# Patient Record
Sex: Female | Born: 1980 | Race: Black or African American | Hispanic: No | Marital: Married | Smoking: Never smoker
Health system: Southern US, Community
[De-identification: ages and names within clinical notes are randomized; demographics above are authoritative.]

## PROBLEM LIST (undated history)

## (undated) DIAGNOSIS — I639 Cerebral infarction, unspecified: Secondary | ICD-10-CM

## (undated) DIAGNOSIS — G43019 Migraine without aura, intractable, without status migrainosus: Secondary | ICD-10-CM

## (undated) DIAGNOSIS — E785 Hyperlipidemia, unspecified: Secondary | ICD-10-CM

## (undated) DIAGNOSIS — T7840XA Allergy, unspecified, initial encounter: Secondary | ICD-10-CM

## (undated) DIAGNOSIS — F419 Anxiety disorder, unspecified: Secondary | ICD-10-CM

## (undated) DIAGNOSIS — E559 Vitamin D deficiency, unspecified: Secondary | ICD-10-CM

## (undated) HISTORY — DX: Vitamin D deficiency, unspecified: E55.9

## (undated) HISTORY — DX: Anxiety disorder, unspecified: F41.9

## (undated) HISTORY — DX: Hyperlipidemia, unspecified: E78.5

## (undated) HISTORY — PX: TUBAL LIGATION: SHX77

## (undated) HISTORY — DX: Allergy, unspecified, initial encounter: T78.40XA

## (undated) HISTORY — DX: Migraine without aura, intractable, without status migrainosus: G43.019

## (undated) HISTORY — DX: Cerebral infarction, unspecified: I63.9

---

## 2004-03-08 ENCOUNTER — Emergency Department (HOSPITAL_COMMUNITY): Admission: EM | Admit: 2004-03-08 | Discharge: 2004-03-09 | Payer: Self-pay | Admitting: *Deleted

## 2010-04-17 ENCOUNTER — Emergency Department (HOSPITAL_COMMUNITY)
Admission: EM | Admit: 2010-04-17 | Discharge: 2010-04-17 | Disposition: A | Discharge status: Home / Self Care | Payer: Medicaid Other | Attending: Emergency Medicine | Admitting: Emergency Medicine

## 2010-04-17 DIAGNOSIS — K5289 Other specified noninfective gastroenteritis and colitis: Secondary | ICD-10-CM | POA: Insufficient documentation

## 2010-04-17 DIAGNOSIS — E860 Dehydration: Secondary | ICD-10-CM | POA: Insufficient documentation

## 2010-04-17 DIAGNOSIS — R197 Diarrhea, unspecified: Secondary | ICD-10-CM | POA: Insufficient documentation

## 2010-04-17 DIAGNOSIS — R112 Nausea with vomiting, unspecified: Secondary | ICD-10-CM | POA: Insufficient documentation

## 2010-04-17 DIAGNOSIS — B9789 Other viral agents as the cause of diseases classified elsewhere: Secondary | ICD-10-CM | POA: Insufficient documentation

## 2010-04-17 LAB — DIFFERENTIAL
Basophils Relative: 0 % (ref 0–1)
Eosinophils Absolute: 0 10*3/uL (ref 0.0–0.7)
Eosinophils Relative: 0 % (ref 0–5)
Monocytes Absolute: 0.4 10*3/uL (ref 0.1–1.0)
Monocytes Relative: 3 % (ref 3–12)
Neutrophils Relative %: 93 % — ABNORMAL HIGH (ref 43–77)

## 2010-04-17 LAB — COMPREHENSIVE METABOLIC PANEL
Albumin: 4 g/dL (ref 3.5–5.2)
BUN: 13 mg/dL (ref 6–23)
Creatinine, Ser: 0.79 mg/dL (ref 0.4–1.2)
Total Protein: 7.1 g/dL (ref 6.0–8.3)

## 2010-04-17 LAB — URINALYSIS, ROUTINE W REFLEX MICROSCOPIC
Glucose, UA: NEGATIVE mg/dL
Hgb urine dipstick: NEGATIVE
Specific Gravity, Urine: 1.02 (ref 1.005–1.030)
Urobilinogen, UA: 0.2 mg/dL (ref 0.0–1.0)

## 2010-04-17 LAB — CBC
MCH: 31.2 pg (ref 26.0–34.0)
MCHC: 34.2 g/dL (ref 30.0–36.0)
Platelets: 197 10*3/uL (ref 150–400)
RDW: 14.4 % (ref 11.5–15.5)

## 2010-04-17 LAB — POCT PREGNANCY, URINE: Preg Test, Ur: NEGATIVE

## 2010-06-23 ENCOUNTER — Emergency Department (HOSPITAL_COMMUNITY)
Admission: EM | Admit: 2010-06-23 | Discharge: 2010-06-23 | Disposition: A | Discharge status: Home / Self Care | Payer: Medicaid Other | Attending: Emergency Medicine | Admitting: Emergency Medicine

## 2010-06-23 ENCOUNTER — Emergency Department (HOSPITAL_COMMUNITY): Payer: Medicaid Other

## 2010-06-23 DIAGNOSIS — M549 Dorsalgia, unspecified: Secondary | ICD-10-CM | POA: Insufficient documentation

## 2010-06-23 DIAGNOSIS — O99891 Other specified diseases and conditions complicating pregnancy: Secondary | ICD-10-CM | POA: Insufficient documentation

## 2010-06-23 LAB — HCG, SERUM, QUALITATIVE: Preg, Serum: POSITIVE — AB

## 2010-06-23 LAB — HCG, QUANTITATIVE, PREGNANCY: hCG, Beta Chain, Quant, S: 12355 m[IU]/mL — ABNORMAL HIGH (ref <5)

## 2010-06-23 LAB — CBC
MCV: 91 fL (ref 78.0–100.0)
Platelets: 189 10*3/uL (ref 150–400)
RBC: 3.78 MIL/uL — ABNORMAL LOW (ref 3.87–5.11)
RDW: 13 % (ref 11.5–15.5)
WBC: 5.2 10*3/uL (ref 4.0–10.5)

## 2010-06-23 LAB — URINALYSIS, ROUTINE W REFLEX MICROSCOPIC
Glucose, UA: NEGATIVE mg/dL
Nitrite: NEGATIVE
Protein, ur: NEGATIVE mg/dL
pH: 8 (ref 5.0–8.0)

## 2010-06-23 LAB — WET PREP, GENITAL
Trich, Wet Prep: NONE SEEN
Yeast Wet Prep HPF POC: NONE SEEN

## 2010-06-23 LAB — DIFFERENTIAL
Basophils Absolute: 0 10*3/uL (ref 0.0–0.1)
Eosinophils Absolute: 0.1 10*3/uL (ref 0.0–0.7)
Lymphs Abs: 2 10*3/uL (ref 0.7–4.0)
Neutrophils Relative %: 53 % (ref 43–77)

## 2010-06-23 IMAGING — US US OB TRANSVAGINAL
1 series · 14 of 28 positions shown · non-contrast
Comparison: None.

CLINICAL DATA: Back pain.  IUP. Intrauterine pregnancy.

OBSTETRIC <14 WK US AND TRANSVAGINAL OB US
TECHNIQUE: Both transabdominal and transvaginal ultrasound
examinations were performed for complete evaluation of the
gestation as well as the maternal uterus, adnexal regions, and
pelvic cul-de-sac.  Transvaginal technique was performed to assess
early pregnancy.

[Series 1: us ob transvaginal · 0.21mm/px · 14 of 87 slices shown]
[im 4/87]
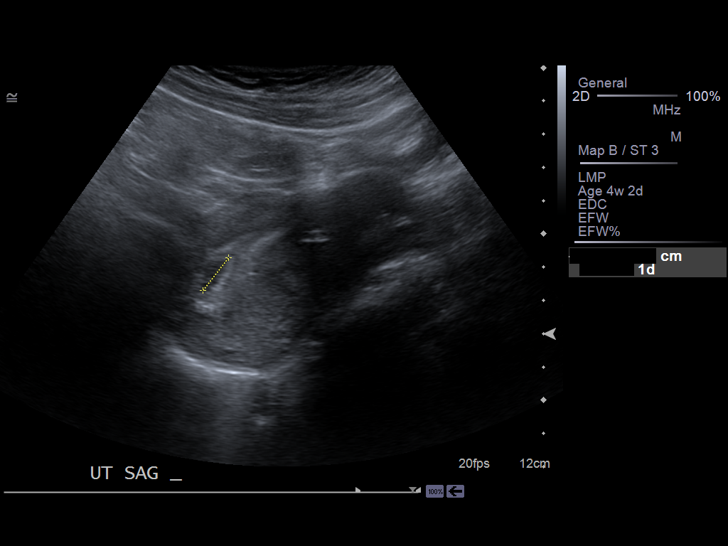
[im 10/87]
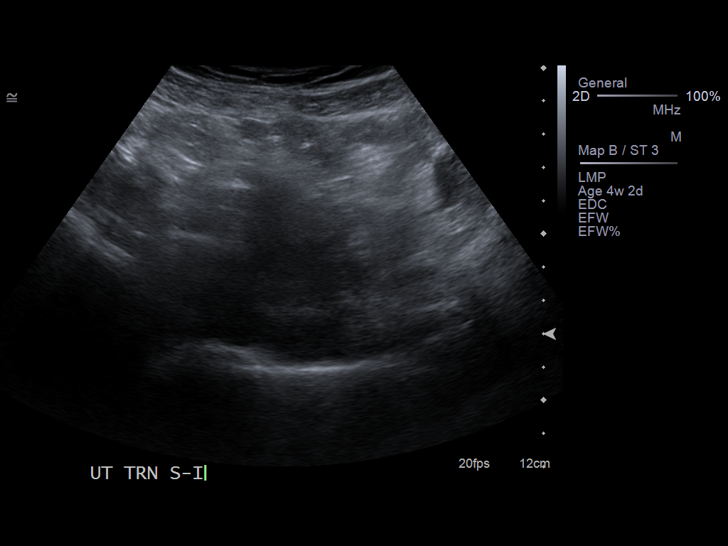
[im 16/87]
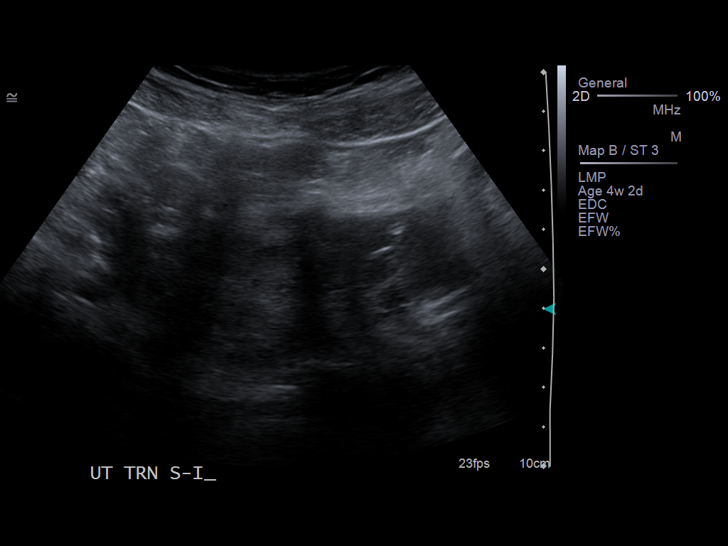
[im 23/87]
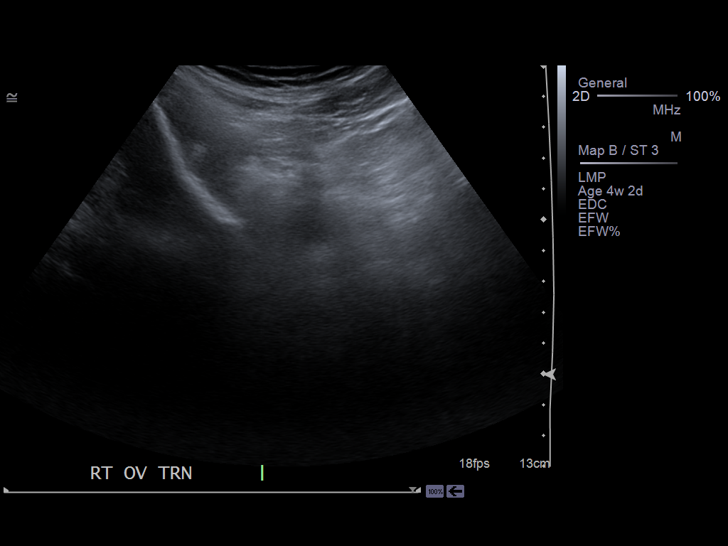
[im 29/87]
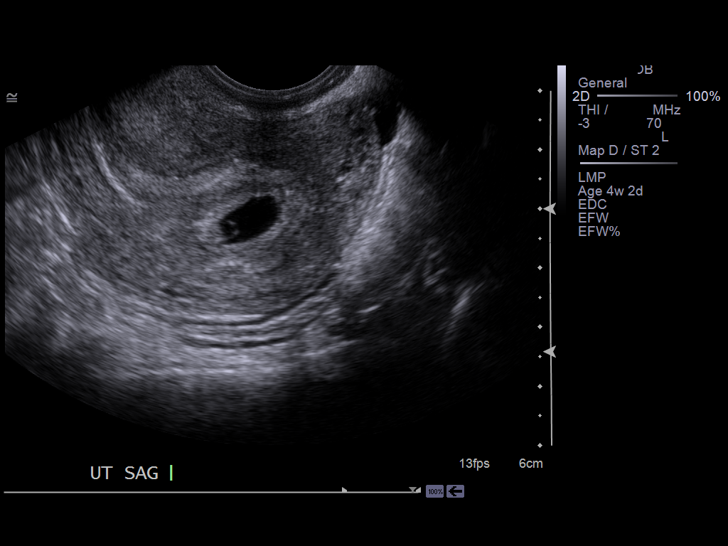
[im 36/87]
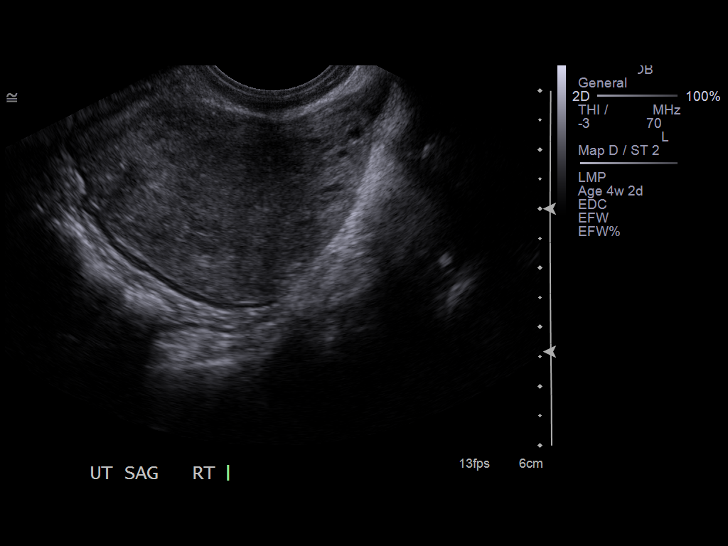
[im 42/87]
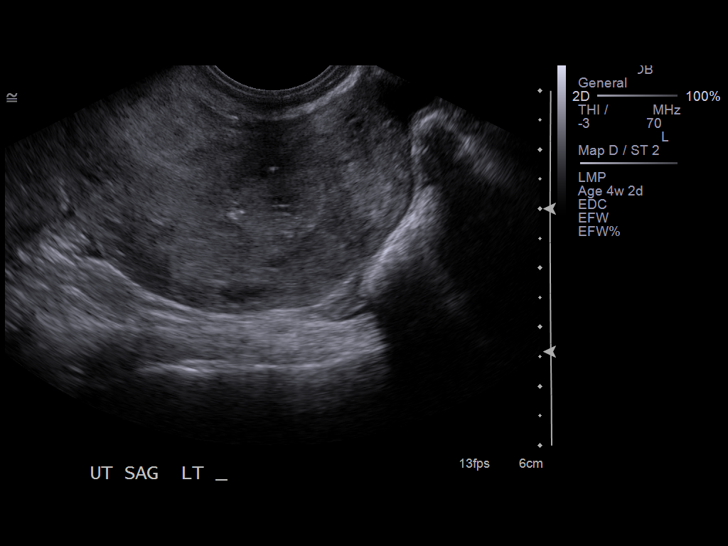
[im 48/87]
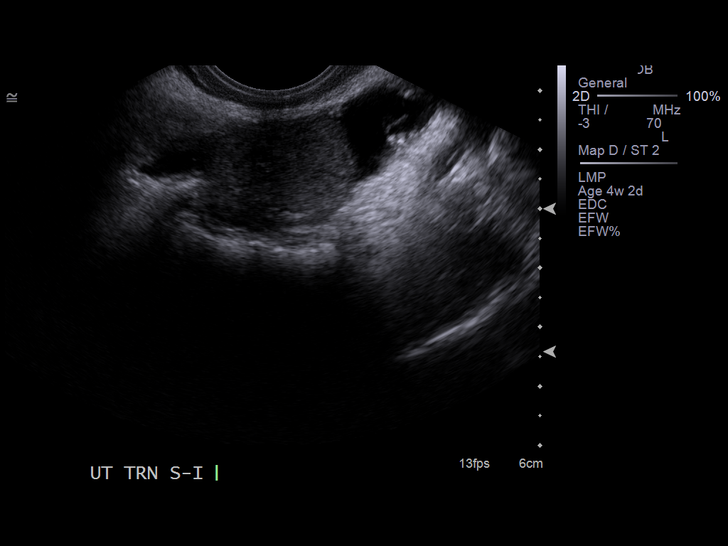
[im 55/87]
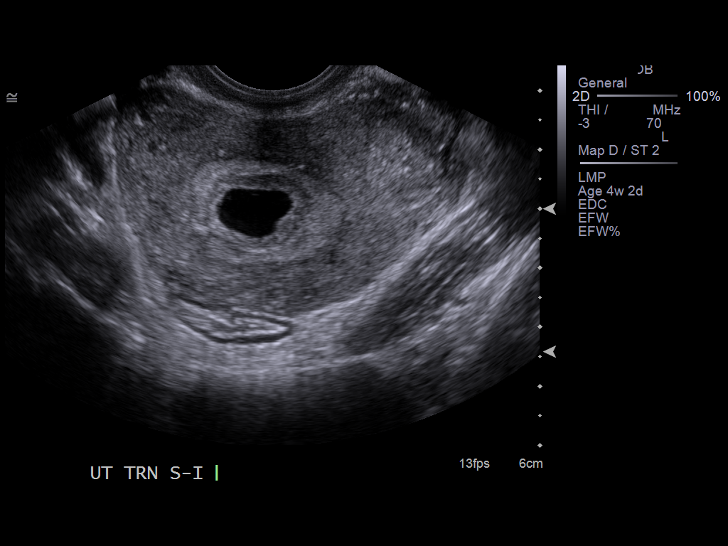
[im 61/87]
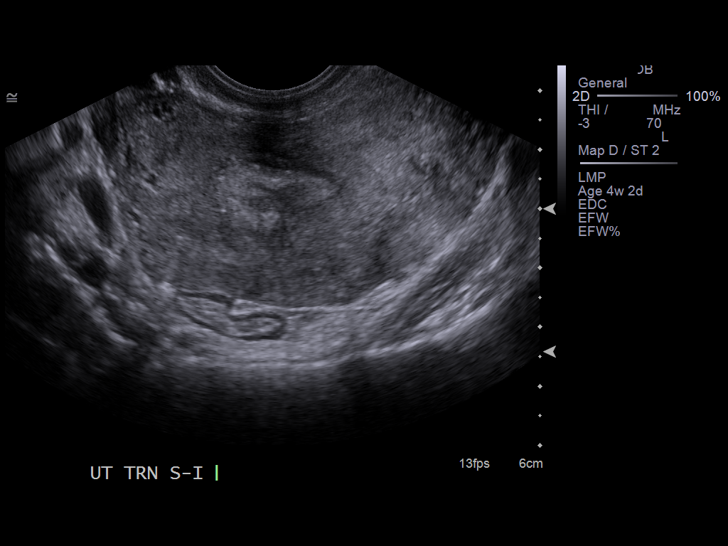
[im 67/87]
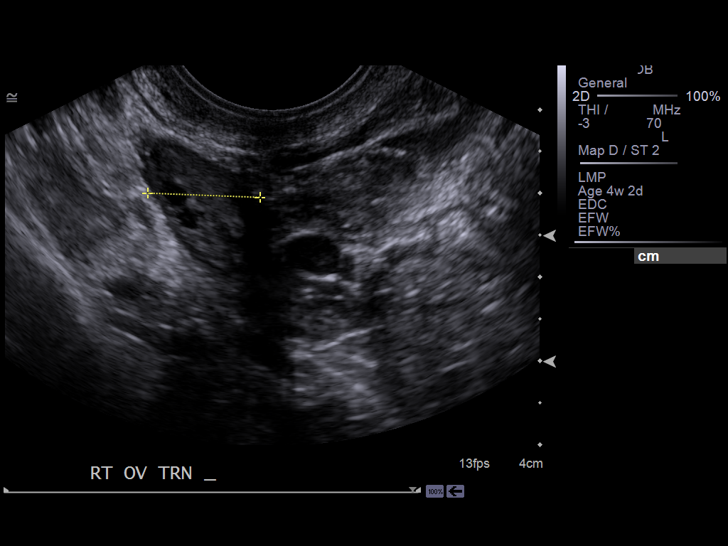
[im 74/87]
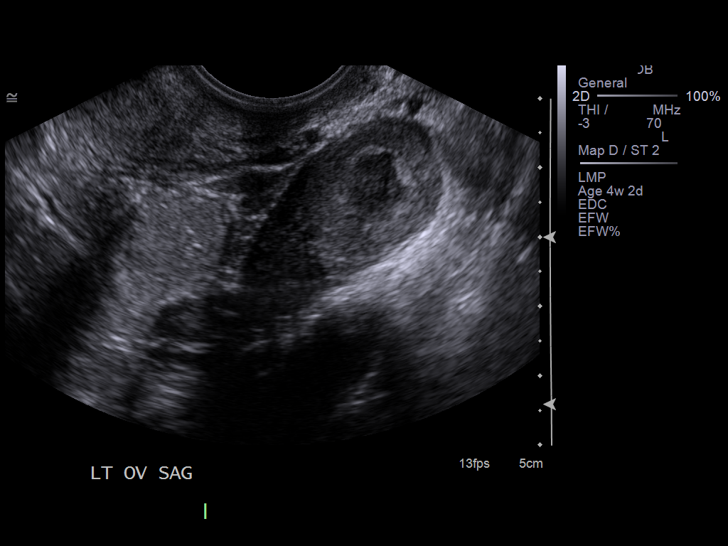
[im 80/87]
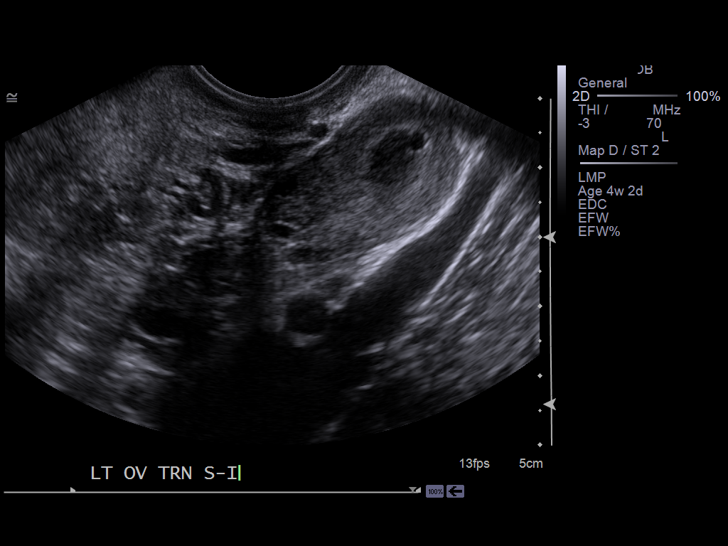
[im 87/87]
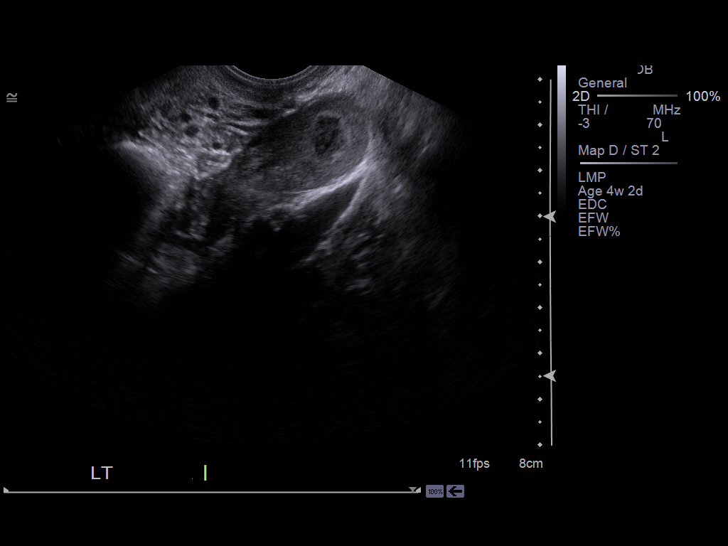

[14 of 28 positions shown; findings below may reference images not displayed]

Intrauterine gestational sac:  Visualized/normal in shape.
Yolk sac: Present
Embryo: Not visualized
Cardiac Activity: Not visualized
Heart Rate: Not applicable bpm

MSD: 1.2 cm.  mm  6    w zero    d
CRL:    mm     w     d        US EDC: [DATE]

Maternal uterus/adnexae:
Right ovary measures 26 mm x 10 mm x 13 mm with normal color flow.
Left ovary measures 32 mm x 19 mm x 27 mm.  Corpus luteum cyst
noted measuring 12 mm.

Small amount of free fluid present in the anatomic pelvis.
IMPRESSION: Intrauterine gestational sac with yolk sac present compatible with
early intrauterine pregnancy.  Differential considerations include
early normal or abnormal IUP.

## 2010-06-24 LAB — GC/CHLAMYDIA PROBE AMP, GENITAL
Chlamydia, DNA Probe: NEGATIVE
GC Probe Amp, Genital: NEGATIVE

## 2011-01-07 ENCOUNTER — Emergency Department (HOSPITAL_COMMUNITY): Payer: Medicaid Other

## 2011-01-07 ENCOUNTER — Encounter: Payer: Self-pay | Admitting: *Deleted

## 2011-01-07 ENCOUNTER — Emergency Department (HOSPITAL_COMMUNITY)
Admission: EM | Admit: 2011-01-07 | Discharge: 2011-01-07 | Disposition: A | Discharge status: Home / Self Care | Payer: Medicaid Other | Attending: Emergency Medicine | Admitting: Emergency Medicine

## 2011-01-07 DIAGNOSIS — O99891 Other specified diseases and conditions complicating pregnancy: Secondary | ICD-10-CM | POA: Insufficient documentation

## 2011-01-07 DIAGNOSIS — R509 Fever, unspecified: Secondary | ICD-10-CM | POA: Insufficient documentation

## 2011-01-07 DIAGNOSIS — R05 Cough: Secondary | ICD-10-CM | POA: Insufficient documentation

## 2011-01-07 DIAGNOSIS — J111 Influenza due to unidentified influenza virus with other respiratory manifestations: Secondary | ICD-10-CM | POA: Insufficient documentation

## 2011-01-07 DIAGNOSIS — IMO0001 Reserved for inherently not codable concepts without codable children: Secondary | ICD-10-CM | POA: Insufficient documentation

## 2011-01-07 DIAGNOSIS — R110 Nausea: Secondary | ICD-10-CM | POA: Insufficient documentation

## 2011-01-07 DIAGNOSIS — R059 Cough, unspecified: Secondary | ICD-10-CM | POA: Insufficient documentation

## 2011-01-07 DIAGNOSIS — R6889 Other general symptoms and signs: Secondary | ICD-10-CM | POA: Insufficient documentation

## 2011-01-07 IMAGING — CR DG CHEST 2V
2 series · 2 of 2 positions shown · non-contrast
Comparison: None.

CLINICAL DATA: Fever, cough.

CHEST - 2 VIEW

[view not recorded (1 of 2)]
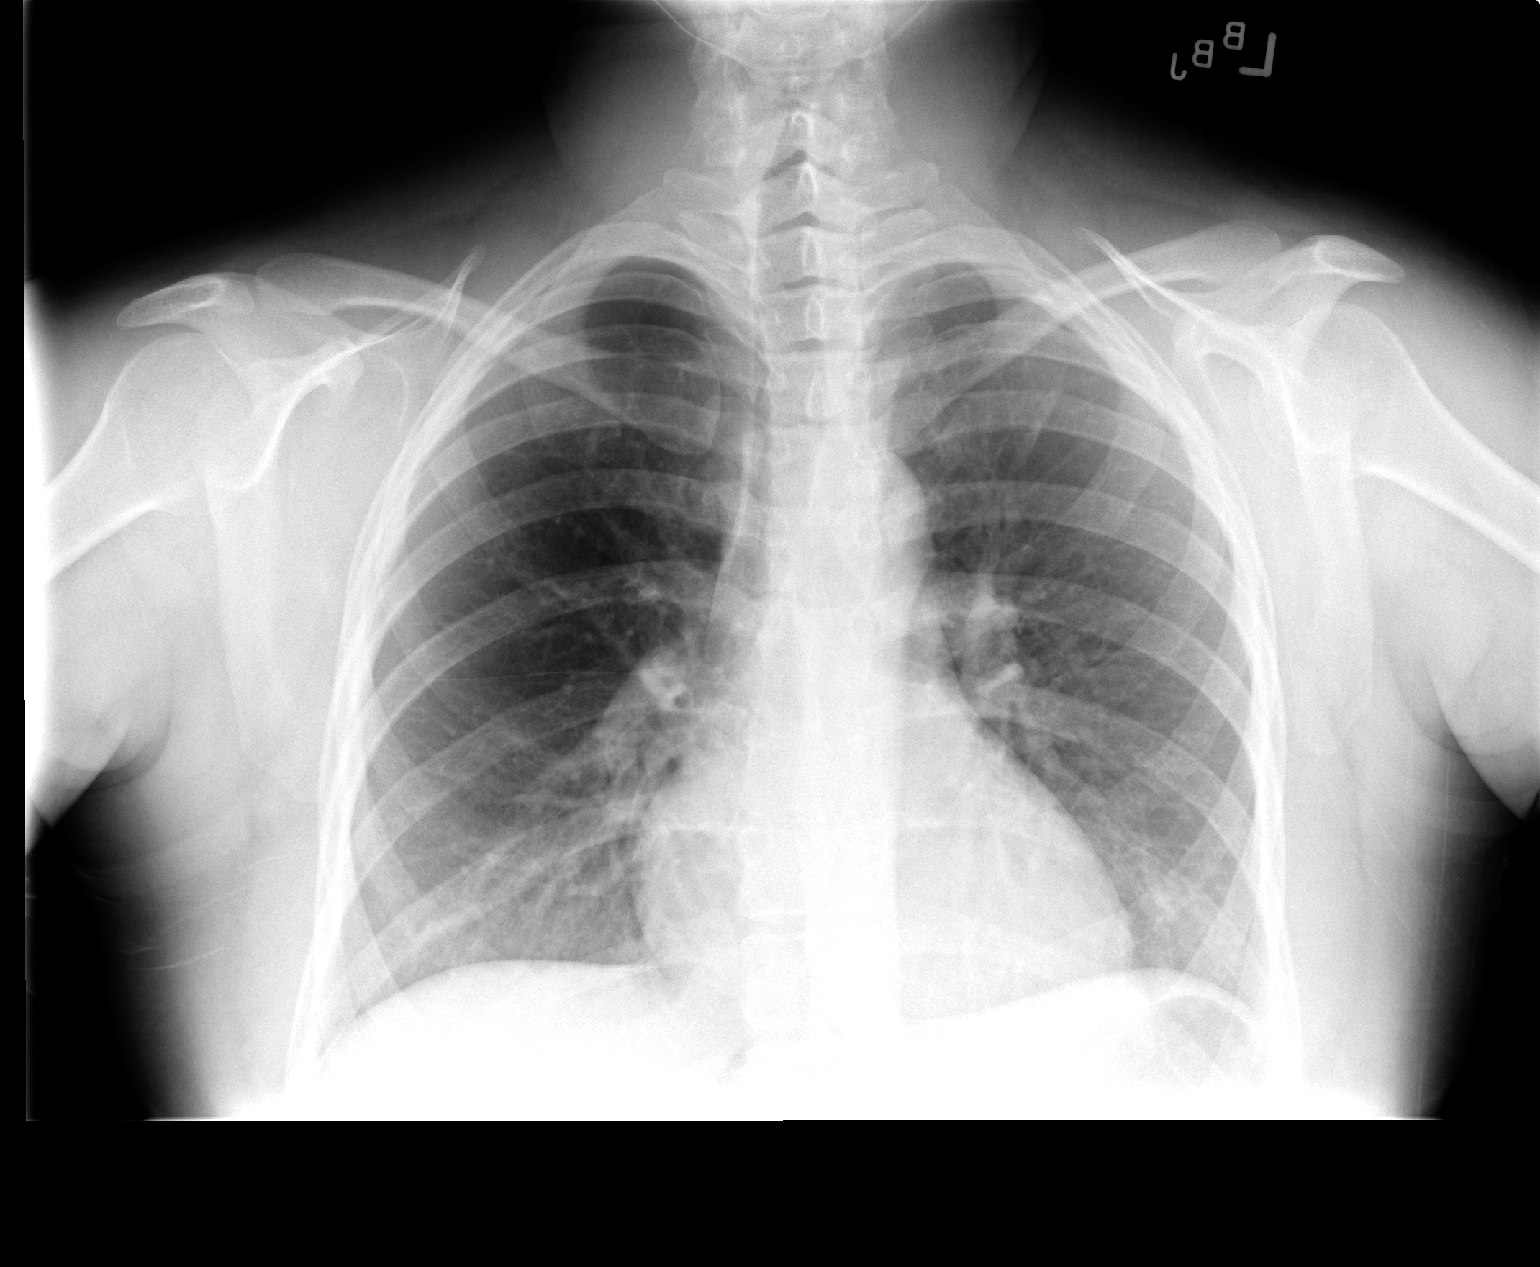

[view not recorded (2 of 2)]
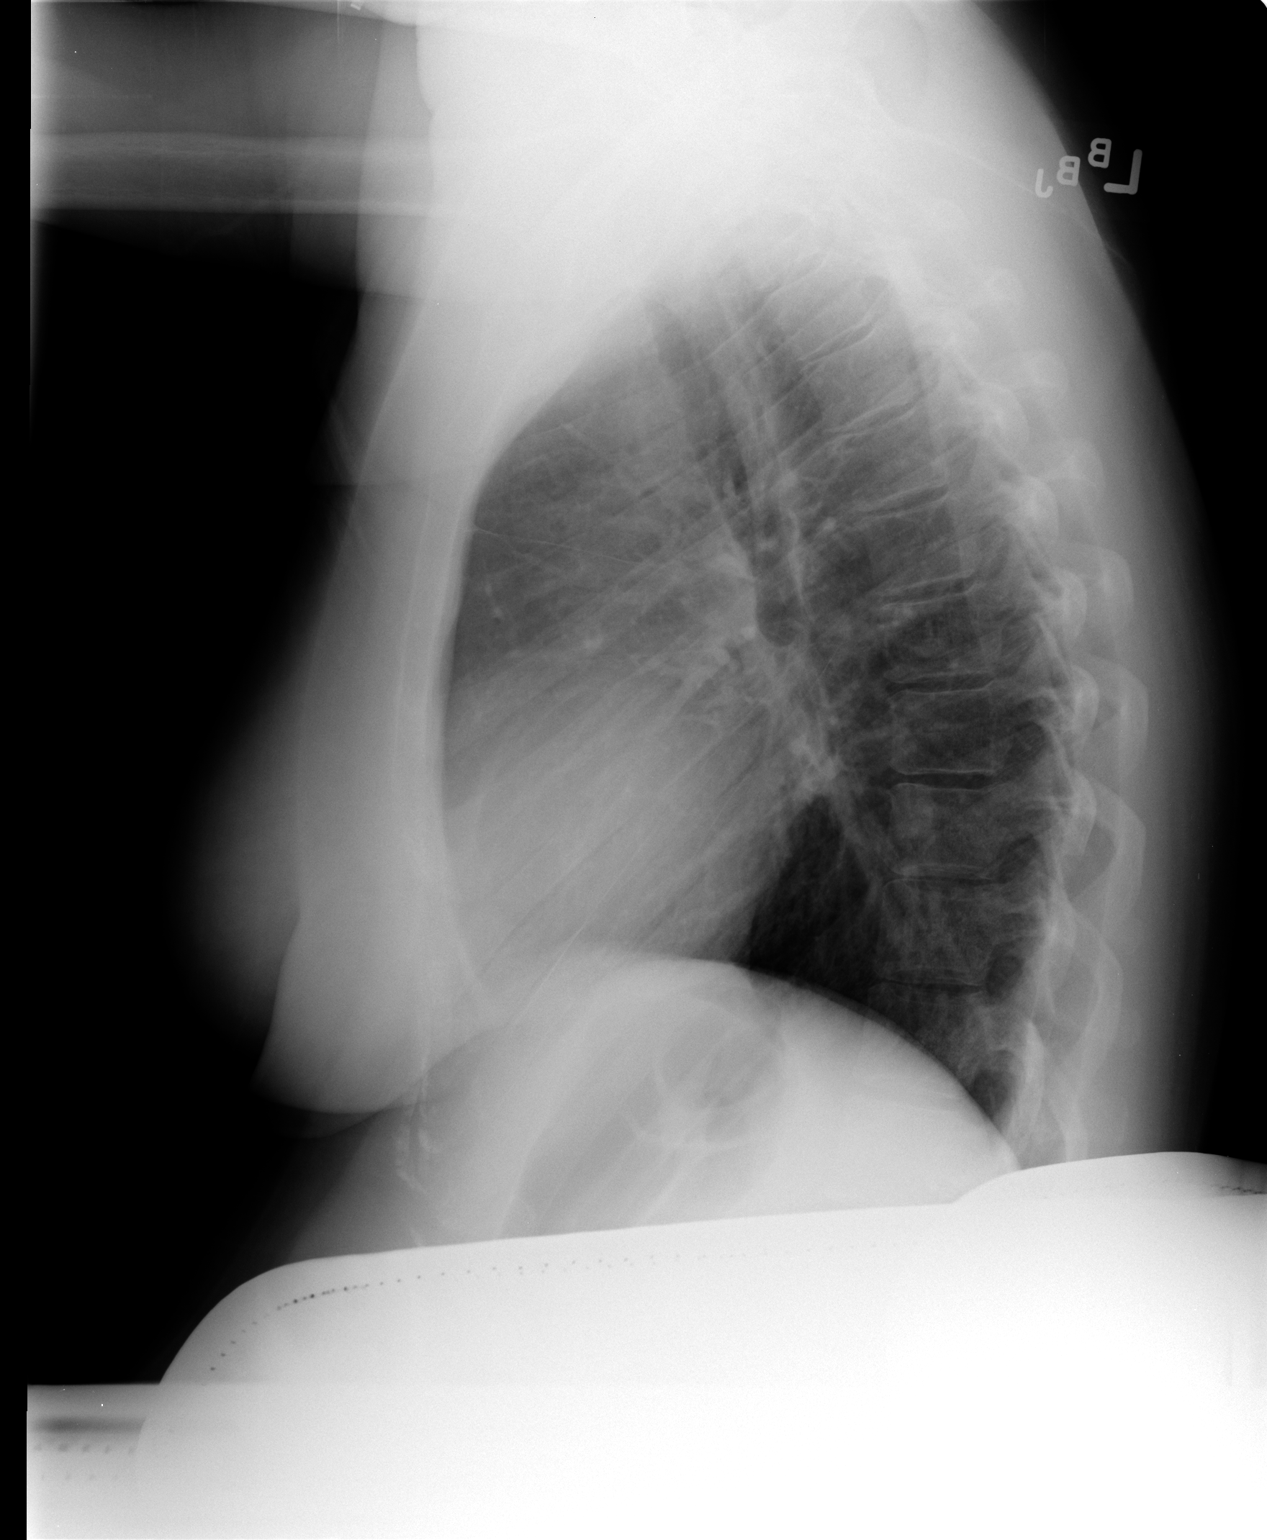

[2 of 2 positions shown; findings below may reference images not displayed]

FINDINGS: Lungs are clear. No pleural effusion or pneumothorax. The
cardiomediastinal contours are within normal limits. The visualized
bones and soft tissues are without significant appreciable
abnormality.
IMPRESSION: No acute cardiopulmonary process.

## 2011-01-07 MED ORDER — OSELTAMIVIR PHOSPHATE 75 MG PO CAPS
75.0000 mg | ORAL_CAPSULE | Freq: Once | ORAL | Status: AC
  Administered 2011-01-07: 75 mg via ORAL
  Filled 2011-01-07: qty 1

## 2011-01-07 MED ORDER — OSELTAMIVIR PHOSPHATE 75 MG PO CAPS: 75.0000 mg | ORAL_CAPSULE | Freq: Two times a day (BID) | ORAL | Status: AC

## 2011-01-07 NOTE — ED Provider Notes (Signed)
History     CSN: [id number] Arrival date & time: 01/07/2011  2:47 AM   First MD Initiated Contact with Patient 01/07/11 0303      Chief Complaint  Patient presents with  . Generalized Body Aches  . Fever  . Cough    (Consider location/radiation/quality/duration/timing/severity/associated sxs/prior treatment) HPI  Patient relates she's G3 P2 EDC January 21. She relates she's had a normal pregnancy so far. [place]N [place] in [place]. She relates 2 weeks ago she had a sinus infection and was treated with amoxicillin got better yesterday however she started having diffuse body aches with coughing, sneezing, fever to 101. She denies sore throat vomiting diarrhea abdominal pain. She has nausea and chills. She relates no one else is sick. She denies vaginal spotting or bleeding.  Gynecologist Dr. Donzetta Matters in [place]  History reviewed. No pertinent past medical history.  History reviewed. No pertinent past surgical history.  History reviewed. No pertinent family history.  History  Substance Use Topics  . Smoking status: Never Smoker   . Smokeless tobacco: Not on file  . Alcohol Use: No   unemployed  OB History    Grav Para Term Preterm Abortions TAB SAB Ect Mult Living   1               Review of Systems  All other systems reviewed and are negative.    Allergies  Review of patient's allergies indicates no known allergies.  Home Medications   Current Outpatient Rx  Name Route Sig Dispense Refill  . AMOXICILLIN 500 MG PO CAPS Oral Take 500 mg by mouth 3 (three) times daily.      Marland Kitchen PRENATAL MULTIVITAMIN CH Oral Take 1 tablet by mouth daily.      . OSELTAMIVIR PHOSPHATE 75 MG PO CAPS Oral Take 1 capsule (75 mg total) by mouth every 12 (twelve) hours. 10 capsule 0    BP 93/58  Pulse 98  Temp(Src) 98.9 F (37.2 C) (Oral)  Resp 22  Ht 5\' 2"  (1.575 m)  Wt 190 lb (86.183 kg)  BMI 34.75 kg/m2  SpO2 97% Vital signs hypotension otherwise normal  Physical Exam    Constitutional: She is oriented to person, place, and time. She appears well-developed and well-nourished.  Non-toxic appearance. She does not appear ill. No distress.       Patient looks like she feels bad  HENT:  Head: Normocephalic and atraumatic.  Right Ear: External ear normal.  Left Ear: External ear normal.  Nose: Nose normal. No mucosal edema or rhinorrhea.  Mouth/Throat: Oropharynx is clear and moist and mucous membranes are normal. No dental abscesses or uvula swelling.  Eyes: Conjunctivae and EOM are normal. Pupils are equal, round, and reactive to light.  Neck: Normal range of motion and full passive range of motion without pain. Neck supple.  Cardiovascular: Normal rate, regular rhythm and normal heart sounds.  Exam reveals no gallop and no friction rub.   No murmur heard. Pulmonary/Chest: Effort normal and breath sounds normal. No respiratory distress. She has no wheezes. She has no rhonchi. She has no rales. She exhibits no tenderness and no crepitus.  Abdominal: Soft. Normal appearance and bowel sounds are normal. She exhibits no distension. There is no tenderness. There is no rebound and no guarding.       Abdomen gravid consistent with dates. Fetal heart tones 138  Musculoskeletal: Normal range of motion. She exhibits no edema and no tenderness.       Moves all extremities  well.   Neurological: She is alert and oriented to person, place, and time. She has normal strength. No cranial nerve deficit.  Skin: Skin is warm, dry and intact. No rash noted. No erythema. No pallor.       Skin is hot to touch  Psychiatric: She has a normal mood and affect. Her speech is normal and behavior is normal. Her mood appears not anxious.    ED Course  Procedures (including critical care time)  Patient started on Tamiflu for her influenza-like symptoms  Labs Reviewed - No data to display Dg Chest 2 View  01/07/2011  *RADIOLOGY REPORT*  Clinical Data: Fever, cough.  CHEST - 2 VIEW   Comparison: None.  Findings: Lungs are clear. No pleural effusion or pneumothorax. The cardiomediastinal contours are within normal limits. The visualized bones and soft tissues are without significant appreciable abnormality.  IMPRESSION: No acute cardiopulmonary process.  Original Report Authenticated By: Waneta Martins, M.D.     1. Influenza   Pregnancy  New Prescriptions   OSELTAMIVIR (TAMIFLU) 75 MG CAPSULE    Take 1 capsule (75 mg total) by mouth every 12 (twelve) hours.   Plan discharge Devoria Albe, MD, FACEP     MDM          Ward Givens, MD 01/07/11 [phone]

## 2011-01-07 NOTE — ED Notes (Addendum)
Reports cough, sneezing, body aches, chills, fever.  States symptoms started yesterday.  Pt reports being 8 months pregnant.  Denies problems with pregnancy at this time.  Reports being seen by [facility] in [place] last week and diagnosed with sinus infection.  States she has been taking amoxicillin to treat.

## 2011-01-07 NOTE — ED Notes (Signed)
Fetal heart tones, strong and regular. Rate is 138.

## 2012-07-31 ENCOUNTER — Emergency Department (HOSPITAL_COMMUNITY): Payer: Self-pay

## 2012-07-31 ENCOUNTER — Emergency Department (HOSPITAL_COMMUNITY)
Admission: EM | Admit: 2012-07-31 | Discharge: 2012-07-31 | Disposition: A | Discharge status: Home / Self Care | Payer: Self-pay | Attending: Emergency Medicine | Admitting: Emergency Medicine

## 2012-07-31 ENCOUNTER — Encounter (HOSPITAL_COMMUNITY): Payer: Self-pay

## 2012-07-31 DIAGNOSIS — R209 Unspecified disturbances of skin sensation: Secondary | ICD-10-CM | POA: Insufficient documentation

## 2012-07-31 DIAGNOSIS — R200 Anesthesia of skin: Secondary | ICD-10-CM

## 2012-07-31 DIAGNOSIS — Z79899 Other long term (current) drug therapy: Secondary | ICD-10-CM | POA: Insufficient documentation

## 2012-07-31 DIAGNOSIS — R51 Headache: Secondary | ICD-10-CM | POA: Insufficient documentation

## 2012-07-31 DIAGNOSIS — Z3202 Encounter for pregnancy test, result negative: Secondary | ICD-10-CM | POA: Insufficient documentation

## 2012-07-31 DIAGNOSIS — R519 Headache, unspecified: Secondary | ICD-10-CM

## 2012-07-31 LAB — PREGNANCY, URINE: Preg Test, Ur: NEGATIVE

## 2012-07-31 IMAGING — CT CT HEAD W/O CM
1 of 2 series · 16 of 30 positions shown, 20 images · non-contrast
Comparison: None.

CLINICAL DATA: Left facial numbness.  Headache

CT HEAD WITHOUT CONTRAST
TECHNIQUE: Contiguous axial images were obtained from the base of
the skull through the vertex without contrast

[Series 3: headtrauma 2.4 h60s · axial · 0.43mm/px · z∈[+101,+256]mm · 16 of 72 slices shown, 20 images]
[im 4/72  brain]
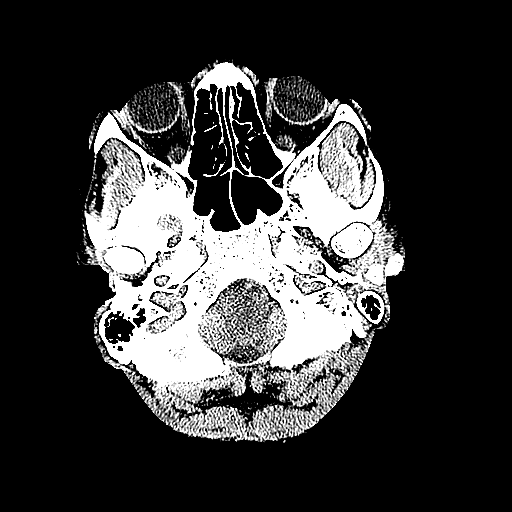
[im 4/72  bone]
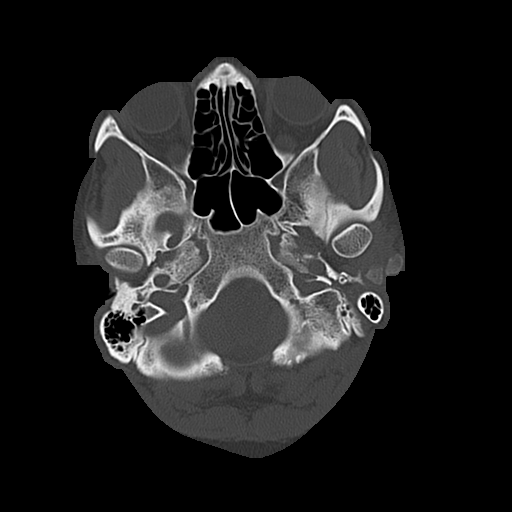
[im 8/72  brain]
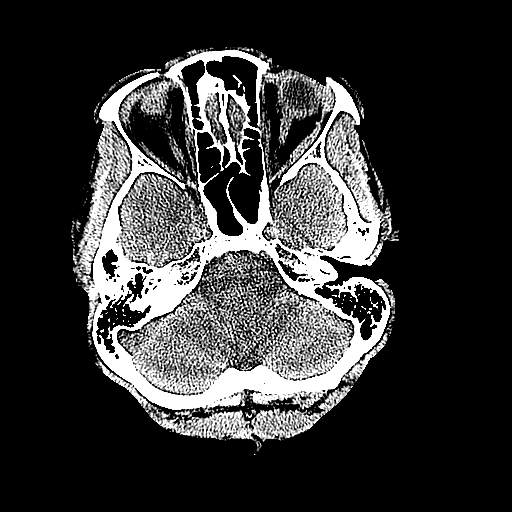
[im 12/72  brain]
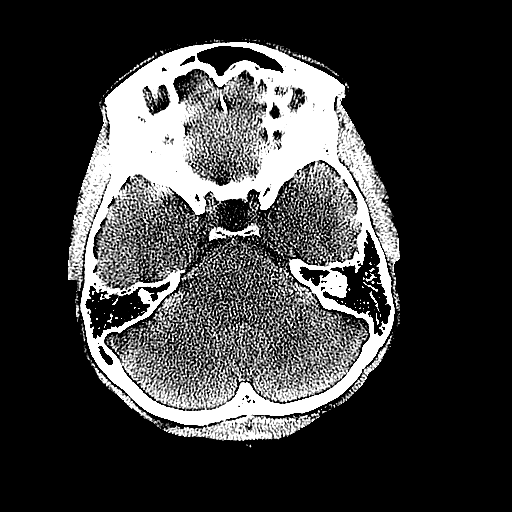
[im 15/72  brain]
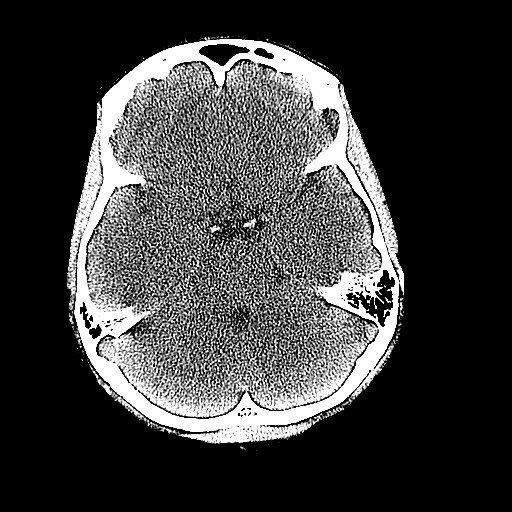
[im 23/72  brain]
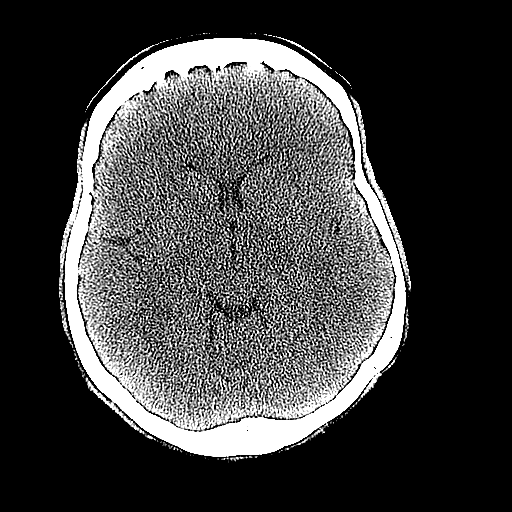
[im 23/72  bone]
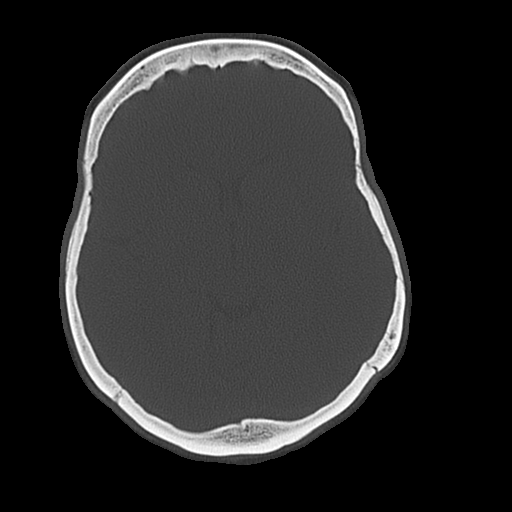
[im 27/72  brain]
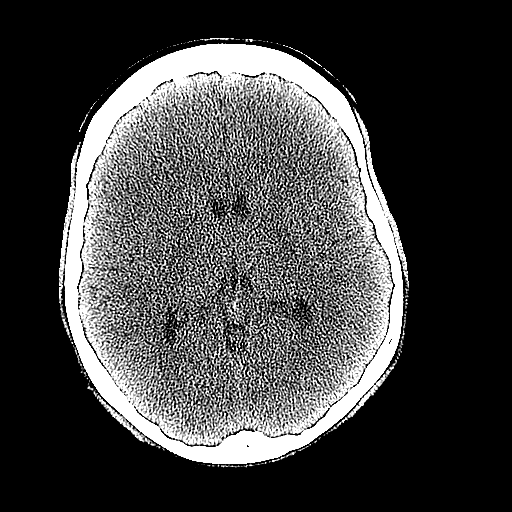
[im 30/72  brain]
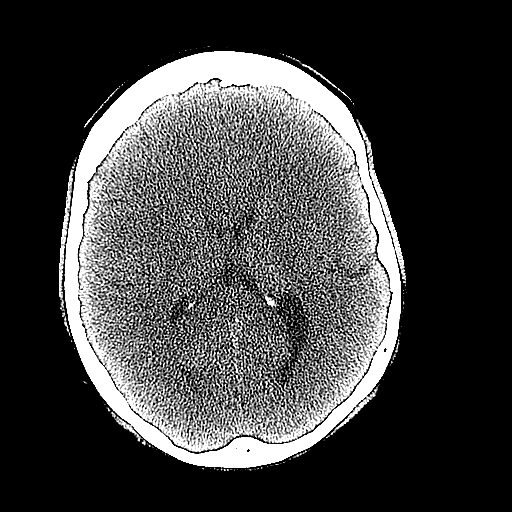
[im 34/72  brain]
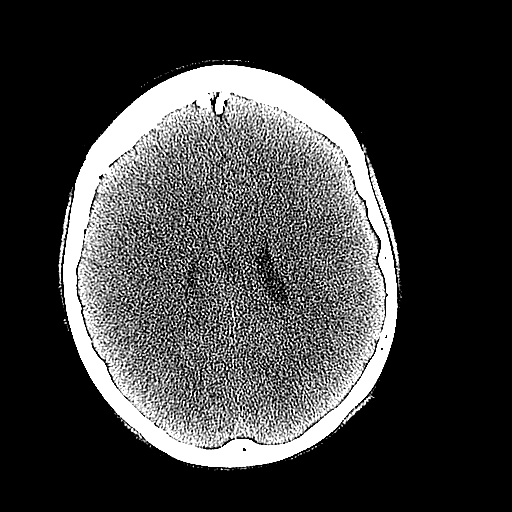
[im 38/72  brain]
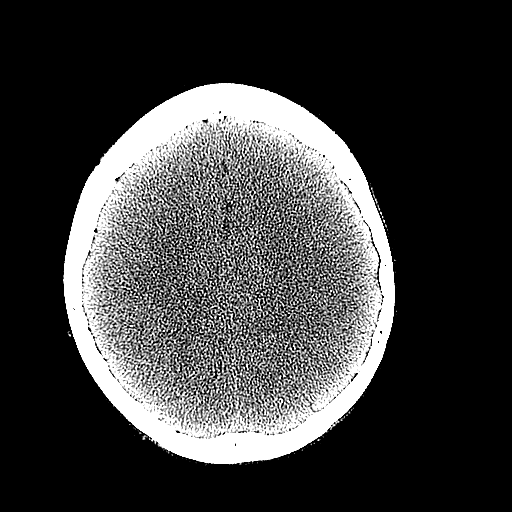
[im 38/72  bone]
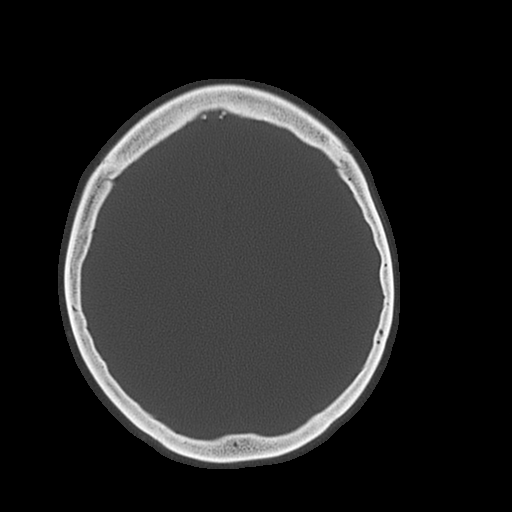
[im 42/72  brain]
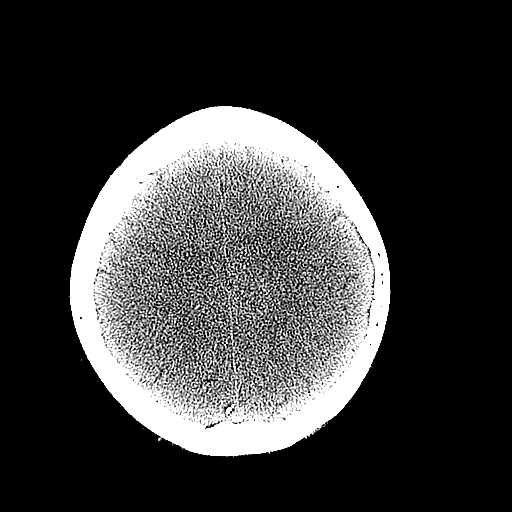
[im 45/72  brain]
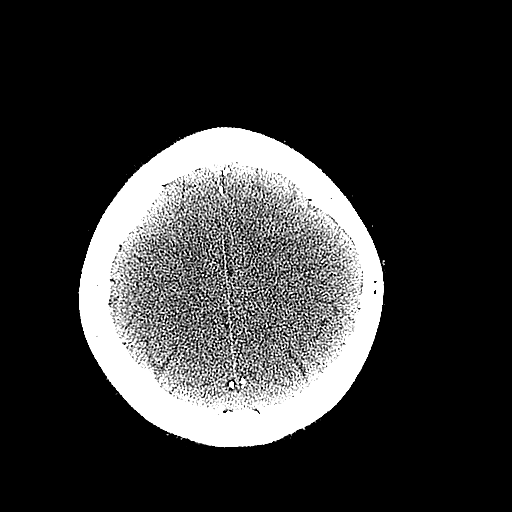
[im 49/72  brain]
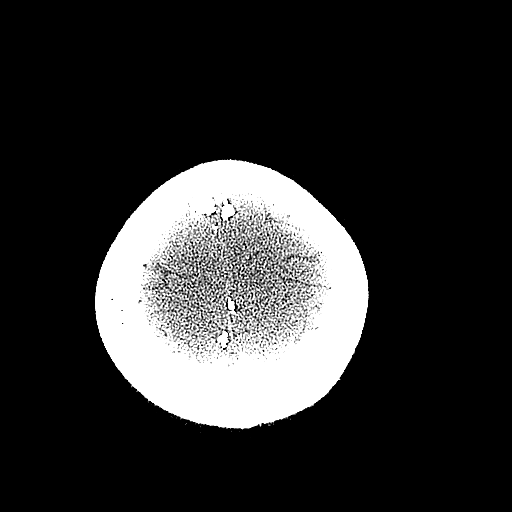
[im 57/72  brain]
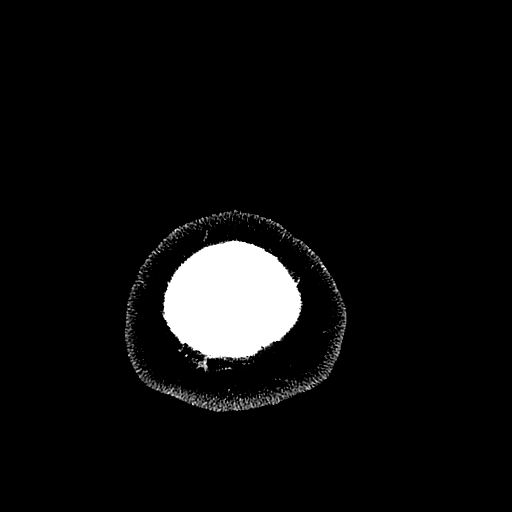
[im 57/72  bone]
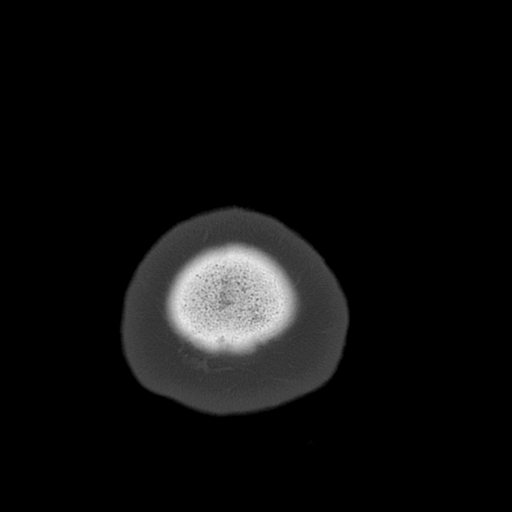
[im 60/72  brain]
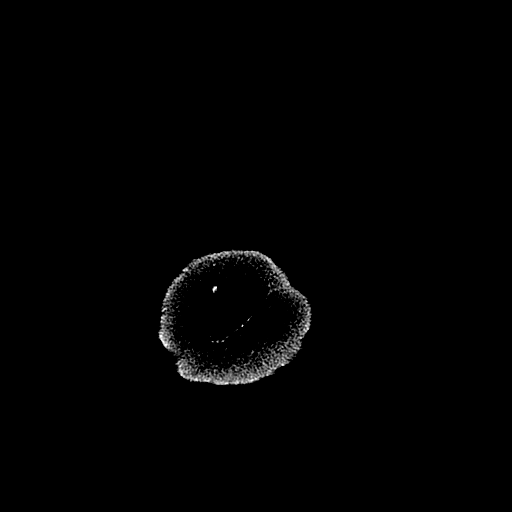
[im 64/72  brain]
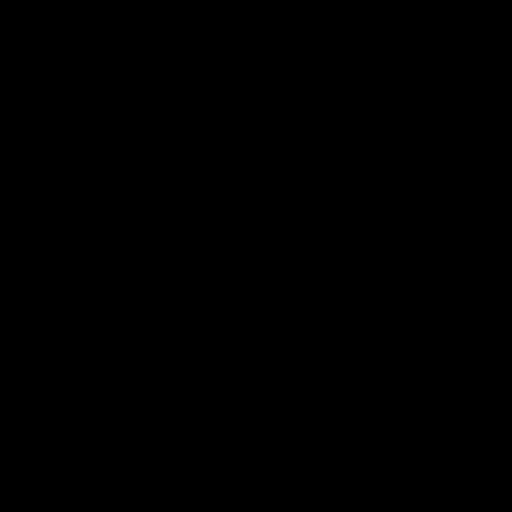
[im 68/72  brain]
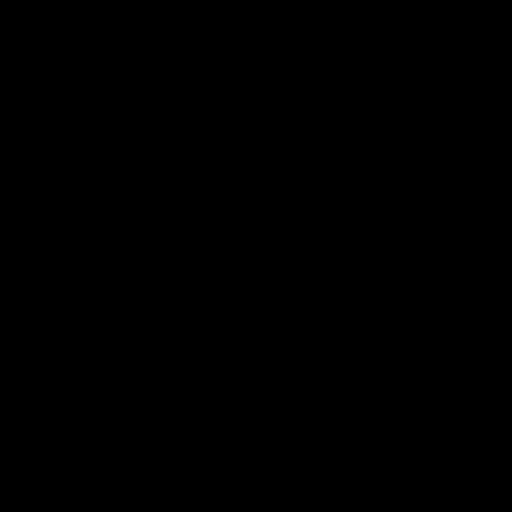

[16 of 30 positions shown; findings below may reference images not displayed]

FINDINGS: The brain has a normal appearance without evidence for
hemorrhage, acute infarction, hydrocephalus, or mass lesion.  There
is no extra axial fluid collection.  The skull and paranasal
sinuses are normal.
IMPRESSION: Normal CT of the head without contrast.

## 2012-07-31 NOTE — ED Notes (Signed)
States that she started having left sided facial "odd sensations" and left arm numbness at midnight last night.  States that she dropped her cell phone from her left hand today.  States that the numbness continues today.  States that she has had a headache for the last 2 weeks.  States that her headache is absent today.  States that she feels week in her left leg as well.

## 2012-07-31 NOTE — ED Notes (Signed)
Pt reports that she got up to go the bathroom last night at 12am, and left left side went numb, side cont. To feel different from the right and weak.   Has been having headaches for weeks.

## 2012-07-31 NOTE — ED Provider Notes (Addendum)
History    CSN: [id number] Arrival date & time 07/31/12  1104  First MD Initiated Contact with Patient 07/31/12 1123     Chief Complaint  Patient presents with  . Numbness  . Headache   (Consider location/radiation/quality/duration/timing/severity/associated sxs/prior Treatment) HPI Comments: 32 yo female with no medical hx, no smoking, no birth control presents with recurrent HA and left sided numbness.  Pt has had dull ache HA, intermittent, gradual onset during the day for a few weeks, no known migraine hx.  Not worse in am.  Last night she developed left facial numbness and left arm numbness around midnight, has been intermittent since.  Possible left arm weakness.  No leg involvement.  No known neuro hx.  Parents did not have stroke at young age but extended family multiple medical issues.  No ha currently.    Patient is a 32 y.o. female presenting with headaches. The history is provided by the patient.  Headache Associated symptoms: numbness   Associated symptoms: no abdominal pain, no back pain, no fever, no neck pain, no neck stiffness and no vomiting    History reviewed. No pertinent past medical history. History reviewed. No pertinent past surgical history. No family history on file. History  Substance Use Topics  . Smoking status: Never Smoker   . Smokeless tobacco: Not on file  . Alcohol Use: No   OB History   Grav Para Term Preterm Abortions TAB SAB Ect Mult Living   1              Review of Systems  Constitutional: Negative for fever and chills.  HENT: Negative for neck pain and neck stiffness.   Eyes: Negative for visual disturbance.  Respiratory: Negative for shortness of breath.   Cardiovascular: Negative for chest pain.  Gastrointestinal: Negative for vomiting and abdominal pain.  Genitourinary: Negative for dysuria.  Musculoskeletal: Negative for back pain.  Skin: Negative for rash.  Neurological: Positive for numbness and headaches. Negative for  syncope and light-headedness.    Allergies  Review of patient's allergies indicates no known allergies.  Home Medications   Current Outpatient Rx  Name  Route  Sig  Dispense  Refill  . amoxicillin (AMOXIL) 500 MG capsule   Oral   Take 500 mg by mouth 3 (three) times daily.           . Prenatal Vit-Fe Fumarate-FA (PRENATAL MULTIVITAMIN) TABS   Oral   Take 1 tablet by mouth daily.            BP 128/84  Pulse 85  Temp(Src) 98.3 F (36.8 C) (Oral)  Resp 20  Ht 5\' 2"  (1.575 m)  Wt 167 lb 4 oz (75.864 kg)  BMI 30.58 kg/m2  SpO2 100%  LMP 07/20/2012  Breastfeeding? Unknown Physical Exam  Nursing note and vitals reviewed. Constitutional: She is oriented to person, place, and time. She appears well-developed and well-nourished.  HENT:  Head: Normocephalic and atraumatic.  Eyes: Conjunctivae are normal. Right eye exhibits no discharge. Left eye exhibits no discharge.  Neck: Normal range of motion. Neck supple. No tracheal deviation present.  Cardiovascular: Normal rate and regular rhythm.   Pulmonary/Chest: Effort normal and breath sounds normal.  Abdominal: Soft. She exhibits no distension.  Musculoskeletal: She exhibits no edema and no tenderness.  Neurological: She is alert and oriented to person, place, and time. No cranial nerve deficit. GCS eye subscore is 4. GCS verbal subscore is 5. GCS motor subscore is 6.  No papilledema right  eye 5+ strength in UE and LE with f/e at major joints. Sensation to palpation intact in UE and LE. CNs 2-12 grossly intact.  EOMFI.  PERRL.   Finger nose and coordination intact bilateral.   Visual fields intact to finger testing.   Skin: Skin is warm. No rash noted.  Psychiatric: She has a normal mood and affect.    ED Course  Procedures (including critical care time) Labs Reviewed  PREGNANCY, URINE   No results found. No diagnosis found.  MDM  Pt very low risk for stroke/ other intracranial dz.  No concern for SAH due to  hpi. Pt has no risk factors for blood clot/ venous thrombosis.  No ha currently.  However with left Arm numbness with intermittent ha, screening CT and re-evalu. CT no acute findings, reviewed.    Date: 07/31/2012  Rate: 70   Rhythm: normal sinus rhythm  QRS Axis: normal  Intervals: normal  ST/T Wave abnormalities: normal  Conduction Disutrbances:none  Narrative Interpretation:   Old EKG Reviewed: none available  Normal neuro exam, very low risk patient, close fup outpt discussed.  Paged neuro for fup early this week.  Return reasons discussed.  EKG personally reviewed. Enid Skeens, MD 07/31/12 1247  Enid Skeens, MD 09/23/12 [phone]

## 2013-03-19 ENCOUNTER — Encounter (HOSPITAL_COMMUNITY): Payer: Self-pay | Admitting: Emergency Medicine

## 2013-03-19 ENCOUNTER — Emergency Department (HOSPITAL_COMMUNITY)
Admission: EM | Admit: 2013-03-19 | Discharge: 2013-03-19 | Disposition: A | Discharge status: Home / Self Care | Payer: Self-pay | Attending: Emergency Medicine | Admitting: Emergency Medicine

## 2013-03-19 DIAGNOSIS — Z792 Long term (current) use of antibiotics: Secondary | ICD-10-CM | POA: Insufficient documentation

## 2013-03-19 DIAGNOSIS — R51 Headache: Secondary | ICD-10-CM | POA: Insufficient documentation

## 2013-03-19 DIAGNOSIS — H109 Unspecified conjunctivitis: Secondary | ICD-10-CM | POA: Insufficient documentation

## 2013-03-19 MED ORDER — TOBRAMYCIN 0.3 % OP SOLN
2.0000 [drp] | Freq: Once | OPHTHALMIC | Status: AC
  Administered 2013-03-19: 2 [drp] via OPHTHALMIC
  Filled 2013-03-19: qty 5

## 2013-03-19 NOTE — ED Provider Notes (Signed)
  Medical screening examination/treatment/procedure(s) were performed by non-physician practitioner and as supervising physician I was immediately available for consultation/collaboration.     Gerhard Munchobert Narelle Schoening, MD 03/19/13 1011

## 2013-03-19 NOTE — ED Provider Notes (Signed)
CSN: [id number]     Arrival date & time 03/19/13  7829 History   First MD Initiated Contact with Patient 03/19/13 [phone]     Chief Complaint  Patient presents with  . Eye Problem     (Consider location/radiation/quality/duration/timing/severity/associated sxs/prior Treatment) Patient is a 33 y.o. female presenting with eye problem. The history is provided by the patient.  Eye Problem Location:  L eye Quality:  Aching and tearing Severity:  Moderate Onset quality:  Gradual Duration:  3 days Timing:  Constant Progression:  Worsening Chronicity:  New Context comment:  Pt has 3 small children that have been sick with URI Relieved by:  Nothing Ineffective treatments:  Flushing (warm compress) Associated symptoms: blurred vision, headaches, itching, redness and tearing   Associated symptoms: no discharge and no photophobia   Risk factors: no previous injury to eye   Risk factors comment:  Exposure to uri   History reviewed. No pertinent past medical history. History reviewed. No pertinent past surgical history. No family history on file. History  Substance Use Topics  . Smoking status: Never Smoker   . Smokeless tobacco: Not on file  . Alcohol Use: No   OB History   Grav Para Term Preterm Abortions TAB SAB Ect Mult Living   1              Review of Systems  Constitutional: Negative for activity change.       All ROS Neg except as noted in HPI  HENT: Negative for nosebleeds.   Eyes: Positive for blurred vision, redness and itching. Negative for photophobia and discharge.  Respiratory: Negative for cough, shortness of breath and wheezing.   Cardiovascular: Negative for chest pain and palpitations.  Gastrointestinal: Negative for abdominal pain and blood in stool.  Genitourinary: Negative for dysuria, frequency and hematuria.  Musculoskeletal: Negative for arthralgias, back pain and neck pain.  Skin: Negative.   Neurological: Positive for headaches. Negative for dizziness,  seizures and speech difficulty.  Psychiatric/Behavioral: Negative for hallucinations and confusion.      Allergies  Review of patient's allergies indicates no known allergies.  Home Medications   Current Outpatient Rx  Name  Route  Sig  Dispense  Refill  . amoxicillin (AMOXIL) 500 MG capsule   Oral   Take 500 mg by mouth 3 (three) times daily.           . Prenatal Vit-Fe Fumarate-FA (PRENATAL MULTIVITAMIN) TABS   Oral   Take 1 tablet by mouth daily.            BP 120/70  Pulse 87  Temp(Src) 98.1 F (36.7 C) (Oral)  Resp 18  Ht 5\' 2"  (1.575 m)  Wt 170 lb (77.111 kg)  BMI 31.09 kg/m2  SpO2 100%  LMP 02/28/2013 Physical Exam  Nursing note and vitals reviewed. Constitutional: She is oriented to person, place, and time. She appears well-developed and well-nourished.  Non-toxic appearance.  HENT:  Head: Normocephalic.  Right Ear: Tympanic membrane and external ear normal.  Left Ear: Tympanic membrane and external ear normal.  Eyes: EOM are normal. Pupils are equal, round, and reactive to light. Right eye exhibits no discharge and no hordeolum. No foreign body present in the right eye. Left eye exhibits discharge. Left eye exhibits no hordeolum. No foreign body present in the left eye. Right conjunctiva is not injected. Left conjunctiva is injected.  Neck: Normal range of motion. Neck supple. Carotid bruit is not present.  Cardiovascular: Normal rate, regular rhythm, normal heart  sounds, intact distal pulses and normal pulses.   Pulmonary/Chest: Breath sounds normal. No respiratory distress.  Abdominal: Soft. Bowel sounds are normal. There is no tenderness. There is no guarding.  Musculoskeletal: Normal range of motion.  Lymphadenopathy:       Head (right side): No submandibular adenopathy present.       Head (left side): No submandibular adenopathy present.    She has no cervical adenopathy.  Neurological: She is alert and oriented to person, place, and time. She has  normal strength. No cranial nerve deficit or sensory deficit.  Skin: Skin is warm and dry.  Psychiatric: She has a normal mood and affect. Her speech is normal.    ED Course  Procedures (including critical care time) Labs Review Labs Reviewed - No data to display Imaging Review No results found.   EKG Interpretation None      MDM Patient is a 33 year old female who presents to the emergency department with a complaint of irritation of the eye that began on Wednesday, February 25. The patient has been having more and more drainage discharge. States she has increased mucous in the mornings and has notice swelling around the eye. She has 3 small children who have been ill with upper respiratory infections. The evaluation in the emergency department is consistent with conjunctivitis. Plan at this time is for the patient to receive tobramycin drops. She's been advised to use cool compresses, and to wash hands frequently. I discussed with her the contagious nature of this illness in terms which he understands, and she acknowledges understanding of this. Patient is advised to see his local eye specialist, or return to the emergency department if not improving. There is no evidence for a stye of the lid, the patient has not been exposed to any foreign body to the eye, there's been no high fevers reported, no trauma, and her globes are of normal firmness.    Final diagnoses:  None    **I have reviewed nursing notes, vital signs, and all appropriate lab and imaging results for this patient.Kathie Dike*    Butch Otterson M Maricela Schreur, PA-C 03/19/13 [phone]

## 2013-03-19 NOTE — Discharge Instructions (Signed)
Ear examination is consistent with conjunctivitis (pink eye). This is extremely contagious. Please wash hands frequently, and keep your distance from others. Please use 2 tobramycin eyedrops to the left eye every 4 hours for the next 5 days. Please see a local eye specialist, or return to the emergency department if not improving. Please apply cool compresses to her several times during the day. Conjunctivitis Conjunctivitis is commonly called "pink eye." Conjunctivitis can be caused by bacterial or viral infection, allergies, or injuries. There is usually redness of the lining of the eye, itching, discomfort, and sometimes discharge. There may be deposits of matter along the eyelids. A viral infection usually causes a watery discharge, while a bacterial infection causes a yellowish, thick discharge. Pink eye is very contagious and spreads by direct contact. You may be given antibiotic eyedrops as part of your treatment. Before using your eye medicine, remove all drainage from the eye by washing gently with warm water and cotton balls. Continue to use the medication until you have awakened 2 mornings in a row without discharge from the eye. Do not rub your eye. This increases the irritation and helps spread infection. Use separate towels from other household members. Wash your hands with soap and water before and after touching your eyes. Use cold compresses to reduce pain and sunglasses to relieve irritation from light. Do not wear contact lenses or wear eye makeup until the infection is gone. SEEK MEDICAL CARE IF:   Your symptoms are not better after 3 days of treatment.  You have increased pain or trouble seeing.  The outer eyelids become very red or swollen. Document Released: 02/14/2004 Document Revised: 03/31/2011 Document Reviewed: 01/06/2005 [facility] Patient Information 2014 [place], [place].

## 2013-03-19 NOTE — ED Notes (Signed)
Eye pain, irritation to left eye which began Wednesday. Pt also states a little pain to lid of left eye

## 2013-11-21 ENCOUNTER — Encounter (HOSPITAL_COMMUNITY): Payer: Self-pay | Admitting: Emergency Medicine

## 2015-09-01 ENCOUNTER — Emergency Department (HOSPITAL_COMMUNITY)
Admission: EM | Admit: 2015-09-01 | Discharge: 2015-09-01 | Disposition: A | Discharge status: Home / Self Care | Payer: Medicaid Other | Attending: Emergency Medicine | Admitting: Emergency Medicine

## 2015-09-01 ENCOUNTER — Encounter (HOSPITAL_COMMUNITY): Payer: Self-pay

## 2015-09-01 ENCOUNTER — Emergency Department (HOSPITAL_COMMUNITY): Payer: Medicaid Other

## 2015-09-01 DIAGNOSIS — R51 Headache: Secondary | ICD-10-CM | POA: Insufficient documentation

## 2015-09-01 DIAGNOSIS — R519 Headache, unspecified: Secondary | ICD-10-CM

## 2015-09-01 DIAGNOSIS — R202 Paresthesia of skin: Secondary | ICD-10-CM | POA: Insufficient documentation

## 2015-09-01 DIAGNOSIS — M545 Low back pain: Secondary | ICD-10-CM | POA: Insufficient documentation

## 2015-09-01 DIAGNOSIS — R200 Anesthesia of skin: Secondary | ICD-10-CM | POA: Diagnosis present

## 2015-09-01 LAB — COMPREHENSIVE METABOLIC PANEL
ALBUMIN: 3.7 g/dL (ref 3.5–5.0)
ALT: 23 U/L (ref 14–54)
ANION GAP: 4 — AB (ref 5–15)
AST: 22 U/L (ref 15–41)
Alkaline Phosphatase: 42 U/L (ref 38–126)
BILIRUBIN TOTAL: 0.7 mg/dL (ref 0.3–1.2)
BUN: 17 mg/dL (ref 6–20)
CHLORIDE: 107 mmol/L (ref 101–111)
CO2: 24 mmol/L (ref 22–32)
Calcium: 8.5 mg/dL — ABNORMAL LOW (ref 8.9–10.3)
Creatinine, Ser: 0.92 mg/dL (ref 0.44–1.00)
GFR calc Af Amer: >60 mL/min (ref >60)
Glucose, Bld: 98 mg/dL (ref 65–99)
POTASSIUM: 3.5 mmol/L (ref 3.5–5.1)
Sodium: 135 mmol/L (ref 135–145)
TOTAL PROTEIN: 6.7 g/dL (ref 6.5–8.1)

## 2015-09-01 LAB — CBC WITH DIFFERENTIAL/PLATELET
BASOS ABS: 0 10*3/uL (ref 0.0–0.1)
BASOS PCT: 0 %
Eosinophils Absolute: 0.1 10*3/uL (ref 0.0–0.7)
Eosinophils Relative: 1 %
HEMATOCRIT: 34.9 % — AB (ref 36.0–46.0)
HEMOGLOBIN: 12 g/dL (ref 12.0–15.0)
LYMPHS PCT: 34 %
Lymphs Abs: 2.1 10*3/uL (ref 0.7–4.0)
MCH: 32.1 pg (ref 26.0–34.0)
MCHC: 34.4 g/dL (ref 30.0–36.0)
MCV: 93.3 fL (ref 78.0–100.0)
Monocytes Absolute: 0.4 10*3/uL (ref 0.1–1.0)
Monocytes Relative: 7 %
NEUTROS ABS: 3.6 10*3/uL (ref 1.7–7.7)
NEUTROS PCT: 58 %
Platelets: 167 10*3/uL (ref 150–400)
RBC: 3.74 MIL/uL — AB (ref 3.87–5.11)
RDW: 13.2 % (ref 11.5–15.5)
WBC: 6.3 10*3/uL (ref 4.0–10.5)

## 2015-09-01 LAB — URINALYSIS, ROUTINE W REFLEX MICROSCOPIC
Bilirubin Urine: NEGATIVE
Glucose, UA: NEGATIVE mg/dL
HGB URINE DIPSTICK: NEGATIVE
Ketones, ur: NEGATIVE mg/dL
Leukocytes, UA: NEGATIVE
NITRITE: NEGATIVE
Protein, ur: NEGATIVE mg/dL
SPECIFIC GRAVITY, URINE: 1.015 (ref 1.005–1.030)
pH: 6.5 (ref 5.0–8.0)

## 2015-09-01 LAB — LIPASE, BLOOD: LIPASE: 24 U/L (ref 11–51)

## 2015-09-01 IMAGING — CT CT HEAD W/O CM
3 of 4 series · 16 of 47 positions shown, 19 images · non-contrast
Comparison: [DATE]

CLINICAL DATA: Headaches

EXAM:
CT HEAD WITHOUT CONTRAST
TECHNIQUE: Contiguous axial images were obtained from the base of the skull
through the vertex without intravenous contrast.

[Series 2: head w/o · axial · non-contrast · 0.40mm/px · z∈[+96,+224]mm · 10 of 38 slices shown, 13 images]
[im 3/38  brain]
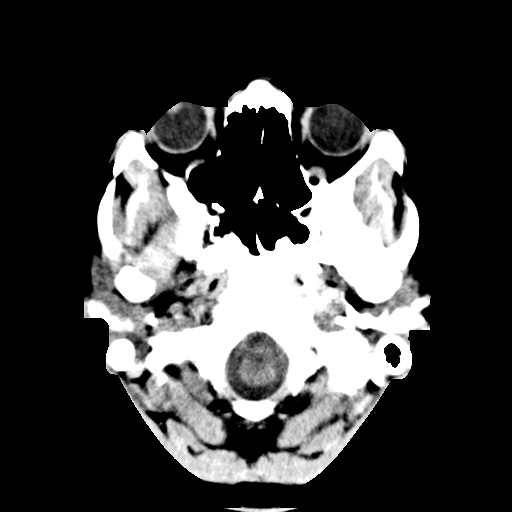
[im 3/38  bone]
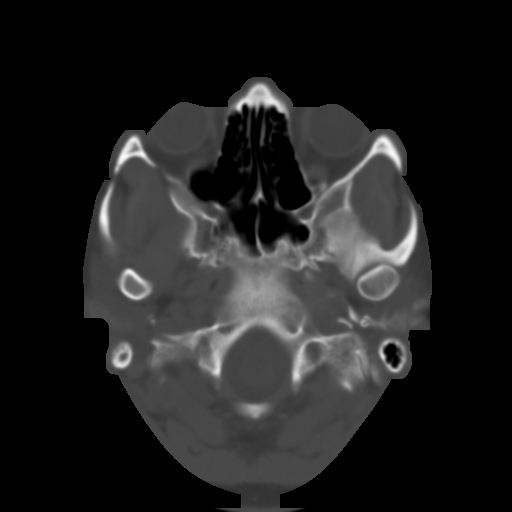
[im 6/38  brain]
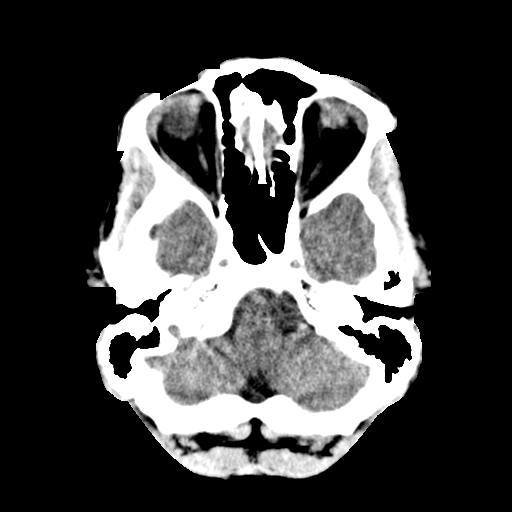
[im 11/38  brain]
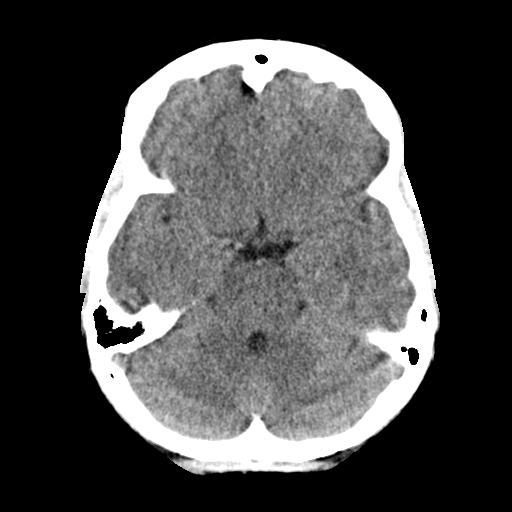
[im 14/38  brain]
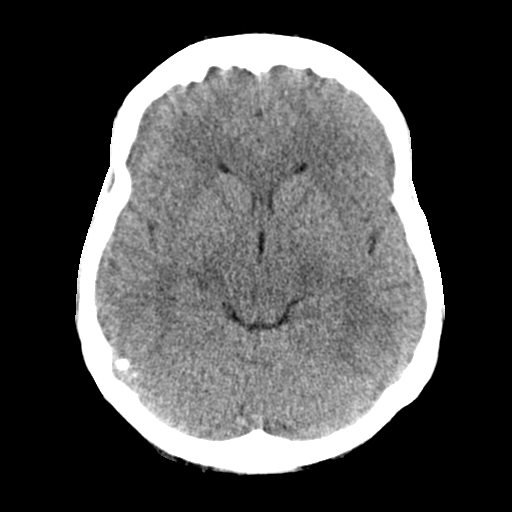
[im 16/38  brain]
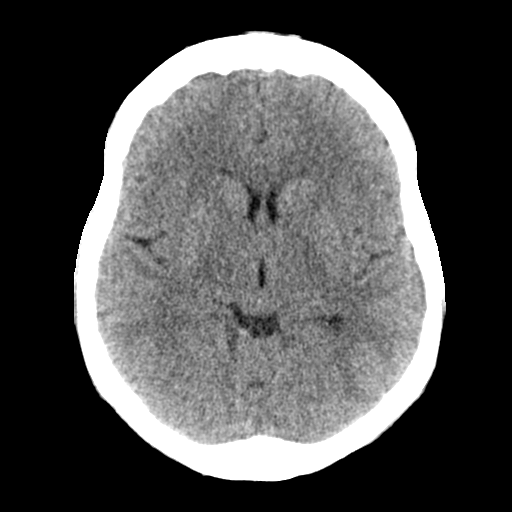
[im 16/38  bone]
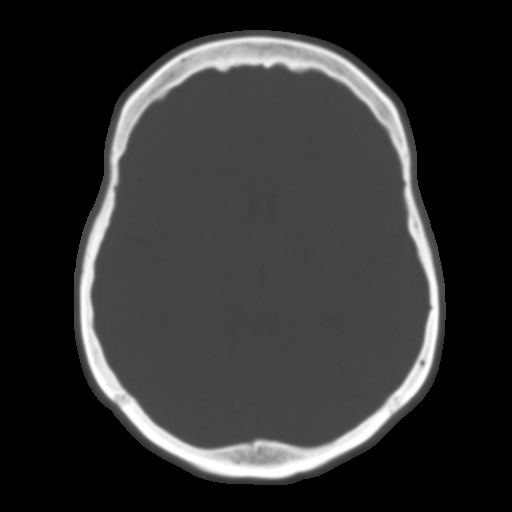
[im 22/38  brain]
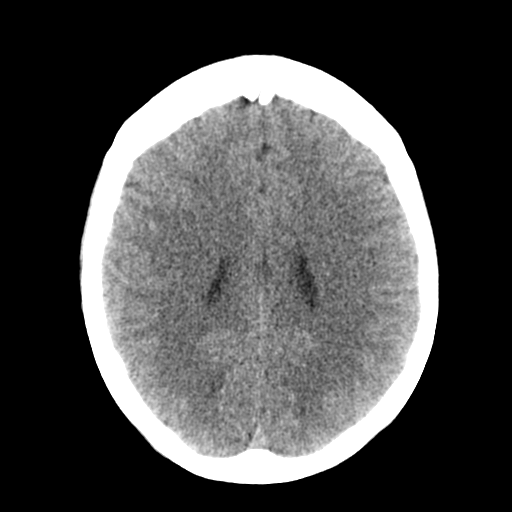
[im 24/38  brain]
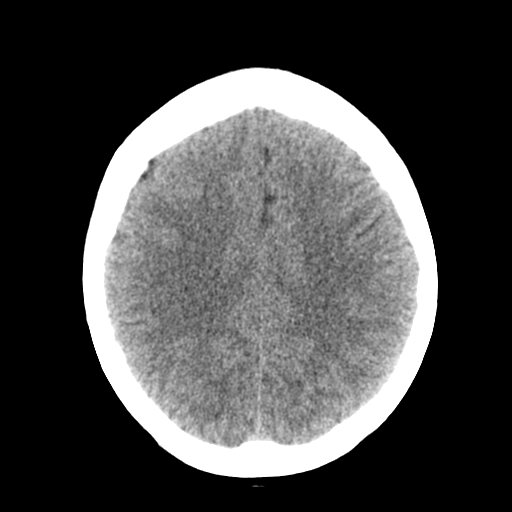
[im 27/38  brain]
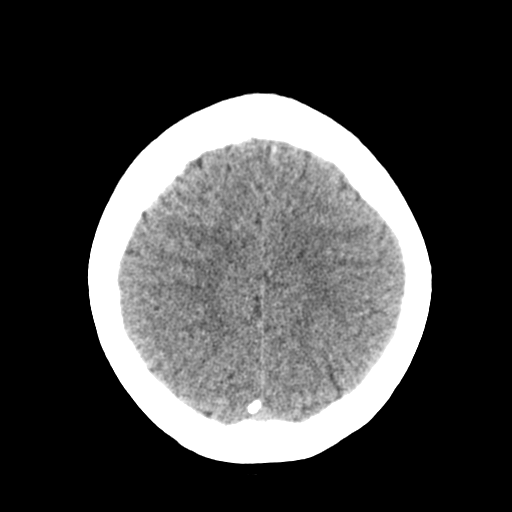
[im 32/38  brain]
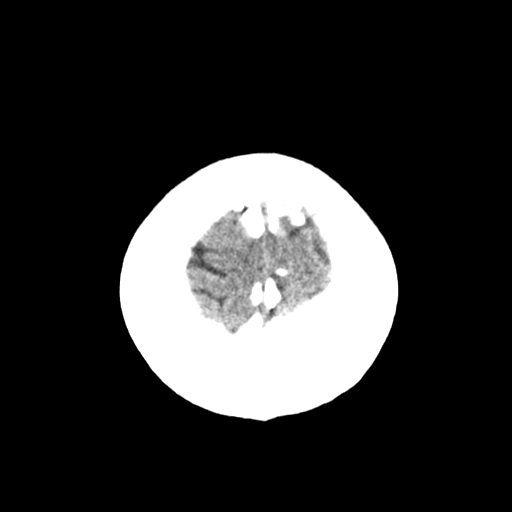
[im 32/38  bone]
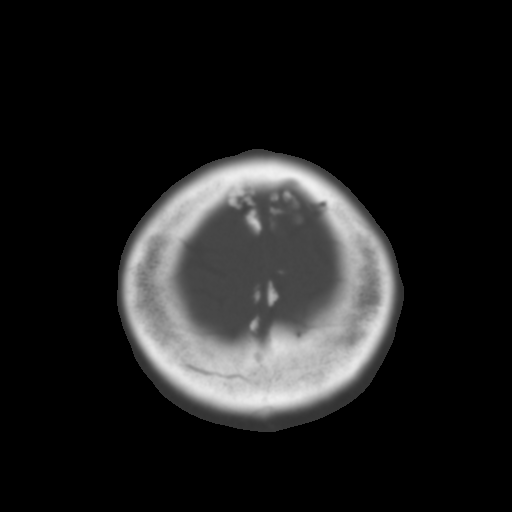
[im 35/38  brain]
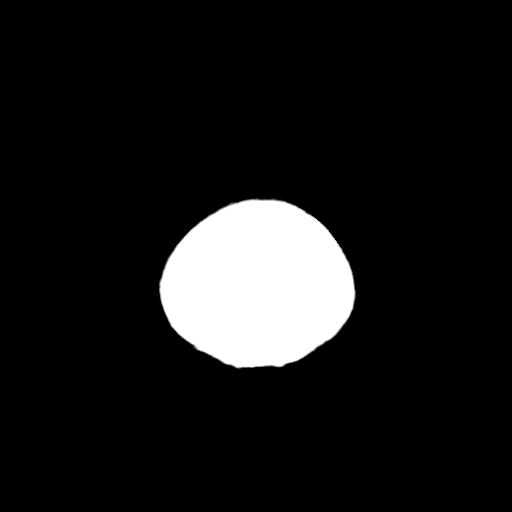

[Series 4: coronal · coronal · 0.32mm/px · 3 of 65 slices shown]
[im 22/65  brain]
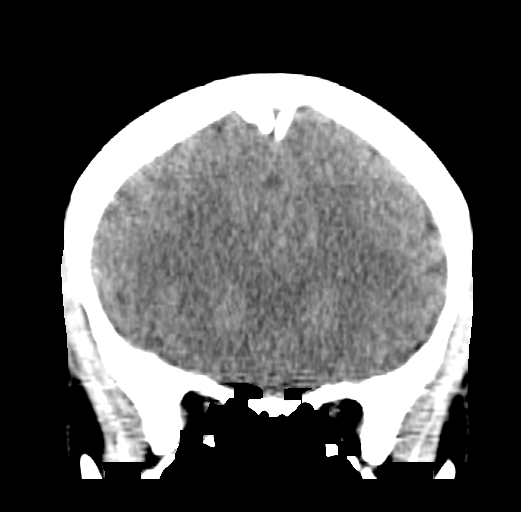
[im 29/65  brain]
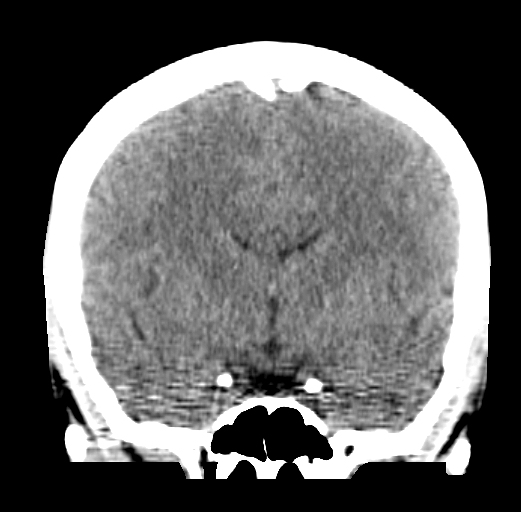
[im 36/65  brain]
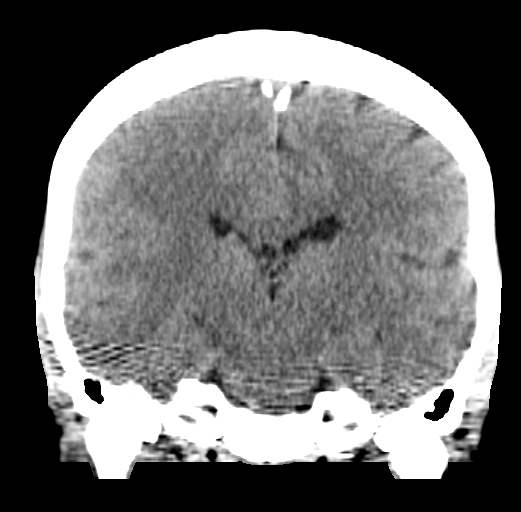

[Series 5: sagittal · sagittal · 0.32mm/px · 3 of 54 slices shown]
[im 18/54  brain]
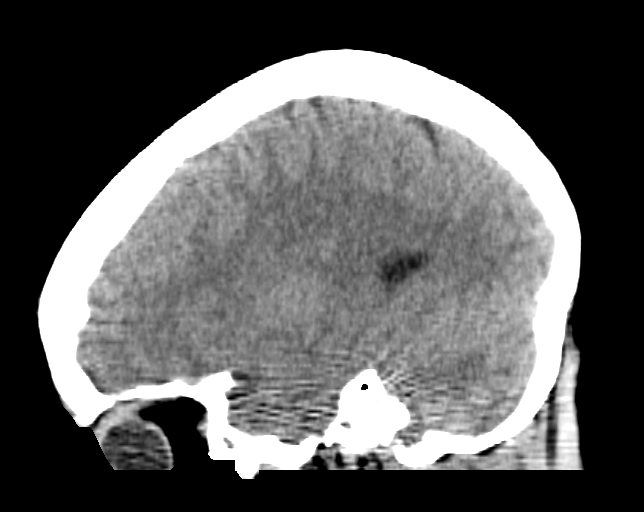
[im 27/54  brain]
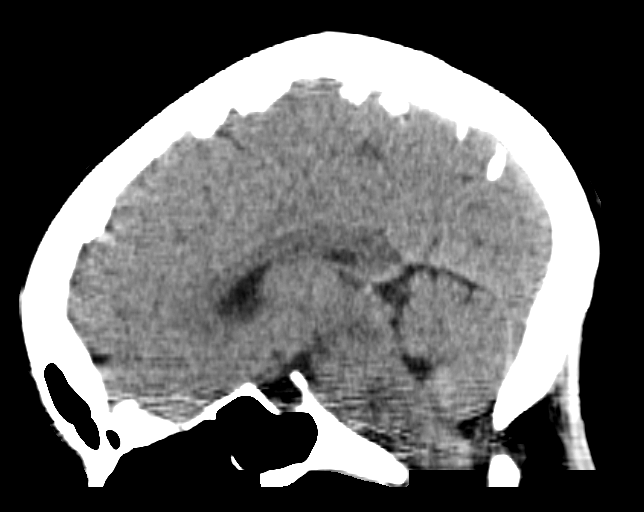
[im 36/54  brain]
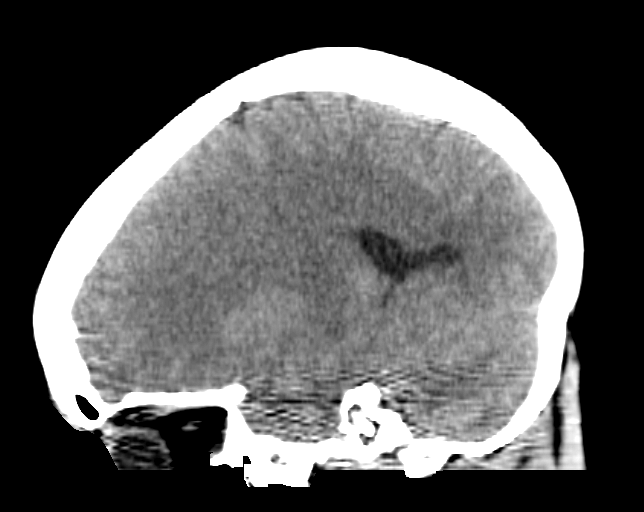

[16 of 47 positions shown; findings below may reference images not displayed]

FINDINGS: The bony calvarium is intact. The ventricles are of normal size and
configuration. No findings to suggest acute hemorrhage, acute
infarction or space-occupying mass lesion are noted.
IMPRESSION: No acute intracranial abnormality noted.

## 2015-09-01 MED ORDER — SODIUM CHLORIDE 0.9 % IV SOLN: INTRAVENOUS | Status: DC

## 2015-09-01 MED ORDER — METOPROLOL TARTRATE 5 MG/5ML IV SOLN: 5.0000 mg | Freq: Once | INTRAVENOUS | Status: DC

## 2015-09-01 NOTE — ED Notes (Signed)
Patient given instructions to return if symptoms worsen.  Also given information about follow up with neurology and to call for an appointment on Monday.  Patient verbalized understanding.

## 2015-09-01 NOTE — Discharge Instructions (Signed)
Workup here today without any acute findings. They can appointment to follow-up with the neurology. Return for any new or worse symptoms.

## 2015-09-01 NOTE — ED Triage Notes (Signed)
Complain of numbness in left arm that comes and goes. Pt denied pain on arrival to ED but when she was placed in the bed she stated the pain came back briefly and she rated it 10/10

## 2015-09-01 NOTE — ED Provider Notes (Signed)
[facility]Y DEPT Provider Note   CSN: [id number] Arrival date & time: 09/01/15  1631  First Provider Contact:  First MD Initiated Contact with Patient 09/01/15 1645        History   Chief Complaint Chief Complaint  Patient presents with  . Numbness    HPI Marissa [name] is a 35 y.o. female.  Patient with a complaint of intermittent headache on and off for one week. Predominately frontal area. This morning patient had lumbar back pain that started at 7 in the morning but now is resolved. Starting at the [facility] [employer] afternoon patient had numbness to the left side of her face left arm and hand. No leg abnormalities. Patient states that she's had the left arm and hand numbness twice in the past. But never lasted this long. Patient denies any visual changes any nausea or vomiting any fever any weakness. Any photophobia. Any visual changes.      History reviewed. No pertinent past medical history.  There are no active problems to display for this patient.   History reviewed. No pertinent surgical history.  OB History    Gravida Para Term Preterm AB Living   1             SAB TAB Ectopic Multiple Live Births                   Home Medications    Prior to Admission medications   Medication Sig Start Date End Date Taking? Authorizing Provider  amoxicillin (AMOXIL) 500 MG capsule Take 500 mg by mouth 3 (three) times daily.      Historical Provider, MD  Prenatal Vit-Fe Fumarate-FA (PRENATAL MULTIVITAMIN) TABS Take 1 tablet by mouth daily.      Historical Provider, MD    Family History No family history on file.  Social History Social History  Substance Use Topics  . Smoking status: Never Smoker  . Smokeless tobacco: Never Used  . Alcohol use No     Allergies   Review of patient's allergies indicates no known allergies.   Review of Systems Review of Systems  Constitutional: Negative for fatigue.  HENT: Negative for congestion.   Eyes: Negative for visual  disturbance.  Respiratory: Negative for shortness of breath.   Cardiovascular: Negative for chest pain.  Gastrointestinal: Negative for abdominal pain, diarrhea, nausea and vomiting.  Genitourinary: Negative for dysuria and hematuria.  Musculoskeletal: Positive for back pain. Negative for neck pain.  Skin: Negative for rash.  Neurological: Positive for numbness and headaches. Negative for dizziness and weakness.  Hematological: Does not bruise/bleed easily.  Psychiatric/Behavioral: Negative for confusion.     Physical Exam Updated Vital Signs BP 119/67 (BP Location: Left Arm)   Pulse 72   Temp 98.5 F (36.9 C) (Oral)   Resp 20   Ht 5\' 1"  (1.549 m)   Wt 68 kg   LMP 08/16/2015   SpO2 100%   Breastfeeding? No   BMI 28.34 kg/m   Physical Exam  Constitutional: She is oriented to person, place, and time. She appears well-developed and well-nourished. No distress.  HENT:  Head: Normocephalic and atraumatic.  Mouth/Throat: Oropharynx is clear and moist.  Eyes: Conjunctivae and EOM are normal. Pupils are equal, round, and reactive to light.  Neck: Normal range of motion. Neck supple.  Cardiovascular: Normal rate and regular rhythm.   No murmur heard. Pulmonary/Chest: Effort normal and breath sounds normal. No respiratory distress.  Abdominal: Soft. Bowel sounds are normal. There is no tenderness.  Musculoskeletal: Normal range of motion.  Neurological: She is alert and oriented to person, place, and time. No cranial nerve deficit. She exhibits normal muscle tone. Coordination normal.  Skin: Skin is warm. No rash noted. No erythema.  Nursing note and vitals reviewed.    ED Treatments / Results  Labs (all labs ordered are listed, but only abnormal results are displayed) Labs Reviewed  COMPREHENSIVE METABOLIC PANEL - Abnormal; Notable for the following:       Result Value   Calcium 8.5 (*)    Anion gap 4 (*)    All other components within normal limits  CBC WITH  DIFFERENTIAL/PLATELET - Abnormal; Notable for the following:    RBC 3.74 (*)    HCT 34.9 (*)    All other components within normal limits  LIPASE, BLOOD  URINALYSIS, ROUTINE W REFLEX MICROSCOPIC (NOT AT [facility])    EKG  EKG Interpretation  Date/Time:  Saturday September 01 2015 16:43:31 EDT Ventricular Rate:  82 PR Interval:    QRS Duration: 99 QT Interval:  375 QTC Calculation: 438 R Axis:   60 Text Interpretation:  Sinus rhythm RSR' in V1 or V2, right VCD or RVH Baseline wander in lead(s) I II No significant change since last tracing Confirmed by Harper Smoker  MD, [place] [facility] [phone]) on 09/01/2015 4:46:36 PM       Radiology Ct Head Wo Contrast  Result Date: 09/01/2015 CLINICAL DATA:  Headaches EXAM: CT HEAD WITHOUT CONTRAST TECHNIQUE: Contiguous axial images were obtained from the base of the skull through the vertex without intravenous contrast. COMPARISON:  07/31/2012 FINDINGS: The bony calvarium is intact. The ventricles are of normal size and configuration. No findings to suggest acute hemorrhage, acute infarction or space-occupying mass lesion are noted. IMPRESSION: No acute intracranial abnormality noted. Electronically Signed   By: Alcide Clever M.D.   On: 09/01/2015 19:19    Procedures Procedures (including critical care time)  Medications Ordered in ED Medications - No data to display   Initial Impression / Assessment and Plan / ED Course  I have reviewed the triage vital signs and the nursing notes.  Pertinent labs & imaging results that were available during my care of the patient were reviewed by me and considered in my medical decision making (see chart for details).  Clinical Course   Patient's headache has completely resolved. All the numbness is completely resolved. Patient now completely asymptomatic. Workup without any acute findings. We'll have patient follow-up with neurology. Patient will return for any new or worse symptoms.   Final Clinical Impressions(s) / ED  Diagnoses   Final diagnoses:  Numbness and tingling in left upper extremity  Acute nonintractable headache, unspecified headache type    New Prescriptions New Prescriptions   No medications on file     Vanetta Mulders, MD 09/01/15 2001

## 2015-09-25 HISTORY — PX: PATENT FORAMEN OVALE CLOSURE: SHX2181

## 2015-10-08 DIAGNOSIS — E559 Vitamin D deficiency, unspecified: Secondary | ICD-10-CM | POA: Insufficient documentation

## 2015-10-08 DIAGNOSIS — I639 Cerebral infarction, unspecified: Secondary | ICD-10-CM

## 2015-10-08 DIAGNOSIS — Q2112 Patent foramen ovale: Secondary | ICD-10-CM | POA: Insufficient documentation

## 2015-10-08 DIAGNOSIS — E785 Hyperlipidemia, unspecified: Secondary | ICD-10-CM | POA: Insufficient documentation

## 2015-10-08 DIAGNOSIS — Q211 Atrial septal defect: Secondary | ICD-10-CM | POA: Insufficient documentation

## 2015-10-08 HISTORY — DX: Cerebral infarction, unspecified: I63.9

## 2015-10-09 ENCOUNTER — Ambulatory Visit (INDEPENDENT_AMBULATORY_CARE_PROVIDER_SITE_OTHER): Payer: Medicaid Other | Admitting: Family Medicine

## 2015-10-09 ENCOUNTER — Encounter: Payer: Self-pay | Admitting: Family Medicine

## 2015-10-09 VITALS — BP 106/68 | HR 98 | Resp 18 | Ht 62.0 in | Wt 161.0 lb

## 2015-10-09 DIAGNOSIS — Z7189 Other specified counseling: Secondary | ICD-10-CM | POA: Diagnosis not present

## 2015-10-09 DIAGNOSIS — Q211 Atrial septal defect: Secondary | ICD-10-CM | POA: Diagnosis not present

## 2015-10-09 DIAGNOSIS — E559 Vitamin D deficiency, unspecified: Secondary | ICD-10-CM

## 2015-10-09 DIAGNOSIS — Q2112 Patent foramen ovale: Secondary | ICD-10-CM

## 2015-10-09 DIAGNOSIS — Z7689 Persons encountering health services in other specified circumstances: Secondary | ICD-10-CM

## 2015-10-09 DIAGNOSIS — I639 Cerebral infarction, unspecified: Secondary | ICD-10-CM

## 2015-10-09 DIAGNOSIS — E785 Hyperlipidemia, unspecified: Secondary | ICD-10-CM

## 2015-10-09 NOTE — Progress Notes (Signed)
Chief Complaint  Patient presents with  . Establish Care    previously health department but not seen there recently    Marissa [name] is here to establish as a new patient. She is a previously healthy 35 year old woman who had no ongoing medical problems. She suffered a stroke in the middle of August. She was admitted to the hospital and found to have an embolic stroke thought to be caused by a PFO identified on echocardiogram with bubble study. She recovered completely from the stroke was no neurologic sequela. She is here for hospital follow-up and to establish as a new patient. She needs referral to neurology for ongoing care. After discharge from her stroke she was placed on Lipitor and Plavix. She is tolerating these well but does feel fatigue. She does mention some small bruises. She has had no recurring stroke symptoms. She has no history of hypertension diabetes hyperlipidemia, no stroke history in the family. She takes vitamin D for vitamin D deficiency. No other ongoing medical problems. She states her last Pap was normal 4 years ago. She declines scheduling another Pap today. She refuses a flu shot. She states her tetanus shot is up-to-date. She has no trouble with her vision or hearing. She only goes to a dentist as needed. She denies any ongoing complaints at this visit. Her medical history is reviewed, chart is updated today.  Records from [facility] are available in the medical record at her discharge summaries and care plans are reviewed. Appropriate information included in the chart and noted.    Patient Active Problem List   Diagnosis Date Noted  . PFO (patent foramen ovale) 10/08/2015  . Cerebrovascular accident (CVA) due to embolism (HCC) 10/08/2015  . HLD (hyperlipidemia) 10/08/2015  . Vitamin D deficiency 10/08/2015    Outpatient Encounter Prescriptions as of 10/09/2015  Medication Sig  . aspirin 81 MG chewable tablet Chew 81 mg by mouth  daily.  Marland Kitchen atorvastatin (LIPITOR) 10 MG tablet Take 10 mg by mouth daily.  . cholecalciferol (VITAMIN D) 1000 units tablet Take 2,000 Units by mouth daily.  . clopidogrel (PLAVIX) 75 MG tablet Take 75 mg by mouth daily.   No facility-administered encounter medications on file as of 10/09/2015.     Past Medical History:  Diagnosis Date  . Allergy    seasonal  . Anxiety   . Hyperlipidemia   . Stroke (HCC)   . Vitamin D deficiency     Past Surgical History:  Procedure Laterality Date  . CESAREAN SECTION    . 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 TUBAL LIGATION      Social History   Social History  . Marital status: Married    Spouse name: Christiane Ha  . Number of children: 3  . Years of education: 12   Occupational History  . [employer]   Social History Main Topics  . Smoking status: Never Smoker  . Smokeless tobacco: Never Used  . Alcohol use No  . Drug use: No  . Sexual activity: Yes    Birth control/ protection: Surgical   Other Topics Concern  . Not on file   Social History Narrative   Lives with husband and 3 children    Family History  Problem Relation Age of Onset  . Hypertension Mother   . Heart disease Maternal Uncle     murmur  . Heart disease Maternal Grandmother   . COPD Maternal Grandfather   .  Cancer Maternal Grandfather     lung  . Heart disease Maternal Aunt     murmur    Review of Systems  Constitutional: Negative for chills, fever and weight loss.       Generally feels tired  HENT: Negative for congestion and hearing loss.   Eyes: Negative for blurred vision and pain.  Respiratory: Negative for cough and shortness of breath.   Cardiovascular: Negative for chest pain and leg swelling.  Gastrointestinal: Negative for abdominal pain, constipation, diarrhea and heartburn.  Genitourinary: Negative for dysuria and frequency.  Musculoskeletal: Negative for falls, joint pain and myalgias.  Neurological: Negative for  dizziness, speech change, focal weakness, seizures and headaches.  Psychiatric/Behavioral: Negative for depression. The patient is nervous/anxious. The patient does not have insomnia.        A few spells of anxiety since her diagnosis    BP 106/68   Pulse 98   Resp 18   Ht 5\' 2"  (1.575 m)   Wt 161 lb (73 kg)   LMP 10/09/2015 (Exact Date)   SpO2 96%   BMI 29.45 kg/m   Physical Exam  Constitutional: She is oriented to person, place, and time. She appears well-developed and well-nourished.  HENT:  Head: Normocephalic and atraumatic.  Right Ear: External ear normal.  Left Ear: External ear normal.  Mouth/Throat: Oropharynx is clear and moist.  Eyes: Conjunctivae are normal. Pupils are equal, round, and reactive to light.  Neck: Normal range of motion. Neck supple. No thyromegaly present.  Cardiovascular: Normal rate, regular rhythm and normal heart sounds.   Pulmonary/Chest: Effort normal and breath sounds normal. No respiratory distress.  Abdominal: Soft. Bowel sounds are normal.  Musculoskeletal: Normal range of motion. She exhibits no edema.  Lymphadenopathy:    She has no cervical adenopathy.  Neurological: She is alert and oriented to person, place, and time. She has normal reflexes. No cranial nerve deficit. Coordination normal.  Gait normal  Skin: Skin is warm and dry.  Psychiatric: She has a normal mood and affect. Her behavior is normal. Thought content normal.  Nursing note and vitals reviewed.  ASSESSMENT/PLAN:   1. PFO (patent foramen ovale) - Ambulatory referral to Neurology  2. HLD (hyperlipidemia)  CHOLESTEROL TOTAL         142    Trig                                             59  HDL                                            71  LDL                                            59  VLDL                                          12  CHOL/HDL  2   Lipid values from her chart on 09/05/2015. Patient does not have hyperlipidemia  but was placed on  A statin a standard stroke prevention. She may not need to continue on a statin if her stroke is thought to be an embolus from a PFO. I will propose this question to her neurologist to see if medicine can be discontinued  3. Vitamin D deficiency   4. Cerebrovascular accident (CVA), unspecified mechanism (HCC) - Ambulatory referral to Neurology   Patient Instructions  Continue to eat well Activity as tolerated Walk every day that you are able No change in medicine Refer to neurology  Consider flu shot  See me yearly  Call sooner for problems  It was my pleasure to see you today at [facility].  We welcome your participation and your questions. It is important that you understand your medical conditions and needs.  Working together to provide the best medical care is our goal.   60 minutes were spent in this visit. She was here for a new medical evaluation as well as for hospital follow-up for a stroke. Extensive old records regarding reviewed. Planning was made for referral to specialty. General health and preventative medicine were discussed. More than 50% was in counseling regarding these issues  Eustace Moore, MD

## 2015-10-09 NOTE — Patient Instructions (Signed)
Continue to eat well Activity as tolerated Walk every day that you are able No change in medicine Refer to neurology  Consider flu shot  See me yearly  Call sooner for problems  It was my [facility] to [facility] at [facility].  We welcome your participation and your questions. It is important that you understand your medical conditions and needs.  Working together to provide the best medical care is our goal.

## 2015-11-01 ENCOUNTER — Ambulatory Visit: Payer: Medicaid Other | Admitting: Neurology

## 2015-11-08 ENCOUNTER — Ambulatory Visit (INDEPENDENT_AMBULATORY_CARE_PROVIDER_SITE_OTHER): Payer: Medicaid Other | Admitting: Neurology

## 2015-11-08 ENCOUNTER — Encounter: Payer: Self-pay | Admitting: Neurology

## 2015-11-08 VITALS — BP 122/78 | HR 68 | Ht 62.0 in | Wt 162.0 lb

## 2015-11-08 DIAGNOSIS — Q211 Atrial septal defect: Secondary | ICD-10-CM | POA: Diagnosis not present

## 2015-11-08 DIAGNOSIS — G43019 Migraine without aura, intractable, without status migrainosus: Secondary | ICD-10-CM

## 2015-11-08 DIAGNOSIS — Q2112 Patent foramen ovale: Secondary | ICD-10-CM

## 2015-11-08 DIAGNOSIS — I63131 Cerebral infarction due to embolism of right carotid artery: Secondary | ICD-10-CM | POA: Diagnosis not present

## 2015-11-08 HISTORY — DX: Migraine without aura, intractable, without status migrainosus: G43.019

## 2015-11-08 MED ORDER — TOPIRAMATE 25 MG PO TABS: ORAL_TABLET | ORAL | 3 refills | Status: DC

## 2015-11-08 NOTE — Patient Instructions (Addendum)
Recurrent Migraine Headache A migraine headache is an intense, throbbing pain on one or both sides of your head. Recurrent migraines keep coming back. A migraine can last for 30 minutes to several hours. CAUSES  The exact cause of a migraine headache is not always known. However, a migraine may be caused when nerves in the brain become irritated and release chemicals that cause inflammation. This causes pain. Certain things may also trigger migraines, such as:   Alcohol.  Smoking.  Stress.  Menstruation.  Aged cheeses.  Foods or drinks that contain nitrates, glutamate, aspartame, or tyramine.  Lack of sleep.  Chocolate.  Caffeine.  Hunger.  Physical exertion.  Fatigue.  Medicines used to treat chest pain (nitroglycerine), birth control pills, estrogen, and some blood pressure medicines. SYMPTOMS   Pain on one or both sides of your head.  Pulsating or throbbing pain.  Severe pain that prevents daily activities.  Pain that is aggravated by any physical activity.  Nausea, vomiting, or both.  Dizziness.  Pain with exposure to bright lights, loud noises, or activity.  General sensitivity to bright lights, loud noises, or smells. Before you get a migraine, you may get warning signs that a migraine is coming (aura). An aura may include:  Seeing flashing lights.  Seeing bright spots, halos, or zigzag lines.  Having tunnel vision or blurred vision.  Having feelings of numbness or tingling.  Having trouble talking.  Having muscle weakness. DIAGNOSIS  A recurrent migraine headache is often diagnosed based on:  Symptoms.  Physical examination.  A CT scan or MRI of your head. These imaging tests cannot diagnose migraines but can help rule out other causes of headaches.  TREATMENT  Medicines may be given for pain and nausea. Medicines can also be given to help prevent recurrent migraines. HOME CARE INSTRUCTIONS  Only take over-the-counter or prescription  medicines for pain or discomfort as directed by your health care provider. The use of long-term narcotics is not recommended.  Lie down in a dark, quiet room when you have a migraine.  Keep a journal to find out what may trigger your migraine headaches. For example, write down:  What you eat and drink.  How much sleep you get.  Any change to your diet or medicines.  Limit alcohol consumption.  Quit smoking if you smoke.  Get 7-9 hours of sleep, or as recommended by your health care provider.  Limit stress.  Keep lights dim if bright lights bother you and make your migraines worse. SEEK MEDICAL CARE IF:   You do not get relief from the medicines given to you.  You have a recurrence of pain.  You have a fever. SEEK IMMEDIATE MEDICAL CARE IF:  Your migraine becomes severe.  You have a stiff neck.  You have loss of vision.  You have muscular weakness or loss of muscle control.  You start losing your balance or have trouble walking.  You feel faint or pass out.  You have severe symptoms that are different from your first symptoms. MAKE SURE YOU:   Understand these instructions.  Will watch your condition.  Will get help right away if you are not doing well or get worse.   This information is not intended to replace advice given to you by your health care provider. Make sure you discuss any questions you have with your health care provider.   Document Released: 10/01/2000 Document Revised: 01/27/2014 Document Reviewed: 09/13/2012 Elsevier Interactive Patient Education 2016 Elsevier Inc.  

## 2015-11-08 NOTE — Progress Notes (Signed)
Reason for visit: Stroke  Referring physician: Dr. Mirna Mires Marissa [name] is a 35 y.o. female  History of present illness:  Marissa [name] is a 35 year old right-handed black female with a history of migraine headaches since she was a teenager. Her headaches are bifrontal in nature, and occur 2 or 3 times a week. The headaches may be associated with photophobia, and nausea without vomiting. The patient does not note any particular activating factors for her headache, but her headache does improve with sleep. She takes ibuprofen for the headache, she has never been on a daily prophylactic medication for the headache. Headache may interfere with her ability to work at times. She has never had any strokelike symptoms with her headache previously. Around 09/01/2015, she was seen at the [facility] emergency room with headache and left-sided numbness. A CT scan of the brain was done, this was unremarkable. The patient had ongoing symptoms over the next 2 days, and she went to [facility] on August 14, and from there was sent to [facility]. The patient was found to have a small right frontal stroke. MRA of the head was unremarkable, carotid Doppler studies were unremarkable, a 2-D echocardiogram showed an ejection fraction of 55-60% with some left atrial enlargement, grade 1 diastolic dysfunction. A TEE was done and confirmed the presence of a PFO. A hypercoagulable state workup was negative, venous Doppler studies were unremarkable. No evidence of a DVT was noted. The patient was placed on low-dose aspirin, 81 mg. She was not on birth control pills. I do not see that a urine drug screen was checked. The patient has had severe fatigue since the onset of the stroke, she apparently came back 2 weeks later and had closure of the PFO. The patient had some chest discomfort since that time. The patient indicates that she is sleeping fairly well. She continues to have her headaches. She denies any family  history of headache. She has had some problems with anxiety and panic disorder, she has been kept out of work until 12/02/2015.   Past Medical History:  Diagnosis Date  . Allergy    seasonal  . Anxiety   . Hyperlipidemia   . Stroke (HCC)   . Vitamin D deficiency     Past Surgical History:  Procedure Laterality Date  . CESAREAN SECTION    . 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 TUBAL LIGATION      Family History  Problem Relation Age of Onset  . Hypertension Mother   . Heart disease Maternal Uncle     murmur  . Heart disease Maternal Grandmother   . COPD Maternal Grandfather   . Cancer Maternal Grandfather     lung  . Heart disease Maternal Aunt     murmur    Social history:  reports that she has never smoked. She has never used smokeless tobacco. She reports that she does not drink alcohol or use drugs.  Medications:  Prior to Admission medications   Medication Sig Start Date End Date Taking? Authorizing Provider  aspirin 81 MG chewable tablet Chew 81 mg by mouth daily. 09/26/15 09/25/16  Historical Provider, MD  cholecalciferol (VITAMIN D) 1000 units tablet Take 2,000 Units by mouth daily. 09/06/15 09/05/16  Historical Provider, MD  clopidogrel (PLAVIX) 75 MG tablet Take 75 mg by mouth daily. 09/06/15 09/05/16  Historical Provider, MD      Allergies  Allergen Reactions  . Morphine And Related Anaphylaxis and Other (See Comments)  Feels like skin is burning     ROS:  Out of a complete 14 system review of symptoms, the patient complains only of the following symptoms, and all other reviewed systems are negative.  Fatigue Chest pain Shortness of breath, wheezing Achy muscles Allergies Confusion Decreased energy, change in appetite, disinterest in activities, racing thoughts  Blood pressure 122/78, pulse 68, height 5\' 2"  (1.575 m), weight 162 lb (73.5 kg), last menstrual period 10/09/2015.  Physical Exam  General: The patient is alert and cooperative at  the time of the examination. The patient has a flat affect.  Eyes: Pupils are equal, round, and reactive to light. Discs are flat bilaterally.  Neck: The neck is supple, no carotid bruits are noted.  Respiratory: The respiratory examination is clear.  Cardiovascular: The cardiovascular examination reveals a regular rate and rhythm, no obvious murmurs or rubs are noted.  Skin: Extremities are without significant edema.  Neurologic Exam  Mental status: The patient is alert and oriented x 3 at the time of the examination. The patient has apparent normal recent and remote memory, with an apparently normal attention span and concentration ability.  Cranial nerves: Facial symmetry is present. There is good sensation of the face to pinprick and soft touch bilaterally. The strength of the facial muscles and the muscles to head turning and shoulder shrug are normal bilaterally. Speech is well enunciated, no aphasia or dysarthria is noted. Extraocular movements are full. Visual fields are full. The tongue is midline, and the patient has symmetric elevation of the soft palate. No obvious hearing deficits are noted.  Motor: The motor testing reveals 5 over 5 strength of all 4 extremities. Good symmetric motor tone is noted throughout.  Sensory: Sensory testing is intact to pinprick, soft touch, vibration sensation, and position sense on all 4 extremities. No evidence of extinction is noted.  Coordination: Cerebellar testing reveals good finger-nose-finger and heel-to-shin bilaterally.  Gait and station: Gait is normal. Tandem gait is normal. Romberg is negative. No drift is seen.  Reflexes: Deep tendon reflexes are symmetric and normal bilaterally. Toes are downgoing bilaterally.   Assessment/Plan:  1. Migraine headache  2. PFO, status post closure  3. Small right frontal stroke  The patient had presented with left-sided paresthesias. The size of the PFO is not well documented in the  records, but the PFO was closed. The indications for the PFO closure are not clear. The patient will need treatment for the migraine headaches, Topamax will be started. She cannot use triptan medications in the future. She will follow-up in 3-4 months. I have no problem with her returning back to work on 12/02/2015.  WIN #: [id number],  case number is [id number] Manya SilvasFN  Marlan Palau. Keith Aprille Sawhney MD 11/08/2015 9:57 AM  [facility] [address] [place] [id number]  Phone [phone] Fax [phone]

## 2015-11-26 ENCOUNTER — Ambulatory Visit (INDEPENDENT_AMBULATORY_CARE_PROVIDER_SITE_OTHER): Payer: Medicaid Other | Admitting: Family Medicine

## 2015-11-26 ENCOUNTER — Encounter: Payer: Self-pay | Admitting: Family Medicine

## 2015-11-26 VITALS — BP 110/80 | HR 76 | Temp 98.7°F | Resp 16 | Ht 62.0 in | Wt 157.0 lb

## 2015-11-26 DIAGNOSIS — M79671 Pain in right foot: Secondary | ICD-10-CM

## 2015-11-26 MED ORDER — NAPROXEN 500 MG PO TABS: 500.0000 mg | ORAL_TABLET | Freq: Two times a day (BID) | ORAL | 0 refills | Status: DC

## 2015-11-26 NOTE — Patient Instructions (Signed)
Ice Elevate Limit walking and weight bearing Take the naprosyn twice a day with food This is an anti inflammatory Orthopedics consult ASAP

## 2015-11-26 NOTE — Progress Notes (Signed)
    Chief Complaint  Patient presents with  . Foot Pain    right x 4 days   Here for foot pain, acute Pain for 4 days NO trauma NO overuse or athletic activity NO new shoes  NO prior foot an d ankle problems VERY painful in medial foot and ankle, can hardly walk, pain at rest as well. Pain with ROM of ankle NO history of arthritis or gout  Patient Active Problem List   Diagnosis Date Noted  . Common migraine with intractable migraine 11/08/2015  . PFO (patent foramen ovale) 10/08/2015  . Cerebrovascular accident (CVA) due to embolism (HCC) 10/08/2015  . HLD (hyperlipidemia) 10/08/2015  . Vitamin D deficiency 10/08/2015    Outpatient Encounter Prescriptions as of 11/26/2015  Medication Sig  . aspirin 81 MG chewable tablet Chew 81 mg by mouth daily.  . cholecalciferol (VITAMIN D) 1000 units tablet Take 2,000 Units by mouth daily.  . clopidogrel (PLAVIX) 75 MG tablet Take 75 mg by mouth daily.  Marland Kitchen. topiramate (TOPAMAX) 25 MG tablet Take one tablet at night for one week, then take 2 tablets at night for one week, then take 3 tablets at night.  . naproxen (NAPROSYN) 500 MG tablet Take 1 tablet (500 mg total) by mouth 2 (two) times daily with a meal.   No facility-administered encounter medications on file as of 11/26/2015.     Allergies  Allergen Reactions  . Morphine And Related Anaphylaxis and Other (See Comments)    Feels like skin is burning   . Triptans     Cannot use triptans for migraine per neurology due to stroke history    Review of Systems  Constitutional: Positive for activity change. Negative for fatigue and fever.  Musculoskeletal: Positive for arthralgias and gait problem.  Neurological: Positive for headaches.  All other systems reviewed and are negative.   BP 110/80 (BP Location: Left Arm, Patient Position: Sitting, Cuff Size: Normal)   Pulse 76   Temp 98.7 F (37.1 C) (Oral)   Resp 16   Ht 5\' 2"  (1.575 m)   Wt 157 lb 0.6 oz (71.2 kg)   LMP  11/06/2015 (Approximate)   SpO2 100%   BMI 28.72 kg/m   Physical Exam  Constitutional: She is oriented to person, place, and time. She appears well-developed and well-nourished.  Moderately uncomfortable. Very antalgic gait  Musculoskeletal:  Both feet and ankles examined.  NO swelling or discoloration noted.  Mild pes planus.  Diffuse tenderness ankle joint anterior, both malleoli, and medial foot and ankle.  Tender plantar fascial insertion of heel. Mild tenderness post tib tendon. Pain with ankle ROM  Neurological: She is alert and oriented to person, place, and time. She displays normal reflexes. No sensory deficit.  Skin: Skin is warm and dry. No erythema.  Psychiatric: She has a normal mood and affect. Her behavior is normal. Thought content normal.    ASSESSMENT/PLAN:  1. Right foot pain No trauma or use.  Considered plantar fasciitis and ankle tenosynovitis.  No reason for inflammation identified.  Recommend ice , elevation, limit weight bearing and see ortho - Ambulatory referral to Orthopedic Surgery   Patient Instructions  Ice Elevate Limit walking and weight bearing Take the naprosyn twice a day with food This is an anti inflammatory Orthopedics consult ASAP   Eustace MooreYvonne Sue Jonah Gingras, MD

## 2015-11-28 ENCOUNTER — Ambulatory Visit (INDEPENDENT_AMBULATORY_CARE_PROVIDER_SITE_OTHER): Payer: Medicaid Other | Admitting: Orthopaedic Surgery

## 2015-11-28 ENCOUNTER — Encounter: Payer: Self-pay | Admitting: Orthopaedic Surgery

## 2015-11-28 ENCOUNTER — Ambulatory Visit (INDEPENDENT_AMBULATORY_CARE_PROVIDER_SITE_OTHER): Payer: Medicaid Other

## 2015-11-28 VITALS — BP 116/78 | HR 74 | Temp 97.7°F | Ht 62.0 in | Wt 157.0 lb

## 2015-11-28 DIAGNOSIS — M25571 Pain in right ankle and joints of right foot: Secondary | ICD-10-CM | POA: Diagnosis not present

## 2015-11-28 MED ORDER — PREDNISONE 10 MG (21) PO TBPK: ORAL_TABLET | ORAL | 1 refills | Status: DC

## 2015-11-28 NOTE — Progress Notes (Signed)
Subjective: my right heel and ankle hurt    Patient ID: Marissa [name], female    DOB: 05/11/1980, 35 y.o.   MRN: [id number]  HPI She has had pain of the right heel and posterior ankle for about three to four weeks gradually getting worse. She has no trauma, no redness, no swelling.  She has not changed shoe wear.  She has tried Advil, Tylenol, heat and ice with minimal results.  She works at [employer] and is having problems at work because of the pain.  She has no other joint pains.   Review of Systems  HENT: Negative for congestion.   Respiratory: Negative for cough and shortness of breath.   Cardiovascular: Negative for chest pain and leg swelling.  Endocrine: Negative for cold intolerance.  Musculoskeletal: Positive for arthralgias.  Allergic/Immunologic: Positive for environmental allergies.  Neurological: Positive for headaches.  Psychiatric/Behavioral: The patient is nervous/anxious.    Past Medical History:  Diagnosis Date  . Allergy    seasonal  . Anxiety   . Common migraine with intractable migraine 11/08/2015  . Hyperlipidemia   . Stroke (HCC)   . Stroke (HCC)   . Vitamin D deficiency     Past Surgical History:  Procedure Laterality Date  . CESAREAN SECTION    . 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 TUBAL LIGATION      Current Outpatient Prescriptions on File Prior to Visit  Medication Sig Dispense Refill  . aspirin 81 MG chewable tablet Chew 81 mg by mouth daily.    . cholecalciferol (VITAMIN D) 1000 units tablet Take 2,000 Units by mouth daily.    . clopidogrel (PLAVIX) 75 MG tablet Take 75 mg by mouth daily.    . naproxen (NAPROSYN) 500 MG tablet Take 1 tablet (500 mg total) by mouth 2 (two) times daily with a meal. 30 tablet 0  . topiramate (TOPAMAX) 25 MG tablet Take one tablet at night for one week, then take 2 tablets at night for one week, then take 3 tablets at night. 90 tablet 3   No current facility-administered medications on file prior to  visit.     Social History   Social History  . Marital status: Married    Spouse name: Christiane HaJonathan  . Number of children: 3  . Years of education: 12   Occupational History  . [employer]   Social History Main Topics  . Smoking status: Never Smoker  . Smokeless tobacco: Never Used  . Alcohol use No  . Drug use: No  . Sexual activity: Yes    Partners: Male    Birth control/ protection: Surgical     Comment: Married   Other Topics Concern  . Not on file   Social History Narrative   Lives with husband and 3 children   Right-handed   Caffeine: rare    Family History  Problem Relation Age of Onset  . Hypertension Mother   . Heart disease Maternal Uncle     murmur  . Heart disease Maternal Grandmother   . COPD Maternal Grandfather   . Cancer Maternal Grandfather     lung  . Heart disease Maternal Aunt     murmur  . Migraines Neg Hx     BP 116/78   Pulse 74   Temp 97.7 F (36.5 C)   Ht 5\' 2"  (1.575 m)   Wt 157 lb (71.2 kg)   LMP 11/06/2015 (Exact Date)  BMI 28.72 kg/m      Objective:   Physical Exam  Constitutional: She is oriented to person, place, and time. She appears well-developed and well-nourished.  HENT:  Head: Normocephalic and atraumatic.  Eyes: Conjunctivae and EOM are normal. Pupils are equal, round, and reactive to light.  Neck: Normal range of motion. Neck supple.  Cardiovascular: Normal rate, regular rhythm and intact distal pulses.   Pulmonary/Chest: Effort normal.  Abdominal: Soft.  Musculoskeletal: She exhibits tenderness (the right heel is tender posteriorly and at Achilles insertion.  there is no swelling or redness.  Achilles is intact.  Limp to the right.).  Neurological: She is alert and oriented to person, place, and time. She displays normal reflexes. No cranial nerve deficit. She exhibits normal muscle tone. Coordination normal.  Skin: Skin is warm and dry.  Psychiatric: She has a normal mood and affect. Her  behavior is normal. Judgment and thought content normal.     X-rays of the right ankle were done and reported separately.     Assessment & Plan:   Encounter Diagnosis  Name Primary?  . Pain in joint involving right ankle and foot Yes   I will give CAM walker.  I will give Rx for prednisone dose pack.  Rx for crutches given.  Stay out of work  Return in two weeks.  Electronically Signed Darreld McleanWayne Dandre Sisler, MD 11/8/20174:04 PM

## 2015-11-28 NOTE — Patient Instructions (Signed)
Out of work.  Cam walker applied.

## 2015-12-12 ENCOUNTER — Encounter: Payer: Self-pay | Admitting: Orthopaedic Surgery

## 2015-12-12 ENCOUNTER — Ambulatory Visit (INDEPENDENT_AMBULATORY_CARE_PROVIDER_SITE_OTHER): Payer: Medicaid Other | Admitting: Orthopaedic Surgery

## 2015-12-12 VITALS — BP 126/81 | HR 79 | Temp 97.7°F | Ht 62.0 in | Wt 157.0 lb

## 2015-12-12 DIAGNOSIS — M25571 Pain in right ankle and joints of right foot: Secondary | ICD-10-CM | POA: Diagnosis not present

## 2015-12-12 NOTE — Progress Notes (Signed)
Patient EA:VWUJWJXB:Marissa [name], female DOB:1980-12-02, 35 y.o. JYN:[id number]  Chief Complaint  Patient presents with  . Follow-up    Right ankle and heel pain    HPI  Marissa [name] is a 35 y.o. female who has right heel pain and she is better.  She is using a CAM walker.  She has less pain. She is doing stretching.  She is taking her medicine.   HPI  Body mass index is 28.72 kg/m.  ROS  Review of Systems  HENT: Negative for congestion.   Respiratory: Negative for cough and shortness of breath.   Cardiovascular: Negative for chest pain and leg swelling.  Endocrine: Negative for cold intolerance.  Musculoskeletal: Positive for arthralgias.  Allergic/Immunologic: Positive for environmental allergies.  Neurological: Positive for headaches.  Psychiatric/Behavioral: The patient is nervous/anxious.     Past Medical History:  Diagnosis Date  . Allergy    seasonal  . Anxiety   . Common migraine with intractable migraine 11/08/2015  . Hyperlipidemia   . Stroke (HCC)   . Stroke (HCC)   . Vitamin D deficiency     Past Surgical History:  Procedure Laterality Date  . CESAREAN SECTION    . 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 TUBAL LIGATION      Family History  Problem Relation Age of Onset  . Hypertension Mother   . Heart disease Maternal Uncle     murmur  . Heart disease Maternal Grandmother   . COPD Maternal Grandfather   . Cancer Maternal Grandfather     lung  . Heart disease Maternal Aunt     murmur  . Migraines Neg Hx     Social History Social History  Substance Use Topics  . Smoking status: Never Smoker  . Smokeless tobacco: Never Used  . Alcohol use No    Allergies  Allergen Reactions  . Morphine And Related Anaphylaxis and Other (See Comments)    Feels like skin is burning   . Triptans     Cannot use triptans for migraine per neurology due to stroke history    Current Outpatient Prescriptions  Medication Sig Dispense Refill  . aspirin  81 MG chewable tablet Chew 81 mg by mouth daily.    . cholecalciferol (VITAMIN D) 1000 units tablet Take 2,000 Units by mouth daily.    . clopidogrel (PLAVIX) 75 MG tablet Take 75 mg by mouth daily.    . naproxen (NAPROSYN) 500 MG tablet Take 1 tablet (500 mg total) by mouth 2 (two) times daily with a meal. 30 tablet 0  . predniSONE (STERAPRED UNI-PAK 21 TAB) 10 MG (21) TBPK tablet Take six pills the first day;5 pills the next day;4 pills the next day;3 pills the next day; 2 pills the next day,one the final day. 21 tablet 1  . topiramate (TOPAMAX) 25 MG tablet Take one tablet at night for one week, then take 2 tablets at night for one week, then take 3 tablets at night. 90 tablet 3   No current facility-administered medications for this visit.      Physical Exam  Blood pressure 126/81, pulse 79, temperature 97.7 F (36.5 C), height 5\' 2"  (1.575 m), weight 157 lb (71.2 kg), last menstrual period 11/06/2015.  Constitutional: overall normal hygiene, normal nutrition, well developed, normal grooming, normal body habitus. Assistive device:CAM walker  Musculoskeletal: gait and station Limp right, muscle tone and strength are normal, no tremors or atrophy is present.  .  Neurological: coordination overall normal.  Deep tendon reflex/nerve stretch intact.  Sensation normal.  Cranial nerves II-XII intact.   Skin:   Normal overall no scars, lesions, ulcers or rashes. No psoriasis.  Psychiatric: Alert and oriented x 3.  Recent memory intact, remote memory unclear.  Normal mood and affect. Well groomed.  Good eye contact.  Cardiovascular: overall no swelling, no varicosities, no edema bilaterally, normal temperatures of the legs and arms, no clubbing, cyanosis and good capillary refill.  Lymphatic: palpation is normal.  Her right heel is a little tender on the plantar surface. There is no redness.  There is no swelling.  NV intact. ROM is full.  The patient has been educated about the nature of  the problem(s) and counseled on treatment options.  The patient appeared to understand what I have discussed and is in agreement with it.  Encounter Diagnosis  Name Primary?  . Pain in joint involving right ankle and foot Yes    PLAN Call if any problems.  Precautions discussed.  Continue current medications.   Return to clinic 2 weeks   Electronically Signed Darreld McleanWayne Islam Villescas, MD 11/22/201711:30 AM

## 2015-12-12 NOTE — Patient Instructions (Signed)
Out of work 

## 2015-12-20 ENCOUNTER — Telehealth: Payer: Self-pay | Admitting: Orthopaedic Surgery

## 2015-12-20 NOTE — Telephone Encounter (Signed)
Faxed updated [facility] to [facility] for date of service 12/12/15; authorization on file.  Patient aware.

## 2015-12-26 ENCOUNTER — Ambulatory Visit (INDEPENDENT_AMBULATORY_CARE_PROVIDER_SITE_OTHER): Payer: Medicaid Other | Admitting: Orthopaedic Surgery

## 2015-12-26 ENCOUNTER — Encounter: Payer: Self-pay | Admitting: Orthopaedic Surgery

## 2015-12-26 VITALS — BP 112/76 | HR 68 | Temp 97.5°F | Ht 62.0 in | Wt 157.0 lb

## 2015-12-26 DIAGNOSIS — M25571 Pain in right ankle and joints of right foot: Secondary | ICD-10-CM | POA: Diagnosis not present

## 2015-12-26 NOTE — Patient Instructions (Signed)
Return to work 12-31-15 full duty.

## 2015-12-26 NOTE — Progress Notes (Signed)
Patient ZO:XWRUEAVW:Marissa [name], female DOB:1980/03/31, 35 y.o. UJW:[id number]  Chief Complaint  Patient presents with  . Follow-up    right ankle and heel    HPI  Marissa [name] is a 35 y.o. female who has had pain in the right foot and ankle.  She is much improved now. She has no pain, no limp. She wants to go back to work. HPI  Body mass index is 28.72 kg/m.  ROS  Review of Systems  HENT: Negative for congestion.   Respiratory: Negative for cough and shortness of breath.   Cardiovascular: Negative for chest pain and leg swelling.  Endocrine: Negative for cold intolerance.  Musculoskeletal: Positive for arthralgias.  Allergic/Immunologic: Positive for environmental allergies.  Neurological: Positive for headaches.  Psychiatric/Behavioral: The patient is nervous/anxious.     Past Medical History:  Diagnosis Date  . Allergy    seasonal  . Anxiety   . Common migraine with intractable migraine 11/08/2015  . Hyperlipidemia   . Stroke (HCC)   . Stroke (HCC)   . Vitamin D deficiency     Past Surgical History:  Procedure Laterality Date  . CESAREAN SECTION    . 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 TUBAL LIGATION      Family History  Problem Relation Age of Onset  . Hypertension Mother   . Heart disease Maternal Uncle     murmur  . Heart disease Maternal Grandmother   . COPD Maternal Grandfather   . Cancer Maternal Grandfather     lung  . Heart disease Maternal Aunt     murmur  . Migraines Neg Hx     Social History Social History  Substance Use Topics  . Smoking status: Never Smoker  . Smokeless tobacco: Never Used  . Alcohol use No    Allergies  Allergen Reactions  . Morphine And Related Anaphylaxis and Other (See Comments)    Feels like skin is burning   . Triptans     Cannot use triptans for migraine per neurology due to stroke history    Current Outpatient Prescriptions  Medication Sig Dispense Refill  . aspirin 81 MG chewable tablet  Chew 81 mg by mouth daily.    . cholecalciferol (VITAMIN D) 1000 units tablet Take 2,000 Units by mouth daily.    . clopidogrel (PLAVIX) 75 MG tablet Take 75 mg by mouth daily.    . naproxen (NAPROSYN) 500 MG tablet Take 1 tablet (500 mg total) by mouth 2 (two) times daily with a meal. 30 tablet 0  . predniSONE (STERAPRED UNI-PAK 21 TAB) 10 MG (21) TBPK tablet Take six pills the first day;5 pills the next day;4 pills the next day;3 pills the next day; 2 pills the next day,one the final day. 21 tablet 1  . topiramate (TOPAMAX) 25 MG tablet Take one tablet at night for one week, then take 2 tablets at night for one week, then take 3 tablets at night. 90 tablet 3   No current facility-administered medications for this visit.      Physical Exam  Blood pressure 112/76, pulse 68, temperature 97.5 F (36.4 C), height 5\' 2"  (1.575 m), weight 157 lb (71.2 kg), last menstrual period 11/06/2015.  Constitutional: overall normal hygiene, normal nutrition, well developed, normal grooming, normal body habitus. Assistive device:none  Musculoskeletal: gait and station Limp none, muscle tone and strength are normal, no tremors or atrophy is present.  .  Neurological: coordination overall normal.  Deep tendon reflex/nerve stretch intact.  Sensation normal.  Cranial nerves II-XII intact.   Skin:   Normal overall no scars, lesions, ulcers or rashes. No psoriasis.  Psychiatric: Alert and oriented x 3.  Recent memory intact, remote memory unclear.  Normal mood and affect. Well groomed.  Good eye contact.  Cardiovascular: overall no swelling, no varicosities, no edema bilaterally, normal temperatures of the legs and arms, no clubbing, cyanosis and good capillary refill.  Lymphatic: palpation is normal.  She has full ROM of both ankles and no pain.  Gait is normal.  The patient has been educated about the nature of the problem(s) and counseled on treatment options.  The patient appeared to understand what I  have discussed and is in agreement with it.  Encounter Diagnosis  Name Primary?  . Pain in joint involving right ankle and foot Yes    PLAN Call if any problems.  Precautions discussed.  Continue current medications.   Return to clinic PRN   Electronically Signed Darreld McleanWayne Alejos Reinhardt, MD 12/6/201711:27 AM

## 2016-01-01 ENCOUNTER — Encounter: Payer: Self-pay | Admitting: Family Medicine

## 2016-01-01 ENCOUNTER — Ambulatory Visit (INDEPENDENT_AMBULATORY_CARE_PROVIDER_SITE_OTHER): Payer: Medicaid Other | Admitting: Family Medicine

## 2016-01-01 VITALS — BP 118/70 | HR 76 | Temp 98.2°F | Resp 18 | Ht 62.0 in | Wt 162.0 lb

## 2016-01-01 DIAGNOSIS — J208 Acute bronchitis due to other specified organisms: Secondary | ICD-10-CM | POA: Diagnosis not present

## 2016-01-01 MED ORDER — BENZONATATE 200 MG PO CAPS: 200.0000 mg | ORAL_CAPSULE | Freq: Three times a day (TID) | ORAL | 0 refills | Status: DC | PRN

## 2016-01-01 NOTE — Patient Instructions (Addendum)
Finish the penicillin Push fluids Use humidifier Tessalon for cough Call if not better in a week   Viral Respiratory Infection Introduction A viral respiratory infection is an illness that affects parts of the body used for breathing, like the lungs, nose, and throat. It is caused by a germ called a virus. Some examples of this kind of infection are:  A cold.  The flu (influenza).  A respiratory syncytial virus (RSV) infection. How do I know if I have this infection? Most of the time this infection causes:  A stuffy or runny nose.  Yellow or green fluid in the nose.  A cough.  Sneezing.  Tiredness (fatigue).  Achy muscles.  A sore throat.  Sweating or chills.  A fever.  A headache. How is this infection treated? If the flu is diagnosed early, it may be treated with an antiviral medicine. This medicine shortens the length of time a person has symptoms. Symptoms may be treated with over-the-counter and prescription medicines, such as:  Expectorants. These make it easier to cough up mucus.  Decongestant nasal sprays. Doctors do not prescribe antibiotic medicines for viral infections. They do not work with this kind of infection. How do I know if I should stay home? To keep others from getting sick, stay home if you have:  A fever.  A lasting cough.  A sore throat.  A runny nose.  Sneezing.  Muscles aches.  Headaches.  Tiredness.  Weakness.  Chills.  Sweating.  An upset stomach (nausea). Follow these instructions at home:  Rest as much as possible.  Take over-the-counter and prescription medicines only as told by your doctor.  Drink enough fluid to keep your pee (urine) clear or pale yellow.  Gargle with salt water. Do this 3-4 times per day or as needed. To make a salt-water mixture, dissolve -1 tsp of salt in 1 cup of warm water. Make sure the salt dissolves all the way.  Use nose drops made from salt water. This helps with stuffiness  (congestion). It also helps soften the skin around your nose.  Do not drink alcohol.  Do not use tobacco products, including cigarettes, chewing tobacco, and e-cigarettes. If you need help quitting, ask your doctor. Get help if:  Your symptoms last for 10 days or longer.  Your symptoms get worse over time.  You have a fever.  You have very bad pain in your face or forehead.  Parts of your jaw or neck become very swollen. Get help right away if:  You feel pain or pressure in your chest.  You have shortness of breath.  You faint or feel like you will faint.  You keep throwing up (vomiting).  You feel confused. This information is not intended to replace advice given to you by your health care provider. Make sure you discuss any questions you have with your health care provider. Document Released: 12/20/2007 Document Revised: 06/14/2015 Document Reviewed: 06/14/2014  2017 [employer]vier

## 2016-01-01 NOTE — Progress Notes (Signed)
Chief Complaint  Patient presents with  . Cough    x 3 days   Cough, sore throat, headache for 5 days Is on PCN for dental infection since last week No strep exposure Cough is harsh and non productive No residual from stroke and general feels well  Patient Active Problem List   Diagnosis Date Noted  . Common migraine with intractable migraine 11/08/2015  . PFO (patent foramen ovale) 10/08/2015  . Cerebrovascular accident (CVA) due to embolism (HCC) 10/08/2015  . HLD (hyperlipidemia) 10/08/2015  . Vitamin D deficiency 10/08/2015    Outpatient Encounter Prescriptions as of 01/01/2016  Medication Sig  . aspirin 81 MG chewable tablet Chew 81 mg by mouth daily.  Marland Kitchen ibuprofen (ADVIL,MOTRIN) 800 MG tablet Take 800 mg by mouth every 8 (eight) hours as needed.  . penicillin v potassium (VEETID) 500 MG tablet Take 500 mg by mouth 4 (four) times daily.  . benzonatate (TESSALON) 200 MG capsule Take 1 capsule (200 mg total) by mouth 3 (three) times daily as needed for cough.  . cholecalciferol (VITAMIN D) 1000 units tablet Take 2,000 Units by mouth daily.  . clopidogrel (PLAVIX) 75 MG tablet Take 75 mg by mouth daily.  . [DISCONTINUED] naproxen (NAPROSYN) 500 MG tablet Take 1 tablet (500 mg total) by mouth 2 (two) times daily with a meal. (Patient not taking: Reported on 01/01/2016)  . [DISCONTINUED] predniSONE (STERAPRED UNI-PAK 21 TAB) 10 MG (21) TBPK tablet Take six pills the first day;5 pills the next day;4 pills the next day;3 pills the next day; 2 pills the next day,one the final day. (Patient not taking: Reported on 01/01/2016)  . [DISCONTINUED] topiramate (TOPAMAX) 25 MG tablet Take one tablet at night for one week, then take 2 tablets at night for one week, then take 3 tablets at night. (Patient not taking: Reported on 01/01/2016)   No facility-administered encounter medications on file as of 01/01/2016.     Allergies  Allergen Reactions  . Triptans     Cannot use triptans for  migraine per neurology due to stroke history  . Morphine And Related Hives    Feels like skin is burning     Review of Systems  Constitutional: Negative for chills, fatigue and fever.  HENT: Positive for congestion and sore throat. Negative for ear pain, postnasal drip, rhinorrhea, sinus pain and sinus pressure.   Eyes: Negative for redness and visual disturbance.  Respiratory: Positive for cough. Negative for shortness of breath and wheezing.   Cardiovascular: Negative for chest pain, palpitations and leg swelling.       Sternal pain with coughing  Gastrointestinal: Negative for abdominal pain and nausea.  Genitourinary: Negative for difficulty urinating.  Musculoskeletal: Negative for arthralgias and back pain.  Neurological: Positive for headaches. Negative for dizziness.  Psychiatric/Behavioral: Positive for sleep disturbance.       Cough    BP 118/70 (BP Location: Left Arm, Patient Position: Sitting, Cuff Size: Normal)   Pulse 76   Temp 98.2 F (36.8 C) (Oral)   Resp 18   Ht 5\' 2"  (1.575 m)   Wt 162 lb (73.5 kg)   LMP 12/27/2015 (Exact Date)   SpO2 100%   BMI 29.63 kg/m   Physical Exam  Constitutional: She is oriented to person, place, and time. She appears well-developed and well-nourished.  HENT:  Head: Normocephalic and atraumatic.  Right Ear: External ear normal.  Left Ear: External ear normal.  Mouth/Throat: Oropharynx is clear and moist.  Mild redness  tonsils  Eyes: Conjunctivae are normal. Pupils are equal, round, and reactive to light.  Neck: Normal range of motion. Neck supple. No thyromegaly present.  Cardiovascular: Normal rate, regular rhythm and normal heart sounds.   Pulmonary/Chest: Effort normal and breath sounds normal. No respiratory distress.  clear  Abdominal: Soft. Bowel sounds are normal.  Musculoskeletal: Normal range of motion. She exhibits no edema.  Lymphadenopathy:    She has no cervical adenopathy.  Neurological: She is alert and  oriented to person, place, and time.  Gait normal  Skin: Skin is warm and dry.  Psychiatric: She has a normal mood and affect. Her behavior is normal. Thought content normal.  Nursing note and vitals reviewed.   ASSESSMENT/PLAN:  1. Acute viral bronchitis  discussed   Patient Instructions  Finish the penicillin Push fluids Use humidifier Tessalon for cough Call if not better in a week   Viral Respiratory Infection Introduction A viral respiratory infection is an illness that affects parts of the body used for breathing, like the lungs, nose, and throat. It is caused by a germ called a virus. Some examples of this kind of infection are:  A cold.  The flu (influenza).  A respiratory syncytial virus (RSV) infection. How do I know if I have this infection? Most of the time this infection causes:  A stuffy or runny nose.  Yellow or green fluid in the nose.  A cough.  Sneezing.  Tiredness (fatigue).  Achy muscles.  A sore throat.  Sweating or chills.  A fever.  A headache. How is this infection treated? If the flu is diagnosed early, it may be treated with an antiviral medicine. This medicine shortens the length of time a person has symptoms. Symptoms may be treated with over-the-counter and prescription medicines, such as:  Expectorants. These make it easier to cough up mucus.  Decongestant nasal sprays. Doctors do not prescribe antibiotic medicines for viral infections. They do not work with this kind of infection. How do I know if I should stay home? To keep others from getting sick, stay home if you have:  A fever.  A lasting cough.  A sore throat.  A runny nose.  Sneezing.  Muscles aches.  Headaches.  Tiredness.  Weakness.  Chills.  Sweating.  An upset stomach (nausea). Follow these instructions at home:  Rest as much as possible.  Take over-the-counter and prescription medicines only as told by your doctor.  Drink enough  fluid to keep your pee (urine) clear or pale yellow.  Gargle with salt water. Do this 3-4 times per day or as needed. To make a salt-water mixture, dissolve -1 tsp of salt in 1 cup of warm water. Make sure the salt dissolves all the way.  Use nose drops made from salt water. This helps with stuffiness (congestion). It also helps soften the skin around your nose.  Do not drink alcohol.  Do not use tobacco products, including cigarettes, chewing tobacco, and e-cigarettes. If you need help quitting, ask your doctor. Get help if:  Your symptoms last for 10 days or longer.  Your symptoms get worse over time.  You have a fever.  You have very bad pain in your face or forehead.  Parts of your jaw or neck become very swollen. Get help right away if:  You feel pain or pressure in your chest.  You have shortness of breath.  You faint or feel like you will faint.  You keep throwing up (vomiting).  You feel  confused. This information is not intended to replace advice given to you by your health care provider. Make sure you discuss any questions you have with your health care provider. Document Released: 12/20/2007 Document Revised: 06/14/2015 Document Reviewed: 06/14/2014  2017 Elsevier    Eustace MooreYvonne Sue Nelson, MD

## 2016-03-06 ENCOUNTER — Telehealth: Payer: Self-pay | Admitting: Family Medicine

## 2016-03-06 NOTE — Telephone Encounter (Signed)
OPENED IN ERROR

## 2016-03-07 ENCOUNTER — Ambulatory Visit (INDEPENDENT_AMBULATORY_CARE_PROVIDER_SITE_OTHER): Payer: Medicaid Other | Admitting: Family Medicine

## 2016-03-07 ENCOUNTER — Encounter: Payer: Self-pay | Admitting: Family Medicine

## 2016-03-07 VITALS — BP 120/82 | HR 68 | Temp 98.6°F | Resp 16 | Ht 62.0 in | Wt 161.0 lb

## 2016-03-07 DIAGNOSIS — R6889 Other general symptoms and signs: Secondary | ICD-10-CM

## 2016-03-07 LAB — POCT RAPID STREP A (OFFICE): RAPID STREP A SCREEN: NEGATIVE

## 2016-03-07 NOTE — Progress Notes (Signed)
    Chief Complaint  Patient presents with  . Fatigue  . Headache    x 1 week   Headache and fatigue Mild sore throat and cough Her son just got over strep in end of Jan Her younger son had influenza early Feb She cared for both of them and did not get sick - until a few days ago.  Very exhausted and has body aches.  No sweats or chills or fever. Did NOT get a flu shot  Patient Active Problem List   Diagnosis Date Noted  . Common migraine with intractable migraine 11/08/2015  . PFO (patent foramen ovale) 10/08/2015  . Cerebrovascular accident (CVA) due to embolism (HCC) 10/08/2015  . HLD (hyperlipidemia) 10/08/2015  . Vitamin D deficiency 10/08/2015    Outpatient Encounter Prescriptions as of 03/07/2016  Medication Sig  . aspirin 81 MG chewable tablet Chew 81 mg by mouth daily.  . [DISCONTINUED] clopidogrel (PLAVIX) 75 MG tablet Take 75 mg by mouth daily. Patient stopped own medication   No facility-administered encounter medications on file as of 03/07/2016.     Allergies  Allergen Reactions  . Triptans     Cannot use triptans for migraine per neurology due to stroke history  . Morphine And Related Hives    Feels like skin is burning     Review of Systems  Constitutional: Positive for activity change and fatigue. Negative for chills, diaphoresis and fever.  HENT: Positive for sore throat. Negative for postnasal drip, rhinorrhea, sinus pain and sinus pressure.   Eyes: Negative for photophobia, redness and visual disturbance.  Respiratory: Positive for cough. Negative for shortness of breath and wheezing.   Cardiovascular: Negative for chest pain and palpitations.  Gastrointestinal: Negative for nausea and vomiting.  Genitourinary: Negative for difficulty urinating and flank pain.  Musculoskeletal: Negative for arthralgias and myalgias.  Neurological: Positive for headaches. Negative for dizziness.    BP 120/82 (BP Location: Right Arm, Patient Position: Sitting, Cuff  Size: Normal)   Pulse 68   Temp 98.6 F (37 C) (Temporal)   Resp 16   Ht 5\' 2"  (1.575 m)   Wt 161 lb 0.6 oz (73 kg)   LMP 02/22/2016 (Exact Date)   SpO2 100%   BMI 29.45 kg/m   Physical Exam  Constitutional: She is oriented to person, place, and time. She appears well-developed and well-nourished. She appears distressed.  Mildly ill.  Very tired.  HENT:  Head: Normocephalic and atraumatic.  Right Ear: External ear normal.  Left Ear: External ear normal.  Nose: Nose normal.  Mouth/Throat: Oropharynx is clear and moist.  Tonsils mildly red , Strep -  Eyes: Pupils are equal, round, and reactive to light.  Neck: Normal range of motion. Neck supple.  Cardiovascular: Normal rate, regular rhythm and normal heart sounds.   Pulmonary/Chest: Effort normal and breath sounds normal. She has no wheezes.  Abdominal: Soft. Bowel sounds are normal. There is no tenderness.  No HSM  Lymphadenopathy:    She has no cervical adenopathy.  Neurological: She is alert and oriented to person, place, and time.  Psychiatric: She has a normal mood and affect. Her behavior is normal.    ASSESSMENT/PLAN:  1. Flu-like symptoms  - POCT rapid strep A   Patient Instructions  Rest Push fluids Take over the counter cough and cold medicine if needed  Call if not improving by Monday   Eustace MooreYvonne Sue Jakeira Seeman, MD

## 2016-03-07 NOTE — Patient Instructions (Signed)
Rest Push fluids Take over the counter cough and cold medicine if needed  Call if not improving by Monday

## 2016-03-11 ENCOUNTER — Encounter: Payer: Self-pay | Admitting: Adult Health

## 2016-03-11 ENCOUNTER — Ambulatory Visit (INDEPENDENT_AMBULATORY_CARE_PROVIDER_SITE_OTHER): Payer: Medicaid Other | Admitting: Adult Health

## 2016-03-11 VITALS — BP 118/81 | HR 81 | Ht 62.0 in | Wt 160.6 lb

## 2016-03-11 DIAGNOSIS — R519 Headache, unspecified: Secondary | ICD-10-CM

## 2016-03-11 DIAGNOSIS — Z8673 Personal history of transient ischemic attack (TIA), and cerebral infarction without residual deficits: Secondary | ICD-10-CM

## 2016-03-11 DIAGNOSIS — R51 Headache: Secondary | ICD-10-CM

## 2016-03-11 NOTE — Progress Notes (Signed)
PATIENT: Marissa [name] DOB: 1980-06-14  REASON FOR VISIT: follow up- stroke, migraine headaches HISTORY FROM: patient  HISTORY OF PRESENT ILLNESS: Marissa [name] is a 36 year old female with a history of migraine headaches and small right frontal stroke. She returns today for follow-up. She reports that her headache frequency has decreased. She states that she has approximately one headache every 2 weeks. Her headaches typically occur in the right temporal region. She denies photophobia, phonophobia, nausea and vomiting. She reports that her headaches are usually a 5 on the pain scale. She typically can take ibuprofen and her headache resolves within one hour. She did try taking Topamax however he caused her to have hives. She hasn't had any additional strokelike symptoms. She did have a PFO closure. She states that on Sunday she developed some chest pain specifically with breathing. She reports that it also hurt to touch her chest. She reports that she did take aspirin and that was beneficial. She states that over the next day or so her chest pain has improved. Denies any pain in the jaw or down the arm. She did not go to the hospital or advised her PCP. She reports that she has an appointment March 9 with her cardiologist Dr. Mayford Knife. She returns today for an evaluation.  HISTORY (WILLIS): Marissa [name] is a 36 year old right-handed black female with a history of migraine headaches since she was a teenager. Her headaches are bifrontal in nature, and occur 2 or 3 times a week. The headaches may be associated with photophobia, and nausea without vomiting. The patient does not note any particular activating factors for her headache, but her headache does improve with sleep. She takes ibuprofen for the headache, she has never been on a daily prophylactic medication for the headache. Headache may interfere with her ability to work at times. She has never had any strokelike symptoms with her headache  previously. Around 09/01/2015, she was seen at the [facility] emergency room with headache and left-sided numbness. A CT scan of the brain was done, this was unremarkable. The patient had ongoing symptoms over the next 2 days, and she went to [facility] on August 14, and from there was sent to [facility]. The patient was found to have a small right frontal stroke. MRA of the head was unremarkable, carotid Doppler studies were unremarkable, a 2-D echocardiogram showed an ejection fraction of 55-60% with some left atrial enlargement, grade 1 diastolic dysfunction. A TEE was done and confirmed the presence of a PFO. A hypercoagulable state workup was negative, venous Doppler studies were unremarkable. No evidence of a DVT was noted. The patient was placed on low-dose aspirin, 81 mg. She was not on birth control pills. I do not see that a urine drug screen was checked. The patient has had severe fatigue since the onset of the stroke, she apparently came back 2 weeks later and had closure of the PFO. The patient had some chest discomfort since that time. The patient indicates that she is sleeping fairly well. She continues to have her headaches. She denies any family history of headache. She has had some problems with anxiety and panic disorder, she has been kept out of work until 12/02/2015.   REVIEW OF SYSTEMS: Out of a complete 14 system review of symptoms, the patient complains only of the following symptoms, and all other reviewed systems are negative.  Cough, shortness of breath, chest pain, chills, fatigue  ALLERGIES: Allergies  Allergen Reactions  . Triptans  Cannot use triptans for migraine per neurology due to stroke history  . Morphine And Related Hives    Feels like skin is burning     HOME MEDICATIONS: Outpatient Medications Prior to Visit  Medication Sig Dispense Refill  . aspirin 81 MG chewable tablet Chew 81 mg by mouth daily.     No facility-administered medications  prior to visit.     PAST MEDICAL HISTORY: Past Medical History:  Diagnosis Date  . Allergy    seasonal  . Anxiety   . Common migraine with intractable migraine 11/08/2015  . Hyperlipidemia   . Stroke (HCC)   . Stroke (HCC)   . Vitamin D deficiency     PAST SURGICAL HISTORY: Past Surgical History:  Procedure Laterality Date  . CESAREAN SECTION    . 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 TUBAL LIGATION      FAMILY HISTORY: Family History  Problem Relation Age of Onset  . Hypertension Mother   . Heart disease Maternal Uncle     murmur  . Heart disease Maternal Grandmother   . COPD Maternal Grandfather   . Cancer Maternal Grandfather     lung  . Heart disease Maternal Aunt     murmur  . Migraines Neg Hx     SOCIAL HISTORY: Social History   Social History  . Marital status: Married    Spouse name: Christiane HaJonathan  . Number of children: 3  . Years of education: 12   Occupational History  . [employer]   Social History Main Topics  . Smoking status: Never Smoker  . Smokeless tobacco: Never Used  . Alcohol use No  . Drug use: No  . Sexual activity: Yes    Partners: Male    Birth control/ protection: Surgical     Comment: Married   Other Topics Concern  . Not on file   Social History Narrative   Lives with husband and 3 children   Right-handed   Caffeine: rare      PHYSICAL EXAM  Vitals:   03/11/16 0954  BP: 118/81  Pulse: 81  Weight: 160 lb 9.6 oz (72.8 kg)  Height: 5\' 2"  (1.575 m)   Body mass index is 29.37 kg/m.  Generalized: Well developed, in no acute distress   Neurological examination  Mentation: Alert oriented to time, place, history taking. Follows all commands speech and language fluent Cranial nerve II-XII: Pupils were equal round reactive to light. Extraocular movements were full, visual field were full on confrontational test. Facial sensation and strength were normal. Uvula tongue midline. Head turning and  shoulder shrug  were normal and symmetric. Motor: The motor testing reveals 5 over 5 strength of all 4 extremities. Good symmetric motor tone is noted throughout.  Sensory: Sensory testing is intact to soft touch on all 4 extremities. No evidence of extinction is noted.  Coordination: Cerebellar testing reveals good finger-nose-finger and heel-to-shin bilaterally.  Gait and station: Gait is normal. Tandem gait is normal. Romberg is negative. No drift is seen.  Reflexes: Deep tendon reflexes are symmetric and normal bilaterally.   DIAGNOSTIC DATA (LABS, IMAGING, TESTING) - I reviewed patient records, labs, notes, testing and imaging myself where available.  Lab Results  Component Value Date   WBC 6.3 09/01/2015   HGB 12.0 09/01/2015   HCT 34.9 (L) 09/01/2015   MCV 93.3 09/01/2015   PLT 167 09/01/2015      Component Value Date/Time   NA 135  09/01/2015 1829   K 3.5 09/01/2015 1829   CL 107 09/01/2015 1829   CO2 24 09/01/2015 1829   GLUCOSE 98 09/01/2015 1829   BUN 17 09/01/2015 1829   CREATININE 0.92 09/01/2015 1829   CALCIUM 8.5 (L) 09/01/2015 1829   PROT 6.7 09/01/2015 1829   ALBUMIN 3.7 09/01/2015 1829   AST 22 09/01/2015 1829   ALT 23 09/01/2015 1829   ALKPHOS 42 09/01/2015 1829   BILITOT 0.7 09/01/2015 1829   GFRNONAA >60 09/01/2015 1829   GFRAA >60 09/01/2015 1829      ASSESSMENT AND PLAN 36 y.o. year old female  has a past medical history of Allergy; Anxiety; Common migraine with intractable migraine (11/08/2015); Hyperlipidemia; Stroke [facility]); Stroke [facility]); and Vitamin D deficiency. here with:  1. Headaches 2. History of stroke  Patient's headache frequency has decreased. For now we will  continue to monitor. The patient will continue using ibuprofen for her headaches. It is thought that the patient's stroke was caused by PFO. This has since been closed. I did review other risk factors for stroke with the patient. Advised that she should maintain strict control of  her blood pressure with goal less than 130/90. Cholesterol LDL less than 70 and hemoglobin A1c less than 6.5%. The patient voiced understanding. Also advised that if her chest pain worsens aware or go to the emergency room. Patient voiced understanding. She will follow-up in one year or sooner if needed.    Butch Penny, MSN, NP-C 03/11/2016, 10:00 AM [facility] [address], [address] [place], [place] [address] [phone]

## 2016-03-11 NOTE — Progress Notes (Signed)
I have read the note, and I agree with the clinical assessment and plan.  Mischa Pollard KEITH   

## 2016-03-11 NOTE — Patient Instructions (Signed)
If headaches increase in frequency please let us know Stroke prevention: Keep BP <13090 Cholesterol LDL <70 HgbA1c  <6.5 % If your symptoms worsen or you develop new symptoms please let us know.

## 2016-04-10 ENCOUNTER — Telehealth: Payer: Self-pay | Admitting: Adult Health

## 2016-04-10 NOTE — Telephone Encounter (Signed)
I spoke topt.  She experienced when clocking out of work, an rush in her head, lightheaded, like she was going to pass out.  She could comprehend what she was doing, but just starring.  This went away after 2 minutes.  She just feels like she is walking on water.  No headache.  No unilateral weakness, vision, slurred speech.  She saw us back in 03/11/16 and was not consistent in taking aspirin 81mg  daily.  But since then she has been taking daily.  I told her that hard to say what it was could have been, ? TIA.  If has another episode to seek ER care.  She may want to call cardiology since she had pfo repair.

## 2016-04-10 NOTE — Telephone Encounter (Signed)
Pt called stating all of a sudden she is having symptoms that she had right before last stroke.  She is in a very bad state of dizziness.

## 2016-04-11 NOTE — Telephone Encounter (Signed)
Ok with plan. I had a verbal conversation with Andrey CampanileSandy RN on 3/22. Advised that the patient could come in sooner to see Dr. Pearlean BrownieSethi if needed.

## 2016-04-11 NOTE — Telephone Encounter (Signed)
Called pt and she had not had any other issues other then slight fatigue and headache.  I told her that will touch base with Dr. Marlis EdelsonSethi's nurse and see about coming in sooner.  If has another episode to seek care at urgent care.  She verbalized understanding.

## 2016-04-14 NOTE — Telephone Encounter (Signed)
Called and LVM for pt to call. Advised there are some appt tomorrow.  You can offer 10am or 2pm. Those are open as of right now.  Gave [facility] phone number

## 2016-04-14 NOTE — Telephone Encounter (Signed)
Per.Dr.Sethi pt was never seen by him or Dr. Roda ShuttersXu in the hospital. There are not notes from them per epic. Pt needs to follow up with DR.Willis who saw her last in 10/2015.Thanks

## 2016-04-14 NOTE — Telephone Encounter (Signed)
I called the patient. The patient is having episodes over the last sural days of feeling lightheaded for about 2 minutes, then the event will pass. The patient has no headache, no vision changes, no focal numbness or weakness of the face, arms, or legs. She may feel somewhat fatigued and wiped out afterwards.  The events only occur with standing, not with sitting or lying down.  I have asked her to come in tomorrow at 10:00 in the morning, she indicates that she has no insurance, she cannot afford to come to our office.  I have indicated that if the event persists or your focal signs, she is to go to the emergency room. Otherwise, she is to remain on her aspirin, she is to drink plenty of fluids, and if she feels dizzy, sit down.

## 2016-04-14 NOTE — Telephone Encounter (Signed)
This is pt of Dr. Anne HahnWillis.

## 2017-01-30 ENCOUNTER — Emergency Department (HOSPITAL_COMMUNITY)
Admission: EM | Admit: 2017-01-30 | Discharge: 2017-01-30 | Disposition: A | Discharge status: Home / Self Care | Payer: Medicaid Other | Attending: Emergency Medicine | Admitting: Emergency Medicine

## 2017-01-30 ENCOUNTER — Encounter (HOSPITAL_COMMUNITY): Payer: Self-pay

## 2017-01-30 ENCOUNTER — Emergency Department (HOSPITAL_COMMUNITY): Payer: Medicaid Other

## 2017-01-30 DIAGNOSIS — Y658 Other specified misadventures during surgical and medical care: Secondary | ICD-10-CM | POA: Insufficient documentation

## 2017-01-30 DIAGNOSIS — R479 Unspecified speech disturbances: Secondary | ICD-10-CM | POA: Diagnosis not present

## 2017-01-30 DIAGNOSIS — T887XXA Unspecified adverse effect of drug or medicament, initial encounter: Secondary | ICD-10-CM | POA: Diagnosis not present

## 2017-01-30 DIAGNOSIS — Z8673 Personal history of transient ischemic attack (TIA), and cerebral infarction without residual deficits: Secondary | ICD-10-CM | POA: Insufficient documentation

## 2017-01-30 DIAGNOSIS — R5383 Other fatigue: Secondary | ICD-10-CM | POA: Diagnosis not present

## 2017-01-30 DIAGNOSIS — R531 Weakness: Secondary | ICD-10-CM | POA: Diagnosis not present

## 2017-01-30 DIAGNOSIS — M549 Dorsalgia, unspecified: Secondary | ICD-10-CM | POA: Diagnosis not present

## 2017-01-30 DIAGNOSIS — Z885 Allergy status to narcotic agent status: Secondary | ICD-10-CM | POA: Insufficient documentation

## 2017-01-30 DIAGNOSIS — R42 Dizziness and giddiness: Secondary | ICD-10-CM | POA: Insufficient documentation

## 2017-01-30 LAB — CBC
HCT: 35.7 % — ABNORMAL LOW (ref 36.0–46.0)
HEMOGLOBIN: 11.8 g/dL — AB (ref 12.0–15.0)
MCH: 31.3 pg (ref 26.0–34.0)
MCHC: 33.1 g/dL (ref 30.0–36.0)
MCV: 94.7 fL (ref 78.0–100.0)
Platelets: 163 10*3/uL (ref 150–400)
RBC: 3.77 MIL/uL — AB (ref 3.87–5.11)
RDW: 13.3 % (ref 11.5–15.5)
WBC: 4.5 10*3/uL (ref 4.0–10.5)

## 2017-01-30 LAB — BASIC METABOLIC PANEL
ANION GAP: 11 (ref 5–15)
BUN: 16 mg/dL (ref 6–20)
CHLORIDE: 105 mmol/L (ref 101–111)
CO2: 20 mmol/L — ABNORMAL LOW (ref 22–32)
Calcium: 8.9 mg/dL (ref 8.9–10.3)
Creatinine, Ser: 0.75 mg/dL (ref 0.44–1.00)
GFR calc Af Amer: >60 mL/min (ref >60)
GFR calc non Af Amer: >60 mL/min (ref >60)
Glucose, Bld: 91 mg/dL (ref 65–99)
POTASSIUM: 3.5 mmol/L (ref 3.5–5.1)
Sodium: 136 mmol/L (ref 135–145)

## 2017-01-30 LAB — URINALYSIS, ROUTINE W REFLEX MICROSCOPIC
Bilirubin Urine: NEGATIVE
Glucose, UA: NEGATIVE mg/dL
HGB URINE DIPSTICK: NEGATIVE
Ketones, ur: NEGATIVE mg/dL
LEUKOCYTES UA: NEGATIVE
Nitrite: NEGATIVE
PROTEIN: NEGATIVE mg/dL
SPECIFIC GRAVITY, URINE: 1.012 (ref 1.005–1.030)
pH: 5 (ref 5.0–8.0)

## 2017-01-30 LAB — PREGNANCY, URINE: Preg Test, Ur: NEGATIVE

## 2017-01-30 IMAGING — CT CT HEAD W/O CM
3 series · 15 of 47 positions shown, 18 images · non-contrast
Comparison: [DATE]

CLINICAL DATA: Weakness and speech disturbance at work.

EXAM:
CT HEAD WITHOUT CONTRAST
TECHNIQUE: Contiguous axial images were obtained from the base of the skull
through the vertex without intravenous contrast.

[Series 2: head wo · axial · 0.47mm/px · z∈[+26,+156]mm · 9 of 32 slices shown, 12 images]
[im 3/32  brain]
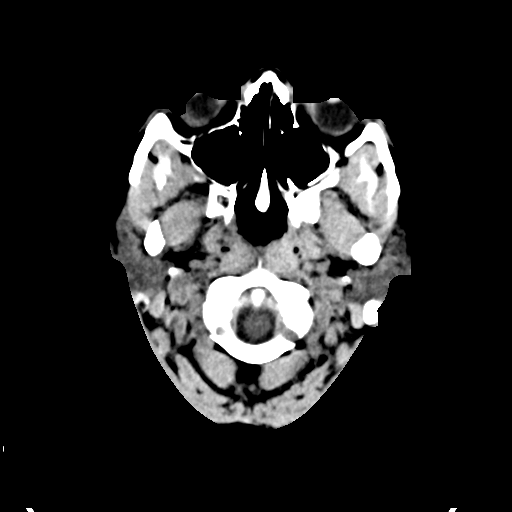
[im 3/32  bone]
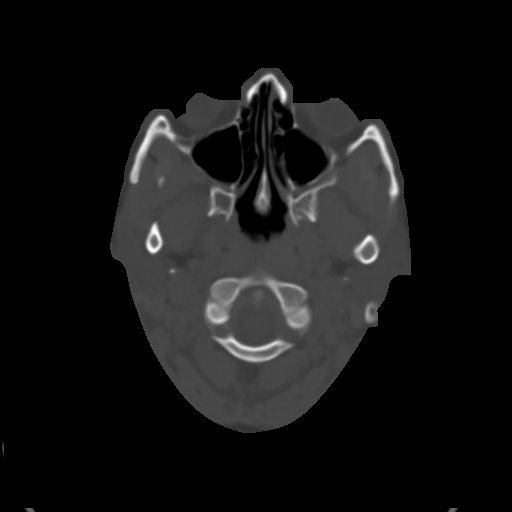
[im 6/32  brain]
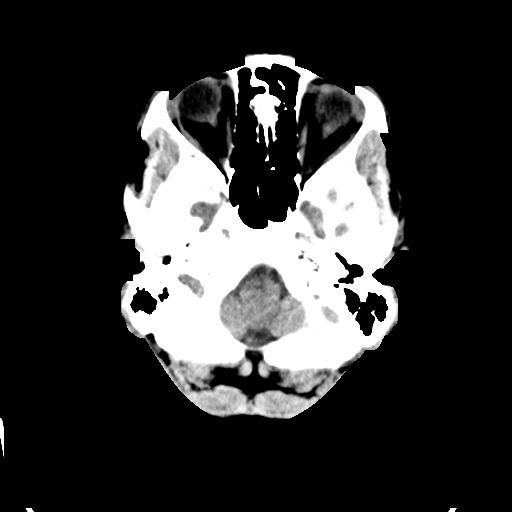
[im 9/32  brain]
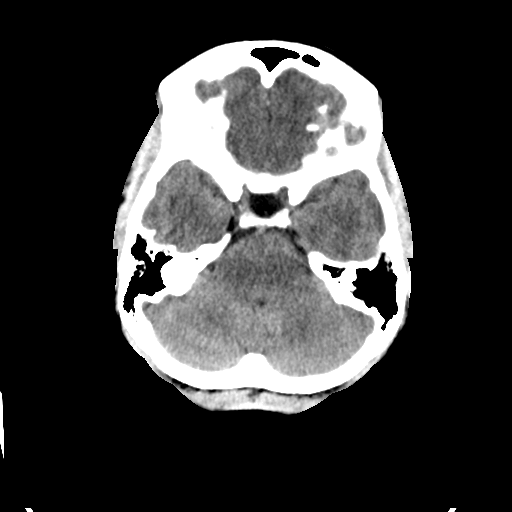
[im 12/32  brain]
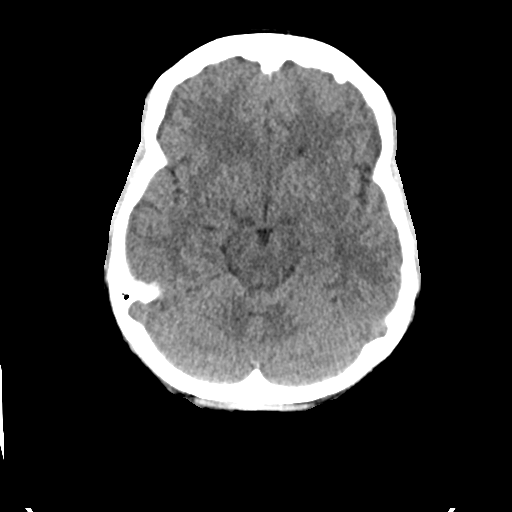
[im 17/32  brain]
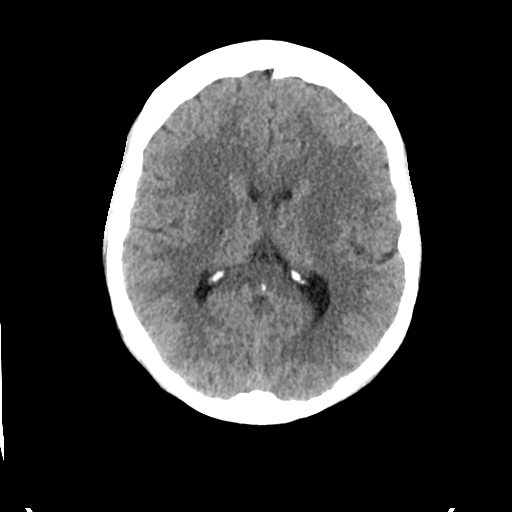
[im 17/32  bone]
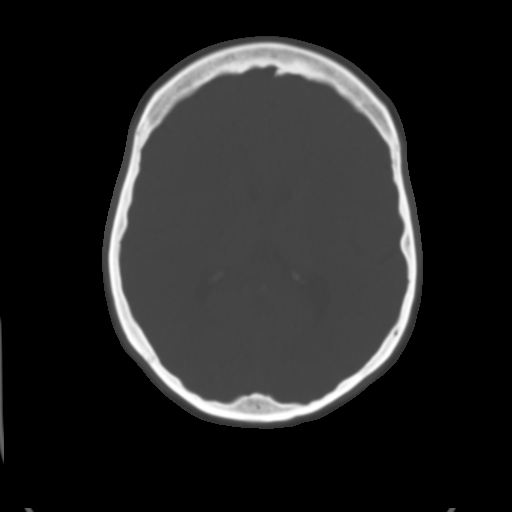
[im 20/32  brain]
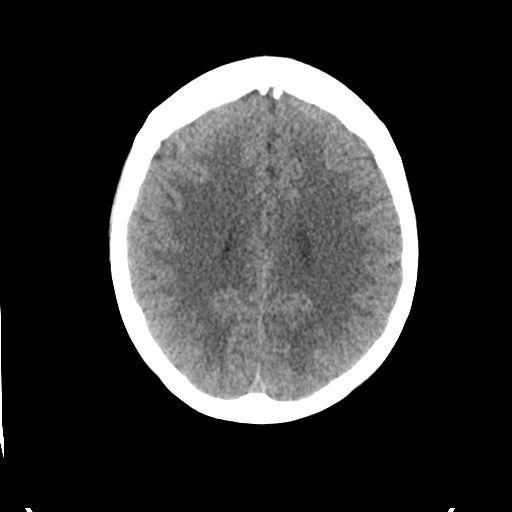
[im 23/32  brain]
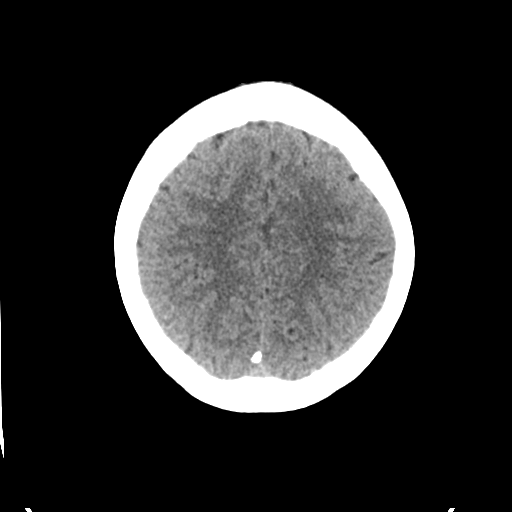
[im 26/32  brain]
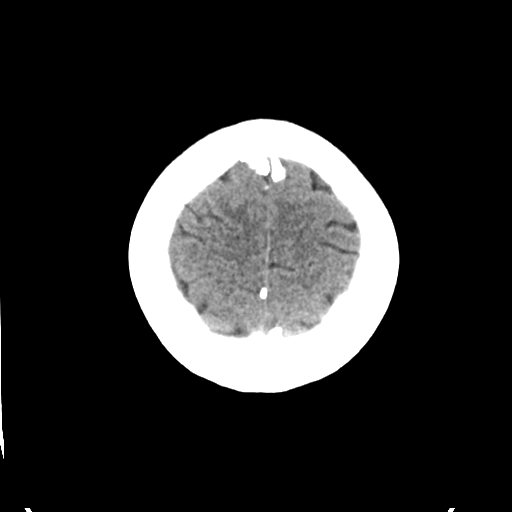
[im 29/32  brain]
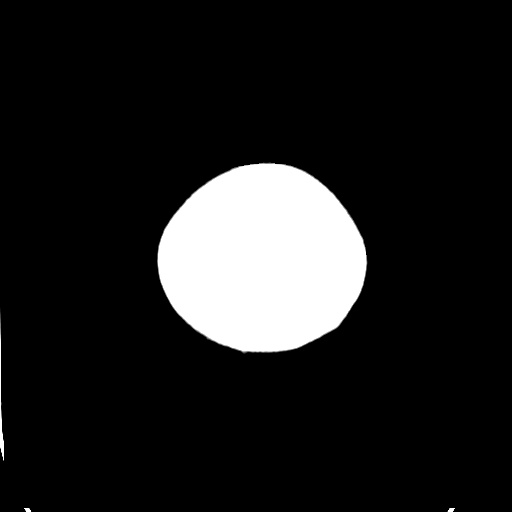
[im 29/32  bone]
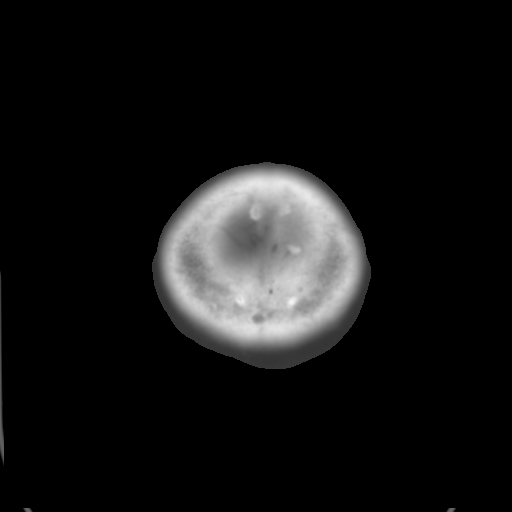

[Series 4: coronal soft tissue · coronal · 0.34mm/px · 3 of 68 slices shown]
[im 23/68  brain]
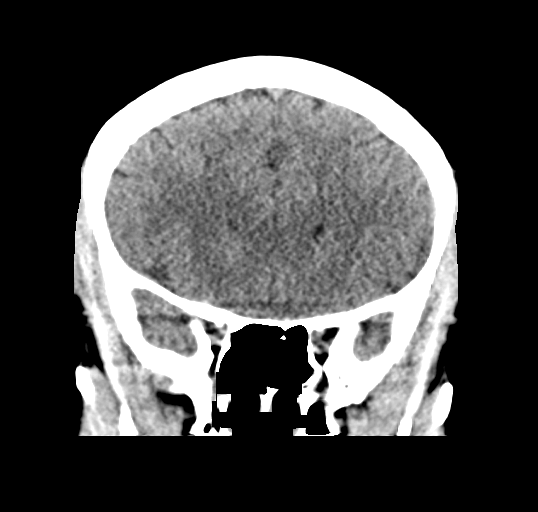
[im 30/68  brain]
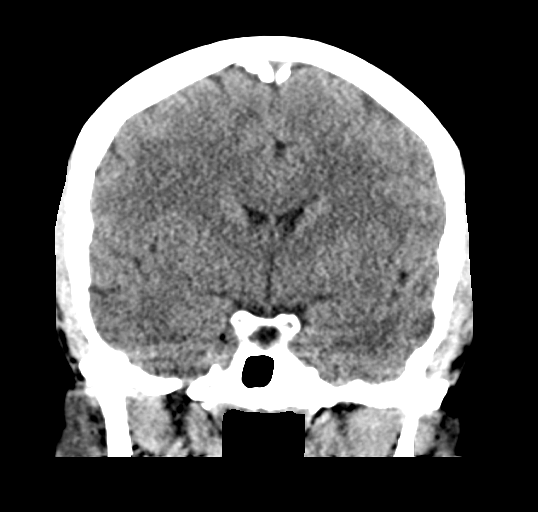
[im 38/68  brain]
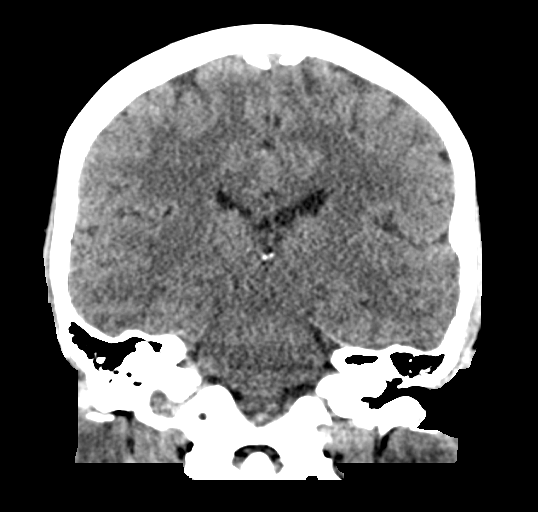

[Series 5: sagittal soft tissue · sagittal · 0.32mm/px · 3 of 57 slices shown]
[im 19/57  brain]
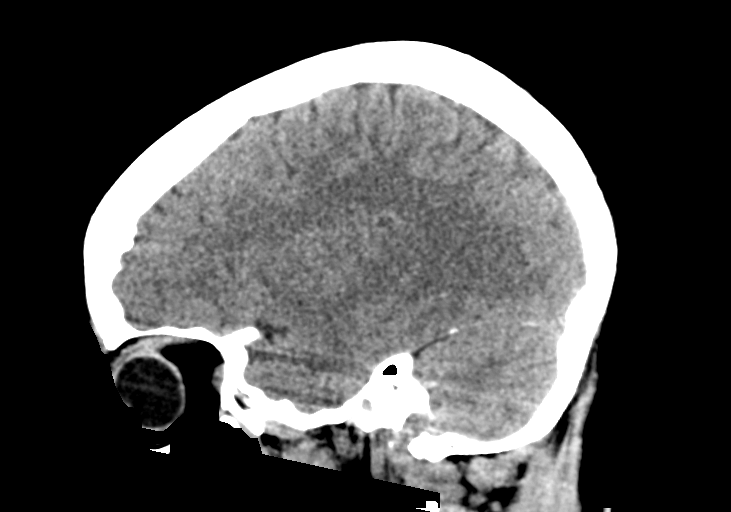
[im 29/57  brain]
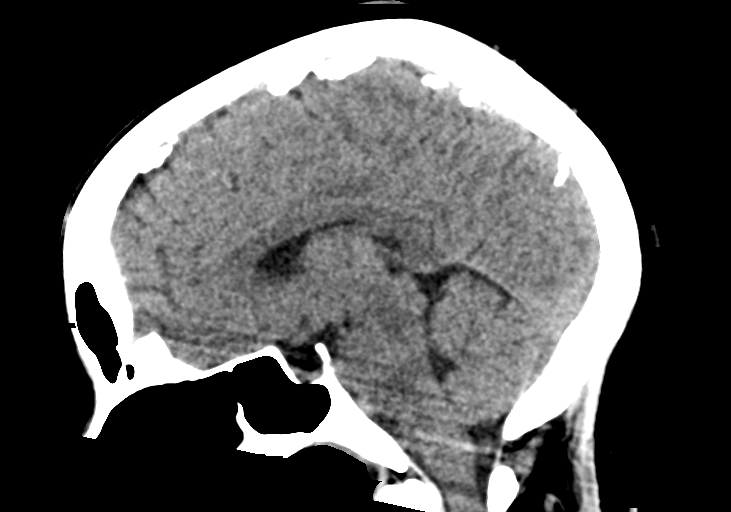
[im 38/57  brain]
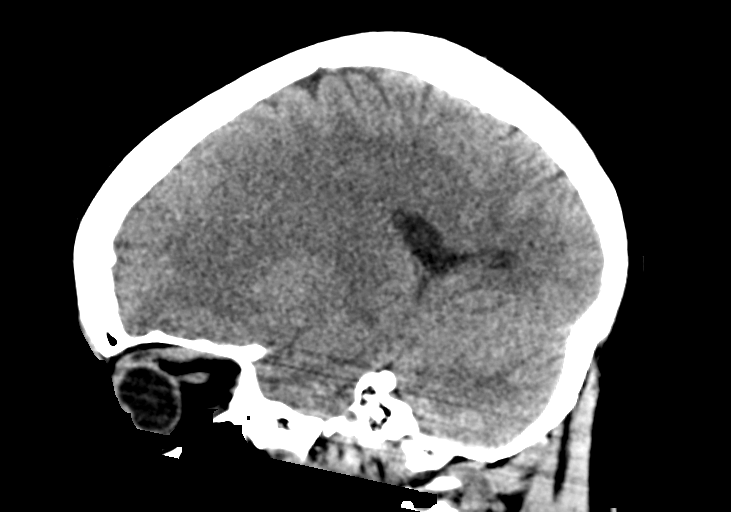

[15 of 47 positions shown; findings below may reference images not displayed]

FINDINGS: Brain: The brain shows a normal appearance without evidence of
malformation, atrophy, old or acute small or large vessel
infarction, mass lesion, hemorrhage, hydrocephalus or extra-axial
collection.

Vascular: No hyperdense vessel. No evidence of atherosclerotic
calcification.

Skull: Normal.  No traumatic finding.  No focal bone lesion.

Sinuses/Orbits: Sinuses are clear. Orbits appear normal. Mastoids
are clear.

Other: None significant
IMPRESSION: Normal head CT

## 2017-01-30 MED ORDER — SODIUM CHLORIDE 0.9 % IV SOLN: Freq: Once | INTRAVENOUS | Status: DC

## 2017-01-30 MED ORDER — SODIUM CHLORIDE 0.9 % IV BOLUS (SEPSIS)
1000.0000 mL | Freq: Once | INTRAVENOUS | Status: DC
Start: 2017-01-30 — End: 2017-01-30

## 2017-01-30 MED ORDER — ACETAMINOPHEN 500 MG PO TABS
1000.0000 mg | ORAL_TABLET | Freq: Once | ORAL | Status: AC
  Administered 2017-01-30: 1000 mg via ORAL
  Filled 2017-01-30: qty 2

## 2017-01-30 NOTE — ED Notes (Signed)
Pt transported to MRI 

## 2017-01-30 NOTE — ED Triage Notes (Addendum)
Pt brought in by EMS from her job at [employer] . Pt reports that she became weak, very tired and had difficulty speaking. Ems called out at 1052. Speech was very faint when EMS arrived. No neuro deficits at the time. CBG 102 BP 142/100. Pt reports taking a flexeril at approx 0630 for back pain Pt had never had a flexeril in the past and reports medication made her sleepy at work. Pt reports a burning feeling in throat and has a hx of stroke a year ago. Pt states approx 1030 felt a thumb on right side of head and then felt dizzy and warm

## 2017-01-30 NOTE — ED Provider Notes (Addendum)
[facility] Provider Note   CSN: [id number] Arrival date & time: 01/30/17  1129     History   Chief Complaint Chief Complaint  Patient presents with  . Weakness    HPI Marissa [name] is a 37 y.o. female.  Patient presents with multiple different symptoms. At around 10:30 this morning patient felt generally weak and felt like she was given a pass out. Patient felt like she couldn't get her words out at the same time. Patient feels her symptoms have improved since then. Her symptoms did gradually worse since taking Flexeril for the first time at approximately 6:30 for back pain that she's had. Back pain is worse with movement and palpation. Patient denies any neurologic deficits with the pain. No urinary symptoms. Patient had a stroke from PFO that was repaired. Patient had no headache today.      Past Medical History:  Diagnosis Date  . Allergy    seasonal  . Anxiety   . Common migraine with intractable migraine 11/08/2015  . Hyperlipidemia   . Stroke (HCC)   . Stroke (HCC)   . Vitamin D deficiency     Patient Active Problem List   Diagnosis Date Noted  . Common migraine with intractable migraine 11/08/2015  . PFO (patent foramen ovale) 10/08/2015  . Cerebrovascular accident (CVA) due to embolism (HCC) 10/08/2015  . HLD (hyperlipidemia) 10/08/2015  . Vitamin D deficiency 10/08/2015    Past Surgical History:  Procedure Laterality Date  . CESAREAN SECTION    . 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 TUBAL LIGATION      OB History    Gravida Para Term Preterm AB Living   1             SAB TAB Ectopic Multiple Live Births                   Home Medications    Prior to Admission medications   Not on File    Family History Family History  Problem Relation Age of Onset  . Hypertension Mother   . Heart disease Maternal Uncle        murmur  . Heart disease Maternal Grandmother   . COPD Maternal Grandfather   . Cancer  Maternal Grandfather        lung  . Heart disease Maternal Aunt        murmur  . Migraines Neg Hx     Social History Social History   Tobacco Use  . Smoking status: Never Smoker  . Smokeless tobacco: Never Used  Substance Use Topics  . Alcohol use: No  . Drug use: No     Allergies   Triptans and Morphine and related   Review of Systems Review of Systems  Constitutional: Positive for fatigue. Negative for chills and fever.  HENT: Negative for congestion.   Eyes: Negative for visual disturbance.  Respiratory: Negative for shortness of breath.   Cardiovascular: Negative for chest pain.  Gastrointestinal: Negative for abdominal pain and vomiting.  Genitourinary: Negative for dysuria and flank pain.  Musculoskeletal: Positive for back pain. Negative for neck pain and neck stiffness.  Skin: Negative for rash.  Neurological: Positive for light-headedness. Negative for syncope, weakness, numbness and headaches.     Physical Exam Updated Vital Signs BP 120/69 (BP Location: Right Arm)   Pulse 75   Temp 98.4 F (36.9 C) (Oral)   Resp 13   Ht 5\' 2"  (1.575 m)  Wt 72.6 kg (160 lb)   LMP 01/09/2017   SpO2 100%   BMI 29.26 kg/m   Physical Exam  Constitutional: She is oriented to person, place, and time. She appears well-developed and well-nourished.  HENT:  Head: Normocephalic and atraumatic.  Dry mucous membranes  Eyes: Conjunctivae are normal. Right eye exhibits no discharge. Left eye exhibits no discharge.  Neck: Normal range of motion. Neck supple. No tracheal deviation present.  Cardiovascular: Normal rate and regular rhythm.  Pulmonary/Chest: Effort normal and breath sounds normal.  Abdominal: Soft. She exhibits no distension. There is no tenderness. There is no guarding.  Musculoskeletal: She exhibits no edema.  Neurological: She is alert and oriented to person, place, and time. GCS eye subscore is 4. GCS verbal subscore is 5. GCS motor subscore is 6.  5+  strength in UE and LE with f/e at major joints. Sensation to palpation intact in UE and LE. CNs 2-12 grossly intact.  EOMFI.  PERRL.   Finger nose and coordination intact bilateral.   Visual fields intact to finger testing. No nystagmus   Skin: Skin is warm. No rash noted.  Psychiatric: She has a normal mood and affect.  Nursing note and vitals reviewed.    ED Treatments / Results  Labs (all labs ordered are listed, but only abnormal results are displayed) Labs Reviewed  CBC - Abnormal; Notable for the following components:      Result Value   RBC 3.77 (*)    Hemoglobin 11.8 (*)    HCT 35.7 (*)    All other components within normal limits  BASIC METABOLIC PANEL - Abnormal; Notable for the following components:   CO2 20 (*)    All other components within normal limits  URINALYSIS, ROUTINE W REFLEX MICROSCOPIC - Abnormal; Notable for the following components:   APPearance HAZY (*)    All other components within normal limits  PREGNANCY, URINE    EKG  EKG Interpretation  Date/Time:  Friday January 30 2017 11:41:36 EST Ventricular Rate:  76 PR Interval:    QRS Duration: 104 QT Interval:  371 QTC Calculation: 418 R Axis:   50 Text Interpretation:  Sinus rhythm RSR' in V1 or V2, probably normal variant Confirmed by Blane Ohara [phone]) on 01/30/2017 1:33:26 PM       Radiology No results found.  Procedures Procedures (including critical care time)  Medications Ordered in ED Medications  sodium chloride 0.9 % bolus 1,000 mL (1,000 mLs Intravenous Given by EMS 01/30/17 1208)  acetaminophen (TYLENOL) tablet 1,000 mg (1,000 mg Oral Given 01/30/17 1207)     Initial Impression / Assessment and Plan / ED Course  I have reviewed the triage vital signs and the nursing notes.  Pertinent labs & imaging results that were available during my care of the patient were reviewed by me and considered in my medical decision making (see chart for details).    Patient presents  with general lightheadedness and weakness. Normal neurologic exam on arrival. Although patient did have a stroke she had the PFO repaired and is low risk for stroke.Likely related to flexeril taken.  Patient does not present as an acute stroke. Patient's symptoms have resolved and has done well the ER with IV fluids and monitoring. Blood work and urinalysis unremarkable.   Prior to discharge patient still fatigued, discussed her transient loss of speech. Plan for MRI and further obs.   Patient unable to tolerate MRI. Patient has normal neurologic exam on recheck. Screening head CT unremarkable.  Patient understands strict reasons to return outpatient follow-up Results and differential diagnosis were discussed with the patient/parent/guardian. Xrays were independently reviewed by myself.  Close follow up outpatient was discussed, comfortable with the plan.   Medications  sodium chloride 0.9 % bolus 1,000 mL (1,000 mLs Intravenous Given by EMS 01/30/17 1208)  acetaminophen (TYLENOL) tablet 1,000 mg (1,000 mg Oral Given 01/30/17 1207)    Vitals:   01/30/17 1139 01/30/17 1141 01/30/17 1143  BP:   120/69  Pulse:   75  Resp:  20 13  Temp:  98.4 F (36.9 C) 98.4 F (36.9 C)  TempSrc:  Oral Oral  SpO2:  99% 100%  Weight: 72.6 kg (160 lb)    Height: 5\' 2"  (1.575 m)      Final diagnoses:  General weakness  Medication side effect     Final Clinical Impressions(s) / ED Diagnoses   Final diagnoses:  General weakness  Medication side effect    ED Discharge Orders    None       Blane Ohara, MD 01/30/17 1337    Blane Ohara, MD 01/30/17 [phone]

## 2017-01-30 NOTE — Discharge Instructions (Signed)
Return to the emergency department if you develop speech changes, loss of vision, unilateral weakness or numbness or new concerns. Stay well hydrated.  If you were given medicines take as directed.  If you are on coumadin or contraceptives realize their levels and effectiveness is altered by many different medicines.  If you have any reaction (rash, tongues swelling, other) to the medicines stop taking and see a physician.    If your blood pressure was elevated in the ER make sure you follow up for management with a primary doctor or return for chest pain, shortness of breath or stroke symptoms.  Please follow up as directed and return to the ER or see a physician for new or worsening symptoms.  Thank you. Vitals:   01/30/17 1139 01/30/17 1141 01/30/17 1143  BP:   120/69  Pulse:   75  Resp:  20 13  Temp:  98.4 F (36.9 C) 98.4 F (36.9 C)  TempSrc:  Oral Oral  SpO2:  99% 100%  Weight: 72.6 kg (160 lb)    Height: 5\' 2"  (1.575 m)

## 2017-01-30 NOTE — ED Notes (Signed)
MRI tech reports that due to pts claustrophobia . Dr Jodi MourningZavitz notified and will order CT

## 2017-01-30 NOTE — ED Notes (Signed)
Pt transported to CT ?

## 2017-01-30 NOTE — ED Notes (Signed)
1000 mlBolus of NS completed that was started by EMS

## 2017-03-12 ENCOUNTER — Telehealth: Payer: Self-pay | Admitting: *Deleted

## 2017-03-12 ENCOUNTER — Encounter: Payer: Self-pay | Admitting: Adult Health

## 2017-03-12 ENCOUNTER — Ambulatory Visit: Payer: Medicaid Other | Admitting: Adult Health

## 2017-03-12 NOTE — Telephone Encounter (Signed)
Patient was no show for follow up with NP today.  

## 2017-03-30 ENCOUNTER — Encounter: Payer: Self-pay | Admitting: Family Medicine

## 2017-06-01 DIAGNOSIS — J309 Allergic rhinitis, unspecified: Secondary | ICD-10-CM | POA: Diagnosis not present

## 2017-06-01 DIAGNOSIS — J069 Acute upper respiratory infection, unspecified: Secondary | ICD-10-CM | POA: Diagnosis not present

## 2017-06-01 DIAGNOSIS — J209 Acute bronchitis, unspecified: Secondary | ICD-10-CM | POA: Diagnosis not present

## 2017-06-01 DIAGNOSIS — J029 Acute pharyngitis, unspecified: Secondary | ICD-10-CM | POA: Diagnosis not present

## 2017-09-11 ENCOUNTER — Encounter (HOSPITAL_COMMUNITY): Payer: Self-pay | Admitting: Emergency Medicine

## 2017-09-11 ENCOUNTER — Emergency Department (HOSPITAL_COMMUNITY)
Admission: EM | Admit: 2017-09-11 | Discharge: 2017-09-11 | Disposition: A | Discharge status: Home / Self Care | Payer: Medicaid Other | Attending: Emergency Medicine | Admitting: Emergency Medicine

## 2017-09-11 ENCOUNTER — Other Ambulatory Visit: Payer: Self-pay

## 2017-09-11 ENCOUNTER — Emergency Department (HOSPITAL_COMMUNITY): Payer: Medicaid Other

## 2017-09-11 DIAGNOSIS — R0789 Other chest pain: Secondary | ICD-10-CM | POA: Diagnosis not present

## 2017-09-11 DIAGNOSIS — R6884 Jaw pain: Secondary | ICD-10-CM | POA: Diagnosis not present

## 2017-09-11 DIAGNOSIS — Z7982 Long term (current) use of aspirin: Secondary | ICD-10-CM | POA: Insufficient documentation

## 2017-09-11 DIAGNOSIS — R079 Chest pain, unspecified: Secondary | ICD-10-CM | POA: Diagnosis not present

## 2017-09-11 LAB — CBC
HCT: 40 % (ref 36.0–46.0)
HEMOGLOBIN: 13.5 g/dL (ref 12.0–15.0)
MCH: 31.6 pg (ref 26.0–34.0)
MCHC: 33.8 g/dL (ref 30.0–36.0)
MCV: 93.7 fL (ref 78.0–100.0)
Platelets: 200 10*3/uL (ref 150–400)
RBC: 4.27 MIL/uL (ref 3.87–5.11)
RDW: 12.7 % (ref 11.5–15.5)
WBC: 5.1 10*3/uL (ref 4.0–10.5)

## 2017-09-11 LAB — BASIC METABOLIC PANEL
ANION GAP: 9 (ref 5–15)
BUN: 12 mg/dL (ref 6–20)
CO2: 24 mmol/L (ref 22–32)
Calcium: 9.3 mg/dL (ref 8.9–10.3)
Chloride: 103 mmol/L (ref 98–111)
Creatinine, Ser: 0.81 mg/dL (ref 0.44–1.00)
Glucose, Bld: 102 mg/dL — ABNORMAL HIGH (ref 70–99)
POTASSIUM: 3.2 mmol/L — AB (ref 3.5–5.1)
SODIUM: 136 mmol/L (ref 135–145)

## 2017-09-11 LAB — TROPONIN I

## 2017-09-11 LAB — HCG, QUANTITATIVE, PREGNANCY

## 2017-09-11 IMAGING — DX DG CHEST 2V
2 series · 2 of 2 positions shown · non-contrast
Comparison: None.

CLINICAL DATA: Chest pain extending into the left upper extremity.

EXAM:
CHEST - 2 VIEW

[chest pa]
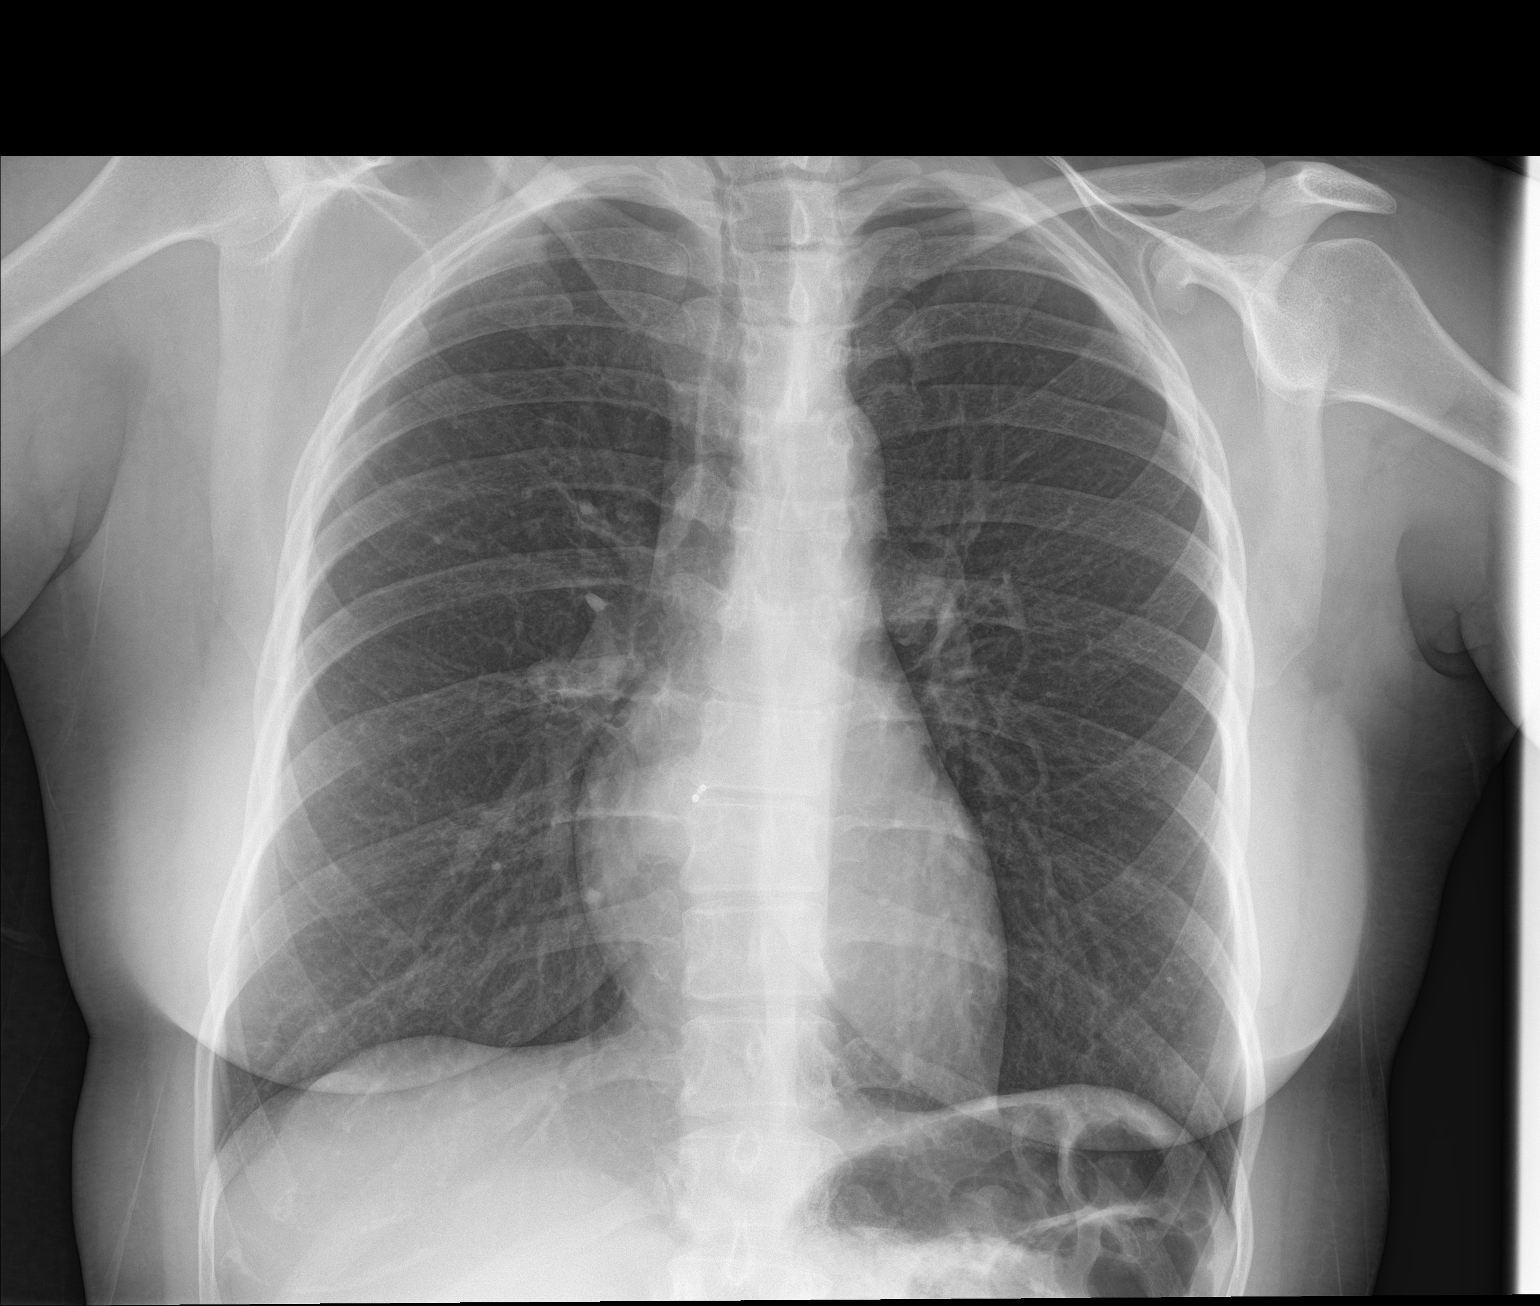

[chest lat]
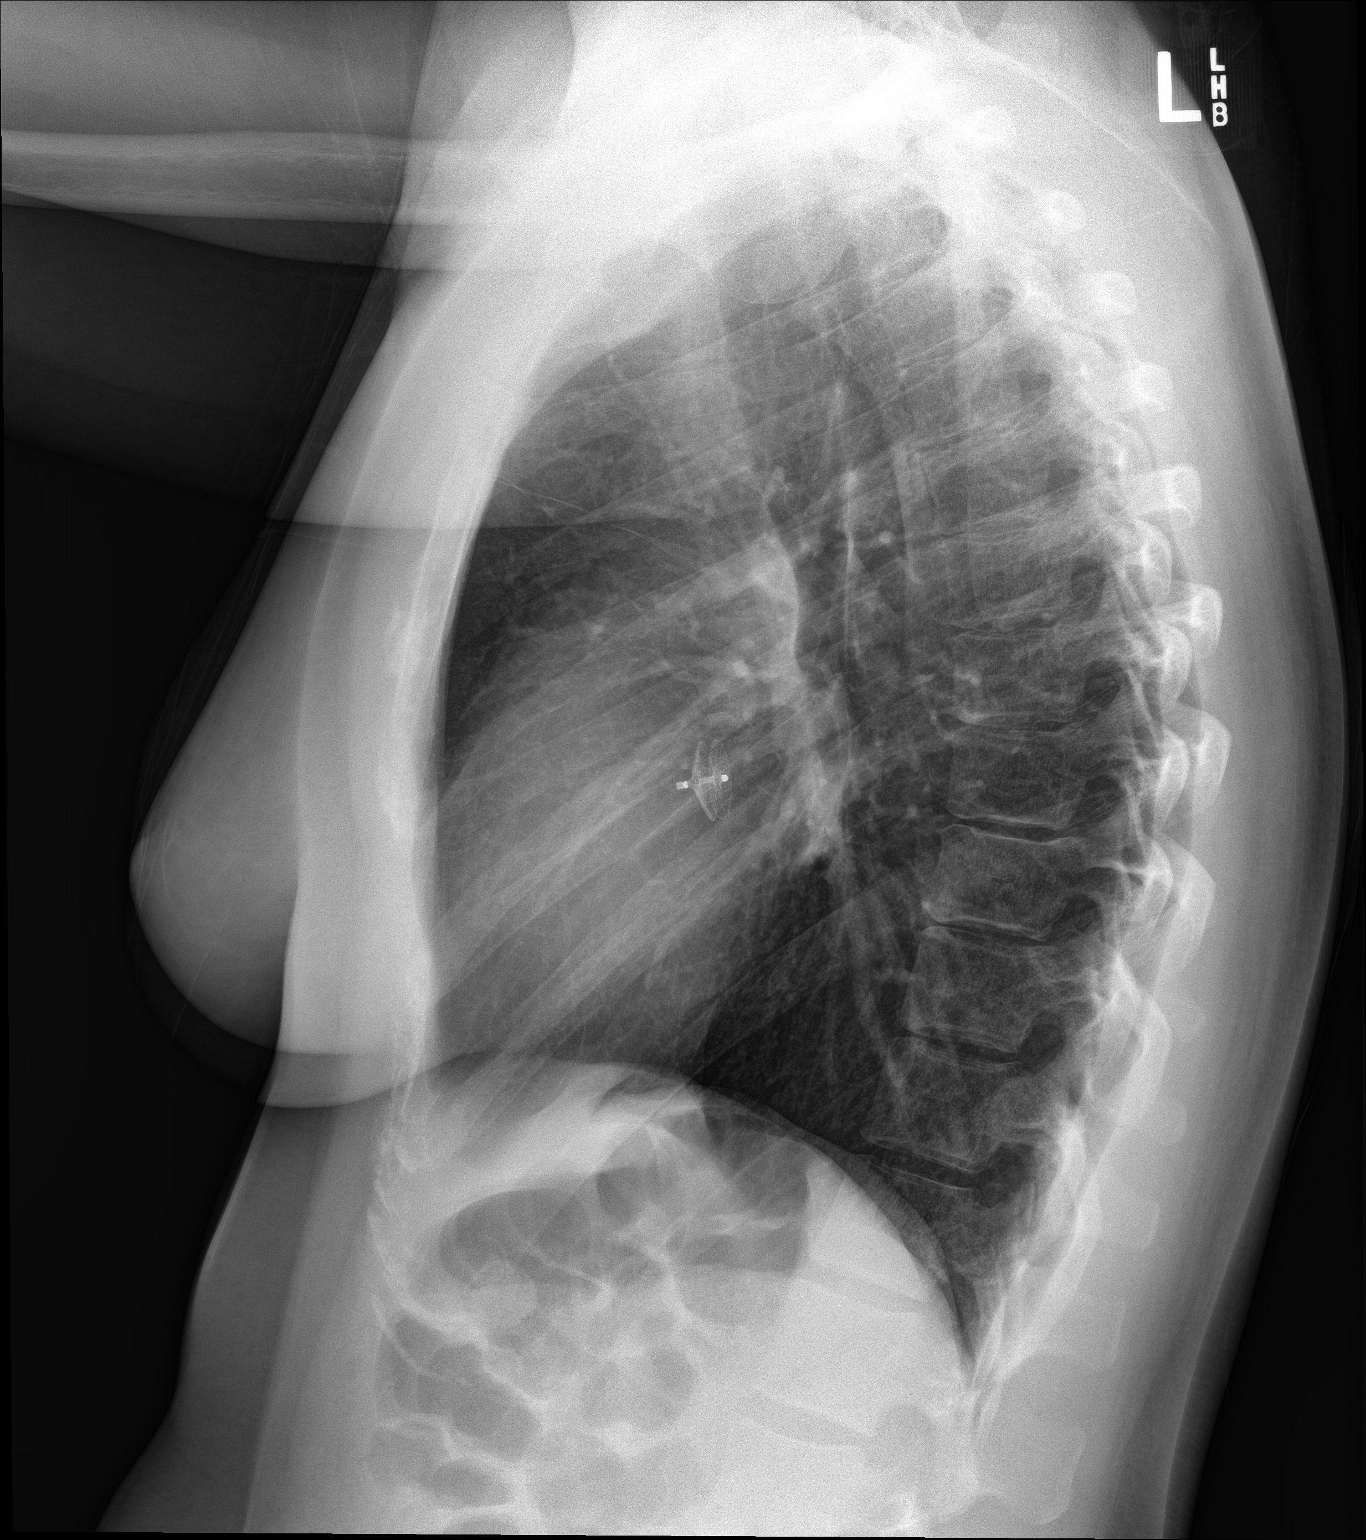

[2 of 2 positions shown; findings below may reference images not displayed]

FINDINGS: The heart size is normal. PFO closure device is stable in position.
There is no edema or effusion. No focal airspace disease present.
The visualized soft tissues and bony thorax are unremarkable.
IMPRESSION: Negative two view chest x-ray.

## 2017-09-11 MED ORDER — IBUPROFEN 600 MG PO TABS: 600.0000 mg | ORAL_TABLET | Freq: Four times a day (QID) | ORAL | 0 refills | Status: DC | PRN

## 2017-09-11 NOTE — ED Provider Notes (Signed)
[facility] EMERGENCY DEPARTMENT Provider Note   CSN: [id number] Arrival date & time: 09/11/17  1348     History   Chief Complaint Chief Complaint  Patient presents with  . Chest Pain    HPI Marissa [name] is a 37 y.o. female.  HPI  The patient is a 37 year old female, she has a history of anxiety, she also has a history of a prior stroke secondary to a patent foramen ovale and has had a repair procedure of that defect.  She presents today with a complaint of jaw pain and chest discomfort.  She reports that she was in her usual state of health last night when she started to have some right-sided jaw pain which seem to be in her cheek and her lower jaw.  She did not have pain with chewing and when she opened up her mouth and tried to squeeze her cheek and her jaw that made it feel better.  She was able to sleep and when she woke up this morning she noticed that she was having some throbbing sensations in her chest, she was feeling a little bit short of breath and having ongoing jaw discomfort.  She comes to the hospital with these complaints.  She denies fevers, coughing or swelling of the legs and states that she is very active, she does not exercise regularly but leads a very active lifestyle and never has any chest pain or shortness of breath with those activities.  She does not have the diagnosis of high blood pressure or diabetes or high cholesterol and does not smoke cigarettes or drink any alcohol.  She does not take any medications, she is supposed to take aspirin but does not take it unless she needs it based on how she is feeling.  She did take a baby aspirin prior to arrival because of this discomfort.  The family member that comes with the patient corroborates the patient's story and reports that she does look like she is anxious and feeling a little bit uncomfortable today but otherwise is her usual self.  Past Medical History:  Diagnosis Date  . Allergy    seasonal  .  Anxiety   . Common migraine with intractable migraine 11/08/2015  . Hyperlipidemia   . Stroke (HCC)   . Stroke (HCC)   . Vitamin D deficiency     Patient Active Problem List   Diagnosis Date Noted  . Common migraine with intractable migraine 11/08/2015  . PFO (patent foramen ovale) 10/08/2015  . Cerebrovascular accident (CVA) due to embolism (HCC) 10/08/2015  . HLD (hyperlipidemia) 10/08/2015  . Vitamin D deficiency 10/08/2015    Past Surgical History:  Procedure Laterality Date  . CESAREAN SECTION    . 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 TUBAL LIGATION       OB History    Gravida  1   Para      Term      Preterm      AB      Living        SAB      TAB      Ectopic      Multiple      Live Births               Home Medications    Prior to Admission medications   Medication Sig Start Date End Date Taking? Authorizing Provider  aspirin 81 MG chewable tablet Chew 81 mg by mouth once as needed.  Yes [provider]  ibuprofen (ADVIL,MOTRIN) 600 MG tablet Take 1 tablet (600 mg total) by mouth every 6 (six) hours as needed. 09/11/17   Eber HongMiller, Aleana Fifita, MD    Family History Family History  Problem Relation Age of Onset  . Hypertension Mother   . Heart disease Maternal Uncle        murmur  . Heart disease Maternal Grandmother   . COPD Maternal Grandfather   . Cancer Maternal Grandfather        lung  . Heart disease Maternal Aunt        murmur  . Migraines Neg Hx     Social History Social History   Tobacco Use  . Smoking status: Never Smoker  . Smokeless tobacco: Never Used  Substance Use Topics  . Alcohol use: No  . Drug use: No     Allergies   Triptans and Morphine and related   Review of Systems Review of Systems  All other systems reviewed and are negative.    Physical Exam Updated Vital Signs BP 105/78   Pulse 92   Temp 98.1 F (36.7 C) (Oral)   Resp 15   Ht 5\' 2"  (1.575 m)   Wt 77.1 kg   LMP  08/21/2017   SpO2 100%   BMI 31.09 kg/m   Physical Exam  Constitutional: She appears well-developed and well-nourished. No distress.  HENT:  Head: Normocephalic and atraumatic.  Mouth/Throat: Oropharynx is clear and moist. No oropharyngeal exudate.  Wisdom teeth present x4, there is erosion of these teeth but no periodontal abscesses, no trismus or torticollis, no swelling of the jaw, no tenderness over the cheek, no redness, no purulence, posterior pharynx is patent and phonation is normal  Eyes: Pupils are equal, round, and reactive to light. Conjunctivae and EOM are normal. Right eye exhibits no discharge. Left eye exhibits no discharge. No scleral icterus.  Neck: Normal range of motion. Neck supple. No JVD present. No thyromegaly present.  No JVD, very supple neck, no lymphadenopathy  Cardiovascular: Normal rate, regular rhythm, normal heart sounds and intact distal pulses. Exam reveals no gallop and no friction rub.  No murmur heard. No JVD, pulses are equal at the radial arteries bilaterally, there is no peripheral edema.  Pulmonary/Chest: Breath sounds normal. No respiratory distress. She has no wheezes. She has no rales.  Mild tachypnea  Abdominal: Soft. Bowel sounds are normal. She exhibits no distension and no mass. There is no tenderness.  Musculoskeletal: Normal range of motion. She exhibits no edema or tenderness.  No edema of the lower extremities, no asymmetry  Lymphadenopathy:    She has no cervical adenopathy.  Neurological: She is alert. Coordination normal.  The patient has 5 out of 5 strength in all 4 extremities, cranial nerves III through XII are normal, there is no facial droop.  Skin: Skin is warm and dry. No rash noted. No erythema.  Psychiatric: She has a normal mood and affect. Her behavior is normal.  Nursing note and vitals reviewed.    ED Treatments / Results  Labs (all labs ordered are listed, but only abnormal results are displayed) Labs Reviewed    BASIC METABOLIC PANEL - Abnormal; Notable for the following components:      Result Value   Potassium 3.2 (*)    Glucose, Bld 102 (*)    All other components within normal limits  CBC  TROPONIN I  HCG, QUANTITATIVE, PREGNANCY    EKG  ED ECG REPORT  I personally  interpreted this EKG   Date: 09/11/2017   Rate: 92  Rhythm: normal sinus rhythm  QRS Axis: normal  Intervals: normal  ST/T Wave abnormalities: normal  Conduction Disutrbances:none  Narrative Interpretation:   Old EKG Reviewed: none available   Radiology Dg Chest 2 View  Result Date: 09/11/2017 CLINICAL DATA:  Chest pain extending into the left upper extremity. EXAM: CHEST - 2 VIEW COMPARISON:  None. FINDINGS: The heart size is normal. PFO closure device is stable in position. There is no edema or effusion. No focal airspace disease present. The visualized soft tissues and bony thorax are unremarkable. IMPRESSION: Negative two view chest x-ray. Electronically Signed   By: Marin Roberts M.D.   On: 09/11/2017 14:25    Procedures Procedures (including critical care time)  Medications Ordered in ED Medications - No data to display   Initial Impression / Assessment and Plan / ED Course  I have reviewed the triage vital signs and the nursing notes.  Pertinent labs & imaging results that were available during my care of the patient were reviewed by me and considered in my medical decision making (see chart for details).     The patient's exam is unremarkable other than looking anxious and mildly tachypneic.  She has dentition which needs repair and I have referred her to a dentist, she states that she has one.  She has chest discomfort and states that she does have some reproducible tenderness when I push on her upper chest, she has no tenderness over her jaw, she initially had a pulse of 90, when I came in the room she jumped up to 130 and over 5 or 10 minutes it resolved and went back down to 85.  She appears  like she does have some underlying anxiety, she does not have any risk factors for pulmonary embolism at all and has no risk factors for cardiac disease at all.  Her EKG is totally normal, I have reviewed her chest x-ray with her showed her the images and discussed the findings and there does not appear to be any pneumothorax, pneumomediastinum, abnormally shaped heart, free air under the diaphragms or infiltrates.  At this time the nurses have Artie ordered labs including a troponin, I anticipate that the patient will be discharged home safely if that test is normal to follow-up in the outpatient setting.  The patient is agreeable to this plan.  Labs are normal except for the potassium of 3.2, the patient was informed and asked to take some extra potassium.  CBC normal, troponin normal, the patient at this time is stable for discharge as it is extremely unlikely that her symptoms are from a pathologic source.  She is symptom-free with a heart rate of 77 at this time.  She expressed her understanding to the indications for return.  Final Clinical Impressions(s) / ED Diagnoses   Final diagnoses:  Nonexertional chest pain  Jaw pain    ED Discharge Orders         Ordered    ibuprofen (ADVIL,MOTRIN) 600 MG tablet  Every 6 hours PRN     09/11/17 1548           Eber Hong, MD 09/11/17 1550

## 2017-09-11 NOTE — Discharge Instructions (Signed)
Your testing today did not show any specific findings to explain your symptoms We tested your heart, we tested your blood, we tested your lungs on an x-ray and they all came back reassuring Your potassium level was just slightly low, you would benefit from taking extra potassium in the form of bananas or potatoes or cooked black beans which will raise your potassium level You should follow-up with your family doctor within the next 48 hours for recheck if you are still having symptoms Return to the emergency department for increasing pain, difficulty breathing or any severe or worsening symptoms.

## 2017-09-11 NOTE — ED Triage Notes (Signed)
Patient complaining of right sided jaw pain last night with chest pain starting this morning.

## 2018-03-05 ENCOUNTER — Other Ambulatory Visit: Payer: Self-pay

## 2018-03-05 ENCOUNTER — Encounter (HOSPITAL_COMMUNITY): Payer: Self-pay | Admitting: Emergency Medicine

## 2018-03-05 ENCOUNTER — Emergency Department (HOSPITAL_COMMUNITY): Payer: Self-pay

## 2018-03-05 ENCOUNTER — Emergency Department (HOSPITAL_COMMUNITY)
Admission: EM | Admit: 2018-03-05 | Discharge: 2018-03-05 | Disposition: A | Discharge status: Home / Self Care | Payer: Self-pay | Attending: Emergency Medicine | Admitting: Emergency Medicine

## 2018-03-05 DIAGNOSIS — Z79899 Other long term (current) drug therapy: Secondary | ICD-10-CM | POA: Insufficient documentation

## 2018-03-05 DIAGNOSIS — Z7982 Long term (current) use of aspirin: Secondary | ICD-10-CM | POA: Insufficient documentation

## 2018-03-05 DIAGNOSIS — B9789 Other viral agents as the cause of diseases classified elsewhere: Secondary | ICD-10-CM | POA: Insufficient documentation

## 2018-03-05 DIAGNOSIS — J040 Acute laryngitis: Secondary | ICD-10-CM | POA: Insufficient documentation

## 2018-03-05 DIAGNOSIS — J069 Acute upper respiratory infection, unspecified: Secondary | ICD-10-CM | POA: Insufficient documentation

## 2018-03-05 IMAGING — DX DG CHEST 2V
2 series · 2 of 2 positions shown · non-contrast
Comparison: None.

CLINICAL DATA: Cough and fever beginning yesterday. Prior patent
foramen ovale.

EXAM:
CHEST - 2 VIEW

[chest pa]
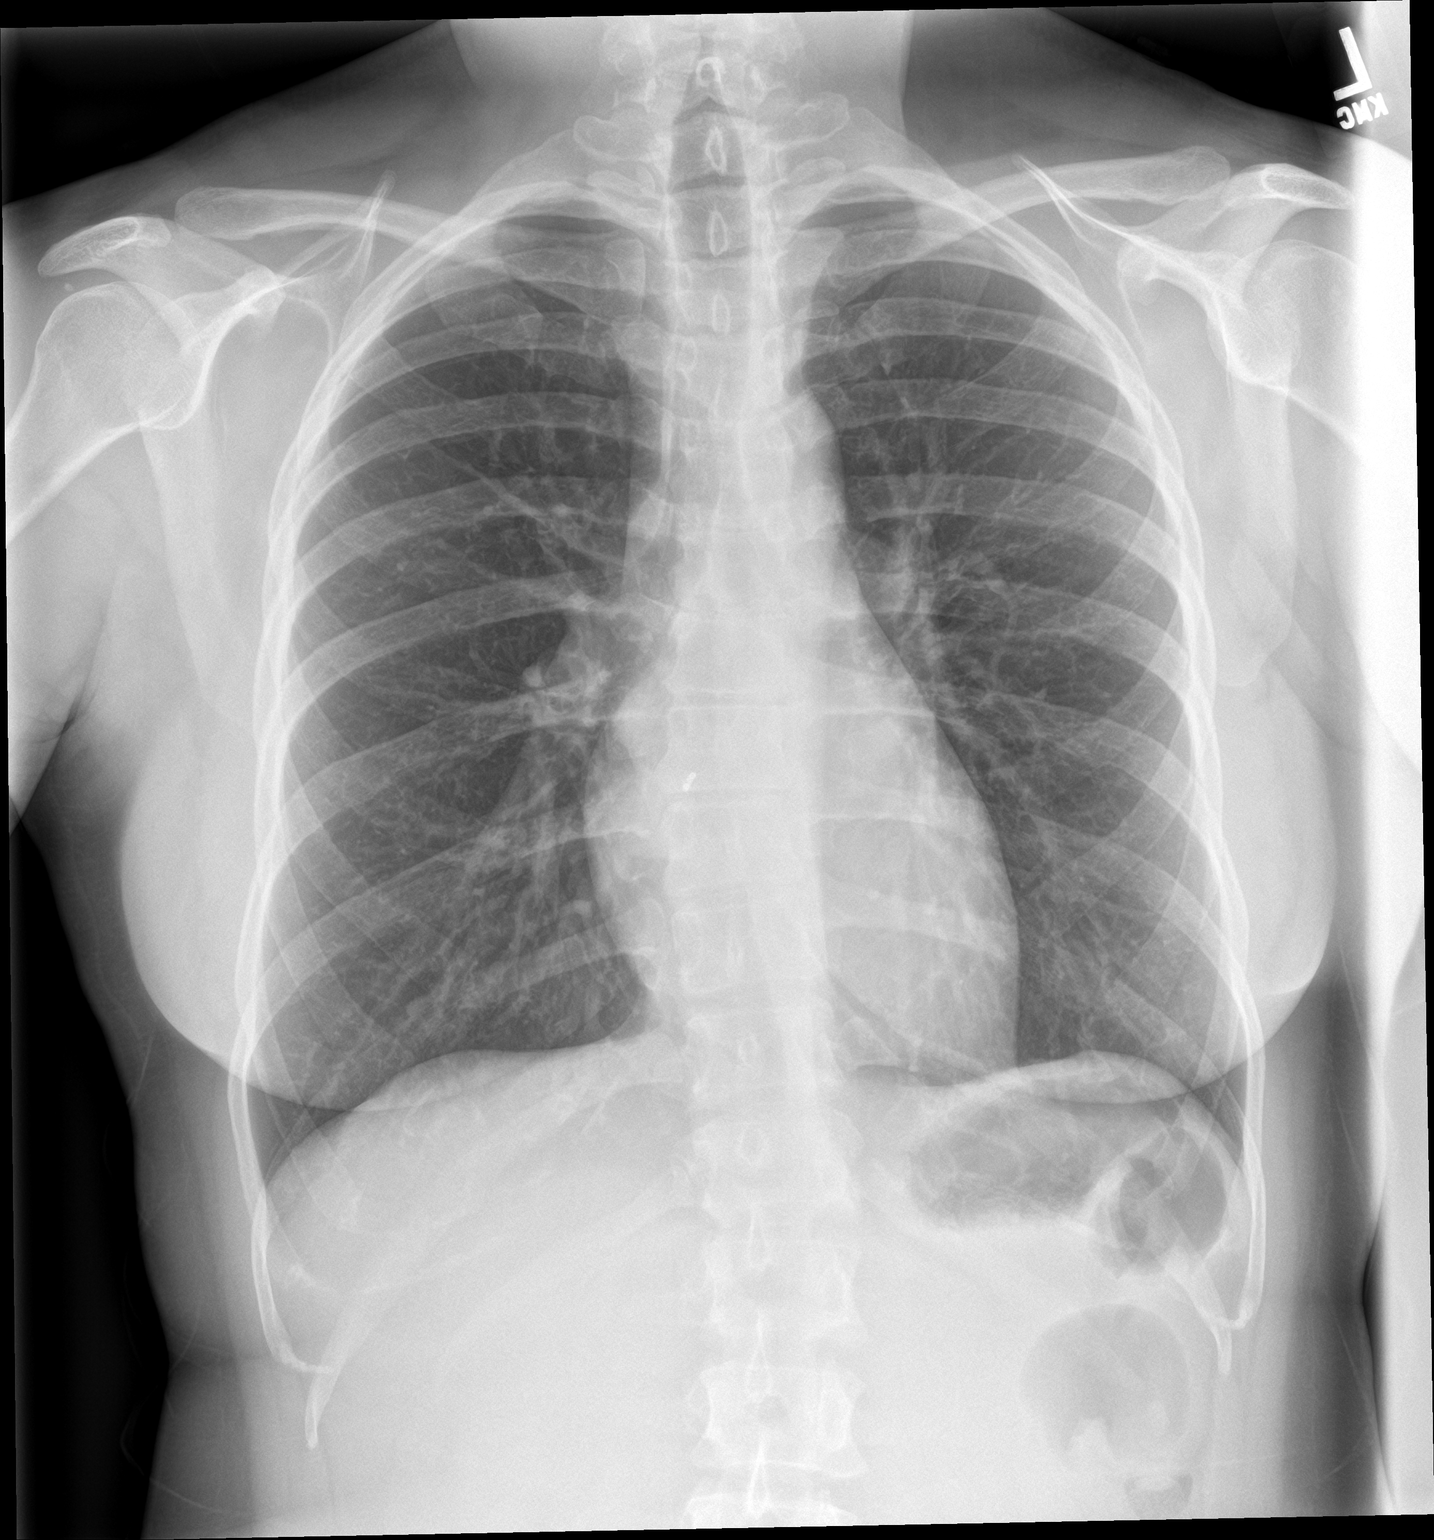

[chest lat]
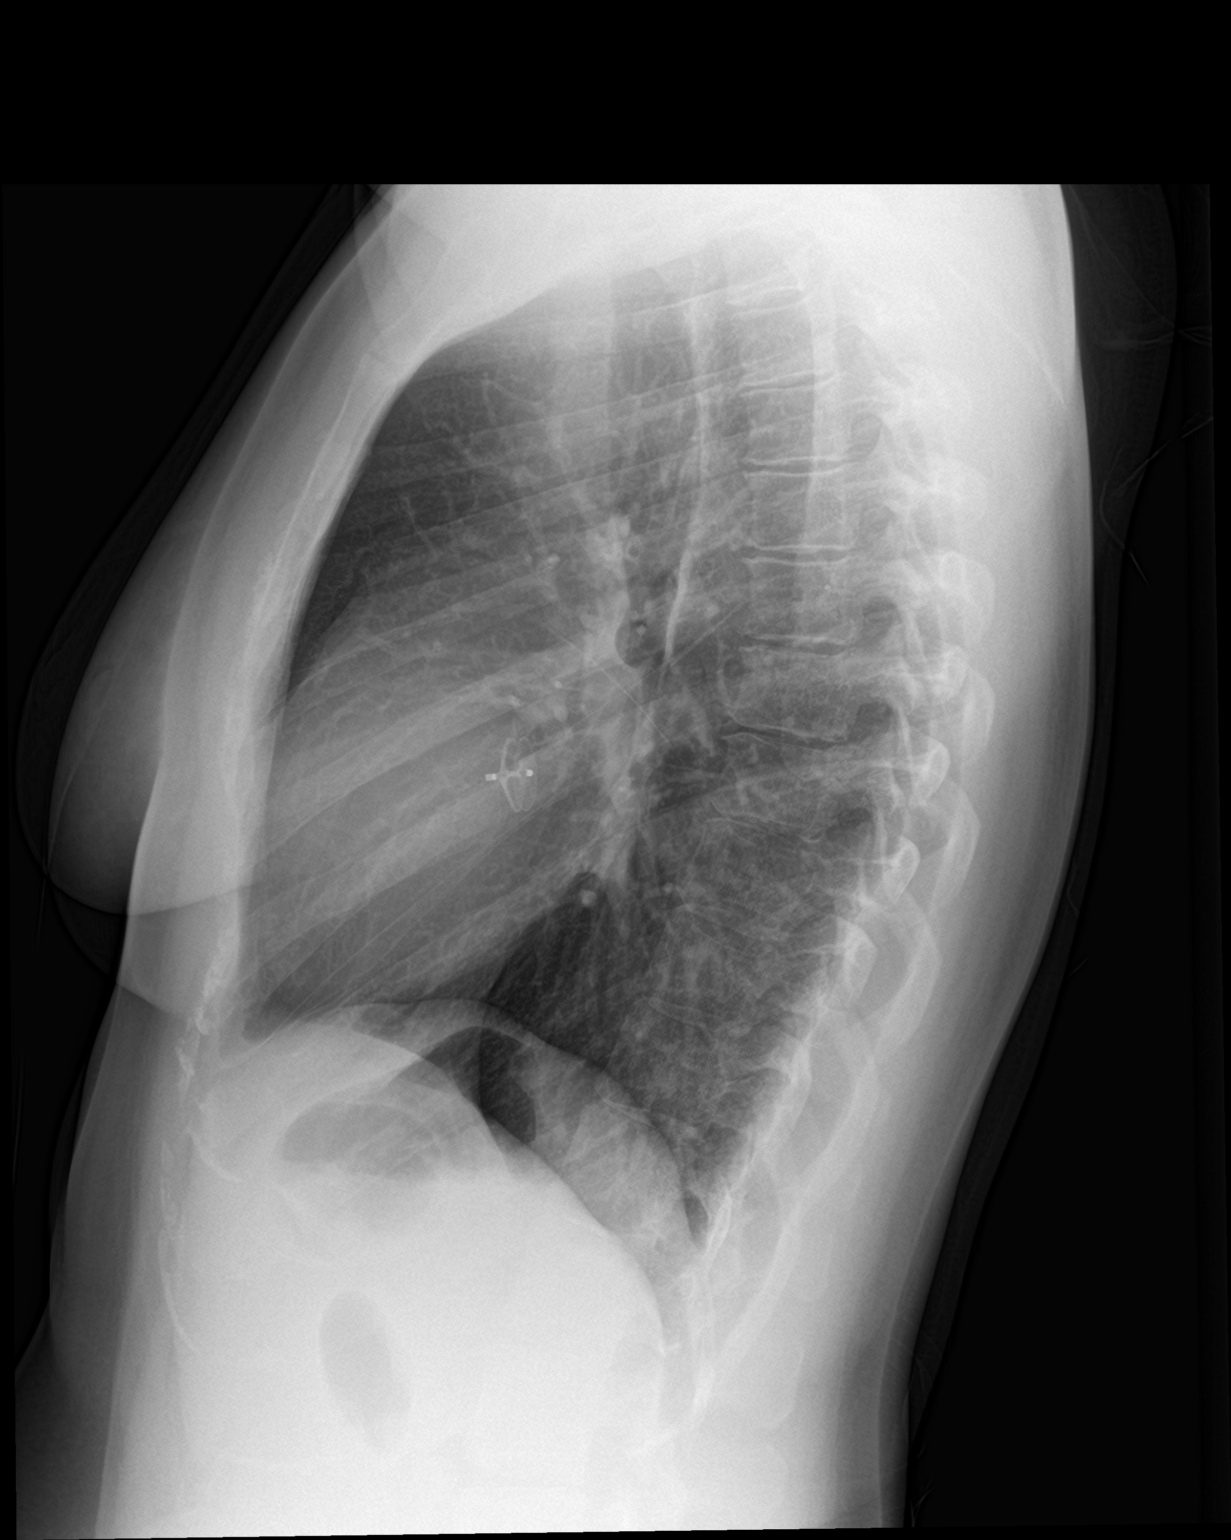

[2 of 2 positions shown; findings below may reference images not displayed]

FINDINGS: The heart size and mediastinal contours are within normal limits.
Umbrella device is again seen in the region of the atrial septum.
Both lungs are clear. The visualized skeletal structures are
unremarkable.
IMPRESSION: Stable exam.  No active cardiopulmonary disease.

## 2018-03-05 MED ORDER — PREDNISONE 20 MG PO TABS
40.0000 mg | ORAL_TABLET | Freq: Every day | ORAL | 0 refills | Status: DC
Start: 2018-03-05 — End: 2018-12-08

## 2018-03-05 MED ORDER — PROMETHAZINE-DM 6.25-15 MG/5ML PO SYRP: 5.0000 mL | ORAL_SOLUTION | Freq: Four times a day (QID) | ORAL | 0 refills | Status: DC | PRN

## 2018-03-05 NOTE — ED Notes (Signed)
To rad 

## 2018-03-05 NOTE — ED Notes (Signed)
From rad 

## 2018-03-05 NOTE — Discharge Instructions (Addendum)
Drink plenty of fluids.  Alternate Tylenol and ibuprofen every 4 and 6 hours if needed for body aches and/or fever.  Follow-up with your primary doctor for recheck or return to the ER for any worsening symptoms.

## 2018-03-05 NOTE — ED Triage Notes (Signed)
Coughing and congestion since last night, states her temp was 100 last night

## 2018-03-05 NOTE — ED Notes (Signed)
Here  For evaluation of a cough and fever since last night

## 2018-03-06 NOTE — ED Provider Notes (Signed)
[facility] EMERGENCY DEPARTMENT Provider Note   CSN: [id number] Arrival date & time: 03/05/18  4037     History   Chief Complaint Chief Complaint  Patient presents with  . Cough    HPI Marissa [name] is a 38 y.o. female.  HPI   Marissa [name] is a 38 y.o. female who presents to the Emergency Department complaining of cough and nasal congestion.  Symptoms began on the evening prior to arrival.  She had a low grade fever at onset, but has since resolved.  She describes the cough as non-productive and forceful at times. she also reports a loss of her voice since having excessive coughing.  No sore throat.   Nothing makes symptoms better or worse.  She denies chest pain, shortness of breath, body aches, and sore throat.   Past Medical History:  Diagnosis Date  . Allergy    seasonal  . Anxiety   . Common migraine with intractable migraine 11/08/2015  . Hyperlipidemia   . Stroke (HCC)   . Stroke (HCC)   . Vitamin D deficiency     Patient Active Problem List   Diagnosis Date Noted  . Common migraine with intractable migraine 11/08/2015  . PFO (patent foramen ovale) 10/08/2015  . Cerebrovascular accident (CVA) due to embolism (HCC) 10/08/2015  . HLD (hyperlipidemia) 10/08/2015  . Vitamin D deficiency 10/08/2015    Past Surgical History:  Procedure Laterality Date  . CESAREAN SECTION    . 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 TUBAL LIGATION       OB History    Gravida  1   Para      Term      Preterm      AB      Living        SAB      TAB      Ectopic      Multiple      Live Births               Home Medications    Prior to Admission medications   Medication Sig Start Date End Date Taking? Authorizing Provider  aspirin 81 MG chewable tablet Chew 81 mg by mouth once as needed.    [provider]  ibuprofen (ADVIL,MOTRIN) 600 MG tablet Take 1 tablet (600 mg total) by mouth every 6 (six) hours as needed. 09/11/17    Eber Hong, MD  predniSONE (DELTASONE) 20 MG tablet Take 2 tablets (40 mg total) by mouth daily. 03/05/18   Maryna Yeagle, PA-C  promethazine-dextromethorphan (PROMETHAZINE-DM) 6.25-15 MG/5ML syrup Take 5 mLs by mouth 4 (four) times daily as needed. 03/05/18   Pauline Aus, PA-C    Family History Family History  Problem Relation Age of Onset  . Hypertension Mother   . Heart disease Maternal Uncle        murmur  . Heart disease Maternal Grandmother   . COPD Maternal Grandfather   . Cancer Maternal Grandfather        lung  . Heart disease Maternal Aunt        murmur  . Migraines Neg Hx     Social History Social History   Tobacco Use  . Smoking status: Never Smoker  . Smokeless tobacco: Never Used  Substance Use Topics  . Alcohol use: No  . Drug use: No     Allergies   Triptans and Morphine and related   Review of Systems Review of Systems  Constitutional:  Positive for appetite change, chills and fever. Negative for activity change.  HENT: Positive for congestion and voice change. Negative for facial swelling, rhinorrhea, sore throat and trouble swallowing.   Eyes: Negative for visual disturbance.  Respiratory: Positive for cough. Negative for shortness of breath, wheezing and stridor.   Cardiovascular: Negative for chest pain and leg swelling.  Gastrointestinal: Negative for abdominal pain, diarrhea, nausea and vomiting.  Genitourinary: Negative for decreased urine volume and dysuria.  Musculoskeletal: Positive for myalgias. Negative for neck pain and neck stiffness.  Skin: Negative for rash.  Neurological: Negative for dizziness, weakness, numbness and headaches.  Hematological: Negative for adenopathy.  Psychiatric/Behavioral: Negative for confusion.     Physical Exam Updated Vital Signs BP 106/76 (BP Location: Right Arm)   Pulse 66   Temp 98.7 F (37.1 C) (Oral)   Resp 16   Ht 5\' 2"  (1.575 m)   Wt 72.6 kg   LMP 02/26/2018   SpO2 100%   BMI 29.26  kg/m   Physical Exam Vitals signs and nursing note reviewed.  Constitutional:      General: She is not in acute distress.    Appearance: She is well-developed. She is not ill-appearing.  HENT:     Head: Normocephalic and atraumatic.     Jaw: No trismus.     Right Ear: Tympanic membrane and ear canal normal.     Left Ear: Tympanic membrane and ear canal normal.     Nose: Mucosal edema and rhinorrhea present.     Mouth/Throat:     Mouth: Mucous membranes are moist.     Pharynx: Oropharynx is clear. Uvula midline. No oropharyngeal exudate, posterior oropharyngeal erythema or uvula swelling.     Tonsils: No tonsillar abscesses.     Comments: Uvula is midline and non-edematous, no exudates Eyes:     Conjunctiva/sclera: Conjunctivae normal.  Neck:     Musculoskeletal: Normal range of motion. No neck rigidity.     Trachea: Phonation normal.     Meningeal: Kernig's sign absent.  Cardiovascular:     Rate and Rhythm: Normal rate and regular rhythm.  Pulmonary:     Effort: Pulmonary effort is normal. No respiratory distress.     Breath sounds: Normal breath sounds. No wheezing or rales.     Comments: Pt actively coughing during exam, but lung sounds are clear to ascultation bilaterally Abdominal:     General: There is no distension.     Palpations: Abdomen is soft.     Tenderness: There is no abdominal tenderness. There is no guarding or rebound.  Musculoskeletal: Normal range of motion.  Lymphadenopathy:     Cervical: No cervical adenopathy.  Skin:    General: Skin is warm.     Findings: No rash.  Neurological:     General: No focal deficit present.     Mental Status: She is alert.     Sensory: No sensory deficit.     Motor: No weakness or abnormal muscle tone.      ED Treatments / Results  Labs (all labs ordered are listed, but only abnormal results are displayed) Labs Reviewed - No data to display  EKG None  Radiology Dg Chest 2 View  Result Date:  03/05/2018 CLINICAL DATA:  Cough and fever beginning yesterday. Prior patent foramen ovale. EXAM: CHEST - 2 VIEW COMPARISON:  None. FINDINGS: The heart size and mediastinal contours are within normal limits. Umbrella device is again seen in the region of the atrial septum. Both lungs are  clear. The visualized skeletal structures are unremarkable. IMPRESSION: Stable exam.  No active cardiopulmonary disease. Electronically Signed   By: Myles Rosenthal M.D.   On: 03/05/2018 11:30    Procedures Procedures (including critical care time)  Medications Ordered in ED Medications - No data to display   Initial Impression / Assessment and Plan / ED Course  I have reviewed the triage vital signs and the nursing notes.  Pertinent labs & imaging results that were available during my care of the patient were reviewed by me and considered in my medical decision making (see chart for details).     Pt is well appearing, non-toxic.  Vitals are reassuring.  Sx's are likely viral, but less likely to be flu.   She agrees to tx plan with steroid burst and anti-tussive.   Return precautions given.   Final Clinical Impressions(s) / ED Diagnoses   Final diagnoses:  Viral URI with cough  Laryngitis    ED Discharge Orders         Ordered    predniSONE (DELTASONE) 20 MG tablet  Daily     03/05/18 1140    promethazine-dextromethorphan (PROMETHAZINE-DM) 6.25-15 MG/5ML syrup  4 times daily PRN     03/05/18 1140           Audray Rumore, [place], PA-C 03/06/18 2050    Blane Ohara, MD 03/08/18 2330

## 2018-10-29 ENCOUNTER — Other Ambulatory Visit: Payer: Self-pay

## 2018-10-29 ENCOUNTER — Emergency Department (HOSPITAL_COMMUNITY)
Admission: EM | Admit: 2018-10-29 | Discharge: 2018-10-29 | Disposition: A | Discharge status: Home / Self Care | Payer: Medicaid Other | Attending: Emergency Medicine | Admitting: Emergency Medicine

## 2018-10-29 ENCOUNTER — Encounter (HOSPITAL_COMMUNITY): Payer: Self-pay

## 2018-10-29 DIAGNOSIS — Z7982 Long term (current) use of aspirin: Secondary | ICD-10-CM | POA: Insufficient documentation

## 2018-10-29 DIAGNOSIS — K0889 Other specified disorders of teeth and supporting structures: Secondary | ICD-10-CM | POA: Insufficient documentation

## 2018-10-29 MED ORDER — HYDROCODONE-ACETAMINOPHEN 5-325 MG PO TABS: ORAL_TABLET | ORAL | 0 refills | Status: DC

## 2018-10-29 MED ORDER — PENICILLIN V POTASSIUM 500 MG PO TABS: 500.0000 mg | ORAL_TABLET | Freq: Three times a day (TID) | ORAL | 0 refills | Status: DC

## 2018-10-29 NOTE — ED Triage Notes (Signed)
Pt presents to ED with complaints of left lower dental pain x 3 days. Pt denies fever. Has appointment with dentist next week.

## 2018-10-29 NOTE — Discharge Instructions (Addendum)
Continue taking ibuprofen 600 mg every 8 hours with food.  Take the antibiotic as directed until its finished.  Be sure to keep your appointment with your dentist for next week.

## 2018-10-29 NOTE — ED Provider Notes (Signed)
[facility] EMERGENCY DEPARTMENT Provider Note   CSN: [id number] Arrival date & time: 10/29/18  1449     History   Chief Complaint Chief Complaint  Patient presents with  . Dental Pain    HPI Marissa [name] is a 38 y.o. female.     HPI   Marissa [name] is a 38 y.o. female who presents to the Emergency Department complaining of left lower dental pain.  She describes sharp, stabbing pain to her left tooth for 3 days.  Pain is worse with chewing and exposure to hot or cold foods or liquids.  She denies facial swelling, fever, chills, neck pain, difficulty swallowing or breathing.     Past Medical History:  Diagnosis Date  . Allergy    seasonal  . Anxiety   . Common migraine with intractable migraine 11/08/2015  . Hyperlipidemia   . Stroke (HCC)   . Stroke (HCC)   . Vitamin D deficiency     Patient Active Problem List   Diagnosis Date Noted  . Common migraine with intractable migraine 11/08/2015  . PFO (patent foramen ovale) 10/08/2015  . Cerebrovascular accident (CVA) due to embolism (HCC) 10/08/2015  . HLD (hyperlipidemia) 10/08/2015  . Vitamin D deficiency 10/08/2015    Past Surgical History:  Procedure Laterality Date  . CESAREAN SECTION    . 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 TUBAL LIGATION       OB History    Gravida  1   Para      Term      Preterm      AB      Living        SAB      TAB      Ectopic      Multiple      Live Births               Home Medications    Prior to Admission medications   Medication Sig Start Date End Date Taking? Authorizing Provider  aspirin 81 MG chewable tablet Chew 81 mg by mouth once as needed.    [provider]  HYDROcodone-acetaminophen (NORCO/VICODIN) 5-325 MG tablet Take one tab po q 4 hrs prn pain 10/29/18   Shaune Malacara, PA-C  ibuprofen (ADVIL,MOTRIN) 600 MG tablet Take 1 tablet (600 mg total) by mouth every 6 (six) hours as needed. 09/11/17   Eber Hong, MD   penicillin v potassium (VEETID) 500 MG tablet Take 1 tablet (500 mg total) by mouth 3 (three) times daily. 10/29/18   Mellissa Conley, PA-C  predniSONE (DELTASONE) 20 MG tablet Take 2 tablets (40 mg total) by mouth daily. 03/05/18   Bastien Strawser, PA-C  promethazine-dextromethorphan (PROMETHAZINE-DM) 6.25-15 MG/5ML syrup Take 5 mLs by mouth 4 (four) times daily as needed. 03/05/18   Pauline Aus, PA-C    Family History Family History  Problem Relation Age of Onset  . Hypertension Mother   . Heart disease Maternal Uncle        murmur  . Heart disease Maternal Grandmother   . COPD Maternal Grandfather   . Cancer Maternal Grandfather        lung  . Heart disease Maternal Aunt        murmur  . Migraines Neg Hx     Social History Social History   Tobacco Use  . Smoking status: Never Smoker  . Smokeless tobacco: Never Used  Substance Use Topics  . Alcohol use: No  . Drug  use: No     Allergies   Triptans and Morphine and related   Review of Systems Review of Systems  Constitutional: Negative for appetite change and fever.  HENT: Positive for dental problem. Negative for congestion, ear pain, facial swelling, sore throat and trouble swallowing.   Eyes: Negative for pain and visual disturbance.  Respiratory: Negative for cough.   Gastrointestinal: Negative for nausea and vomiting.  Musculoskeletal: Negative for neck pain and neck stiffness.  Neurological: Negative for dizziness, facial asymmetry and headaches.  Hematological: Negative for adenopathy.     Physical Exam Updated Vital Signs BP (!) 143/77 (BP Location: Right Arm)   Pulse 77   Temp 98.6 F (37 C) (Oral)   Resp 18   Ht 5\' 2"  (1.575 m)   Wt 72.6 kg   LMP 10/22/2018   SpO2 100%   BMI 29.26 kg/m   Physical Exam Vitals signs and nursing note reviewed.  Constitutional:      General: She is not in acute distress.    Appearance: She is well-developed.  HENT:     Head: Normocephalic and atraumatic.      Jaw: No trismus.     Right Ear: Tympanic membrane and ear canal normal.     Left Ear: Tympanic membrane and ear canal normal.     Mouth/Throat:     Dentition: Dental caries present. No dental abscesses.     Pharynx: Uvula midline. No uvula swelling.     Comments: Tenderness to palpation of the left lower premolar and first molar.  Dental caries is present.  Mild erythema and edema of the surrounding gingiva without fluctuance.  No facial edema.  No trismus. Neck:     Musculoskeletal: Normal range of motion and neck supple.  Cardiovascular:     Rate and Rhythm: Normal rate and regular rhythm.     Heart sounds: Normal heart sounds. No murmur.  Pulmonary:     Effort: Pulmonary effort is normal.     Breath sounds: Normal breath sounds.  Musculoskeletal: Normal range of motion.  Lymphadenopathy:     Cervical: No cervical adenopathy.  Skin:    General: Skin is warm and dry.  Neurological:     Mental Status: She is alert and oriented to person, place, and time.     Motor: No abnormal muscle tone.     Coordination: Coordination normal.      ED Treatments / Results  Labs (all labs ordered are listed, but only abnormal results are displayed) Labs Reviewed - No data to display  EKG None  Radiology No results found.  Procedures Procedures (including critical care time)  Medications Ordered in ED Medications - No data to display   Initial Impression / Assessment and Plan / ED Course  I have reviewed the triage vital signs and the nursing notes.  Pertinent labs & imaging results that were available during my care of the patient were reviewed by me and considered in my medical decision making (see chart for details).        Patient with dental pain.  No concerning symptoms for Ludewig's angina.  Airway is patent.  He has an appointment with her dentist for next week.  Final Clinical Impressions(s) / ED Diagnoses   Final diagnoses:  Pain, dental    ED Discharge Orders          Ordered    penicillin v potassium (VEETID) 500 MG tablet  3 times daily     10/29/18 1631  HYDROcodone-acetaminophen (NORCO/VICODIN) 5-325 MG tablet     10/29/18 1631           Kem Parkinson, PA-C 10/29/18 2110    Dorie Rank, MD 10/31/18 1001

## 2018-12-08 ENCOUNTER — Encounter: Payer: Self-pay | Admitting: Family Medicine

## 2018-12-08 ENCOUNTER — Ambulatory Visit (INDEPENDENT_AMBULATORY_CARE_PROVIDER_SITE_OTHER): Payer: Medicaid Other | Admitting: Family Medicine

## 2018-12-08 ENCOUNTER — Other Ambulatory Visit: Payer: Self-pay

## 2018-12-08 ENCOUNTER — Encounter (INDEPENDENT_AMBULATORY_CARE_PROVIDER_SITE_OTHER): Payer: Self-pay

## 2018-12-08 VITALS — BP 112/70 | HR 86 | Temp 99.0°F | Resp 12 | Ht 62.0 in | Wt 179.0 lb

## 2018-12-08 DIAGNOSIS — E785 Hyperlipidemia, unspecified: Secondary | ICD-10-CM | POA: Diagnosis not present

## 2018-12-08 DIAGNOSIS — G43019 Migraine without aura, intractable, without status migrainosus: Secondary | ICD-10-CM | POA: Diagnosis not present

## 2018-12-08 DIAGNOSIS — Q211 Atrial septal defect: Secondary | ICD-10-CM

## 2018-12-08 DIAGNOSIS — F419 Anxiety disorder, unspecified: Secondary | ICD-10-CM | POA: Diagnosis not present

## 2018-12-08 DIAGNOSIS — E669 Obesity, unspecified: Secondary | ICD-10-CM | POA: Diagnosis not present

## 2018-12-08 DIAGNOSIS — Z8673 Personal history of transient ischemic attack (TIA), and cerebral infarction without residual deficits: Secondary | ICD-10-CM

## 2018-12-08 DIAGNOSIS — Q2112 Patent foramen ovale: Secondary | ICD-10-CM

## 2018-12-08 MED ORDER — BUSPIRONE HCL 5 MG PO TABS: 5.0000 mg | ORAL_TABLET | Freq: Three times a day (TID) | ORAL | 0 refills | Status: DC

## 2018-12-08 MED ORDER — ESCITALOPRAM OXALATE 10 MG PO TABS: 10.0000 mg | ORAL_TABLET | Freq: Every day | ORAL | 1 refills | Status: DC

## 2018-12-08 NOTE — Progress Notes (Signed)
Subjective:     Patient ID: Marissa [name], female   DOB: 02/08/1980, 38 y.o.   MRN: [id number]  [facility] presents for New Patient (Initial Visit) (establish care) Marissa [name] is a 38 year old patient who used to be seen by Dr. Meda Coffee.  Has not been seen by her since 2018. Presents today to get reestablished with care.  Secondary to several concerns that she has. She has a significant history for allergies-seasonal, anxiety, migraine, stroke history, patent foramen ovale repair, vitamin D deficiency.  She has a broken tooth that she has to have dental surgery for on Friday.  She reports that the antibiotic is making her stomach hurt.  She is also very nervous about being put to sleep.  She is nervous about being put to sleep secondary to having a stroke in the past.  And the condition with her heart where she had a patent foramen ovale (has been repaired 2017).  Has not followed up with cardiology since 2018.  Has not been seen by neurology since 2018.  She reports that she has some anxiety she does not sleep very well.  She has trouble falling asleep when she does fall asleep she wakes around 3-4 AM she is very anxious but she does not know if she is anxious about. She reports that she feels like everything in her life is good right now she does not know why she is having the issues that she is having.  Her most concern is that she started having these one-sided "shaking/tremoring" where she cannot communicate or not able to communicate during the event.  She reports it started right after her stroke 3 years ago.  She had a small frontal stroke contributing to the patent foramen ovale.  She reports that these episodes of shaking would come on and off without warning or cause and last about 15 to 20 minutes.  More recently she is started to have them for about an hour she reports and then she is very tired and needs to lay down.  She denies having any seizure activity.  She denies going  to the emergency room during these events.  She reports that until this last one is Sunday, November 15 she was not really as concerned about them thought that they were left over side effect of the stroke. Her family was very concerned about the situation and so she agreed to come into get established with care.  She has a significant history of headaches as well.  Has tried Topamax in the past.  And a few other medications but she does not recall the names.  She reports that sometimes she will have a headache not long after having one of the events as described above.  Unsure if this is an aura or not.   Per note review her last follow-up with cardiology was in March 2018 with [facility]. At that time she was in no distress.  She was instructed to continue the 81 mg of aspirin daily.  Had self discontinued her Plavix.  Her preliminary echo report showed no intra-atrial shunting on the bubble study as read by Dr. Radford Pax.  The final report was still pending at that time.  She was to have a 43-month repeat do not see this in the chart.  She was counseled on consuming heart healthy diet and limiting salt making sure that she got exercise.  Per note review her last follow-up with neurology was February 2 of 2018 with  [facility].  Dr. Anne Hahn and Aundra Millet NP. She reported for follow-up at that time and noted that her headaches had decreased in frequency.  She was having headaches about every 2 weeks.  They are typically occurring in the right temporal region.  She said the ibuprofen was usually very beneficial to helping him.  The Topamax gave her hives so she no longer continue that.  Had not had any strokelike symptoms at that appointment.  She did note that she had a little bit of chest pain the Sunday prior to her appointment it hurt to touch her chest.  She reported that she took aspirin for that and it was beneficial.  She had stated that over the next day or 2 her chest pain had resolved.  She did not go  to the hospital or advised her PCP of the chest discomfort.  Neuro examination was intact.  She was counseled on maintaining a cholesterol less than 70, A1c less than 6.5%, and blood pressure 130/90 or lower.  She was encouraged to make sure she eats a well-rounded diet maintain heart health.  And reduce risk of future stroke.  Below is copy of history from that note based off of the events surrounding her stroke.  HISTORY (WILLIS): Marissa [name] is a 38 year old right-handed black female with a history of migraine headaches since she was a teenager. Her headaches are bifrontal in nature, and occur 2 or 3 times a week. The headaches may be associated with photophobia, and nausea without vomiting. The patient does not note any particular activating factors for her headache, but her headache does improve with sleep. She takes ibuprofen for the headache, she has never been on a daily prophylactic medication for the headache. Headache may interfere with her ability to work at times. She has never had any strokelike symptoms with her headache previously. Around 09/01/2015, she was seen at the [facility] emergency room with headache and left-sided numbness. A CT scan of the brain was done, this was unremarkable. The patient had ongoing symptoms over the next 2 days, and she went to [facility] on August 14, and from there was sent to [facility]. The patient was found to have a small right frontal stroke. MRA of the head was unremarkable, carotid Doppler studies were unremarkable, a 2-D echocardiogram showed an ejection fraction of 55-60% with some left atrial enlargement, grade 1 diastolic dysfunction. A TEEwas done and confirmed the presence of a PFO. A hypercoagulable state workup was negative, venous Doppler studies were unremarkable. No evidence of a DVT was noted. The patient was placed on low-dose aspirin, 81 mg. She was not on birth control pills. I do not see that a urine drug screen was checked. The  patient has had severe fatigue since the onset of the stroke, she apparently came back 2 weeks later and had closure of the PFO. The patient had some chest discomfort since that time. The patient indicates that she is sleeping fairly well. She continues to have her headaches. She denies any family history of headache. She has had some problemswith anxiety and panic disorder, she has been kept out of work until 12/02/2015.   Today patient denies signs and symptoms of COVID 19 infection including fever, chills, cough, shortness of breath, and headache.  Past Medical, Surgical, Social History, Allergies, and Medications have been Reviewed. Past Medical History:  Diagnosis Date   Allergy    seasonal   Anxiety    Common migraine with intractable migraine 11/08/2015  Hyperlipidemia    Stroke (HCC)    Stroke (HCC)    Vitamin D deficiency    Past Surgical History:  Procedure Laterality Date   CESAREAN SECTION     PATENT FORAMEN OVALE CLOSURE  09/25/2015   TUBAL LIGATION     Social History   Socioeconomic History   Marital status: Married    Spouse name: Christiane HaJonathan   Number of children: 3   Years of education: 12   Highest education level: Not on file  Occupational History   Occupation: [employer]    Comment: [employer]  Social Needs   Financial resource strain: Not on file   Food insecurity    Worry: Not on file    Inability: Not on file   Transportation needs    Medical: Not on file    Non-medical: Not on file  Tobacco Use   Smoking status: Never Smoker   Smokeless tobacco: Never Used  Substance and Sexual Activity   Alcohol use: No   Drug use: No   Sexual activity: Yes    Partners: Male    Birth control/protection: Surgical    Comment: Married  Lifestyle   Physical activity    Days per week: Not on file    Minutes per session: Not on file   Stress: Not on file  Relationships   Social connections    Talks on phone: Not on file    Gets  together: Not on file    Attends religious service: Not on file    Active member of club or organization: Not on file    Attends meetings of clubs or organizations: Not on file    Relationship status: Not on file   Intimate partner violence    Fear of current or ex partner: Not on file    Emotionally abused: Not on file    Physically abused: Not on file    Forced sexual activity: Not on file  Other Topics Concern   Not on file  Social History Narrative   Lives with husband and 3 children   Right-handed   Caffeine: rare    Outpatient Encounter Medications as of 12/08/2018  Medication Sig   aspirin 81 MG chewable tablet Chew 81 mg by mouth once as needed.   ibuprofen (ADVIL,MOTRIN) 600 MG tablet Take 1 tablet (600 mg total) by mouth every 6 (six) hours as needed.   [DISCONTINUED] HYDROcodone-acetaminophen (NORCO/VICODIN) 5-325 MG tablet Take one tab po q 4 hrs prn pain (Patient not taking: Reported on 12/08/2018)   [DISCONTINUED] penicillin v potassium (VEETID) 500 MG tablet Take 1 tablet (500 mg total) by mouth 3 (three) times daily. (Patient not taking: Reported on 12/08/2018)   [DISCONTINUED] predniSONE (DELTASONE) 20 MG tablet Take 2 tablets (40 mg total) by mouth daily. (Patient not taking: Reported on 12/08/2018)   [DISCONTINUED] promethazine-dextromethorphan (PROMETHAZINE-DM) 6.25-15 MG/5ML syrup Take 5 mLs by mouth 4 (four) times daily as needed. (Patient not taking: Reported on 12/08/2018)   No facility-administered encounter medications on file as of 12/08/2018.    Allergies  Allergen Reactions   Triptans     Cannot use triptans for migraine per neurology due to stroke history   Morphine And Related Hives    Feels like skin is burning     Review of Systems  Constitutional: Negative for chills and fever.  HENT: Positive for dental problem.   Eyes: Negative.   Respiratory: Negative.  Negative for cough and shortness of breath.   Cardiovascular: Negative.  Gastrointestinal: Negative.   Endocrine: Negative.   Genitourinary: Negative.   Musculoskeletal: Negative.   Skin: Negative.   Allergic/Immunologic: Negative.   Neurological: Positive for tremors and headaches.  Hematological: Negative.   Psychiatric/Behavioral: Positive for sleep disturbance. The patient is nervous/anxious.   All other systems reviewed and are negative.      Objective:     BP 112/70    Pulse 86    Temp 99 F (37.2 C) (Oral)    Resp 12    Ht  (1.575 m)    Wt 179 lb 0.6 oz (81.2 kg)    SpO2 96%    BMI 32.75 kg/m   Physical Exam Vitals signs and nursing note reviewed.  Constitutional:      Appearance: Normal appearance. She is well-developed, well-groomed and overweight.  HENT:     Head: Normocephalic and atraumatic.     Right Ear: External ear normal.     Left Ear: External ear normal.     Nose: Nose normal.     Mouth/Throat:     Mouth: Mucous membranes are moist.     Pharynx: Oropharynx is clear.  Eyes:     General:        Right eye: No discharge.        Left eye: No discharge.     Conjunctiva/sclera: Conjunctivae normal.  Neck:     Musculoskeletal: Normal range of motion and neck supple.  Cardiovascular:     Rate and Rhythm: Normal rate and regular rhythm.     Pulses: Normal pulses.     Heart sounds: Normal heart sounds.  Pulmonary:     Effort: Pulmonary effort is normal.     Breath sounds: Normal breath sounds.  Musculoskeletal: Normal range of motion.  Skin:    General: Skin is warm.  Neurological:     General: No focal deficit present.     Mental Status: She is alert and oriented to person, place, and time.  Psychiatric:        Attention and Perception: Attention and perception normal.        Mood and Affect: Mood is anxious. Affect is tearful.        Speech: Speech normal.        Behavior: Behavior normal. Behavior is cooperative.        Thought Content: Thought content normal.        Cognition and Memory: Cognition and memory  normal.        Judgment: Judgment normal.        Assessment and Plan       1. Anxiety Currently uncontrolled anxiety at this time.  Possibly from history of stroke concerned about what is going on headache or seizures or another stroke or something else. We will start Lexapro BuSpar to see if we can start to flush out what might be happening in helping her deal with some of the emotions.  As well as if possible cause of her questionable shaking/tremors is related to anxiety.  Checking thyroid level as well.  - escitalopram (LEXAPRO) 10 MG tablet; Take 1 tablet (10 mg total) by mouth daily.  Dispense: 30 tablet; Refill: 1 - busPIRone (BUSPAR) 5 MG tablet; Take 1 tablet (5 mg total) by mouth 3 (three) times daily.  Dispense: 90 tablet; Refill: 0 - TSH  2. PFO (patent foramen ovale) Surgery to repair 2017.  occluder device implanted by Dr. Mayford Knife.   3. History of CVA (cerebrovascular accident) Has not followed up  with neurology in about a year.  Last note in chart is from February 2018. Was seen at [facility] neurology would like to be referred to neurologist here in town for easier access.   - Ambulatory referral to Neurology  4. Common migraine with intractable migraine Does have headache still ongoing.  Not currently taking anything on a daily basis for them.  Already is causing the tremor-like symptoms that she is experiencing unsure.  Could be atypical type migraines.  Additionally could be seizure-like activity.  Collaboration is greatly appreciated.  - Ambulatory referral to Neurology  5. Hyperlipidemia, unspecified hyperlipidemia type Checking labs strongly encouraged to make sure she keeps her A1c and cholesterol in control secondary to previous stroke and ongoing concerns.  Encouraged to keep a well-balanced diet.  Weight loss is recommended.  Along with exercise.  - Hemoglobin A1c - Lipid panel  6. Obesity (BMI 30.0-34.9) Deteriorated  Donnamarie YER CASTELLO is educated  about the importance of exercise daily to help with weight management. A minumum of 30 minutes daily is recommended. Additionally, importance of healthy food choices  with portion control discussed.   Wt Readings from Last 3 Encounters:  12/08/18 179 lb 0.6 oz (81.2 kg)  10/29/18 160 lb (72.6 kg)  03/05/18 160 lb (72.6 kg)   - CBC - COMPLETE METABOLIC PANEL WITH GFR        Follow-up: 2 weeks by phone  Freddy Finner, DNP, AGNP-BC [facility] [address], [address] [place], [place] [address] Office Hours: Mon-Thurs 8 am-5 pm; Fri 8 am-12 pm Office Phone: [phone]  Office Fax: [phone]

## 2018-12-08 NOTE — Patient Instructions (Signed)
  I appreciate the opportunity to provide you with care for your health and wellness. Today we discussed: overall health   Follow up: 2 weeks by phone   Labs when you can, fasting   GOALS: Start medication  Focus on self care: spend 5 minutes away from other people and technology. To reset.  Try and get 2-3 days of activity walking or dancing & listening to music.  I hope you have a wonderful, happy, safe, and healthy Holiday Season!  Please continue to practice social distancing to keep you, your family, and our community safe.  If you must go out, please wear a mask and practice good handwashing.  It was a pleasure to see you and I look forward to continuing to work together on your health and well-being. Please do not hesitate to call the office if you need care or have questions about your care.  Have a wonderful day and week. With Gratitude, Cherly Beach, DNP, AGNP-BC

## 2018-12-09 DIAGNOSIS — F419 Anxiety disorder, unspecified: Secondary | ICD-10-CM | POA: Insufficient documentation

## 2018-12-09 DIAGNOSIS — E669 Obesity, unspecified: Secondary | ICD-10-CM | POA: Diagnosis not present

## 2018-12-09 DIAGNOSIS — E785 Hyperlipidemia, unspecified: Secondary | ICD-10-CM | POA: Diagnosis not present

## 2018-12-09 DIAGNOSIS — Z8673 Personal history of transient ischemic attack (TIA), and cerebral infarction without residual deficits: Secondary | ICD-10-CM | POA: Insufficient documentation

## 2018-12-10 ENCOUNTER — Encounter: Payer: Self-pay | Admitting: Family Medicine

## 2018-12-10 LAB — HEMOGLOBIN A1C
Hgb A1c MFr Bld: 5.5 % of total Hgb (ref <5.7)
Mean Plasma Glucose: 111 (calc)
eAG (mmol/L): 6.2 (calc)

## 2018-12-10 LAB — COMPLETE METABOLIC PANEL WITH GFR
AG Ratio: 1.6 (calc) (ref 1.0–2.5)
ALT: 16 U/L (ref 6–29)
AST: 18 U/L (ref 10–30)
Albumin: 3.9 g/dL (ref 3.6–5.1)
Alkaline phosphatase (APISO): 43 U/L (ref 31–125)
BUN: 12 mg/dL (ref 7–25)
CO2: 26 mmol/L (ref 20–32)
Calcium: 9.1 mg/dL (ref 8.6–10.2)
Chloride: 105 mmol/L (ref 98–110)
Creat: 0.84 mg/dL (ref 0.50–1.10)
GFR, Est African American: 102 mL/min/{1.73_m2} (ref >=60)
GFR, Est Non African American: 88 mL/min/{1.73_m2} (ref >=60)
Globulin: 2.5 g/dL (calc) (ref 1.9–3.7)
Glucose, Bld: 93 mg/dL (ref 65–99)
Potassium: 3.8 mmol/L (ref 3.5–5.3)
Sodium: 138 mmol/L (ref 135–146)
Total Bilirubin: 0.4 mg/dL (ref 0.2–1.2)
Total Protein: 6.4 g/dL (ref 6.1–8.1)

## 2018-12-10 LAB — CBC
HCT: 37.2 % (ref 35.0–45.0)
Hemoglobin: 12.2 g/dL (ref 11.7–15.5)
MCH: 31.2 pg (ref 27.0–33.0)
MCHC: 32.8 g/dL (ref 32.0–36.0)
MCV: 95.1 fL (ref 80.0–100.0)
MPV: 11.4 fL (ref 7.5–12.5)
Platelets: 208 10*3/uL (ref 140–400)
RBC: 3.91 10*6/uL (ref 3.80–5.10)
RDW: 12.1 % (ref 11.0–15.0)
WBC: 3.9 10*3/uL (ref 3.8–10.8)

## 2018-12-10 LAB — LIPID PANEL
Cholesterol: 178 mg/dL (ref <200)
HDL: 64 mg/dL (ref >=50)
LDL Cholesterol (Calc): 101 mg/dL (calc) — ABNORMAL HIGH
Non-HDL Cholesterol (Calc): 114 mg/dL (calc) (ref <130)
Total CHOL/HDL Ratio: 2.8 (calc) (ref <5.0)
Triglycerides: 44 mg/dL (ref <150)

## 2018-12-10 LAB — TSH: TSH: 3.22 mIU/L

## 2018-12-10 NOTE — Progress Notes (Signed)
Please call Marissa [name] and let her know her labs have resulted. Blood levels are in range. Kidney and liver are great. A1c is 5.5% this is great 5.7% starts the range for prediabetic state making sure that she keeps a healthy diet and exercise to prevent this from turning into prediabetes and then diabetes. Overall cholesterol is good does have a one-point increase in her bad cholesterol.  So she is at 101 would like it to be 100 or lower early.  Watching fat intake is very beneficial for this and increasing exercise. Thyroid is in range. Overall labs look good. Focus on exercise, eating a well-balanced high vegetable and colorful diet and avoiding animal fats.

## 2018-12-22 ENCOUNTER — Telehealth (INDEPENDENT_AMBULATORY_CARE_PROVIDER_SITE_OTHER): Payer: Medicaid Other | Admitting: Family Medicine

## 2018-12-22 ENCOUNTER — Other Ambulatory Visit: Payer: Self-pay

## 2018-12-22 ENCOUNTER — Encounter: Payer: Self-pay | Admitting: Family Medicine

## 2018-12-22 DIAGNOSIS — F419 Anxiety disorder, unspecified: Secondary | ICD-10-CM | POA: Diagnosis not present

## 2018-12-22 DIAGNOSIS — Z124 Encounter for screening for malignant neoplasm of cervix: Secondary | ICD-10-CM | POA: Diagnosis not present

## 2018-12-22 NOTE — Progress Notes (Signed)
Virtual Visit via Video Note   This visit type was conducted due to national recommendations for restrictions regarding the COVID-19 Pandemic (e.g. social distancing) in an effort to limit this patient's exposure and mitigate transmission in our community.  Due to her co-morbid illnesses, this patient is at least at moderate risk for complications without adequate follow up.  This format is felt to be most appropriate for this patient at this time.  All issues noted in this document were discussed and addressed.  A limited physical exam was performed with this format.    Evaluation Performed:  Follow-up visit  Date:  12/22/2018   ID:  Marissa [name], DOB 14-Aug-1980, MRN [id number]  Patient Location: Home Provider Location: Office  Location of Patient: Home Location of Provider: Telehealth Consent was obtain for visit to be over via telehealth. I verified that I am speaking with the correct person using two identifiers.  PCP:  Patient, No Pcp Per   Chief Complaint: Follow-up anxiety  History of Present Illness:    Marissa [name] is a 38 y.o. female with She has a significant history for allergies-seasonal, anxiety, migraine, stroke history, patent foramen ovale repair, vitamin D deficiency. Reports that she has been doing much better.  Never started medications.  Reported that her family questioned if it was just anxiety.  Reported that after the holiday season for Thanksgiving was over she felt much better.  She reports only having one event with shaking/tremoring did not last very long.  In she did not have excessive tiredness.  Reports that she is not been ill with Dr. Ronal Fear office secondary to [facility] not having me listed as her PCP.  She has placed calls to try to get this fixed.  She reports that she wonders if she is starting menopause because of the hot flashes that she continues to have.  But she reports that she has normal periods pretty much every week.  Has  not had any period where she did not have any cycles.  Does need Pap smear and is willing to be referred to GYN for further follow-up.  The patient does not have symptoms concerning for COVID-19 infection (fever, chills, cough, or new shortness of breath).   Past Medical, Surgical, Social History, Allergies, and Medications have been Reviewed.   Past Medical History:  Diagnosis Date  . Allergy    seasonal  . Anxiety   . Common migraine with intractable migraine 11/08/2015  . Hyperlipidemia   . Stroke (HCC)   . Stroke (HCC)   . Vitamin D deficiency    Past Surgical History:  Procedure Laterality Date  . CESAREAN SECTION    . 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 TUBAL LIGATION       Current Meds  Medication Sig  . aspirin 81 MG chewable tablet Chew 81 mg by mouth once as needed.  Marland Kitchen ibuprofen (ADVIL,MOTRIN) 600 MG tablet Take 1 tablet (600 mg total) by mouth every 6 (six) hours as needed.     Allergies:   Triptans and Morphine and related   Social History   Tobacco Use  . Smoking status: Never Smoker  . Smokeless tobacco: Never Used  Substance Use Topics  . Alcohol use: No  . Drug use: No     Family Hx: The patient's family history includes COPD in her maternal grandfather; Cancer in her maternal grandfather; Heart disease in her maternal aunt, maternal grandmother, and maternal uncle; Hypertension in  her mother. There is no history of Migraines.  ROS:   Please see the history of present illness.    All other systems reviewed and are negative.   Labs/Other Tests and Data Reviewed:     Recent Labs: 12/09/2018: ALT 16; BUN 12; Creat 0.84; Hemoglobin 12.2; Platelets 208; Potassium 3.8; Sodium 138; TSH 3.22   Recent Lipid Panel Lab Results  Component Value Date/Time   CHOL 178 12/09/2018 09:08 AM   TRIG 44 12/09/2018 09:08 AM   HDL 64 12/09/2018 09:08 AM   CHOLHDL 2.8 12/09/2018 09:08 AM   LDLCALC 101 (H) 12/09/2018 09:08 AM    Wt Readings from Last  3 Encounters:  12/08/18 179 lb 0.6 oz (81.2 kg)  10/29/18 160 lb (72.6 kg)  03/05/18 160 lb (72.6 kg)     Objective:    Vital Signs:  There were no vitals taken for this visit.   GEN:  alert and oriented RESPIRATORY:  no shortness of breath in conversation PSYCH:  normal affect and mood, good eye contact and communication  ASSESSMENT & PLAN:    1. Anxiety Reports improvement.  Declines wanting to start any medications.  Provided with extensive holistic approaches such as yoga, meditation, focus on self-care.  She is willing to do this at this time.  Semiclose follow-up with 3 months advised to notify the office if she needs in a sooner appointment.  Also can use my chart.  Additionally waiting for follow-up from neurology once insurance can be fixed.   2. Encounter for screening for malignant neoplasm of cervix Referral for Pap smear needed.  Placed for OBGYN  - Ambulatory referral to Obstetrics / Gynecology    Time:   Today, I have spent 10 minutes with the patient with telehealth technology discussing the above problems.     Medication Adjustments/Labs and Tests Ordered: Current medicines are reviewed at length with the patient today.  Concerns regarding medicines are outlined above.   Tests Ordered: No orders of the defined types were placed in this encounter.   Medication Changes: No orders of the defined types were placed in this encounter.   Disposition:  Follow up 3 months   Signed, Perlie Mayo, NP  12/22/2018 10:26 AM     [facility]

## 2018-12-22 NOTE — Patient Instructions (Addendum)
  I appreciate the opportunity to provide you with care for your health and wellness. Today we discussed: anxiety and referrals  Follow up: 3 month check in for anxiety-mychart   No labs   Referrals to GYN for pap   GOALS:  Manage Stress and Anxiety: Try and get 2-3 days of activity walking or dancing & listening to music. Yoga and Meditation are great for anxiety and natural.   I hope you have a wonderful, happy, safe, and healthy Holiday Season! See you in the New Year :)  Please continue to practice social distancing to keep you, your family, and our community safe.  If you must go out, please wear a mask and practice good handwashing.  It was a pleasure to see you and I look forward to continuing to work together on your health and well-being. Please do not hesitate to call the office if you need care or have questions about your care.  Have a wonderful day and week. With Gratitude, Cherly Beach, DNP, AGNP-BC

## 2018-12-30 ENCOUNTER — Other Ambulatory Visit (HOSPITAL_COMMUNITY)
Admission: RE | Admit: 2018-12-30 | Discharge: 2018-12-30 | Disposition: A | Discharge status: Home / Self Care | Payer: Medicaid Other | Source: Ambulatory Visit | Attending: Adult Health | Admitting: Adult Health

## 2018-12-30 ENCOUNTER — Encounter: Payer: Self-pay | Admitting: Adult Health

## 2018-12-30 ENCOUNTER — Ambulatory Visit (INDEPENDENT_AMBULATORY_CARE_PROVIDER_SITE_OTHER): Payer: Medicaid Other | Admitting: Adult Health

## 2018-12-30 ENCOUNTER — Other Ambulatory Visit: Payer: Self-pay

## 2018-12-30 VITALS — BP 116/76 | HR 74 | Ht 62.0 in | Wt 180.0 lb

## 2018-12-30 DIAGNOSIS — Z01419 Encounter for gynecological examination (general) (routine) without abnormal findings: Secondary | ICD-10-CM

## 2018-12-30 DIAGNOSIS — Z0000 Encounter for general adult medical examination without abnormal findings: Secondary | ICD-10-CM

## 2018-12-30 HISTORY — DX: Encounter for gynecological examination (general) (routine) without abnormal findings: Z01.419

## 2018-12-30 NOTE — Progress Notes (Signed)
Patient ID: Marissa [name], female   DOB: [birth year], 38 y.o.   MRN: [id number] History of Present Illness: Marissa [name] is a 38 year old black female,married, G3P2103 in for a pelvic and pap.She had physical at PCP PCP is Cherly Beach NP.   Current Medications, Allergies, Past Medical History, Past Surgical History, Family History and Social History were reviewed in [employer] record.     Review of Systems:  Patient denies any headaches, hearing loss, fatigue, blurred vision, shortness of breath, chest pain, abdominal pain, problems with bowel movements, urination, or intercourse. No joint pain or mood swings. Some hot flashes and night sweats and not sleeping well. She was prescribed lexapro and Buspar for ?anxiety but did not take and is not taking baby ASA.  Physical Exam:BP 116/76 (BP Location: Left Arm, Patient Position: Sitting, Cuff Size: Normal)   Pulse 74   Ht 5\' 2"  (1.575 m)   Wt 180 lb (81.6 kg)   LMP 11/28/2018   BMI 32.92 kg/m  General:  Well developed, well nourished, no acute distress Skin:  Warm and dry Neck:  Midline trachea, normal thyroid, good ROM, no lymphadenopathy Lungs; Clear to auscultation bilaterally Cardiovascular: Regular rate and rhythm Pelvic:  External genitalia is normal in appearance, no lesions.  The vagina is normal in appearance. Urethra has no lesions or masses. The cervix is bulbous and smooth, pap with GC/CHL and  high risk HPV 16/18 genotyping performed.  Uterus is felt to be normal size, shape, and contour.  No adnexal masses or tenderness noted.Bladder is non tender, no masses felt. Extremities/musculoskeletal:  No swelling or varicosities noted, no clubbing or cyanosis Psych:  No mood changes, alert and cooperative,seems happy Fall risk is low PHQ 2 score 0. Co exam with Weyman Croon FNP student.   Impression and Plan: 1. Encounter for gynecological examination with Papanicolaou smear of cervix Pap sent Physical and  labs with PCP Get mammogram at 40 Pap in 3 years if normal Take the baby ASA  Discussed SSRI for hot flashes, but no HRT due to her history of stroke

## 2019-01-03 LAB — CYTOLOGY - PAP
Adequacy: ABSENT
Chlamydia: NEGATIVE
Comment: NEGATIVE
Comment: NEGATIVE
Comment: NORMAL
Diagnosis: NEGATIVE
High risk HPV: NEGATIVE
Neisseria Gonorrhea: NEGATIVE

## 2019-03-22 ENCOUNTER — Encounter: Payer: Self-pay | Admitting: Family Medicine

## 2019-03-22 ENCOUNTER — Telehealth (INDEPENDENT_AMBULATORY_CARE_PROVIDER_SITE_OTHER): Payer: Medicaid Other | Admitting: Family Medicine

## 2019-03-22 ENCOUNTER — Other Ambulatory Visit: Payer: Self-pay

## 2019-03-22 DIAGNOSIS — E669 Obesity, unspecified: Secondary | ICD-10-CM

## 2019-03-22 DIAGNOSIS — F419 Anxiety disorder, unspecified: Secondary | ICD-10-CM

## 2019-03-22 NOTE — Assessment & Plan Note (Signed)
Deteriorated  Marissa [name] is re-educated about the importance of exercise daily to help with weight management. A minumum of 30 minutes daily is recommended. Additionally, importance of healthy food choices  with portion control discussed.   Wt Readings from Last 3 Encounters:  12/30/18 180 lb (81.6 kg)  12/08/18 179 lb 0.6 oz (81.2 kg)  10/29/18 160 lb (72.6 kg)

## 2019-03-22 NOTE — Patient Instructions (Addendum)
I appreciate the opportunity to provide you with care for your health and wellness. Today we discussed: anxiety   Follow up: July Annual no pap -fasting day of for labs  No labs or referrals today  Continue to focus on self care. I am glad you are seeing improvement overall. Use the Bupsar as needed for anxiety attacks. If you do not feel better, please call me back. These recent attacks could all be hormone driven and might calm down shortly.  Please continue to practice social distancing to keep you, your family, and our community safe.  If you must go out, please wear a mask and practice good handwashing.  It was a pleasure to see you and I look forward to continuing to work together on your health and well-being. Please do not hesitate to call the office if you need care or have questions about your care.  Have a wonderful day and week. With Gratitude, Tereasa Coop, DNP, AGNP-BC

## 2019-03-22 NOTE — Progress Notes (Signed)
Virtual Visit via Video Note   This visit type was conducted due to national recommendations for restrictions regarding the COVID-19 Pandemic (e.g. social distancing) in an effort to limit this patient's exposure and mitigate transmission in our community.  Due to her co-morbid illnesses, this patient is at least at moderate risk for complications without adequate follow up.  This format is felt to be most appropriate for this patient at this time.  All issues noted in this document were discussed and addressed.  A limited physical exam was performed with this format.   Evaluation Performed:  Follow-up visit  Date:  03/22/2019   ID:  Marissa [name], DOB 11/04/1980, MRN [id number]  Patient Location: Home Provider Location: Office  Location of Patient: Home Location of Provider: Telehealth Consent was obtain for visit to be over via telehealth. I verified that I am speaking with the correct person using two identifiers.  PCP:  Freddy Finner, NP   Chief Complaint: Anxiety  History of Present Illness:    Marissa [name] is a 39 y.o. female with history of anxiety.  Previously established with me last year.  She declined wanting to start any medication for anxiety.  She wanted to focus on holistic approaches such as yoga, meditation, self-care.  And follow-up in 3 months to see how she is doing. She reports recent anxiety attacks in the last week, but when asked she is close to her cycle time, and that has caused anxiety in the past. She denies any issues or anxiety prior to that and reports she was doing well with holistic care. And wants to continue this.   The patient does not have symptoms concerning for COVID-19 infection (fever, chills, cough, or new shortness of breath).   Past Medical, Surgical, Social History, Allergies, and Medications have been Reviewed.  Past Medical History:  Diagnosis Date  . Allergy    seasonal  . Anxiety   . Common migraine with intractable  migraine 11/08/2015  . Hyperlipidemia   . Stroke (HCC)   . Stroke (HCC)   . Vitamin D deficiency    Past Surgical History:  Procedure Laterality Date  . CESAREAN SECTION    . 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 TUBAL LIGATION       Current Meds  Medication Sig  . aspirin 81 MG chewable tablet Chew 81 mg by mouth once as needed.  Marland Kitchen ibuprofen (ADVIL,MOTRIN) 600 MG tablet Take 1 tablet (600 mg total) by mouth every 6 (six) hours as needed.     Allergies:   Triptans and Morphine and related   ROS:   Please see the history of present illness.    All other systems reviewed and are negative.   Labs/Other Tests and Data Reviewed:    Recent Labs: 12/09/2018: ALT 16; BUN 12; Creat 0.84; Hemoglobin 12.2; Platelets 208; Potassium 3.8; Sodium 138; TSH 3.22   Recent Lipid Panel Lab Results  Component Value Date/Time   CHOL 178 12/09/2018 09:08 AM   TRIG 44 12/09/2018 09:08 AM   HDL 64 12/09/2018 09:08 AM   CHOLHDL 2.8 12/09/2018 09:08 AM   LDLCALC 101 (H) 12/09/2018 09:08 AM    Wt Readings from Last 3 Encounters:  12/30/18 180 lb (81.6 kg)  12/08/18 179 lb 0.6 oz (81.2 kg)  10/29/18 160 lb (72.6 kg)     Objective:    Vital Signs:  There were no vitals taken for this visit.   GEN:  alert  and oriented  RESPIRATORY:  no shortness of breath noted in conversation  PSYCH:  normal affect  Depression screen [facility] 2/9 03/22/2019 12/30/2018 12/22/2018  Decreased Interest 0 0 0  Down, Depressed, Hopeless 0 0 0  PHQ - 2 Score 0 0 0   GAD 7 : Generalized Anxiety Score 03/22/2019 12/08/2018  Nervous, Anxious, on Edge 1 0  Control/stop worrying 1 1  Worry too much - different things 0 1  Trouble relaxing 0 0  Restless 0 0  Easily annoyed or irritable 0 1  Afraid - awful might happen 0 1  Total GAD 7 Score 2 4  Anxiety Difficulty Somewhat difficult Somewhat difficult     ASSESSMENT & PLAN:    1. Anxiety   2. Obesity (BMI 30.0-34.9)   Time:   Today, I have spent 20  minutes with the patient with telehealth technology discussing the above problems.     Medication Adjustments/Labs and Tests Ordered: Current medicines are reviewed at length with the patient today.  Concerns regarding medicines are outlined above.   Tests Ordered: No orders of the defined types were placed in this encounter.   Medication Changes: No orders of the defined types were placed in this encounter.   Disposition:  Follow up July annual no pap  Signed, Perlie Mayo, NP  03/22/2019 10:52 AM     [place]

## 2019-03-22 NOTE — Assessment & Plan Note (Signed)
Advised to use buspar as needed. Anxiety appears to be related to hormones, as cycle is near. She is advised to contact office if the anxiety attacks do not subside post cycle.  Reviewed side effects, risks and benefits of medication.   Patient acknowledged agreement and understanding of the plan.

## 2019-04-14 ENCOUNTER — Ambulatory Visit: Payer: Medicaid Other | Attending: Internal Medicine

## 2019-04-14 DIAGNOSIS — Z23 Encounter for immunization: Secondary | ICD-10-CM

## 2019-04-14 NOTE — Progress Notes (Signed)
   Covid-19 Vaccination Clinic  Name:  Marissa [name]    MRN: [id number] DOB: [birth year]  04/14/2019  [name] was observed post Covid-19 immunization for 15 minutes without incident. She was provided with Vaccine Information Sheet and instruction to access the V-Safe system.   [name] was instructed to call 911 with any severe reactions post vaccine: Marland Kitchen Difficulty breathing  . Swelling of face and throat  . A fast heartbeat  . A bad rash all over body  . Dizziness and weakness   Immunizations Administered    Name Date Dose VIS Date Route   Moderna COVID-19 Vaccine 04/14/2019  2:03 PM 0.5 mL 12/21/2018 Intramuscular   Manufacturer: Moderna   Lot: [id number]   NDC: 76701-100-34

## 2019-05-12 ENCOUNTER — Ambulatory Visit: Payer: Medicaid Other | Attending: Internal Medicine

## 2019-05-12 ENCOUNTER — Telehealth: Payer: Self-pay | Admitting: *Deleted

## 2019-05-12 DIAGNOSIS — Z23 Encounter for immunization: Secondary | ICD-10-CM

## 2019-05-12 NOTE — Telephone Encounter (Signed)
Called and spoke with the patient in regards to her second vaccine that she received today. She states that she is feeling ok she is resting right now. She states that when she got her vaccine early she was very anxious and she experienced a "funny" feeling in her mouth and became hot. Her symptoms soon resided and she left and went home. She states that right now she feels fine just that her arm is sore and she was having a little bit of leg pain. Advised to the patient that it did not seem like her symptoms were allergic in nature but to please call with any future questions or concerns or advise, especially before receiving any COVID vaccine boosters. Patient verbalized understanding.

## 2019-05-12 NOTE — Progress Notes (Signed)
   Covid-19 Vaccination Clinic  Name:  Marissa [name]    MRN: [id number] DOB: 30-May-1980  05/12/2019  Marissa [name] was observed post Covid-19 immunization for 30 minutes based on pre-vaccination screening without incident. She was provided with Vaccine Information Sheet and instruction to access the V-Safe system.   Marissa [name] was instructed to call 911 with any severe reactions post vaccine: Marland Kitchen Difficulty breathing  . Swelling of face and throat  . A fast heartbeat  . A bad rash all over body  . Dizziness and weakness   Immunizations Administered    Name Date Dose VIS Date Route   Moderna COVID-19 Vaccine 05/12/2019 10:43 AM 0.5 mL 12/2018 Intramuscular   Manufacturer: Moderna   Lot: [id number]   NDC: 94174-081-44

## 2019-05-12 NOTE — Progress Notes (Signed)
   Covid-19 Vaccination Clinic  Name:  Marissa [name]    MRN: [id number] DOB: 02-Jul-1980  05/12/2019  Marissa [name] was observed post Covid-19 immunization for 30 minutes based on pre-vaccination screening .  During the observation period, she experienced an adverse reaction with the following symptoms:  Felt like she did with first injection, throat not well, no difficulty swallowing.  Assessment : Time of assessment . Alert and oriented.  Actions taken:  monitored for additional 15 minutes  There were no vitals filed for this visit.  Medications administered: No medication administered.  Disposition:Instructed to call 911 for trouble breathing, rapid heart rate, dizziness, swelling of tongue or throat.   Immunizations Administered    Name Date Dose VIS Date Route   Moderna COVID-19 Vaccine 05/12/2019 10:43 AM 0.5 mL 12/2018 Intramuscular   Manufacturer: Moderna   Lot: [id number]   NDC: 51025-852-77

## 2019-06-08 ENCOUNTER — Encounter: Payer: Self-pay | Admitting: Family Medicine

## 2019-06-08 NOTE — Telephone Encounter (Signed)
Pt advised to go to nearest emergency room due to the sob and weakness with verbal understanding

## 2019-08-10 ENCOUNTER — Encounter: Payer: Medicaid Other | Admitting: Family Medicine

## 2019-08-23 ENCOUNTER — Telehealth: Payer: Medicaid Other | Admitting: Family Medicine

## 2019-08-30 ENCOUNTER — Encounter: Payer: Self-pay | Admitting: Family Medicine

## 2019-08-30 ENCOUNTER — Other Ambulatory Visit: Payer: Self-pay

## 2019-08-30 ENCOUNTER — Other Ambulatory Visit: Payer: Self-pay | Admitting: *Deleted

## 2019-08-30 ENCOUNTER — Ambulatory Visit (INDEPENDENT_AMBULATORY_CARE_PROVIDER_SITE_OTHER): Payer: Medicaid Other | Admitting: Family Medicine

## 2019-08-30 VITALS — BP 108/72 | HR 77 | Temp 97.6°F | Resp 18 | Ht 62.0 in | Wt 179.8 lb

## 2019-08-30 DIAGNOSIS — E785 Hyperlipidemia, unspecified: Secondary | ICD-10-CM | POA: Diagnosis not present

## 2019-08-30 DIAGNOSIS — Q211 Atrial septal defect: Secondary | ICD-10-CM | POA: Diagnosis not present

## 2019-08-30 DIAGNOSIS — E669 Obesity, unspecified: Secondary | ICD-10-CM

## 2019-08-30 DIAGNOSIS — G43019 Migraine without aura, intractable, without status migrainosus: Secondary | ICD-10-CM | POA: Diagnosis not present

## 2019-08-30 DIAGNOSIS — E559 Vitamin D deficiency, unspecified: Secondary | ICD-10-CM

## 2019-08-30 DIAGNOSIS — Z131 Encounter for screening for diabetes mellitus: Secondary | ICD-10-CM

## 2019-08-30 DIAGNOSIS — Z0001 Encounter for general adult medical examination with abnormal findings: Secondary | ICD-10-CM | POA: Diagnosis not present

## 2019-08-30 DIAGNOSIS — Q2112 Patent foramen ovale: Secondary | ICD-10-CM

## 2019-08-30 DIAGNOSIS — Z8673 Personal history of transient ischemic attack (TIA), and cerebral infarction without residual deficits: Secondary | ICD-10-CM

## 2019-08-30 NOTE — Assessment & Plan Note (Signed)
Updated labs ordered, encouraged heart healthy low-fat diet

## 2019-08-30 NOTE — Assessment & Plan Note (Signed)
Discussed monthly self breast exams and yearly mammograms at 40 ; at least 30 minutes of aerobic activity at least 5 days/week and weight-bearing exercise 2x/week; proper sunscreen use reviewed; healthy diet, including goals of calcium and vitamin D intake and alcohol recommendations (less than or equal to 1 drink/day) reviewed; regular seatbelt use; changing batteries in smoke detectors.  Immunization recommendations discussed.  Colonoscopy recommendations reviewed for up coming years

## 2019-08-30 NOTE — Assessment & Plan Note (Signed)
Unchanged, encouraged exercise outside of work.  At least 30 minutes daily.  Any importance of healthy food choices.

## 2019-08-30 NOTE — Assessment & Plan Note (Signed)
Vitamin D deficiency in the past.  Will be getting vitamin D update labs.

## 2019-08-30 NOTE — Progress Notes (Signed)
Health Maintenance reviewed -   Immunization History  Administered Date(s) Administered  . Moderna SARS-COVID-2 Vaccination 04/14/2019, 05/12/2019   Last Pap smear: 12/2018 Last mammogram: Due next year  Last colonoscopy: Due 45-39 years old  Last DEXA: Due 33  Dentist: Yearly  Ophtho: Does not see one, but Left eye is weaker on exam today  Exercise: at work-[employer] : [employer]  Smoker: no  Alcohol Use: no   Other doctors caring for patient include:  Patient Care Team: Freddy Finner, NP as PCP - General (Family Medicine)  End of Life Discussion:  Patient does not have a living will and medical power of attorney  Subjective:   HPI  Marissa [name] is a 39 y.o. female who presents for annual wellness visit and follow-up on chronic medical conditions.  She has the following concerns: Reports bruising easily without known reason or injury.  Is not currently taking the baby aspirin that was recommended after her stroke several years back.  Did have a patent POA but that was repaired.  She denies having any sleep issues that she is unable to do at this time.  Denies any changes in dentition chewing or swallowing.  Reports seeing a dentist regularly.  Denies having trouble going to bathroom.  Denies having any blood in urine or stool.  Denies having any memory issues.  Denies having any injuries.  Denies having any hearing changes.  Does report some changes with her vision that she is currently checked out here soon.  Denies having any cough, fever, chills, chest pain, shortness of breath, heart rate changes.  Reports 1-2 headaches a week that are tolerated and treated well with high-dose ibuprofen as needed.   Review Of Systems  Review of Systems  Constitutional: Negative.   HENT: Negative.   Eyes: Negative.   Respiratory: Negative.   Cardiovascular: Negative.   Gastrointestinal: Negative.   Endocrine: Negative.   Genitourinary: Negative.   Musculoskeletal: Negative.    Skin: Negative.   Allergic/Immunologic: Negative.   Neurological: Negative.   Hematological: Negative.   Psychiatric/Behavioral: Negative.   All other systems reviewed and are negative.   Objective:   PHYSICAL EXAM:  BP 108/72 (BP Location: Right Arm, Patient Position: Sitting, Cuff Size: Normal)   Pulse 77   Temp 97.6 F (36.4 C) (Temporal)   Resp 18   Ht 5\' 2"  (1.575 m)   Wt 179 lb 12.8 oz (81.6 kg)   SpO2 98%   BMI 32.89 kg/m   Physical Exam Vitals and nursing note reviewed.  Constitutional:      Appearance: Normal appearance. She is well-developed and well-groomed. She is obese.  HENT:     Head: Normocephalic.     Right Ear: Hearing, tympanic membrane, ear canal and external ear normal.     Left Ear: Hearing, tympanic membrane, ear canal and external ear normal.     Nose: Nose normal.     Mouth/Throat:     Lips: Pink.     Mouth: Mucous membranes are moist.     Pharynx: Oropharynx is clear. Uvula midline.  Eyes:     General: Lids are normal.     Extraocular Movements: Extraocular movements intact.     Conjunctiva/sclera: Conjunctivae normal.     Pupils: Pupils are equal, round, and reactive to light.  Neck:     Thyroid: No thyroid mass, thyromegaly or thyroid tenderness.  Cardiovascular:     Rate and Rhythm: Normal rate and regular rhythm.  Pulses: Normal pulses.          Radial pulses are 2+ on the right side and 2+ on the left side.       Dorsalis pedis pulses are 2+ on the right side and 2+ on the left side.     Heart sounds: Normal heart sounds.  Pulmonary:     Effort: Pulmonary effort is normal.     Breath sounds: Normal breath sounds and air entry.  Abdominal:     General: Abdomen is flat. Bowel sounds are normal.     Palpations: Abdomen is soft.     Tenderness: There is no abdominal tenderness. There is no right CVA tenderness or left CVA tenderness.  Musculoskeletal:        General: Normal range of motion.     Cervical back: Full passive range  of motion without pain, normal range of motion and neck supple.     Right lower leg: No edema.     Left lower leg: No edema.     Comments: MAE, ROM intact  She has no bony tenderness.  However she did roll to her side to get up off of the examination table.  Lymphadenopathy:     Cervical: No cervical adenopathy.  Skin:    General: Skin is warm and dry.     Capillary Refill: Capillary refill takes less than 2 seconds.  Neurological:     General: No focal deficit present.     Mental Status: She is alert and oriented to person, place, and time. Mental status is at baseline.     Cranial Nerves: Cranial nerves are intact.     Sensory: Sensation is intact.     Motor: Motor function is intact.     Coordination: Coordination is intact.     Gait: Gait is intact.     Deep Tendon Reflexes: Reflexes are normal and symmetric.  Psychiatric:        Attention and Perception: Attention and perception normal.        Mood and Affect: Mood and affect normal.        Speech: Speech normal.        Behavior: Behavior normal. Behavior is cooperative.        Thought Content: Thought content normal.        Cognition and Memory: Cognition normal.        Judgment: Judgment normal.     Depression Screening  Depression screen [facility] 2/9 08/30/2019 03/22/2019 12/30/2018 12/22/2018 12/08/2018  Decreased Interest 0 0 0 0 0  Down, Depressed, Hopeless 0 0 0 0 0  PHQ - 2 Score 0 0 0 0 0     Falls  Fall Risk  08/30/2019 03/22/2019 12/30/2018 12/22/2018 12/08/2018  Falls in the past year? 1 0 0 0 0  Number falls in past yr: 0 0 0 0 0  Injury with Fall? 0 0 0 0 0  Risk for fall due to : History of fall(s) - - - -  Follow up Falls evaluation completed;Education provided;Falls prevention discussed - Falls evaluation completed - -    Assessment & Plan:   1. Annual visit for general adult medical examination with abnormal findings   2. PFO (patent foramen ovale)   3. Common migraine with intractable migraine   4.  Hyperlipidemia, unspecified hyperlipidemia type   5. Vitamin D deficiency   6. Obesity (BMI 30.0-34.9)   7. History of CVA (cerebrovascular accident)     Tests ordered No orders of  the defined types were placed in this encounter.    Plan: Please see assessment and plan per problem list above.   No orders of the defined types were placed in this encounter.  I have personally reviewed: The patient's medical and social his btory Their use of alcohol, tobacco or illicit drugs Their current medications and supplements The patient's functional ability including ADLs,fall risks, home safety risks, cognitive, and hearing and visual impairment Diet and physical activities Evidence for depression or mood disorders  The patient's weight, height, BMI, and visual acuity have been recorded in the chart.  I have made referrals, counseling, and provided education to the patient based on review of the above and I have provided the patient with a written personalized care plan for preventive services.    Note: This dictation was prepared with Dragon dictation along with smaller phrase technology. Similar sounding words can be transcribed inadequately or may not be corrected upon review. Any transcriptional errors that result from this process are unintentional.      Freddy Finner, NP   08/30/2019

## 2019-08-30 NOTE — Patient Instructions (Signed)
I appreciate the opportunity to provide you with care for your health and wellness. Today we discussed: overall health   Follow up: 1 year for annual-fasting morning appt if able and as needed  Annual fasting labs- within the week (fast for at least 8 hrs) at [facility] Referrals today: Ortho for back pain  I think doing walking, core exercises, yoga can help strengthen your back muscles. Ortho might have other ideas  Please continue to practice social distancing to keep you, your family, and our community safe.  If you must go out, please wear a mask and practice good handwashing.  It was a pleasure to see you and I look forward to continuing to work together on your health and well-being. Please do not hesitate to call the office if you need care or have questions about your care.  Have a wonderful day and week. With Gratitude, Tereasa Coop, DNP, AGNP-BC  HEALTH MAINTENANCE RECOMMENDATIONS:  It is recommended that you get at least 30 minutes of aerobic exercise at least 5 days/week (for weight loss, you may need as much as 60-90 minutes). This can be any activity that gets your heart rate up. This can be divided in 10-15 minute intervals if needed, but try and build up your endurance at least once a week.  Weight bearing exercise is also recommended twice weekly.  Eat a healthy diet with lots of vegetables, fruits and fiber.  "Colorful" foods have a lot of vitamins (ie green vegetables, tomatoes, red peppers, etc).  Limit sweet tea, regular sodas and alcoholic beverages, all of which has a lot of calories and sugar.  Up to 1 alcoholic drink daily may be beneficial for women (unless trying to lose weight, watch sugars).  Drink a lot of water.  Calcium recommendations are 1200-1500 mg daily (1500 mg for postmenopausal women or women without ovaries), and vitamin D 1000 IU daily.  This should be obtained from diet and/or supplements (vitamins), and calcium should not be taken all at once, but  in divided doses.  Monthly self breast exams and yearly mammograms for women over the age of 48 is recommended.  Sunscreen of at least SPF 30 should be used on all sun-exposed parts of the skin when outside between the hours of 10 am and 4 pm (not just when at beach or pool, but even with exercise, golf, tennis, and yard work!)  Use a sunscreen that says "broad spectrum" so it covers both UVA and UVB rays, and make sure to reapply every 1-2 hours.  Remember to change the batteries in your smoke detectors when changing your clock times in the spring and fall.  Use your seat belt every time you are in a car, and please drive safely and not be distracted with cell phones and texting while driving.

## 2019-08-30 NOTE — Assessment & Plan Note (Signed)
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  Not currently on baby aspirin previously was told to be on 1.

## 2019-08-30 NOTE — Assessment & Plan Note (Signed)
Controlled at this time continue current treatment measures as needed

## 2019-08-30 NOTE — Assessment & Plan Note (Signed)
Repaired as per notes. Denies having any irregularities with palpitations unless she is having anxiety

## 2019-09-05 ENCOUNTER — Other Ambulatory Visit: Payer: Self-pay | Admitting: Family Medicine

## 2019-09-05 DIAGNOSIS — E559 Vitamin D deficiency, unspecified: Secondary | ICD-10-CM | POA: Diagnosis not present

## 2019-09-05 DIAGNOSIS — Z8673 Personal history of transient ischemic attack (TIA), and cerebral infarction without residual deficits: Secondary | ICD-10-CM | POA: Diagnosis not present

## 2019-09-05 DIAGNOSIS — Z131 Encounter for screening for diabetes mellitus: Secondary | ICD-10-CM | POA: Diagnosis not present

## 2019-09-05 DIAGNOSIS — E785 Hyperlipidemia, unspecified: Secondary | ICD-10-CM | POA: Diagnosis not present

## 2019-09-06 LAB — CBC
HCT: 38.4 % (ref 35.0–45.0)
Hemoglobin: 12.7 g/dL (ref 11.7–15.5)
MCH: 31.8 pg (ref 27.0–33.0)
MCHC: 33.1 g/dL (ref 32.0–36.0)
MCV: 96 fL (ref 80.0–100.0)
MPV: 11.4 fL (ref 7.5–12.5)
Platelets: 212 10*3/uL (ref 140–400)
RBC: 4 10*6/uL (ref 3.80–5.10)
RDW: 12.3 % (ref 11.0–15.0)
WBC: 4 10*3/uL (ref 3.8–10.8)

## 2019-09-06 LAB — LIPID PANEL
Cholesterol: 174 mg/dL (ref <200)
HDL: 66 mg/dL (ref >=50)
LDL Cholesterol (Calc): 94 mg/dL (calc)
Non-HDL Cholesterol (Calc): 108 mg/dL (calc) (ref <130)
Total CHOL/HDL Ratio: 2.6 (calc) (ref <5.0)
Triglycerides: 46 mg/dL (ref <150)

## 2019-09-06 LAB — COMPLETE METABOLIC PANEL WITH GFR
AG Ratio: 1.5 (calc) (ref 1.0–2.5)
ALT: 19 U/L (ref 6–29)
AST: 16 U/L (ref 10–30)
Albumin: 4 g/dL (ref 3.6–5.1)
Alkaline phosphatase (APISO): 43 U/L (ref 31–125)
BUN: 13 mg/dL (ref 7–25)
CO2: 28 mmol/L (ref 20–32)
Calcium: 9.4 mg/dL (ref 8.6–10.2)
Chloride: 104 mmol/L (ref 98–110)
Creat: 0.81 mg/dL (ref 0.50–1.10)
GFR, Est African American: 106 mL/min/{1.73_m2} (ref >=60)
GFR, Est Non African American: 91 mL/min/{1.73_m2} (ref >=60)
Globulin: 2.7 g/dL (calc) (ref 1.9–3.7)
Glucose, Bld: 84 mg/dL (ref 65–99)
Potassium: 4.2 mmol/L (ref 3.5–5.3)
Sodium: 137 mmol/L (ref 135–146)
Total Bilirubin: 0.8 mg/dL (ref 0.2–1.2)
Total Protein: 6.7 g/dL (ref 6.1–8.1)

## 2019-09-06 LAB — HEMOGLOBIN A1C W/OUT EAG: Hgb A1c MFr Bld: 5.5 % of total Hgb (ref <5.7)

## 2019-09-06 LAB — TSH: TSH: 1.46 mIU/L

## 2019-09-06 LAB — VITAMIN D 25 HYDROXY (VIT D DEFICIENCY, FRACTURES): Vit D, 25-Hydroxy: 10 ng/mL — ABNORMAL LOW (ref 30–100)

## 2019-09-08 ENCOUNTER — Other Ambulatory Visit: Payer: Self-pay | Admitting: Family Medicine

## 2019-09-08 DIAGNOSIS — E559 Vitamin D deficiency, unspecified: Secondary | ICD-10-CM

## 2019-09-08 MED ORDER — VITAMIN D (ERGOCALCIFEROL) 1.25 MG (50000 UNIT) PO CAPS: 50000.0000 [IU] | ORAL_CAPSULE | ORAL | 1 refills | Status: DC

## 2019-09-10 ENCOUNTER — Emergency Department (HOSPITAL_COMMUNITY)
Admission: EM | Admit: 2019-09-10 | Discharge: 2019-09-10 | Discharge status: Against Medical Advice | Payer: Medicaid Other

## 2019-09-10 ENCOUNTER — Encounter: Payer: Self-pay | Admitting: Family Medicine

## 2019-09-10 DIAGNOSIS — R110 Nausea: Secondary | ICD-10-CM | POA: Diagnosis not present

## 2019-09-10 DIAGNOSIS — R42 Dizziness and giddiness: Secondary | ICD-10-CM | POA: Diagnosis not present

## 2019-09-10 DIAGNOSIS — Z20822 Contact with and (suspected) exposure to covid-19: Secondary | ICD-10-CM | POA: Diagnosis not present

## 2019-09-10 DIAGNOSIS — R05 Cough: Secondary | ICD-10-CM | POA: Diagnosis not present

## 2019-09-16 ENCOUNTER — Ambulatory Visit: Payer: Medicaid Other | Admitting: Orthopedic Surgery

## 2019-09-20 ENCOUNTER — Ambulatory Visit
Admission: EM | Admit: 2019-09-20 | Discharge: 2019-09-20 | Disposition: A | Discharge status: Home / Self Care | Payer: Medicaid Other | Attending: Emergency Medicine | Admitting: Emergency Medicine

## 2019-09-20 ENCOUNTER — Other Ambulatory Visit: Payer: Self-pay

## 2019-09-20 DIAGNOSIS — Z1152 Encounter for screening for COVID-19: Secondary | ICD-10-CM

## 2019-09-20 NOTE — ED Triage Notes (Signed)
Pt had covid exposure has mild sore throat , only wants testing

## 2019-09-21 ENCOUNTER — Encounter: Payer: Self-pay | Admitting: Orthopedic Surgery

## 2019-09-21 LAB — NOVEL CORONAVIRUS, NAA: SARS-CoV-2, NAA: NOT DETECTED

## 2019-09-24 ENCOUNTER — Other Ambulatory Visit: Payer: Self-pay

## 2019-09-24 ENCOUNTER — Encounter: Payer: Self-pay | Admitting: Emergency Medicine

## 2019-09-24 ENCOUNTER — Ambulatory Visit
Admission: EM | Admit: 2019-09-24 | Discharge: 2019-09-24 | Disposition: A | Discharge status: Home / Self Care | Payer: Medicaid Other | Attending: Emergency Medicine | Admitting: Emergency Medicine

## 2019-09-24 DIAGNOSIS — J069 Acute upper respiratory infection, unspecified: Secondary | ICD-10-CM | POA: Diagnosis not present

## 2019-09-24 DIAGNOSIS — Z20822 Contact with and (suspected) exposure to covid-19: Secondary | ICD-10-CM

## 2019-09-24 MED ORDER — BENZONATATE 100 MG PO CAPS: 100.0000 mg | ORAL_CAPSULE | Freq: Three times a day (TID) | ORAL | 0 refills | Status: DC

## 2019-09-24 MED ORDER — PREDNISONE 20 MG PO TABS: 20.0000 mg | ORAL_TABLET | Freq: Two times a day (BID) | ORAL | 0 refills | Status: AC

## 2019-09-24 MED ORDER — ALBUTEROL SULFATE HFA 108 (90 BASE) MCG/ACT IN AERS
1.0000 | INHALATION_SPRAY | Freq: Four times a day (QID) | RESPIRATORY_TRACT | 0 refills | Status: DC | PRN
Start: 2019-09-24 — End: 2020-08-14

## 2019-09-24 NOTE — ED Provider Notes (Signed)
[facility]   [id number] 09/24/19 Arrival Time: 1201   CC: COVID symptoms  SUBJECTIVE: History from: patient.  Marissa [name] is a 39 y.o. female who presents with sore throat, runny nose, productive cough with clear sputum x 5 days.  Coworker tested positive for COVID.  Has tried OTC medications without relief.  Symptoms are made worse at night.  Denies previous COVID infection.  Had COVID vaccines.   Denies fever, chills, SOB, wheezing, chest pain, nausea, changes in bowel or bladder habits.    Tested for COVID few days ago.  Was negative.  Had sore throat.  Cough is new.    ROS: As per HPI.  All other pertinent ROS negative.     Past Medical History:  Diagnosis Date  . Allergy    seasonal  . Anxiety   . Cerebrovascular accident (CVA) due to embolism (HCC) 10/08/2015   R  frontal  . Common migraine with intractable migraine 11/08/2015  . Encounter for gynecological examination with Papanicolaou smear of cervix 12/30/2018  . Hyperlipidemia   . Stroke (HCC)   . Stroke (HCC)   . Vitamin D deficiency    Past Surgical History:  Procedure Laterality Date  . CESAREAN SECTION    . 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 TUBAL LIGATION     Allergies  Allergen Reactions  . Triptans     Cannot use triptans for migraine per neurology due to stroke history  . Morphine And Related Hives    Feels like skin is burning    No current facility-administered medications on file prior to encounter.   Current Outpatient Medications on File Prior to Encounter  Medication Sig Dispense Refill  . aspirin 81 MG chewable tablet Chew 81 mg by mouth once as needed.    Marland Kitchen ibuprofen (ADVIL,MOTRIN) 600 MG tablet Take 1 tablet (600 mg total) by mouth every 6 (six) hours as needed. 30 tablet 0  . Vitamin D, Ergocalciferol, (DRISDOL) 1.25 MG (50000 UNIT) CAPS capsule Take 1 capsule (50,000 Units total) by mouth every 7 (seven) days. 12 capsule 1   Social History   Socioeconomic  History  . Marital status: Married    Spouse name: Christiane Ha  . Number of children: 3  . Years of education: 58  . Highest education level: Not on file  Occupational History  . Occupation: Bakery assoc    Comment: [employer]  Tobacco Use  . Smoking status: Never Smoker  . Smokeless tobacco: Never Used  Substance and Sexual Activity  . Alcohol use: No  . Drug use: No  . Sexual activity: Yes    Partners: Male    Birth control/protection: Surgical    Comment: tubal  Other Topics Concern  . Not on file  Social History Narrative   Lives with husband and 3 children   Right-handed   Caffeine: rare   Social Determinants of Health   Financial Resource Strain:   . Difficulty of Paying Living Expenses: Not on file  Food Insecurity:   . Worried About Programme researcher, broadcasting/film/video in the Last Year: Not on file  . Ran Out of Food in the Last Year: Not on file  Transportation Needs:   . Lack of Transportation (Medical): Not on file  . Lack of Transportation (Non-Medical): Not on file  Physical Activity:   . Days of Exercise per Week: Not on file  . Minutes of Exercise per Session: Not on file  Stress:   . Feeling of  Stress : Not on file  Social Connections:   . Frequency of Communication with Friends and Family: Not on file  . Frequency of Social Gatherings with Friends and Family: Not on file  . Attends Religious Services: Not on file  . Active Member of Clubs or Organizations: Not on file  . Attends Banker Meetings: Not on file  . Marital Status: Not on file  Intimate Partner Violence:   . Fear of Current or Ex-Partner: Not on file  . Emotionally Abused: Not on file  . Physically Abused: Not on file  . Sexually Abused: Not on file   Family History  Problem Relation Age of Onset  . Hypertension Mother   . Heart disease Maternal Uncle        murmur  . Heart disease Maternal Grandmother   . COPD Maternal Grandfather   . Cancer Maternal Grandfather        lung  . Heart  disease Maternal Aunt        murmur  . Migraines Neg Hx     OBJECTIVE:  Vitals:   09/24/19 1255  BP: (!) 141/106  Pulse: 99  Resp: 16  Temp: 98.1 F (36.7 C)  TempSrc: Tympanic  SpO2: 98%     General appearance: alert; appears fatigued, but nontoxic; speaking in full sentences and tolerating own secretions HEENT: NCAT; Ears: EACs clear, TMs pearly gray; Eyes: PERRL.  EOM grossly intact. Nose: nares patent without rhinorrhea, Throat: oropharynx clear, tonsils non erythematous or enlarged, uvula midline  Neck: supple without LAD Lungs: unlabored respirations, symmetrical air entry; cough: mild; no respiratory distress; CTAB; decreased breath sounds throughout Heart: regular rate and rhythm.   Skin: warm and dry Psychological: alert and cooperative; normal mood and affect  ASSESSMENT & PLAN:  1. Viral URI with cough   2. Suspected COVID-19 virus infection     Meds ordered this encounter  Medications  . predniSONE (DELTASONE) 20 MG tablet    Sig: Take 1 tablet (20 mg total) by mouth 2 (two) times daily with a meal for 5 days.    Dispense:  10 tablet    Refill:  0    Order Specific Question:   Supervising Provider    Answer:   Eustace Moore [[id number]]  . albuterol (VENTOLIN HFA) 108 (90 Base) MCG/ACT inhaler    Sig: Inhale 1-2 puffs into the lungs every 6 (six) hours as needed for wheezing or shortness of breath.    Dispense:  18 g    Refill:  0    Order Specific Question:   Supervising Provider    Answer:   Eustace Moore [[id number]]  . benzonatate (TESSALON) 100 MG capsule    Sig: Take 1 capsule (100 mg total) by mouth every 8 (eight) hours.    Dispense:  21 capsule    Refill:  0    Order Specific Question:   Supervising Provider    Answer:   Eustace Moore [[id number]]   COVID testing ordered.  It will take between 5-7 days for test results.  Someone will contact you regarding abnormal results.    In the meantime: You should remain isolated in your home  for 10 days from symptom onset AND greater than 72 hours after symptoms resolution (absence of fever without the use of fever-reducing medication and improvement in respiratory symptoms), whichever is longer Get plenty of rest and push fluids Albuterol inhaler prescribed.  Use as directed for cough Prednisone prescribed.  Take  as directed and to completion Tessalon Perles prescribed for cough Use OTC zyrtec for nasal congestion, runny nose, and/or sore throat Use OTC flonase for nasal congestion and runny nose Use medications daily for symptom relief Use OTC medications like ibuprofen or tylenol as needed fever or pain Call or go to the ED if you have any new or worsening symptoms such as fever, worsening cough, shortness of breath, chest tightness, chest pain, turning blue, changes in mental status, etc...   Reviewed expectations re: course of current medical issues. Questions answered. Outlined signs and symptoms indicating need for more acute intervention. Patient verbalized understanding. After Visit Summary given.         Rennis Harding, PA-C 09/24/19 1326

## 2019-09-24 NOTE — Discharge Instructions (Addendum)
COVID testing ordered.  It will take between 5-7 days for test results.  Someone will contact you regarding abnormal results.    In the meantime: You should remain isolated in your home for 10 days from symptom onset AND greater than 72 hours after symptoms resolution (absence of fever without the use of fever-reducing medication and improvement in respiratory symptoms), whichever is longer Get plenty of rest and push fluids Albuterol inhaler prescribed.  Use as directed for cough Prednisone prescribed.  Take as directed and to completion Tessalon Perles prescribed for cough Use OTC zyrtec for nasal congestion, runny nose, and/or sore throat Use OTC flonase for nasal congestion and runny nose Use medications daily for symptom relief Use OTC medications like ibuprofen or tylenol as needed fever or pain Call or go to the ED if you have any new or worsening symptoms such as fever, worsening cough, shortness of breath, chest tightness, chest pain, turning blue, changes in mental status, etc..Marland Kitchen

## 2019-09-25 LAB — NOVEL CORONAVIRUS, NAA: SARS-CoV-2, NAA: NOT DETECTED

## 2019-10-09 DIAGNOSIS — H5213 Myopia, bilateral: Secondary | ICD-10-CM | POA: Diagnosis not present

## 2019-12-13 ENCOUNTER — Other Ambulatory Visit: Payer: Self-pay

## 2019-12-13 ENCOUNTER — Encounter: Payer: Self-pay | Admitting: Family Medicine

## 2019-12-13 ENCOUNTER — Telehealth: Payer: Medicaid Other | Admitting: Family Medicine

## 2019-12-13 VITALS — Ht 62.0 in | Wt 175.0 lb

## 2019-12-13 DIAGNOSIS — M7989 Other specified soft tissue disorders: Secondary | ICD-10-CM | POA: Insufficient documentation

## 2019-12-13 DIAGNOSIS — R200 Anesthesia of skin: Secondary | ICD-10-CM | POA: Diagnosis not present

## 2019-12-13 DIAGNOSIS — F419 Anxiety disorder, unspecified: Secondary | ICD-10-CM | POA: Diagnosis not present

## 2019-12-13 NOTE — Assessment & Plan Note (Signed)
Swelling of hands- seems salt intake related. Encouraged diet change and increase in water.

## 2019-12-13 NOTE — Progress Notes (Signed)
Virtual Visit via Telephone Note   This visit type was conducted due to national recommendations for restrictions regarding the COVID-19 Pandemic (e.g. social distancing) in an effort to limit this patient's exposure and mitigate transmission in our community.  Due to her co-morbid illnesses, this patient is at least at moderate risk for complications without adequate follow up.  This format is felt to be most appropriate for this patient at this time.  The patient did not have access to video technology/had technical difficulties with video requiring transitioning to audio format only (telephone).  All issues noted in this document were discussed and addressed.  No physical exam could be performed with this format.    Evaluation Performed:  Follow-up visit  Date:  12/13/2019   ID:  Marissa [name], DOB 1980-08-27, MRN [id number]  Participants: Nurse, patient, provider  Patient Location: Home Provider Location: Office/Clinic  Location of Patient: Home Location of Provider: Telehealth Consent was obtain for visit to be over via telehealth. I verified that I am speaking with the correct person using two identifiers.  PCP:  Freddy Finner, NP   Chief Complaint:  anxiety   History of Present Illness:    Marissa [name] is a 39 y.o. female with history as stated below.  Reports today that her anxiety has increased.  And though she does not want to be on medicine for this she is hoping that pet therapy could be an option.  She is looking at current therapy dogs that could possibly help her handled this appears to be related to hormones and cycles.  She has not had any anxiety attacks recently and is hoping that having a pet would help prevent them in the future.  She reports that she is also have some hand and wrist tightness her rings are tight and her watches are tight.  But she does eat a lot of salt foods and canned, blocks, process, fast foods.  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  She reports a little bit of numbness mostly during the day not at night.  She reports is intermittent in nature and not continuous.  She would like to try a brace to see if this is beneficial in addition to salt reduction as well.  The patient does not have symptoms concerning for COVID-19 infection (fever, chills, cough, or new shortness of breath).   Past Medical, Surgical, Social History, Allergies, and Medications have been Reviewed.  Past Medical History:  Diagnosis Date  . Allergy    seasonal  . Anxiety   . Cerebrovascular accident (CVA) due to embolism (HCC) 10/08/2015   R  frontal  . Common migraine with intractable migraine 11/08/2015  . Encounter for gynecological examination with Papanicolaou smear of cervix 12/30/2018  . Hyperlipidemia   . Stroke (HCC)   . Stroke (HCC)   . Vitamin D deficiency    Past Surgical History:  Procedure Laterality Date  . CESAREAN SECTION    . 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 TUBAL LIGATION       Current Meds  Medication Sig  . albuterol (VENTOLIN HFA) 108 (90 Base) MCG/ACT inhaler Inhale 1-2 puffs into the lungs every 6 (six) hours as needed for wheezing or shortness of breath.  Marland Kitchen aspirin 81 MG chewable tablet Chew 81 mg by mouth once as needed.  . benzonatate (TESSALON) 100 MG capsule Take 1 capsule (100 mg total) by mouth  every 8 (eight) hours.  Marland Kitchen ibuprofen (ADVIL,MOTRIN) 600 MG tablet Take 1 tablet (600 mg total) by mouth every 6 (six) hours as needed.  . Vitamin D, Ergocalciferol, (DRISDOL) 1.25 MG (50000 UNIT) CAPS capsule Take 1 capsule (50,000 Units total) by mouth every 7 (seven) days.     Allergies:   Triptans and Morphine and related   ROS:   Please see the history of present illness.    All other systems reviewed and are negative.   Labs/Other Tests and Data Reviewed:    Recent Labs: 09/05/2019: ALT 19; BUN 13; Creat 0.81; Hemoglobin 12.7; Platelets 212; Potassium  4.2; Sodium 137; TSH 1.46   Recent Lipid Panel Lab Results  Component Value Date/Time   CHOL 174 09/05/2019 11:54 AM   TRIG 46 09/05/2019 11:54 AM   HDL 66 09/05/2019 11:54 AM   CHOLHDL 2.6 09/05/2019 11:54 AM   LDLCALC 94 09/05/2019 11:54 AM    Wt Readings from Last 3 Encounters:  12/13/19 175 lb (79.4 kg)  08/30/19 179 lb 12.8 oz (81.6 kg)  12/30/18 180 lb (81.6 kg)     Objective:    Vital Signs:  Ht 5\' 2"  (1.575 m)   Wt 175 lb (79.4 kg)   LMP 11/29/2019   BMI 32.01 kg/m    VITAL SIGNS:  reviewed GEN:  no acute distress RESPIRATORY:  no shortness of breath in conversation  PSYCH:  normal affect  ASSESSMENT & PLAN:    1. Anxiety  2. Bilateral hand numbness  3. Swelling of both hands    Time:   Today, I have spent 7 minutes with the patient with telehealth technology discussing the above problems.     Medication Adjustments/Labs and Tests Ordered: Current medicines are reviewed at length with the patient today.  Concerns regarding medicines are outlined above.   Tests Ordered: No orders of the defined types were placed in this encounter.   Medication Changes: No orders of the defined types were placed in this encounter.    Note: This dictation was prepared with Dragon dictation along with smaller phrase technology. Similar sounding words can be transcribed inadequately or may not be corrected upon review. Any transcriptional errors that result from this process are unintentional.      Disposition:  Follow up as needed Signed, 13/09/2019, NP  12/13/2019 4:09 PM     12/15/2019 [facility]

## 2019-12-13 NOTE — Assessment & Plan Note (Signed)
She is advised to fill out a form and bring to the office she is advised of the fee that we have.  And we will get her set up for pet therapy so that she can have her pet at her home.  She still declines wanting any medication at this time.  And thinks that a PET would be very beneficial for her.

## 2019-12-13 NOTE — Patient Instructions (Signed)
  HAPPY FALL!  I appreciate the opportunity to provide you with care for your health and wellness. Today we discussed: anxiety and hand swelling and numbness  Follow up: as needed  No labs or referrals today  Form to be dropped off for pet therapy at front desk  Wear brace for work and at night Avoid each salt in diet and increase water intake.  Please continue to practice social distancing to keep you, your family, and our community safe.  If you must go out, please wear a mask and practice good handwashing.  It was a pleasure to see you and I look forward to continuing to work together on your health and well-being. Please do not hesitate to call the office if you need care or have questions about your care.  Have a wonderful day and week. With Gratitude, Tereasa Coop, DNP, AGNP-BC

## 2019-12-13 NOTE — Assessment & Plan Note (Signed)
Most likely overuse. RICE measures and brace encouraged.

## 2020-01-29 ENCOUNTER — Other Ambulatory Visit: Payer: Self-pay

## 2020-01-29 ENCOUNTER — Encounter: Payer: Self-pay | Admitting: Emergency Medicine

## 2020-01-29 ENCOUNTER — Ambulatory Visit
Admission: EM | Admit: 2020-01-29 | Discharge: 2020-01-29 | Disposition: A | Discharge status: Home / Self Care | Payer: Medicaid Other | Attending: Family Medicine | Admitting: Family Medicine

## 2020-01-29 DIAGNOSIS — R519 Headache, unspecified: Secondary | ICD-10-CM

## 2020-01-29 DIAGNOSIS — R509 Fever, unspecified: Secondary | ICD-10-CM

## 2020-01-29 DIAGNOSIS — R059 Cough, unspecified: Secondary | ICD-10-CM

## 2020-01-29 DIAGNOSIS — R52 Pain, unspecified: Secondary | ICD-10-CM

## 2020-01-29 DIAGNOSIS — R6883 Chills (without fever): Secondary | ICD-10-CM | POA: Diagnosis not present

## 2020-01-29 DIAGNOSIS — B349 Viral infection, unspecified: Secondary | ICD-10-CM | POA: Diagnosis not present

## 2020-01-29 NOTE — Discharge Instructions (Signed)
Your COVID and Flu tests are pending.  You should self quarantine until the test results are back.    Take Tylenol or ibuprofen as needed for fever or discomfort.  Rest and keep yourself hydrated.    Follow-up with your primary care provider if your symptoms are not improving.     

## 2020-01-29 NOTE — ED Provider Notes (Signed)
[facility]   [id number] 01/29/20 Arrival Time: 0825   CC: COVID symptoms  SUBJECTIVE: History from: patient.  Marissa [name] is a 40 y.o. female who presents with fever, body aches, chills, headache since yesterday. Denies sick exposure to COVID, flu or strep. Denies recent travel. Has negative history of Covid. Has completed Covid vaccines. Has not had booster or flu vaccines this year. Has taken OTC medications for this with little benefit. There are no aggravating or alleviating factors. Denies previous symptoms in the past. Denies sinus pain, rhinorrhea, sore throat, SOB, wheezing, chest pain, nausea, changes in bowel or bladder habits.    ROS: As per HPI.  All other pertinent ROS negative.     Past Medical History:  Diagnosis Date  . Allergy    seasonal  . Anxiety   . Cerebrovascular accident (CVA) due to embolism (HCC) 10/08/2015   R  frontal  . Common migraine with intractable migraine 11/08/2015  . Encounter for gynecological examination with Papanicolaou smear of cervix 12/30/2018  . Hyperlipidemia   . Stroke (HCC)   . Stroke (HCC)   . Vitamin D deficiency    Past Surgical History:  Procedure Laterality Date  . CESAREAN SECTION    . 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 TUBAL LIGATION     Allergies  Allergen Reactions  . Triptans     Cannot use triptans for migraine per neurology due to stroke history  . Morphine And Related Hives    Feels like skin is burning    No current facility-administered medications on file prior to encounter.   Current Outpatient Medications on File Prior to Encounter  Medication Sig Dispense Refill  . albuterol (VENTOLIN HFA) 108 (90 Base) MCG/ACT inhaler Inhale 1-2 puffs into the lungs every 6 (six) hours as needed for wheezing or shortness of breath. 18 g 0  . aspirin 81 MG chewable tablet Chew 81 mg by mouth once as needed.    . benzonatate (TESSALON) 100 MG capsule Take 1 capsule (100 mg total) by mouth every 8  (eight) hours. 21 capsule 0  . ibuprofen (ADVIL,MOTRIN) 600 MG tablet Take 1 tablet (600 mg total) by mouth every 6 (six) hours as needed. 30 tablet 0  . Vitamin D, Ergocalciferol, (DRISDOL) 1.25 MG (50000 UNIT) CAPS capsule Take 1 capsule (50,000 Units total) by mouth every 7 (seven) days. 12 capsule 1   Social History   Socioeconomic History  . Marital status: Married    Spouse name: Christiane Ha  . Number of children: 3  . Years of education: 24  . Highest education level: Not on file  Occupational History  . Occupation: Bakery assoc    Comment: [employer]  Tobacco Use  . Smoking status: Never Smoker  . Smokeless tobacco: Never Used  Substance and Sexual Activity  . Alcohol use: No  . Drug use: No  . Sexual activity: Yes    Partners: Male    Birth control/protection: Surgical    Comment: tubal  Other Topics Concern  . Not on file  Social History Narrative   Lives with husband and 3 children   Right-handed   Caffeine: rare   Social Determinants of Health   Financial Resource Strain: Not on file  Food Insecurity: Not on file  Transportation Needs: Not on file  Physical Activity: Not on file  Stress: Not on file  Social Connections: Not on file  Intimate Partner Violence: Not on file   Family History  Problem  Relation Age of Onset  . Hypertension Mother   . Heart disease Maternal Uncle        murmur  . Heart disease Maternal Grandmother   . COPD Maternal Grandfather   . Cancer Maternal Grandfather        lung  . Heart disease Maternal Aunt        murmur  . Migraines Neg Hx     OBJECTIVE:  Vitals:   01/29/20 0902 01/29/20 0905  BP: 135/86   Pulse: 93   Resp: 16   Temp: 99.8 F (37.7 C)   TempSrc: Oral   SpO2: 96%   Weight:  170 lb (77.1 kg)  Height:  5\' 2"  (1.575 m)     General appearance: alert; appears fatigued, but nontoxic; speaking in full sentences and tolerating own secretions HEENT: NCAT; Ears: EACs clear, TMs pearly gray; Eyes: PERRL.  EOM  grossly intact. Sinuses: nontender; Nose: nares patent with clear rhinorrhea, Throat: oropharynx erythematous, cobblestoning present, tonsils non erythematous or enlarged, uvula midline  Neck: supple without LAD Lungs: unlabored respirations, symmetrical air entry; cough: absent; no respiratory distress; CTAB Heart: regular rate and rhythm.  Radial pulses 2+ symmetrical bilaterally Skin: warm and dry Psychological: alert and cooperative; normal mood and affect  LABS:  No results found for this or any previous visit (from the past 24 hour(s)).   ASSESSMENT & PLAN:  1. Viral illness   2. Chills   3. Fever, unspecified fever cause   4. Cough   5. Body aches   6. Nonintractable headache, unspecified chronicity pattern, unspecified headache type     Continue supportive care at home COVID and flu testing ordered.  It will take between 2-3 days for test results. Someone will contact you regarding abnormal results.   Work note provided Patient should remain in quarantine until they have received Covid results.  If negative you may resume normal activities (go back to work/school) while practicing hand hygiene, social distance, and mask wearing.  If positive, patient should remain in quarantine for at least 5 days from symptom onset AND greater than 72 hours after symptoms resolution (absence of fever without the use of fever-reducing medication and improvement in respiratory symptoms), whichever is longer Get plenty of rest and push fluids Use OTC zyrtec for nasal congestion, runny nose, and/or sore throat Use OTC flonase for nasal congestion and runny nose Use medications daily for symptom relief Use OTC medications like ibuprofen or tylenol as needed fever or pain Call or go to the ED if you have any new or worsening symptoms such as fever, worsening cough, shortness of breath, chest tightness, chest pain, turning blue, changes in mental status.  Reviewed expectations re: course of current  medical issues. Questions answered. Outlined signs and symptoms indicating need for more acute intervention. Patient verbalized understanding. After Visit Summary given.         , NP 01/29/20 [phone]

## 2020-01-29 NOTE — ED Triage Notes (Signed)
C/o headache, fever, chills, body aches since yesterday.

## 2020-01-30 ENCOUNTER — Telehealth: Payer: Self-pay

## 2020-01-30 NOTE — Telephone Encounter (Signed)
Pt lvm that she was seen at the Urgent Care , and waiting for results from Lab regarding Covid and Flu   Advised pt once the test come back, if the Urgent Care does not take her out of work, we will need to complete a visit, and we can take her out if needed.

## 2020-02-01 ENCOUNTER — Telehealth: Payer: Self-pay

## 2020-02-01 LAB — COVID-19, FLU A+B NAA
Influenza A, NAA: NOT DETECTED
Influenza B, NAA: NOT DETECTED
SARS-CoV-2, NAA: DETECTED — AB

## 2020-02-01 NOTE — Telephone Encounter (Signed)
Pt called stating that she started feeling bad on Sunday, she went to Urgent Care and tested positive. 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 She says there is no way she could go to work, she feels horrible. Her husband is also sick and her children. She is scheduled to work this Friday and Saturday, off on Sunday. She wanted to know if there was anyway she could get an oow from now until Monday. Please advise.

## 2020-02-02 ENCOUNTER — Telehealth (INDEPENDENT_AMBULATORY_CARE_PROVIDER_SITE_OTHER): Payer: Medicaid Other | Admitting: Family Medicine

## 2020-02-02 ENCOUNTER — Encounter: Payer: Self-pay | Admitting: Family Medicine

## 2020-02-02 ENCOUNTER — Other Ambulatory Visit: Payer: Self-pay

## 2020-02-02 VITALS — BP 135/86 | Wt 170.0 lb

## 2020-02-02 DIAGNOSIS — U071 COVID-19: Secondary | ICD-10-CM | POA: Diagnosis not present

## 2020-02-02 DIAGNOSIS — R059 Cough, unspecified: Secondary | ICD-10-CM

## 2020-02-02 MED ORDER — PROMETHAZINE-DM 6.25-15 MG/5ML PO SYRP: 2.5000 mL | ORAL_SOLUTION | Freq: Two times a day (BID) | ORAL | 0 refills | Status: DC | PRN

## 2020-02-02 NOTE — Patient Instructions (Signed)
  I appreciate the opportunity to provide you with care for your health and wellness.  Follow up: As needed  No labs or referrals today  Work note from January 9 to return on January 24  Please quarantine as directed by the urgent care. If your symptoms worsen or you are unable to get fluid down please proceed to your nearest emergency room to prevent dehydration.  Please continue to practice social distancing to keep you, your family, and our community safe.  If you must go out, please wear a mask and practice good handwashing.  It was a pleasure to see you and I look forward to continuing to work together on your health and well-being. Please do not hesitate to call the office if you need care or have questions about your care.  Have a wonderful day. With Gratitude, Tereasa Coop, DNP, AGNP-BC

## 2020-02-02 NOTE — Progress Notes (Signed)
Virtual Visit via Telephone Note   This visit type was conducted due to national recommendations for restrictions regarding the COVID-19 Pandemic (e.g. social distancing) in an effort to limit this patient's exposure and mitigate transmission in our community.  Due to her co-morbid illnesses, this patient is at least at moderate risk for complications without adequate follow up.  This format is felt to be most appropriate for this patient at this time.  The patient did not have access to video technology/had technical difficulties with video requiring transitioning to audio format only (telephone).  All issues noted in this document were discussed and addressed.  No physical exam could be performed with this format.   Evaluation Performed:  Follow-up visit  Date:  02/02/2020   ID:  Marissa [name], DOB 06-13-1980, MRN [id number]  Patient Location: Home Provider Location: Home Office   Participants: Nurse/CMA for intake and work up; Patient and Provider for Visit and Wrap up  Method of visit: Telephone Location of Patient: Home Location of Provider: Office Consent was obtain for visit over the telephone. Services rendered by provider: Visit was performed via telephone  I verified that I am speaking with the correct person using two identifiers.  PCP:  Freddy Finner, NP   Chief Complaint: Positive COVID  History of Present Illness:    Marissa [name] is a 40 y.o. female patient of mine who presents today after testing positive on January 9.  She reported to the urgent care with fever, body aches, chills and a headache that started on January 8. Denied sick exposure to COVID, flu or strep. Denied recent travel. Has negative history of Covid. Has completed Covid vaccines. Has not had booster or flu vaccines this year. Had taken OTC medications for this with little benefit.    Denies previous symptoms in the past. Denies sinus pain, rhinorrhea, sore throat, SOB, wheezing, chest  pain, nausea, changes in bowel or bladder habits.   The patient does not have symptoms concerning for COVID-19 infection (fever, chills, cough, or new shortness of breath).   Past Medical, Surgical, Social History, Allergies, and Medications have been Reviewed.  Past Medical History:  Diagnosis Date  . Allergy    seasonal  . Anxiety   . Cerebrovascular accident (CVA) due to embolism (HCC) 10/08/2015   R  frontal  . Common migraine with intractable migraine 11/08/2015  . Encounter for gynecological examination with Papanicolaou smear of cervix 12/30/2018  . Hyperlipidemia   . Stroke (HCC)   . Stroke (HCC)   . Vitamin D deficiency    Past Surgical History:  Procedure Laterality Date  . CESAREAN SECTION    . 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 TUBAL LIGATION       Current Meds  Medication Sig  . albuterol (VENTOLIN HFA) 108 (90 Base) MCG/ACT inhaler Inhale 1-2 puffs into the lungs every 6 (six) hours as needed for wheezing or shortness of breath.  Marland Kitchen aspirin 81 MG chewable tablet Chew 81 mg by mouth once as needed.  Marland Kitchen ibuprofen (ADVIL,MOTRIN) 600 MG tablet Take 1 tablet (600 mg total) by mouth every 6 (six) hours as needed.  . Vitamin D, Ergocalciferol, (DRISDOL) 1.25 MG (50000 UNIT) CAPS capsule Take 1 capsule (50,000 Units total) by mouth every 7 (seven) days.     Allergies:   Triptans and Morphine and related   ROS:   Please see the history of present illness.    All other systems reviewed and  are negative.   Labs/Other Tests and Data Reviewed:    Recent Labs: 09/05/2019: ALT 19; BUN 13; Creat 0.81; Hemoglobin 12.7; Platelets 212; Potassium 4.2; Sodium 137; TSH 1.46   Recent Lipid Panel Lab Results  Component Value Date/Time   CHOL 174 09/05/2019 11:54 AM   TRIG 46 09/05/2019 11:54 AM   HDL 66 09/05/2019 11:54 AM   CHOLHDL 2.6 09/05/2019 11:54 AM   LDLCALC 94 09/05/2019 11:54 AM    Wt Readings from Last 3 Encounters:  02/02/20 170 lb (77.1 kg)   01/29/20 170 lb (77.1 kg)  12/13/19 175 lb (79.4 kg)     Objective:    Vital Signs:  BP 135/86   Wt 170 lb (77.1 kg)   LMP 01/25/2020   BMI 31.09 kg/m    VITAL SIGNS:  reviewed GEN:  no acute distress RESPIRATORY:  No shortness of breath during conversation PSYCH:  normal affect  ASSESSMENT & PLAN:    1. COVID-19 virus detected  2. Cough   Time:   Today, I have spent 7 minutes with the patient with telehealth technology discussing the above problems.     Medication Adjustments/Labs and Tests Ordered: Current medicines are reviewed at length with the patient today.  Concerns regarding medicines are outlined above.   Tests Ordered: No orders of the defined types were placed in this encounter.   Medication Changes: No orders of the defined types were placed in this encounter.    Disposition:  Follow up Visit date not found Signed, Freddy Finner, NP  02/02/2020 3:13 PM     [facility]

## 2020-02-02 NOTE — Telephone Encounter (Signed)
Happy to give a work note- but I need the positive test result and a phone visit to review symptoms with her if she is able.

## 2020-02-02 NOTE — Assessment & Plan Note (Signed)
Reviewed extensive quarantine measures.  Cough medicine provided.  Advised for hydration.  Reports that she is unable to drink a lot of fluids right now.  I have encouraged her in multiple ways of how to get fluids in she is advised to go to the nearest emergency room if she is unable to keep fluids in or down.  She verbalized understanding.  Work note has been provided.

## 2020-02-02 NOTE — Telephone Encounter (Signed)
Pt made a virtual visit with Dahlia Client 02-02-20

## 2020-02-02 NOTE — Assessment & Plan Note (Signed)
Cough secondary to COVID-19 positive detection.  Symptom management.  Cough syrup provided.  Advised to go to the nearest emergency room should cough not improve or worsen or she develops shortness of breath.

## 2020-02-07 ENCOUNTER — Telehealth: Payer: Self-pay

## 2020-02-07 NOTE — Telephone Encounter (Signed)
Pt needs positive result note and oow note faxed to [phone]. Make sure the claim number is listed [id number]

## 2020-02-07 NOTE — Telephone Encounter (Signed)
I think we did this at her visit on 1/13. If not- please go ahead as per AVS dates for OOW note. Thank you

## 2020-02-07 NOTE — Telephone Encounter (Signed)
Pt called vm stating that she recently tested positive for Covid. She wanted to know if she could have an oow note until 02/10/20. Please advise.

## 2020-02-09 NOTE — Telephone Encounter (Signed)
Done. Pt informed.

## 2020-03-27 ENCOUNTER — Telehealth: Payer: Self-pay | Admitting: Family Medicine

## 2020-03-27 NOTE — Telephone Encounter (Signed)
I reached out to Marissa [name] today to get her scheduled for a phone visit with the Managed Medicaid Team and she declined our services.

## 2020-06-28 ENCOUNTER — Other Ambulatory Visit: Payer: Self-pay

## 2020-06-28 ENCOUNTER — Ambulatory Visit
Admission: EM | Admit: 2020-06-28 | Discharge: 2020-06-28 | Disposition: A | Discharge status: Home / Self Care | Payer: Medicaid Other | Attending: Internal Medicine | Admitting: Internal Medicine

## 2020-06-28 DIAGNOSIS — J040 Acute laryngitis: Secondary | ICD-10-CM

## 2020-06-28 MED ORDER — BENZONATATE 100 MG PO CAPS: 100.0000 mg | ORAL_CAPSULE | Freq: Three times a day (TID) | ORAL | 0 refills | Status: DC | PRN

## 2020-06-28 MED ORDER — PREDNISONE 20 MG PO TABS: 20.0000 mg | ORAL_TABLET | Freq: Every day | ORAL | 0 refills | Status: AC

## 2020-06-28 NOTE — Discharge Instructions (Addendum)
Please take medications as directed If symptoms worsen please return to urgent care for further evaluation.

## 2020-06-28 NOTE — ED Triage Notes (Signed)
Pt presents with cough  that developed last week

## 2020-07-01 NOTE — ED Provider Notes (Signed)
[facility]    CSN: [id number] Arrival date & time: 06/28/20  1026      History   Chief Complaint No chief complaint on file.   HPI Marissa [name] is a 40 y.o. female comes to the urgent care with complaints of a cough and hoarseness of her voice over the past week.  Symptoms started insidiously and has been persistent.  She endorses change in her voice at the outset of the symptoms.  She denies any noisy breathing, difficulty swallowing, shortness of breath or wheezing.  No nausea, vomiting or diarrhea.  She denies any sick contacts.  Patient is fully vaccinated against COVID-19 virus.  She has not received a booster vaccine.  Patient does not smoke.  HPI  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) 10/08/2015   R  frontal   Common migraine with intractable migraine 11/08/2015   Encounter for gynecological examination with Papanicolaou smear of cervix 12/30/2018   Hyperlipidemia    Stroke [facility])    Stroke [facility])    Vitamin D deficiency     Patient Active Problem List   Diagnosis Date Noted   Cough 02/02/2020   COVID-19 virus detected 02/02/2020   Bilateral hand numbness 12/13/2019   Swelling of both hands 12/13/2019   Annual visit for general adult medical examination with abnormal findings 08/30/2019   History of CVA (cerebrovascular accident) 12/09/2018   Anxiety 12/09/2018   Obesity (BMI 30.0-34.9) 12/09/2018   Common migraine with intractable migraine 11/08/2015   PFO (patent foramen ovale) 10/08/2015   HLD (hyperlipidemia) 10/08/2015   Vitamin D deficiency 10/08/2015    Past Surgical History:  Procedure Laterality Date   CESAREAN SECTION     PATENT FORAMEN OVALE CLOSURE  09/25/2015   TUBAL LIGATION      OB History     Gravida  3   Para  3   Term  2   Preterm  1   AB      Living  3      SAB      IAB      Ectopic      Multiple      Live  Births               Home Medications    Prior to Admission medications   Medication Sig Start Date End Date Taking? Authorizing Provider  benzonatate (TESSALON) 100 MG capsule Take 1 capsule (100 mg total) by mouth 3 (three) times daily as needed for cough. 06/28/20  Yes Amanii Snethen, Britta Mccreedy, MD  predniSONE (DELTASONE) 20 MG tablet Take 1 tablet (20 mg total) by mouth daily for 5 days. 06/28/20 07/03/20 Yes Azhar Yogi, Britta Mccreedy, MD  albuterol (VENTOLIN HFA) 108 (90 Base) MCG/ACT inhaler Inhale 1-2 puffs into the lungs every 6 (six) hours as needed for wheezing or shortness of breath. 09/24/19   Wurst, [place], PA-C  aspirin 81 MG chewable tablet Chew 81 mg by mouth once as needed.    [provider]  ibuprofen (ADVIL,MOTRIN) 600 MG tablet Take 1 tablet (600 mg total) by mouth every 6 (six) hours as needed. 09/11/17   Eber Hong, MD  promethazine-dextromethorphan (PROMETHAZINE-DM) 6.25-15 MG/5ML syrup Take 2.5 mLs by mouth 2 (two) times daily as needed for cough. 02/02/20   Freddy Finner, NP  Vitamin D, Ergocalciferol, (DRISDOL) 1.25 MG (50000 UNIT) CAPS capsule Take 1  capsule (50,000 Units total) by mouth every 7 (seven) days. 09/08/19   Freddy Finner, NP    Family History Family History  Problem Relation Age of Onset   Hypertension Mother    Heart disease Maternal Uncle        murmur   Heart disease Maternal Grandmother    COPD Maternal Grandfather    Cancer Maternal Grandfather        lung   Heart disease Maternal Aunt        murmur   Migraines Neg Hx     Social History Social History   Tobacco Use   Smoking status: Never   Smokeless tobacco: Never  Substance Use Topics   Alcohol use: No   Drug use: No     Allergies   Triptans and Morphine and related   Review of Systems Review of Systems  Constitutional: Negative.   HENT:  Positive for voice change. Negative for congestion, rhinorrhea and sore throat.   Respiratory: Negative.    Cardiovascular: Negative.    Gastrointestinal: Negative.     Physical Exam Triage Vital Signs ED Triage Vitals  Enc Vitals Group     BP 06/28/20 1110 121/78     Pulse Rate 06/28/20 1110 99     Resp 06/28/20 1110 20     Temp 06/28/20 1110 98.8 F (37.1 C)     Temp src --      SpO2 06/28/20 1110 98 %     Weight --      Height --      Head Circumference --      Peak Flow --      Pain Score 06/28/20 1112 0     Pain Loc --      Pain Edu? --      Excl. in GC? --    No data found.  Updated Vital Signs BP 121/78   Pulse 99   Temp 98.8 F (37.1 C)   Resp 20   SpO2 98%   Visual Acuity Right Eye Distance:   Left Eye Distance:   Bilateral Distance:    Right Eye Near:   Left Eye Near:    Bilateral Near:     Physical Exam Vitals and nursing note reviewed.  Constitutional:      Appearance: Normal appearance. She is not ill-appearing.  HENT:     Right Ear: Tympanic membrane normal.     Left Ear: Tympanic membrane normal.     Mouth/Throat:     Mouth: Mucous membranes are moist.     Pharynx: No oropharyngeal exudate or posterior oropharyngeal erythema.  Cardiovascular:     Rate and Rhythm: Normal rate and regular rhythm.     Pulses: Normal pulses.     Heart sounds: Normal heart sounds.  Neurological:     Mental Status: She is alert.     UC Treatments / Results  Labs (all labs ordered are listed, but only abnormal results are displayed) Labs Reviewed - No data to display  EKG   Radiology No results found.  Procedures Procedures (including critical care time)  Medications Ordered in UC Medications - No data to display  Initial Impression / Assessment and Plan / UC Course  I have reviewed the triage vital signs and the nursing notes.  Pertinent labs & imaging results that were available during my care of the patient were reviewed by me and considered in my medical decision making (see chart for details).     1.  Acute laryngitis likely viral: Warm salt water gargle Tessalon  Perles as needed for cough Prednisone 20 mg orally daily for 5 days Increase oral fluid intake Return to urgent care if you have difficulty breathing, noisy breathing or fevers. Final Clinical Impressions(s) / UC Diagnoses   Final diagnoses:  Acute laryngitis     Discharge Instructions      Please take medications as directed If symptoms worsen please return to urgent care for further evaluation.   ED Prescriptions     Medication Sig Dispense Auth. Provider   benzonatate (TESSALON) 100 MG capsule Take 1 capsule (100 mg total) by mouth 3 (three) times daily as needed for cough. 21 capsule General Wearing, Britta Mccreedy, MD   predniSONE (DELTASONE) 20 MG tablet Take 1 tablet (20 mg total) by mouth daily for 5 days. 5 tablet Arieon Corcoran, Britta Mccreedy, MD      PDMP not reviewed this encounter.   Merrilee Jansky, MD 07/01/20 [phone]

## 2020-08-12 ENCOUNTER — Encounter (HOSPITAL_COMMUNITY): Payer: Self-pay | Admitting: Emergency Medicine

## 2020-08-12 ENCOUNTER — Emergency Department (HOSPITAL_COMMUNITY): Payer: Medicaid Other

## 2020-08-12 ENCOUNTER — Other Ambulatory Visit: Payer: Self-pay

## 2020-08-12 ENCOUNTER — Observation Stay (HOSPITAL_COMMUNITY)
Admission: EM | Admit: 2020-08-12 | Discharge: 2020-08-14 | Disposition: A | Discharge status: Home / Self Care | Payer: Medicaid Other | Attending: Family Medicine | Admitting: Family Medicine

## 2020-08-12 DIAGNOSIS — Z7982 Long term (current) use of aspirin: Secondary | ICD-10-CM | POA: Diagnosis not present

## 2020-08-12 DIAGNOSIS — Z8673 Personal history of transient ischemic attack (TIA), and cerebral infarction without residual deficits: Secondary | ICD-10-CM | POA: Insufficient documentation

## 2020-08-12 DIAGNOSIS — R4701 Aphasia: Principal | ICD-10-CM | POA: Insufficient documentation

## 2020-08-12 DIAGNOSIS — Z79899 Other long term (current) drug therapy: Secondary | ICD-10-CM | POA: Diagnosis not present

## 2020-08-12 DIAGNOSIS — R9431 Abnormal electrocardiogram [ECG] [EKG]: Secondary | ICD-10-CM | POA: Diagnosis not present

## 2020-08-12 DIAGNOSIS — F419 Anxiety disorder, unspecified: Secondary | ICD-10-CM | POA: Diagnosis not present

## 2020-08-12 DIAGNOSIS — R202 Paresthesia of skin: Secondary | ICD-10-CM | POA: Diagnosis not present

## 2020-08-12 DIAGNOSIS — Y900 Blood alcohol level of less than 20 mg/100 ml: Secondary | ICD-10-CM | POA: Diagnosis not present

## 2020-08-12 DIAGNOSIS — R457 State of emotional shock and stress, unspecified: Secondary | ICD-10-CM | POA: Diagnosis not present

## 2020-08-12 DIAGNOSIS — R5381 Other malaise: Secondary | ICD-10-CM | POA: Diagnosis not present

## 2020-08-12 DIAGNOSIS — Z20822 Contact with and (suspected) exposure to covid-19: Secondary | ICD-10-CM | POA: Diagnosis not present

## 2020-08-12 DIAGNOSIS — G459 Transient cerebral ischemic attack, unspecified: Secondary | ICD-10-CM | POA: Diagnosis present

## 2020-08-12 DIAGNOSIS — F410 Panic disorder [episodic paroxysmal anxiety] without agoraphobia: Secondary | ICD-10-CM | POA: Diagnosis not present

## 2020-08-12 LAB — RESP PANEL BY RT-PCR (FLU A&B, COVID) ARPGX2
Influenza A by PCR: NEGATIVE
Influenza B by PCR: NEGATIVE
SARS Coronavirus 2 by RT PCR: NEGATIVE

## 2020-08-12 LAB — CBC WITH DIFFERENTIAL/PLATELET
Abs Immature Granulocytes: 0.01 10*3/uL (ref 0.00–0.07)
Basophils Absolute: 0 10*3/uL (ref 0.0–0.1)
Basophils Relative: 0 %
Eosinophils Absolute: 0.1 10*3/uL (ref 0.0–0.5)
Eosinophils Relative: 2 %
HCT: 35.5 % — ABNORMAL LOW (ref 36.0–46.0)
Hemoglobin: 11.9 g/dL — ABNORMAL LOW (ref 12.0–15.0)
Immature Granulocytes: 0 %
Lymphocytes Relative: 41 %
Lymphs Abs: 2 10*3/uL (ref 0.7–4.0)
MCH: 32.4 pg (ref 26.0–34.0)
MCHC: 33.5 g/dL (ref 30.0–36.0)
MCV: 96.7 fL (ref 80.0–100.0)
Monocytes Absolute: 0.3 10*3/uL (ref 0.1–1.0)
Monocytes Relative: 7 %
Neutro Abs: 2.4 10*3/uL (ref 1.7–7.7)
Neutrophils Relative %: 50 %
Platelets: 206 10*3/uL (ref 150–400)
RBC: 3.67 MIL/uL — ABNORMAL LOW (ref 3.87–5.11)
RDW: 13.3 % (ref 11.5–15.5)
WBC: 4.9 10*3/uL (ref 4.0–10.5)
nRBC: 0 % (ref 0.0–0.2)

## 2020-08-12 LAB — COMPREHENSIVE METABOLIC PANEL
ALT: 24 U/L (ref 0–44)
AST: 23 U/L (ref 15–41)
Albumin: 3.8 g/dL (ref 3.5–5.0)
Alkaline Phosphatase: 48 U/L (ref 38–126)
Anion gap: 3 — ABNORMAL LOW (ref 5–15)
BUN: 14 mg/dL (ref 6–20)
CO2: 28 mmol/L (ref 22–32)
Calcium: 8.7 mg/dL — ABNORMAL LOW (ref 8.9–10.3)
Chloride: 105 mmol/L (ref 98–111)
Creatinine, Ser: 0.86 mg/dL (ref 0.44–1.00)
GFR, Estimated: >60 mL/min (ref >60)
Glucose, Bld: 96 mg/dL (ref 70–99)
Potassium: 3.5 mmol/L (ref 3.5–5.1)
Sodium: 136 mmol/L (ref 135–145)
Total Bilirubin: 0.8 mg/dL (ref 0.3–1.2)
Total Protein: 6.7 g/dL (ref 6.5–8.1)

## 2020-08-12 LAB — APTT: aPTT: 29 seconds (ref 24–36)

## 2020-08-12 LAB — URINALYSIS, ROUTINE W REFLEX MICROSCOPIC
Bilirubin Urine: NEGATIVE
Glucose, UA: NEGATIVE mg/dL
Hgb urine dipstick: NEGATIVE
Ketones, ur: NEGATIVE mg/dL
Leukocytes,Ua: NEGATIVE
Nitrite: NEGATIVE
Protein, ur: NEGATIVE mg/dL
Specific Gravity, Urine: 1.02 (ref 1.005–1.030)
pH: 7 (ref 5.0–8.0)

## 2020-08-12 LAB — RAPID URINE DRUG SCREEN, HOSP PERFORMED
Amphetamines: NOT DETECTED
Barbiturates: NOT DETECTED
Benzodiazepines: NOT DETECTED
Cocaine: NOT DETECTED
Opiates: NOT DETECTED
Tetrahydrocannabinol: NOT DETECTED

## 2020-08-12 LAB — ETHANOL: Alcohol, Ethyl (B): <10 mg/dL (ref <10)

## 2020-08-12 LAB — CBG MONITORING, ED: Glucose-Capillary: 71 mg/dL (ref 70–99)

## 2020-08-12 LAB — PROTIME-INR
INR: 1 (ref 0.8–1.2)
Prothrombin Time: 13.1 seconds (ref 11.4–15.2)

## 2020-08-12 LAB — PREGNANCY, URINE: Preg Test, Ur: NEGATIVE

## 2020-08-12 IMAGING — CT CT HEAD CODE STROKE
3 series · 16 of 47 positions shown, 19 images · non-contrast
Comparison: None.

CLINICAL DATA: Code stroke. Acute anxiety with feeling of tingling
all over

EXAM:
CT HEAD WITHOUT CONTRAST
TECHNIQUE: Contiguous axial images were obtained from the base of the skull
through the vertex without intravenous contrast.

[Series 3: head w o · axial · 0.48mm/px · z∈[+51,+196]mm · 10 of 35 slices shown, 13 images]
[im 3/35  brain]
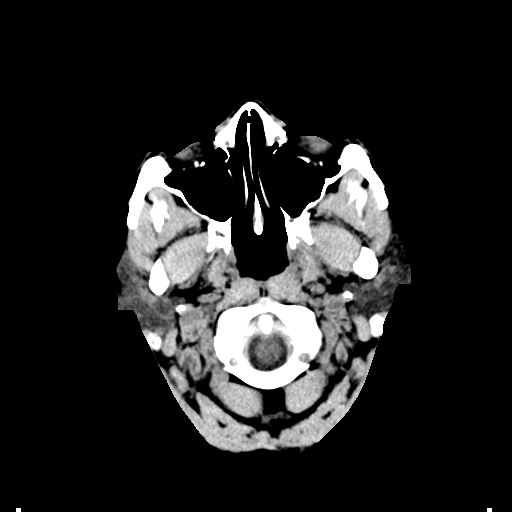
[im 3/35  bone]
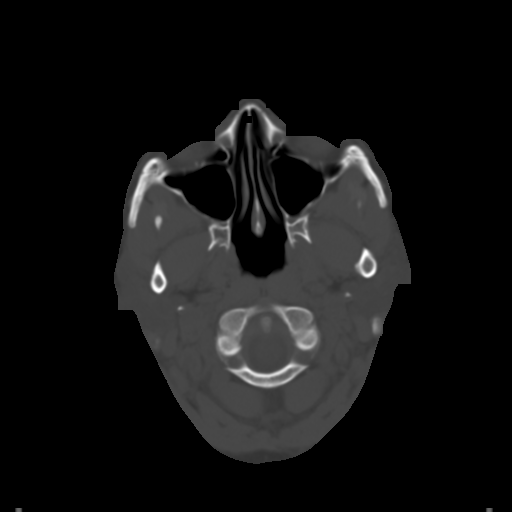
[im 6/35  brain]
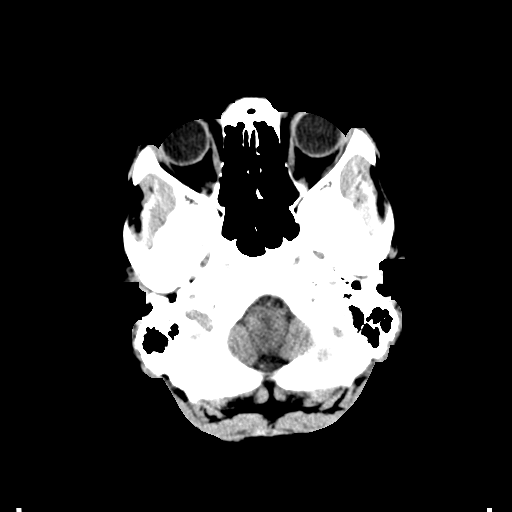
[im 10/35  brain]
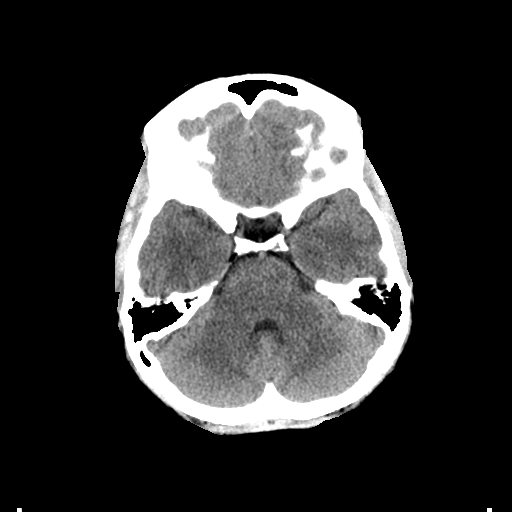
[im 12/35  brain]
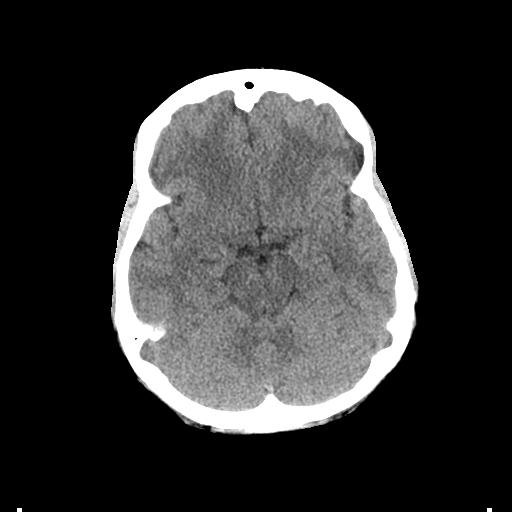
[im 16/35  brain]
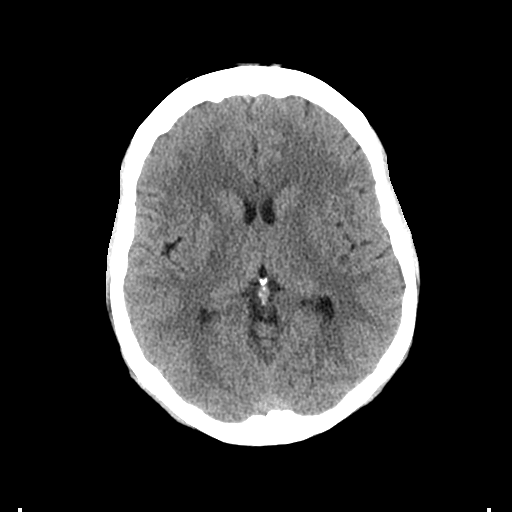
[im 16/35  bone]
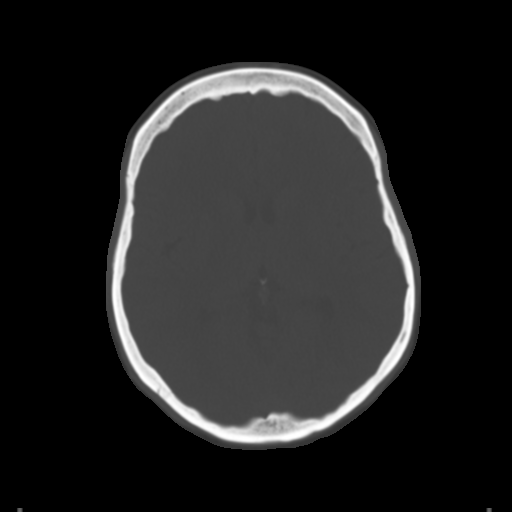
[im 19/35  brain]
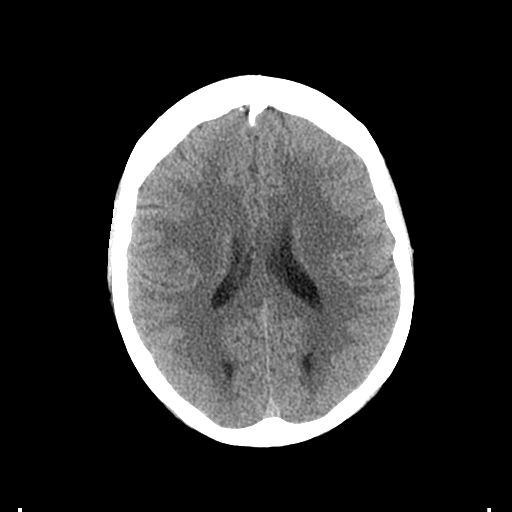
[im 23/35  brain]
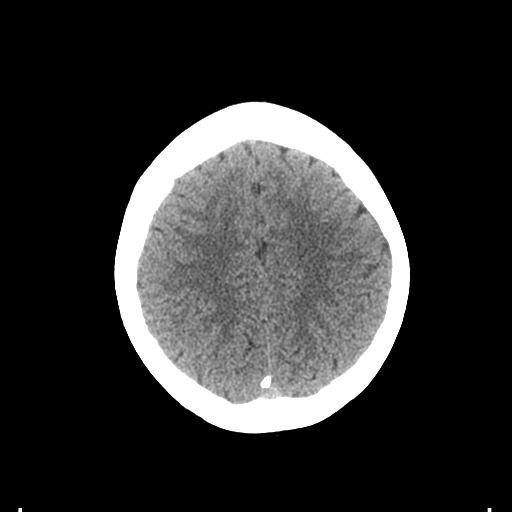
[im 26/35  brain]
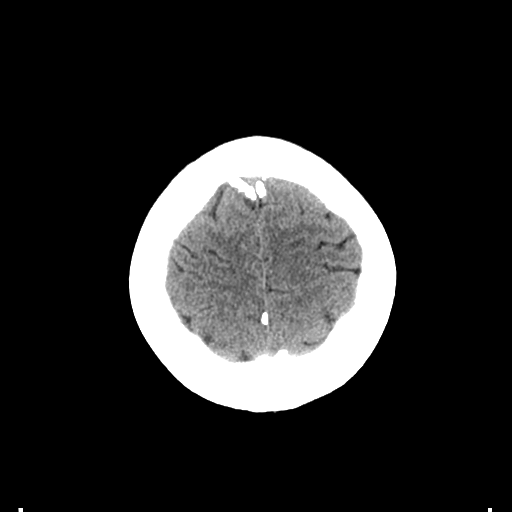
[im 29/35  brain]
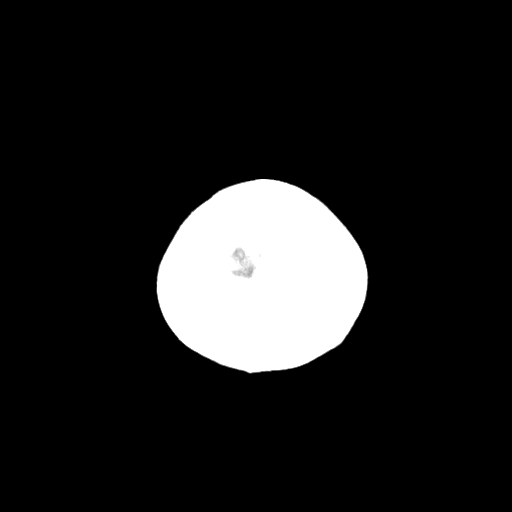
[im 29/35  bone]
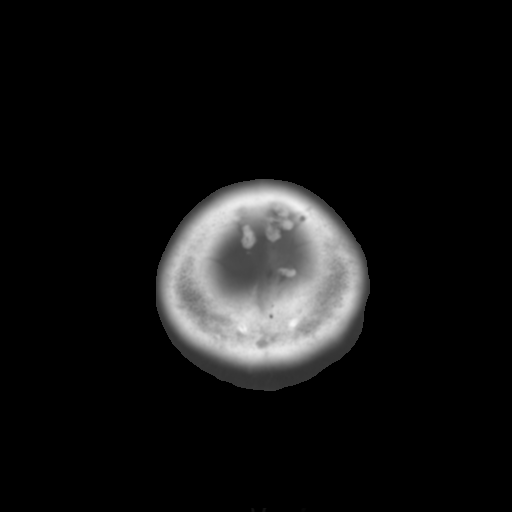
[im 32/35  brain]
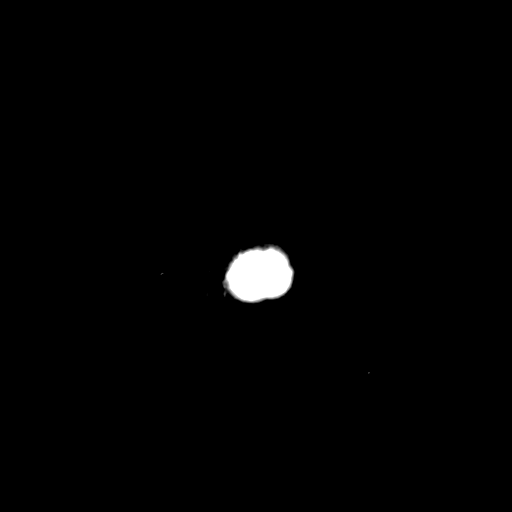

[Series 5: coronal soft · coronal · 0.34mm/px · 3 of 69 slices shown]
[im 23/69  brain]
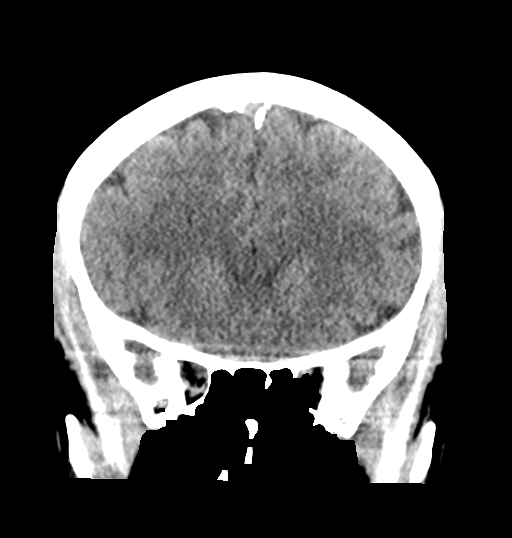
[im 31/69  brain]
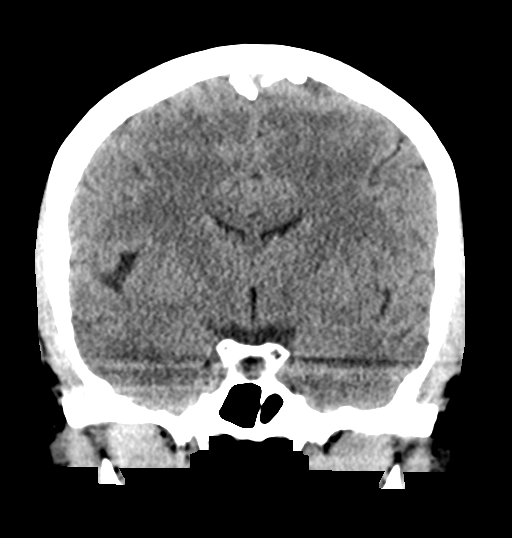
[im 38/69  brain]
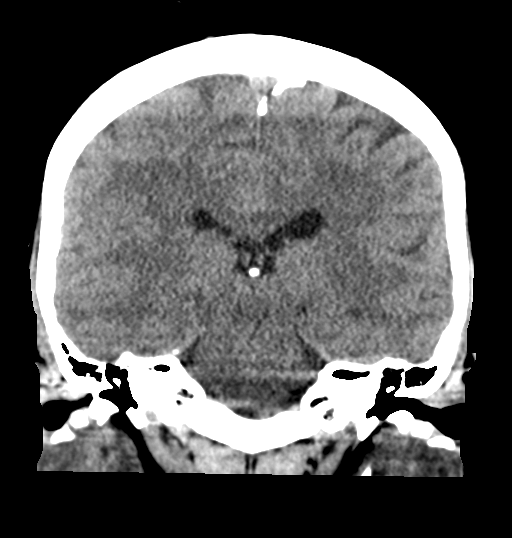

[Series 6: sagittal soft · sagittal · 0.36mm/px · 3 of 59 slices shown]
[im 20/59  brain]
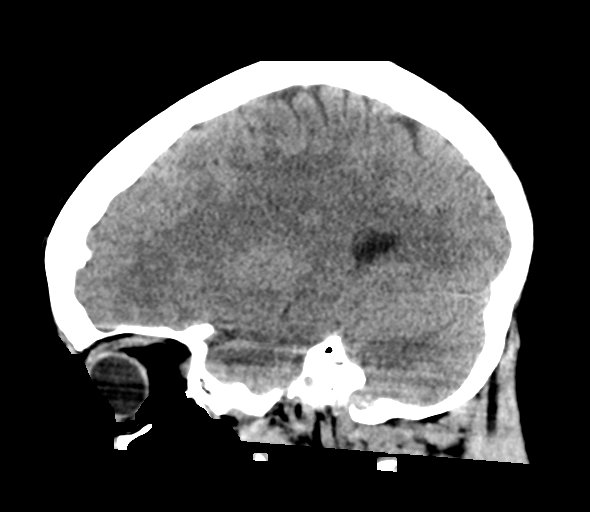
[im 30/59  brain]
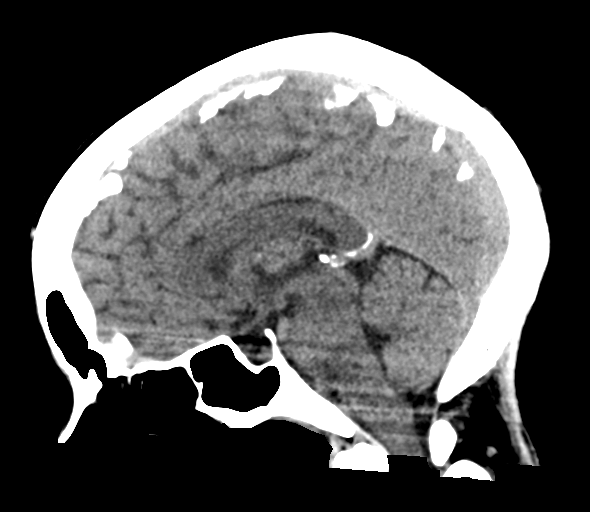
[im 39/59  brain]
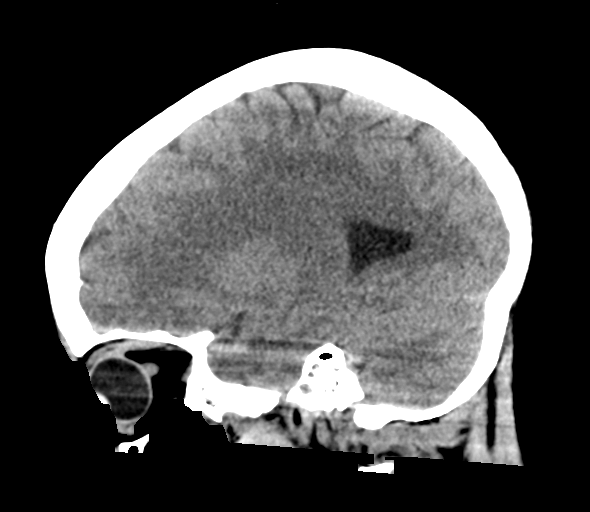

[16 of 47 positions shown; findings below may reference images not displayed]

FINDINGS: Brain: There is no mass, hemorrhage or extra-axial collection. The
size and configuration of the ventricles and extra-axial CSF spaces
are normal. The brain parenchyma is normal, without evidence of
acute or chronic infarction.

Vascular: No abnormal hyperdensity of the major intracranial
arteries or dural venous sinuses. No intracranial atherosclerosis.

Skull: The visualized skull base, calvarium and extracranial soft
tissues are normal.

Sinuses/Orbits: No fluid levels or advanced mucosal thickening of
the visualized paranasal sinuses. No mastoid or middle ear effusion.
The orbits are normal.

ASPECTS (Alberta Stroke Program Early CT Score)

- Ganglionic level infarction (caudate, lentiform nuclei, internal
capsule, insula, M1-M3 cortex): 7

- Supraganglionic infarction (M4-M6 cortex): 3

Total score (0-10 with 10 being normal): 10
IMPRESSION: 1. Normal head CT.
2. ASPECTS is 10.

## 2020-08-12 MED ORDER — SODIUM CHLORIDE 0.9 % IV BOLUS
1000.0000 mL | Freq: Once | INTRAVENOUS | Status: AC
  Administered 2020-08-12: 1000 mL via INTRAVENOUS

## 2020-08-12 NOTE — ED Notes (Signed)
Pt tearful during neuro consult, with inability to make sounds or words. Pt provided dry erase marker and board to communicate. Pt wrote she does not want tPa and does not want an MRI. EDP Notified.

## 2020-08-12 NOTE — ED Triage Notes (Signed)
Pt brought in by [facility] from home after having anxiety attack. Pt has hx of same and was prescribed buspar but didn't like the way it made her feel so she stopped taking it. Pt c/o tingling all over.

## 2020-08-12 NOTE — ED Notes (Signed)
This RN observed Pt developing right arm tremors with expressive aphasia following initiation of IV fluids. EDP notified. Neuro Cart moved to bedside.

## 2020-08-12 NOTE — Consult Note (Signed)
[facility] Tele[facility]urology Consult Services   Date of Service:   08/12/2020 21:46:56  Diagnosis:       R47.01 - Aphasia       G25.2 - Other specified forms of tremor  Impression:      40 yr old right handed female with significant PMHx of Cerebrovascular accident due to embolism (HCC) 10/08/2015         R frontal with residual left arm numbness, . Hx of PFO with PFO closure,        migraine, LKW 21:35 having anxiety with full body numbness / tingling (she wrote on paper her numbness stated while in the ambulance) right arm tremors and word -finding difficulty (she is able to whisper).. CT head non acute, NIHSS = 5 Patient is a candidate for IV thrombolytics with her symptoms and after discussion patient declined treatment... Advise admit for CVA wokr-up... start ASA. etiology possible TIA, CVA. recrudescence of her old CVA, anxiety  Metrics: Last Known Well: 08/12/2020 21:35:00 [facility] Notification Time: 08/12/2020 21:46:56 Arrival Time: 08/12/2020 20:45:00 Stamp Time: 08/12/2020 21:46:56 Initial Response Time: 08/12/2020 21:49:00 Symptoms: full body numbness / tingling right arm tremors and word -fining difficulty . NIHSS Start Assessment Time: 08/12/2020 22:02:25 Patient is not a candidate for Thrombolytic. Thrombolytic Medical Decision: 08/12/2020 22:27:14 Patient was not deemed candidate for Thrombolytic because of following reasons: offered alteplase. patient declined.  CT head showed no acute hemorrhage or acute core infarct.  ED Physician notified of diagnostic impression and management plan on 08/12/2020 22:57:02  Advanced Imaging: Advanced Imaging Not Recommended because: low suspicion for LVO   Our recommendations are outlined below.  Recommendations:        Stroke/Telemetry Floor       Neuro Checks       Bedside Swallow Eval       DVT Prophylaxis       IV Fluids, Normal Saline       Head of Bed 30 Degrees       Euglycemia and Avoid  Hyperthermia (PRN Acetaminophen)       Initiate or continue Aspirin 81 MG daily       Antihypertensives PRN if Blood pressure is greater than 220/120 or there is a concern for End organ damage/contraindications for permissive HTN. If blood pressure is greater than 220/120 give labetalol PO or IV or Vasotec IV with a goal of 15% reduction in BP during the first 24 hours.  Routine Consultation with Inhouse Neurology for Follow up Care  Sign Out:       Discussed with Emergency Department Provider    ------------------------------------------------------------------------------  History of Present Illness: Patient is a 40 year old Female.  Patient was brought by EMS for symptoms of full body numbness / tingling right arm tremors and word -fining difficulty .  40 yr old right handed female with significant PMHx of Cerebrovascular accident due to embolism (HCC) 10/08/2015   R frontal with residual left arm numbness, . Hx of PFO with PFO closure, migraine, was in her usual sate of health when she reports having anxiety with full body numbness / tingling (she wrote on paper her numbness stated while in the ambulance) right arm tremors and word -finding difficulty (she is able to whisper) . she is not taking ASA.    Past Medical History:      Hypertension      Hyperlipidemia      Stroke      There is NO history of Diabetes Mellitus  There is NO history of Atrial Fibrillation      There is NO history of Coronary Artery Disease      There is NO history of Covid-19  Review of System:  14 Points Review of Systems was performed and was negative except mentioned in HPI.  Anticoagulant use:  No  Antiplatelet use: No  Allergies:  Reviewed     Examination: BP(115/68), Pulse(74), Blood Glucose(71) 1A: Level of Consciousness - Alert; keenly responsive + 0 1B: Ask Month and Age - Aphasic + 2 1C: Blink Eyes & Squeeze Hands - Performs Both Tasks + 0 2: Test Horizontal Extraocular Movements  - Normal + 0 3: Test Visual Fields - No Visual Loss + 0 4: Test Facial Palsy (Use Grimace if Obtunded) - Normal symmetry + 0 5A: Test Left Arm Motor Drift - No Drift for 10 Seconds + 0 5B: Test Right Arm Motor Drift - No Drift for 10 Seconds + 0 6A: Test Left Leg Motor Drift - No Drift for 5 Seconds + 0 6B: Test Right Leg Motor Drift - No Drift for 5 Seconds + 0 7: Test Limb Ataxia (FNF/Heel-Shin) - No Ataxia + 0 8: Test Sensation - Normal; No sensory loss + 0 9: Test Language/Aphasia - Mute/Global Aphasia: No Usable Speech/Auditory Comprehension + 3 10: Test Dysarthria - Normal + 0 11: Test Extinction/Inattention - No abnormality + 0  NIHSS Score: 5   Pre-Morbid Modified Rankin Scale: 0 Points = No symptoms at all   Patient/Family was informed the Neurology Consult would occur via TeleHealth consult by way of interactive audio and video telecommunications and consented to receiving care in this manner.   Patient is being evaluated for possible acute neurologic impairment and high probability of imminent or life-threatening deterioration. I spent total of 42 minutes providing care to this patient, including time for face to face visit via telemedicine, review of medical records, imaging studies and discussion of findings with providers, the patient and/or family.   Dr Sampson Goon   [facility] [phone]  Case [id number]

## 2020-08-12 NOTE — ED Provider Notes (Signed)
[facility] EMERGENCY DEPARTMENT Provider Note   CSN: [id number] Arrival date & time: 08/12/20  2045     History Chief Complaint  Patient presents with   Anxiety    Marissa [name] is a 40 y.o. female.  Patient initially arrived here stating that she had anxiety attack in the car she felt dizzy and weak.  While she was in the emergency department she started to have aphasia.  Patient supposedly has a history of anxiety and a stroke.  The history is provided by the patient and a relative. No language interpreter was used.  Anxiety This is a recurrent problem. The current episode started less than 1 hour ago. The problem occurs rarely. The problem has been resolved. Pertinent negatives include no chest pain, no abdominal pain and no headaches. Nothing aggravates the symptoms. Nothing relieves the symptoms. She has tried nothing for the symptoms. The treatment provided no relief.      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) 10/08/2015   R  frontal   Common migraine with intractable migraine 11/08/2015   Encounter for gynecological examination with Papanicolaou smear of cervix 12/30/2018   Hyperlipidemia    Stroke [facility])    Stroke [facility])    Vitamin D deficiency     Patient Active Problem List   Diagnosis Date Noted   Cough 02/02/2020   COVID-19 virus detected 02/02/2020   Bilateral hand numbness 12/13/2019   Swelling of both hands 12/13/2019   Annual visit for general adult medical examination with abnormal findings 08/30/2019   History of CVA (cerebrovascular accident) 12/09/2018   Anxiety 12/09/2018   Obesity (BMI 30.0-34.9) 12/09/2018   Common migraine with intractable migraine 11/08/2015   PFO (patent foramen ovale) 10/08/2015   HLD (hyperlipidemia) 10/08/2015   Vitamin D deficiency 10/08/2015    Past Surgical History:  Procedure Laterality Date   CESAREAN SECTION     PATENT  FORAMEN OVALE CLOSURE  09/25/2015   TUBAL LIGATION       OB History     Gravida  3   Para  3   Term  2   Preterm  1   AB      Living  3      SAB      IAB      Ectopic      Multiple      Live Births              Family History  Problem Relation Age of Onset   Hypertension Mother    Heart disease Maternal Uncle        murmur   Heart disease Maternal Grandmother    COPD Maternal Grandfather    Cancer Maternal Grandfather        lung   Heart disease Maternal Aunt        murmur   Migraines Neg Hx     Social History   Tobacco Use   Smoking status: Never   Smokeless tobacco: Never  Substance Use Topics   Alcohol use: No   Drug use: No    Home Medications Prior to Admission medications   Medication Sig Start Date End Date Taking? Authorizing Provider  albuterol (VENTOLIN HFA) 108 (90 Base) MCG/ACT inhaler Inhale 1-2 puffs into the lungs every 6 (six) hours as needed for wheezing or shortness of breath. 09/24/19   Rennis Harding, PA-C  aspirin 81 MG chewable tablet Chew 81 mg by mouth once as needed.    [provider]  benzonatate (TESSALON) 100 MG capsule Take 1 capsule (100 mg total) by mouth 3 (three) times daily as needed for cough. 06/28/20   Lamptey, Britta Mccreedy, MD  ibuprofen (ADVIL,MOTRIN) 600 MG tablet Take 1 tablet (600 mg total) by mouth every 6 (six) hours as needed. 09/11/17   Eber Hong, MD  promethazine-dextromethorphan (PROMETHAZINE-DM) 6.25-15 MG/5ML syrup Take 2.5 mLs by mouth 2 (two) times daily as needed for cough. 02/02/20   Freddy Finner, NP  Vitamin D, Ergocalciferol, (DRISDOL) 1.25 MG (50000 UNIT) CAPS capsule Take 1 capsule (50,000 Units total) by mouth every 7 (seven) days. 09/08/19   Freddy Finner, NP    Allergies    Triptans and Morphine and related  Review of Systems   Review of Systems  Constitutional:  Negative for appetite change and fatigue.  HENT:  Negative for congestion, ear discharge and sinus pressure.    Eyes:  Negative for discharge.  Respiratory:  Negative for cough.   Cardiovascular:  Negative for chest pain.  Gastrointestinal:  Negative for abdominal pain and diarrhea.  Genitourinary:  Negative for frequency and hematuria.  Musculoskeletal:  Negative for back pain.  Skin:  Negative for rash.  Neurological:  Positive for dizziness. Negative for seizures and headaches.  Psychiatric/Behavioral:  Negative for hallucinations.    Physical Exam Updated Vital Signs BP 108/72   Pulse 78   Temp 99.4 F (37.4 C) (Oral)   Resp 16   Ht 5\' 2"  (1.575 m)   Wt 77.1 kg   SpO2 99%   BMI 31.09 kg/m   Physical Exam Vitals and nursing note reviewed.  Constitutional:      Appearance: She is well-developed.  HENT:     Head: Normocephalic.     Nose: Nose normal.  Eyes:     General: No scleral icterus.    Conjunctiva/sclera: Conjunctivae normal.  Neck:     Thyroid: No thyromegaly.  Cardiovascular:     Rate and Rhythm: Normal rate and regular rhythm.     Heart sounds: No murmur heard.   No friction rub. No gallop.  Pulmonary:     Breath sounds: No stridor. No wheezing or rales.  Chest:     Chest wall: No tenderness.  Abdominal:     General: There is no distension.     Tenderness: There is no abdominal tenderness. There is no rebound.  Musculoskeletal:        General: Normal range of motion.     Cervical back: Neck supple.  Lymphadenopathy:     Cervical: No cervical adenopathy.  Skin:    Findings: No erythema or rash.  Neurological:     Mental Status: She is alert and oriented to person, place, and time.     Motor: No abnormal muscle tone.     Coordination: Coordination normal.  Psychiatric:        Behavior: Behavior normal.    ED Results / Procedures / Treatments   Labs (all labs ordered are listed, but only abnormal results are displayed) Labs Reviewed  CBC WITH DIFFERENTIAL/PLATELET - Abnormal; Notable for the following components:      Result Value   RBC 3.67 (*)     Hemoglobin 11.9 (*)    HCT 35.5 (*)    All other components within normal limits  COMPREHENSIVE METABOLIC PANEL - Abnormal; Notable for the following components:   Calcium 8.7 (*)  Anion gap 3 (*)    All other components within normal limits  RESP PANEL BY RT-PCR (FLU A&B, COVID) ARPGX2  ETHANOL  PROTIME-INR  APTT  RAPID URINE DRUG SCREEN, HOSP PERFORMED  URINALYSIS, ROUTINE W REFLEX MICROSCOPIC  PREGNANCY, URINE  CBG MONITORING, ED    EKG None  Radiology CT HEAD CODE STROKE WO CONTRAST  Result Date: 08/12/2020 CLINICAL DATA:  Code stroke. Acute anxiety with feeling of tingling all over EXAM: CT HEAD WITHOUT CONTRAST TECHNIQUE: Contiguous axial images were obtained from the base of the skull through the vertex without intravenous contrast. COMPARISON:  None. FINDINGS: Brain: There is no mass, hemorrhage or extra-axial collection. The size and configuration of the ventricles and extra-axial CSF spaces are normal. The brain parenchyma is normal, without evidence of acute or chronic infarction. Vascular: No abnormal hyperdensity of the major intracranial arteries or dural venous sinuses. No intracranial atherosclerosis. Skull: The visualized skull base, calvarium and extracranial soft tissues are normal. Sinuses/Orbits: No fluid levels or advanced mucosal thickening of the visualized paranasal sinuses. No mastoid or middle ear effusion. The orbits are normal. ASPECTS [facility] Stroke Program Early CT Score) - Ganglionic level infarction (caudate, lentiform nuclei, internal capsule, insula, M1-M3 cortex): 7 - Supraganglionic infarction (M4-M6 cortex): 3 Total score (0-10 with 10 being normal): 10 IMPRESSION: 1. Normal head CT. 2. ASPECTS is 10. Electronically Signed   By: Deatra Robinson M.D.   On: 08/12/2020 21:59    Procedures Procedures   Medications Ordered in ED Medications  sodium chloride 0.9 % bolus 1,000 mL (0 mLs Intravenous Stopped 08/12/20 2322)    ED Course  I have reviewed  the triage vital signs and the nursing notes.  Pertinent labs & imaging results that were available during my care of the patient were reviewed by me and considered in my medical decision making (see chart for details). Patient's initial exam was normal.  Then after she was in the emergency department approximately 20 minutes she became aphasic and code stroke was called  CRITICAL CARE Performed by: Bethann Berkshire Total critical care time: 40 minutes Critical care time was exclusive of separately billable procedures and treating other patients. Critical care was necessary to treat or prevent imminent or life-threatening deterioration. Critical care was time spent personally by me on the following activities: development of treatment plan with patient and/or surrogate as well as nursing, discussions with consultants, evaluation of patient's response to treatment, examination of patient, obtaining history from patient or surrogate, ordering and performing treatments and interventions, ordering and review of laboratory studies, ordering and review of radiographic studies, pulse oximetry and re-evaluation of patient's condition. Patient was seen by telemetry neurology who initially recommended tPA.  The patient refused tPA.  I spoke with the neurologist who stated she thought it was more likely anxiety related and recommends admission to the hospital with aspirin treatment and MRI and MRA tomorrow MDM Rules/Calculators/A&P                           Patient with aphasia.  Stroke stroke symptoms.  Patient will get an MRI tomorrow and will be admitted to medicine Final Clinical Impression(s) / ED Diagnoses Final diagnoses:  Aphasia    Rx / DC Orders ED Discharge Orders     None        Bethann Berkshire, MD 08/13/20 1125

## 2020-08-12 NOTE — ED Notes (Signed)
Attending Physician at bedside.

## 2020-08-12 NOTE — ED Notes (Signed)
ED Provider at bedside. 

## 2020-08-13 ENCOUNTER — Observation Stay (HOSPITAL_COMMUNITY): Payer: Medicaid Other

## 2020-08-13 ENCOUNTER — Observation Stay (HOSPITAL_COMMUNITY)
Admit: 2020-08-13 | Discharge: 2020-08-13 | Disposition: A | Discharge status: Home / Self Care | Payer: Medicaid Other | Attending: Family Medicine | Admitting: Family Medicine

## 2020-08-13 ENCOUNTER — Observation Stay (HOSPITAL_BASED_OUTPATIENT_CLINIC_OR_DEPARTMENT_OTHER): Payer: Medicaid Other

## 2020-08-13 DIAGNOSIS — R202 Paresthesia of skin: Secondary | ICD-10-CM

## 2020-08-13 DIAGNOSIS — R4701 Aphasia: Secondary | ICD-10-CM

## 2020-08-13 DIAGNOSIS — R29818 Other symptoms and signs involving the nervous system: Secondary | ICD-10-CM | POA: Diagnosis not present

## 2020-08-13 DIAGNOSIS — G459 Transient cerebral ischemic attack, unspecified: Secondary | ICD-10-CM | POA: Diagnosis present

## 2020-08-13 DIAGNOSIS — R531 Weakness: Secondary | ICD-10-CM | POA: Diagnosis not present

## 2020-08-13 DIAGNOSIS — I6523 Occlusion and stenosis of bilateral carotid arteries: Secondary | ICD-10-CM | POA: Diagnosis not present

## 2020-08-13 LAB — LIPID PANEL
Cholesterol: 162 mg/dL (ref 0–200)
HDL: 68 mg/dL (ref >40)
LDL Cholesterol: 88 mg/dL (ref 0–99)
Total CHOL/HDL Ratio: 2.4 RATIO
Triglycerides: 32 mg/dL (ref <150)
VLDL: 6 mg/dL (ref 0–40)

## 2020-08-13 LAB — ECHOCARDIOGRAM COMPLETE
AR max vel: 2.13 cm2
AV Area VTI: 2.31 cm2
AV Area mean vel: 2.1 cm2
AV Mean grad: 3 mmHg
AV Peak grad: 5.4 mmHg
Ao pk vel: 1.16 m/s
Area-P 1/2: 2.97 cm2
Height: 62 in
MV VTI: 1.89 cm2
S' Lateral: 2.56 cm
Weight: 2816 oz

## 2020-08-13 LAB — TROPONIN I (HIGH SENSITIVITY)
Troponin I (High Sensitivity): 2 ng/L (ref <18)
Troponin I (High Sensitivity): 2 ng/L (ref <18)

## 2020-08-13 LAB — HIV ANTIBODY (ROUTINE TESTING W REFLEX): HIV Screen 4th Generation wRfx: NONREACTIVE

## 2020-08-13 IMAGING — US US CAROTID DUPLEX BILAT
1 series · 14 of 24 positions shown · non-contrast
Comparison: None.

CLINICAL DATA: Right arm tingling

EXAM:
BILATERAL CAROTID DUPLEX ULTRASOUND
TECHNIQUE: Gray scale imaging, color Doppler and duplex ultrasound were
performed of bilateral carotid and vertebral arteries in the neck.

[Series 1: us carotid bilateral · 14 of 68 slices shown]
[im 1/68]
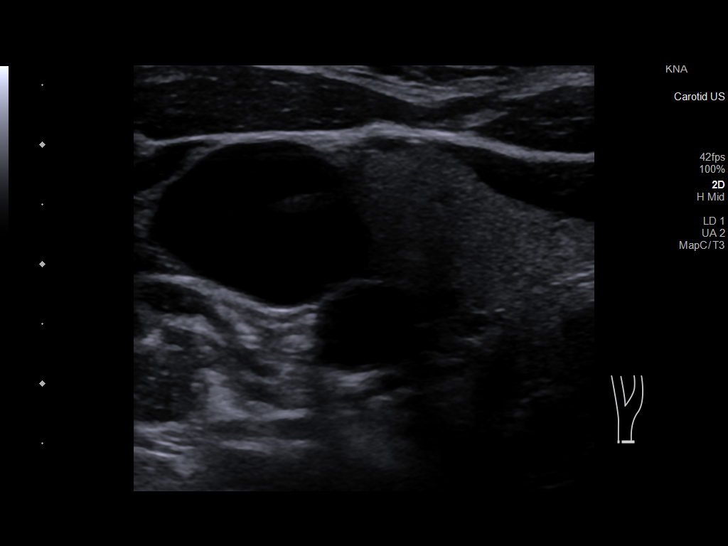
[im 6/68]
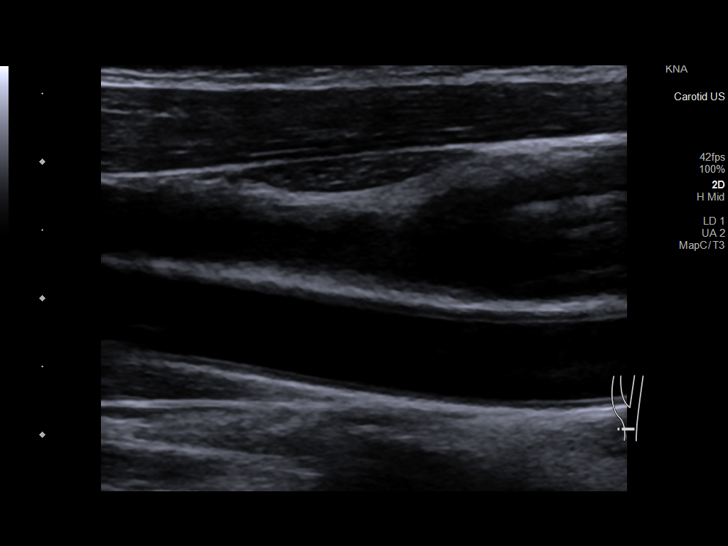
[im 12/68]
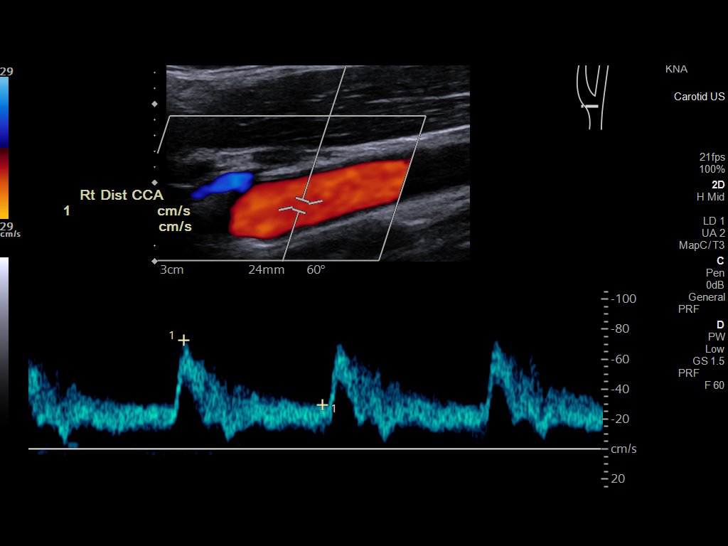
[im 18/68]
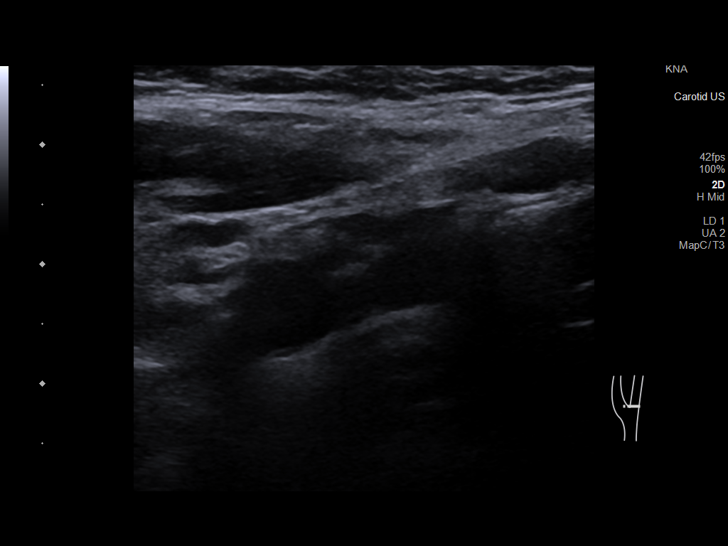
[im 21/68]
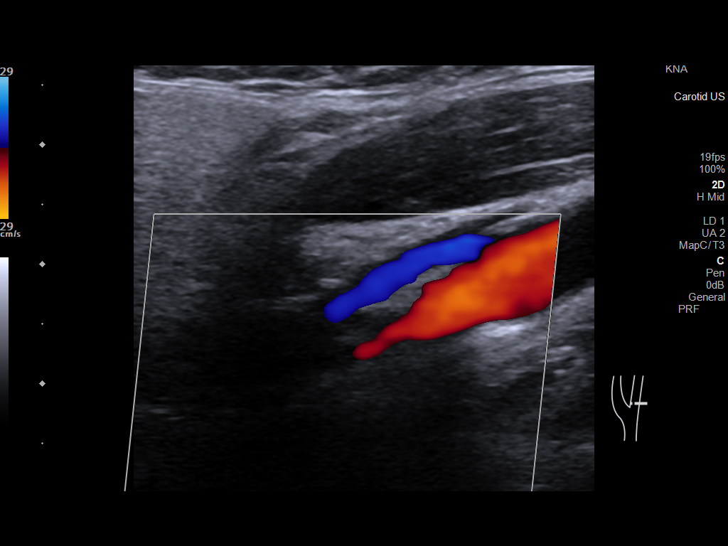
[im 27/68]
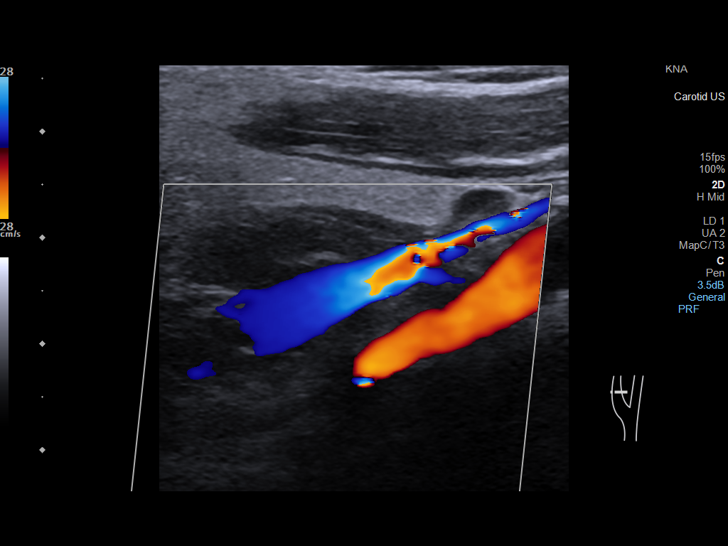
[im 33/68]
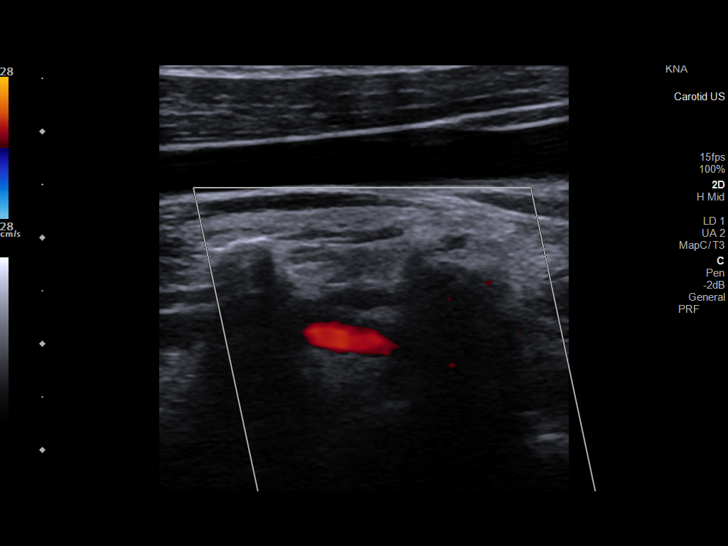
[im 35/68]
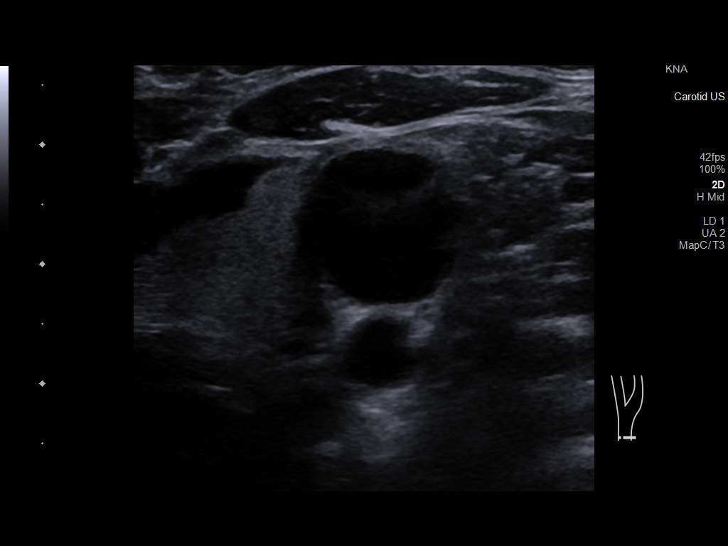
[im 41/68]
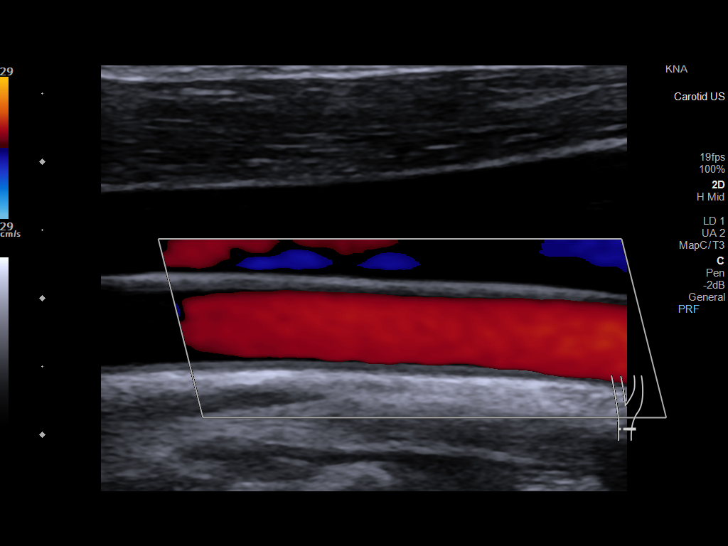
[im 47/68]
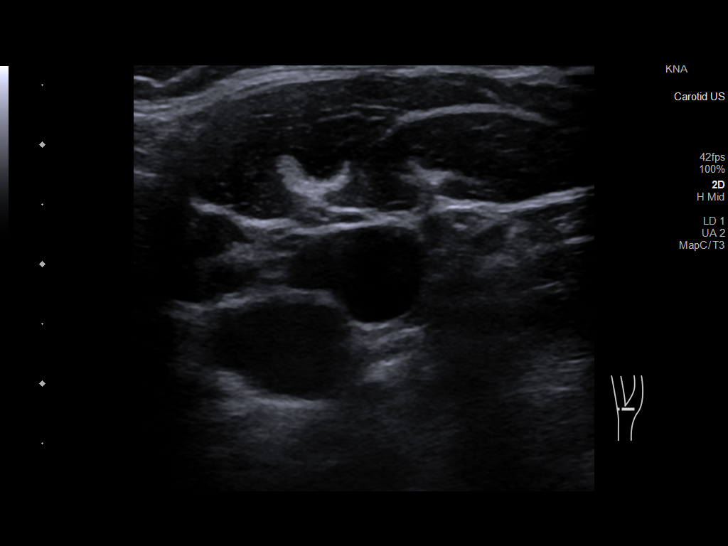
[im 53/68]
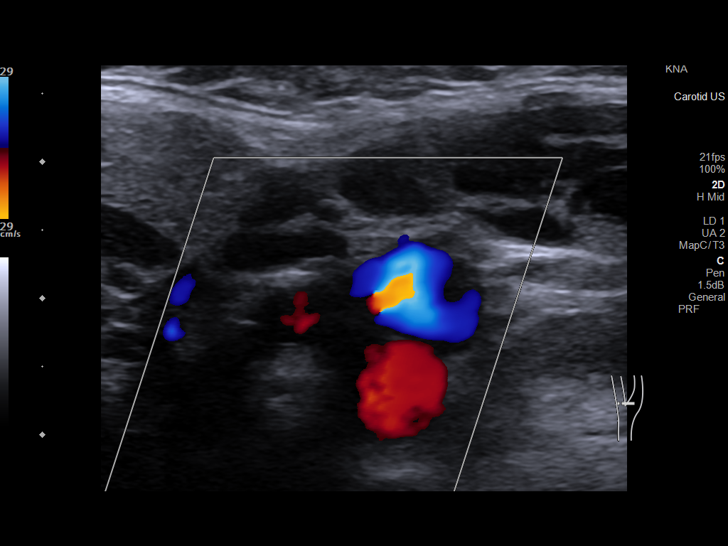
[im 56/68]
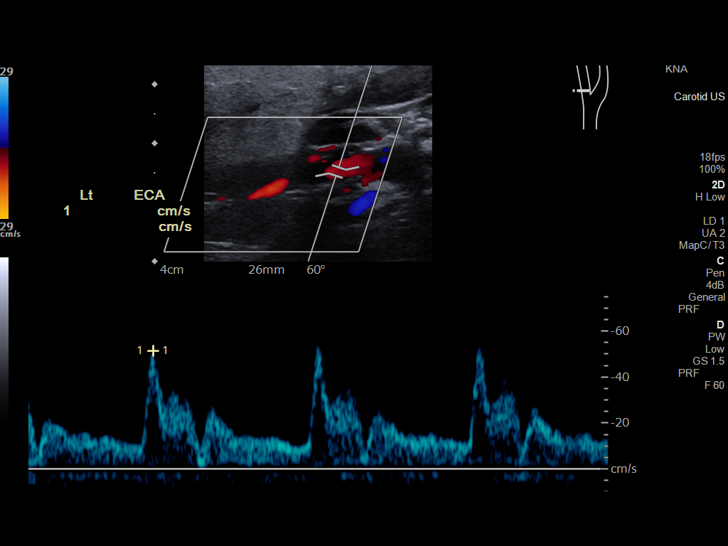
[im 62/68]
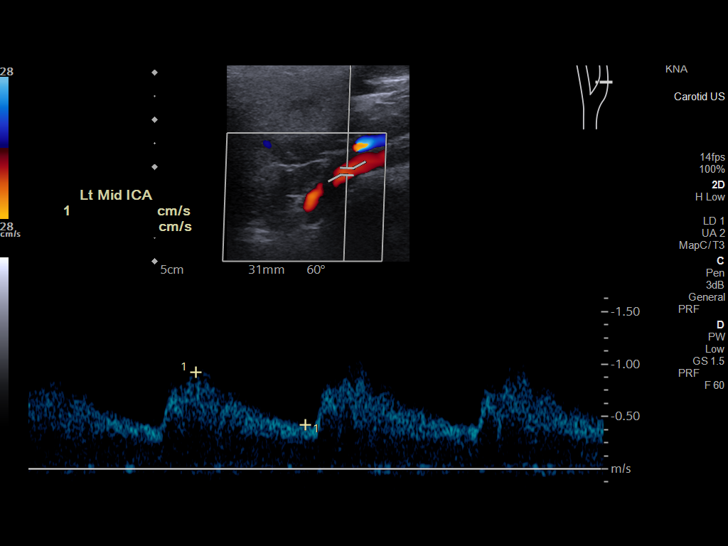
[im 68/68]
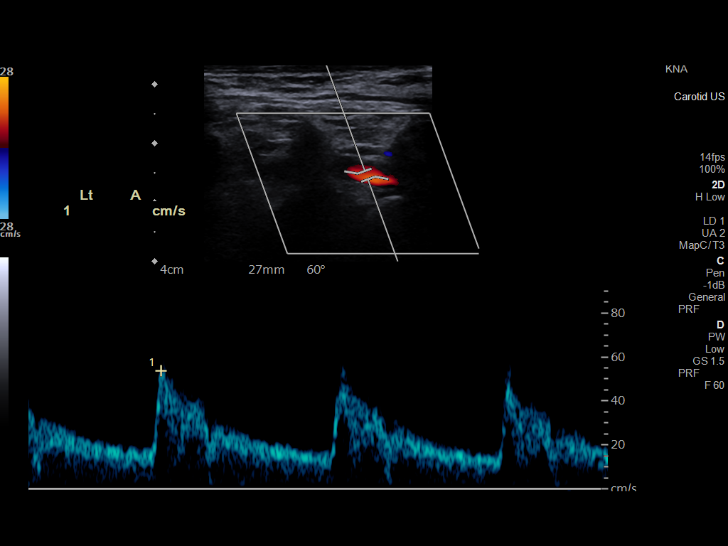

[14 of 24 positions shown; findings below may reference images not displayed]

FINDINGS: Criteria: Quantification of carotid stenosis is based on velocity
parameters that correlate the residual internal carotid diameter
with NASCET-based stenosis levels, using the diameter of the distal
internal carotid lumen as the denominator for stenosis measurement.

The following velocity measurements were obtained:

RIGHT
ICA: 103/38 cm/sec
CCA: 84/23 cm/sec

SYSTOLIC ICA/CCA RATIO:

ECA:  55 cm/sec

LEFT

ICA: 92/42 cm/sec

CCA: 93/27 cm/sec

SYSTOLIC ICA/CCA RATIO:

ECA:  51 cm/sec

RIGHT CAROTID ARTERY: No significant atherosclerotic plaque or
evidence of stenosis in the internal carotid artery.

RIGHT VERTEBRAL ARTERY:  Patent with normal antegrade flow.

LEFT CAROTID ARTERY: No significant atherosclerotic plaque or
evidence of stenosis in the internal carotid artery.

LEFT VERTEBRAL ARTERY:  Patent with normal antegrade flow.
IMPRESSION: Normal bilateral carotid duplex ultrasound.

## 2020-08-13 IMAGING — MR MR HEAD W/O CM
11 of 12 series · 27 of 48 positions shown · non-contrast
Comparison: Prior non-contrast head CT examinations [DATE] and
earlier.

CLINICAL DATA: Neuro deficit, acute, stroke suspected. Additional
history provided: Patient reports bilateral weakness for 2 days.

EXAM:
MRI HEAD WITHOUT CONTRAST
MRA HEAD WITHOUT CONTRAST
TECHNIQUE: Multiplanar, multi-echo pulse sequences of the brain and surrounding
structures were acquired without intravenous contrast. Angiographic
images of the Circle of Willis were acquired using MRA technique
without intravenous contrast.

[Series 5: DWI · axial · 4.0mm · 0.88mm/px · z∈[-112,+20]mm · 3 of 36 slices shown (1 of 6)]
[im 1/36]
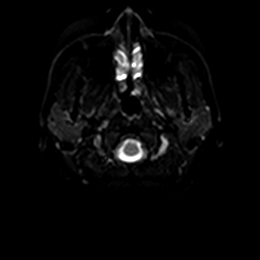
[im 18/36]
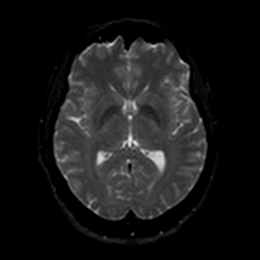
[im 36/36]
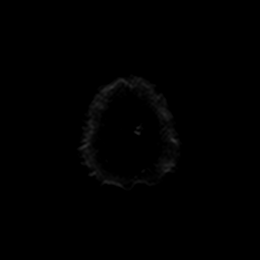

[Series 5: DWI · axial · 4.0mm · 0.88mm/px · z∈[-112,+20]mm · 3 of 36 slices shown (2 of 6)]
[im 1/36]
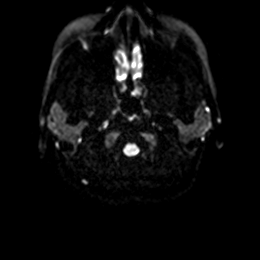
[im 18/36]
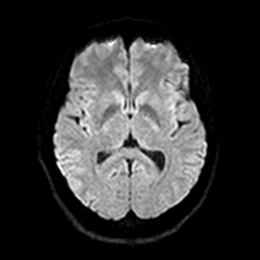
[im 36/36]
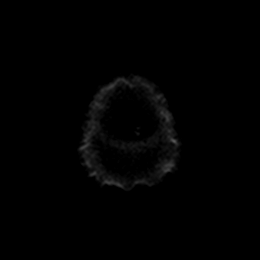

[Series 6: DWI · axial · 4.0mm · 0.88mm/px · z∈[-112,+20]mm · 3 of 36 slices shown (3 of 6)]
[im 1/36]
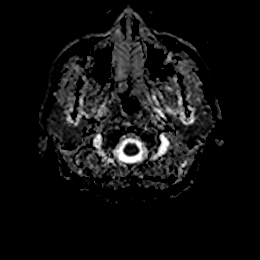
[im 18/36]
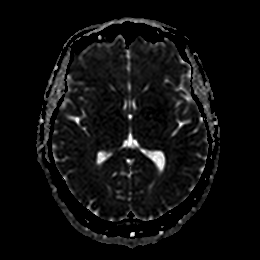
[im 36/36]
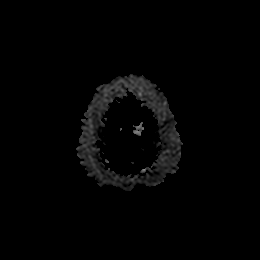

[Series 7: DWI · coronal · 5.0mm · 0.88mm/px · 3 of 28 slices shown (4 of 6)]
[im 1/28]
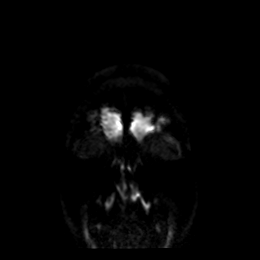
[im 14/28]
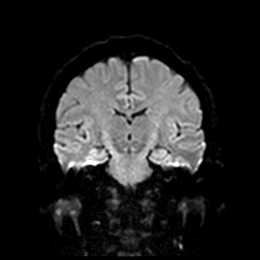
[im 28/28]
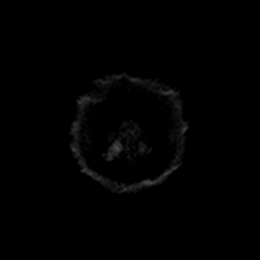

[Series 7: DWI · coronal · 5.0mm · 0.88mm/px · 3 of 28 slices shown (5 of 6)]
[im 1/28]
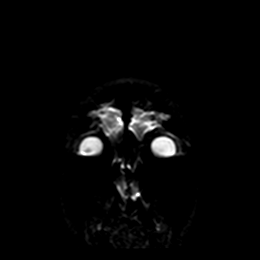
[im 14/28]
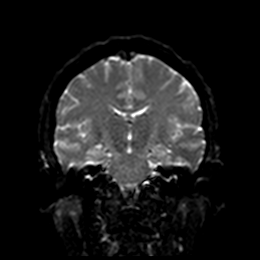
[im 28/28]
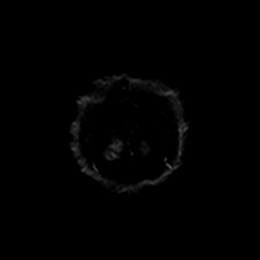

[Series 8: DWI · coronal · 5.0mm · 0.88mm/px · 3 of 28 slices shown (6 of 6)]
[im 1/28]
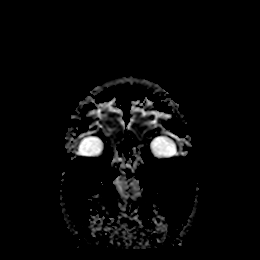
[im 14/28]
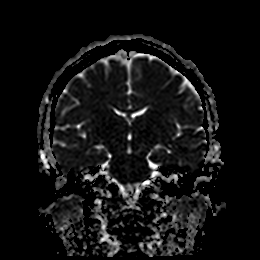
[im 28/28]
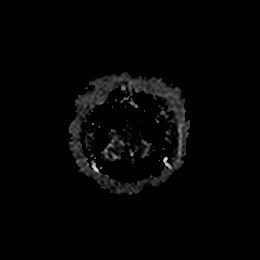

[Series 14: T1 · sagittal · 5.0mm · 0.94mm/px · 3 of 25 slices shown]
[im 1/25]
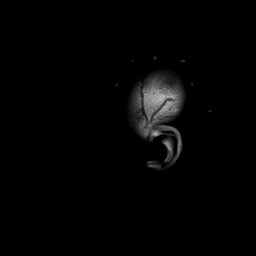
[im 13/25]
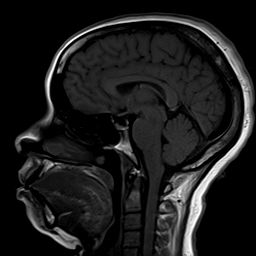
[im 25/25]
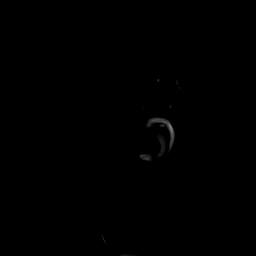

[Series 1023: rotate · 0.34mm/px · 1 of 5 slices shown (1 of 3)]
[im 1/5]
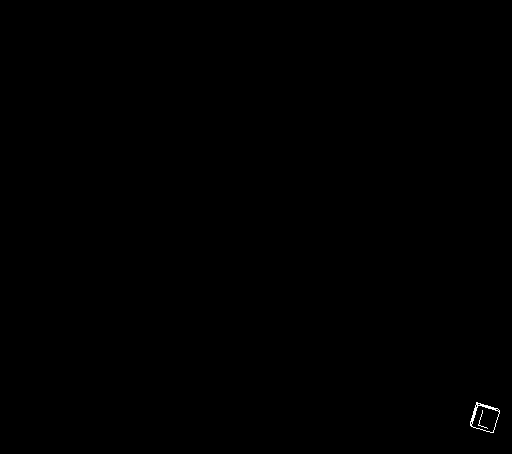

[Series 1031: rotate · 0.34mm/px · 1 of 8 slices shown (2 of 3)]
[im 1/8]
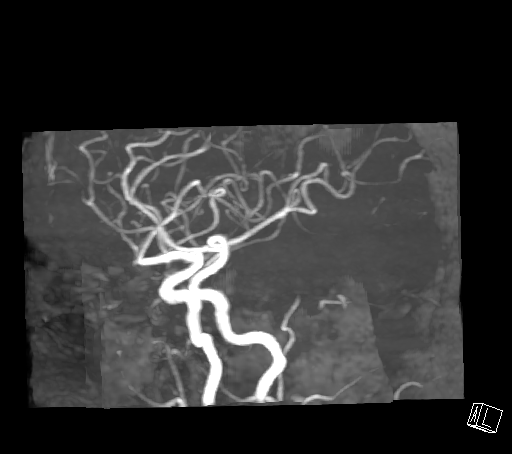

[Series 1035: tumble · 0.34mm/px · 2 of 19 slices shown]
[im 1/19]
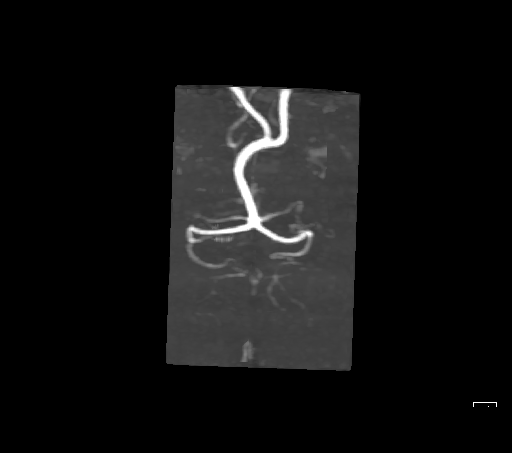
[im 19/19]
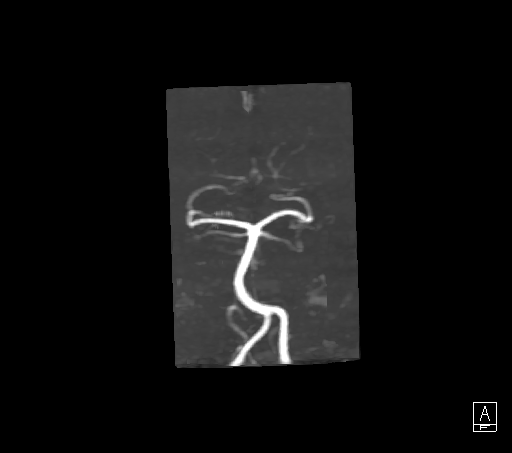

[Series 1039: rotate · 0.34mm/px · 2 of 19 slices shown (3 of 3)]
[im 1/19]
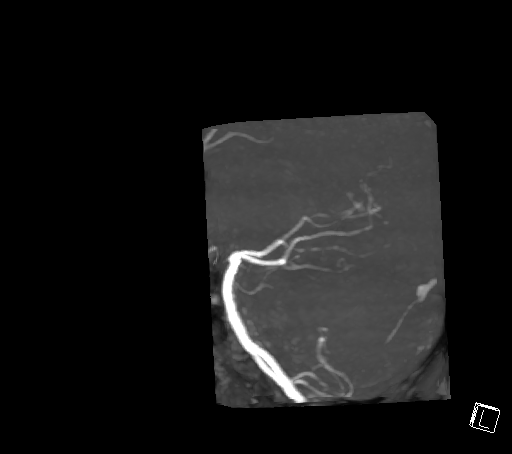
[im 19/19]
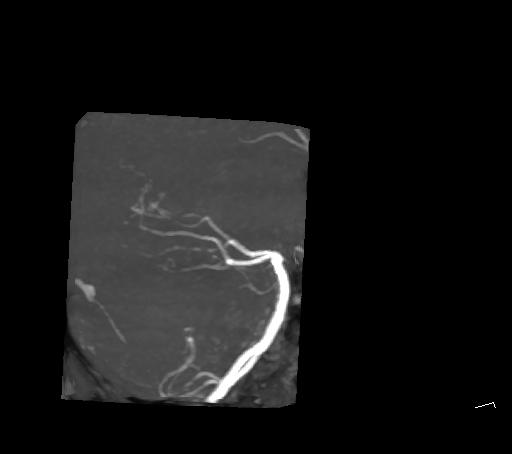

[27 of 48 positions shown; findings below may reference images not displayed]

FINDINGS: MRI HEAD FINDINGS

The patient was unable to tolerate the full examination. As a
result, only axial and coronal diffusion-weighted imaging, and a
sagittal T1 weighted sequence, could be obtained.

There is a 3 mm focus of restricted diffusion within the medial
right temporal lobe/hippocampus, compatible with acute infarct.

No acute infarct is identified elsewhere within the brain.

MRA HEAD FINDINGS

Anterior circulation:

The intracranial internal carotid arteries are patent. The M1 middle
cerebral arteries are patent. No M2 proximal branch occlusion or
high-grade proximal stenosis is identified. The anterior cerebral
arteries are patent.

1-2 mm inferomedially projecting vascular protrusion arising from
the distal cavernous left ICA, which may reflect an aneurysm or
infundibulum (for instance as seen on series 9, image 86).

Posterior circulation:

The intracranial vertebral arteries are patent. The basilar artery
is patent. The posterior cerebral arteries are patent. Posterior
communicating arteries are hypoplastic or absent bilaterally.

Anatomic variants: As described.
IMPRESSION: MRI brain:

1. The patient was unable to tolerate the full examination. As a
result, only axial and coronal diffusion-weighted imaging, and a
sagittal T1 weighted sequence, could be obtained.
2. 3 mm focus of restricted diffusion within the medial right
temporal lobe/hippocampus, compatible with acute infarction.

MRA head:

1. No intracranial large vessel occlusion or proximal high-grade
arterial stenosis.
2. 1-2 mm inferomedially projecting vascular protrusion arising from
the distal cavernous left ICA, which may reflect an aneurysm or
infundibulum.

## 2020-08-13 MED ORDER — ACETAMINOPHEN 650 MG RE SUPP: 650.0000 mg | RECTAL | Status: DC | PRN

## 2020-08-13 MED ORDER — ALBUTEROL SULFATE HFA 108 (90 BASE) MCG/ACT IN AERS: 1.0000 | INHALATION_SPRAY | Freq: Four times a day (QID) | RESPIRATORY_TRACT | Status: DC | PRN

## 2020-08-13 MED ORDER — HYDROXYZINE HCL 25 MG PO TABS: 50.0000 mg | ORAL_TABLET | Freq: Three times a day (TID) | ORAL | Status: DC | PRN

## 2020-08-13 MED ORDER — STROKE: EARLY STAGES OF RECOVERY BOOK: Freq: Once | Status: AC

## 2020-08-13 MED ORDER — SENNOSIDES-DOCUSATE SODIUM 8.6-50 MG PO TABS: 1.0000 | ORAL_TABLET | Freq: Every evening | ORAL | Status: DC | PRN

## 2020-08-13 MED ORDER — ASPIRIN 81 MG PO CHEW
81.0000 mg | CHEWABLE_TABLET | Freq: Every day | ORAL | Status: DC
  Administered 2020-08-13 – 2020-08-14 (×2): 81 mg via ORAL
  Filled 2020-08-13 (×2): qty 1

## 2020-08-13 MED ORDER — ALBUTEROL SULFATE (2.5 MG/3ML) 0.083% IN NEBU: 2.5000 mg | INHALATION_SOLUTION | Freq: Four times a day (QID) | RESPIRATORY_TRACT | Status: DC | PRN

## 2020-08-13 MED ORDER — HEPARIN SODIUM (PORCINE) 5000 UNIT/ML IJ SOLN
5000.0000 [IU] | Freq: Three times a day (TID) | INTRAMUSCULAR | Status: DC
  Administered 2020-08-13 (×3): 5000 [IU] via SUBCUTANEOUS
  Filled 2020-08-13 (×4): qty 1

## 2020-08-13 MED ORDER — ATORVASTATIN CALCIUM 40 MG PO TABS
40.0000 mg | ORAL_TABLET | Freq: Every day | ORAL | Status: DC
  Administered 2020-08-13 – 2020-08-14 (×2): 40 mg via ORAL
  Filled 2020-08-13 (×2): qty 1

## 2020-08-13 MED ORDER — ACETAMINOPHEN 160 MG/5ML PO SOLN: 650.0000 mg | ORAL | Status: DC | PRN

## 2020-08-13 MED ORDER — ACETAMINOPHEN 325 MG PO TABS: 650.0000 mg | ORAL_TABLET | ORAL | Status: DC | PRN

## 2020-08-13 MED ORDER — ALPRAZOLAM 0.25 MG PO TABS
0.2500 mg | ORAL_TABLET | Freq: Three times a day (TID) | ORAL | Status: DC
  Filled 2020-08-13: qty 1

## 2020-08-13 MED ORDER — SODIUM CHLORIDE 0.9 % IV SOLN: INTRAVENOUS | Status: DC

## 2020-08-13 MED ORDER — LORAZEPAM 1 MG PO TABS: 1.0000 mg | ORAL_TABLET | ORAL | Status: DC | PRN

## 2020-08-13 MED ORDER — ALPRAZOLAM 0.5 MG PO TABS
0.5000 mg | ORAL_TABLET | Freq: Three times a day (TID) | ORAL | Status: DC
  Administered 2020-08-13 (×2): 0.5 mg via ORAL
  Filled 2020-08-13 (×2): qty 1

## 2020-08-13 NOTE — Evaluation (Signed)
Occupational Therapy Evaluation Patient Details Name: Marissa [name] MRN: [id number] DOB: 19-Feb-1980 Today's Date: 08/13/2020    History of Present Illness Marissa [name]  is a 40 y.o. female,with anxiety, CVA, HLD, vit D deficiency presents to the ED with a chief complaint of whole body paresthesias. While patient was in the ED she developed difficulty speaking, so she was not able to provide me with history. She did point at her right chest when I asked if she was in any pain. She had a notepad at bedside and via small notes reports that she is not able to speak, but then she proceeds to whisper. She is able to speak clearly, and with normal language, but without voice, just whispering. She had right arm jerking that started at the same time as the whispering. Code stroke was called in the ED. She had a negative head CT. Neuro evaluated her and offered her TPA. Patient declined. Neuro Recommended admission for TIA/CVA workup. 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 Patient shakes her head no to radiating pain. She shakes her head no to dyspnea, cough, fever. Patient writes that she has a history of anxiety. No other complaints at this time.   Clinical Impression   Pt agreeable to OT/PT co-evaluation. Pt appears to be at or near baseline for UE function and functional mobility. Pt able to ambulate in hall independently. Pt demonstrated mild R UE tremble during strength assessment. Pt reports this is typical of her panic attacks but reported she was not currently having a panic attack. Pt is not recommended for further acute OT services and will be discharged to care of nursing staff for the remainder of her stay.      Follow Up Recommendations  No OT follow up    Equipment Recommendations  None recommended by OT           Precautions / Restrictions Precautions Precautions: None Restrictions Weight Bearing  Restrictions: No      Mobility Bed Mobility Overal bed mobility: Independent                  Transfers Overall transfer level: Independent Equipment used: None                  Balance Overall balance assessment: Independent                                         ADL either performed or assessed with clinical judgement   ADL Overall ADL's : Independent                                             Vision Baseline Vision/History: Wears glasses Wears Glasses:  (Wears glasses at night.) Patient Visual Report: Blurring of vision (Pt repored that she had difficulty focusing while in the care prior to hospitalization but that this symptom has resolved.) Vision Assessment?: No apparent visual deficits                Pertinent Vitals/Pain Pain Assessment: No/denies pain     Hand Dominance Right   Extremity/Trunk Assessment Upper Extremity Assessment Upper Extremity Assessment: Overall WFL for tasks assessed (Pt did report that R UE  felt "dull" and L UE felt "scratchy" during light touch with eyes closed. 4+/5 MMT grossly. R arm noted to tremble at times during strength testing. PT reports this happens during panic attacks.)   Lower Extremity Assessment Lower Extremity Assessment: Defer to PT evaluation   Cervical / Trunk Assessment Cervical / Trunk Assessment: Normal   Communication Communication Communication: No difficulties   Cognition Arousal/Alertness: Awake/alert Behavior During Therapy: WFL for tasks assessed/performed Overall Cognitive Status: Within Functional Limits for tasks assessed                                                      Home Living Family/patient expects to be discharged to:: Private residence Living Arrangements: Spouse/significant other;Children Available Help at Discharge: Family;Available 24 hours/day Type of Home: Apartment Home Access: Stairs to  enter Entrance Stairs-Number of Steps: 12 Entrance Stairs-Rails: Right Home Layout: One level     Bathroom Shower/Tub: Tub/shower unit;Walk-in shower   Bathroom Toilet: Standard     Home Equipment: Grab bars - tub/shower   Additional Comments: Husband can be available 24/7. Mother PRN. Pt cares for 3 children.      Prior Functioning/Environment Level of Independence: Independent                               OT Goals(Current goals can be found in the care plan section) Acute Rehab OT Goals Patient Stated Goal: return home                   Co-evaluation PT/OT/SLP Co-Evaluation/Treatment: Yes Reason for Co-Treatment: To address functional/ADL transfers   OT goals addressed during session: ADL's and self-care;Strengthening/ROM      AM-PAC OT "6 Clicks" Daily Activity     Outcome Measure Help from another person eating meals?: None Help from another person taking care of personal grooming?: None Help from another person toileting, which includes using toliet, bedpan, or urinal?: None Help from another person bathing (including washing, rinsing, drying)?: None Help from another person to put on and taking off regular upper body clothing?: None Help from another person to put on and taking off regular lower body clothing?: None 6 Click Score: 24   End of Session    Activity Tolerance: Patient tolerated treatment well Patient left: in chair;with call bell/phone within reach  OT Visit Diagnosis: Other symptoms and signs involving cognitive function                Time: 4081-4481 OT Time Calculation (min): 23 min Charges:  OT General Charges $OT Visit: 1 Visit OT Evaluation $OT Eval Low Complexity: 1 Low  Shamia Uppal OT, MOT  [employer] 08/13/2020, 10:08 AM

## 2020-08-13 NOTE — Progress Notes (Signed)
TRH H&P    Patient Demographics:    Marissa [name], is a 40 y.o. female  MRN: [id number]  DOB - 18-Jun-1980  Admit Date - 08/12/2020  Referring MD/NP/PA: Estell Harpin  Outpatient Primary MD for the patient is Heather Roberts, NP  Patient coming from: home  Chief complaint- tingling and difficulty speaking   HPI:    Marissa [name]  is a 40 y.o. female,with anxiety, CVA, HLD, vit D deficiency presents to the ED with a chief complaint of whole body paresthesias. While patient was in the ED she developed difficulty speaking, so she was not able to provide me with history. She did point at her right chest when I asked if she was in any pain. She had a notepad at bedside and via small notes reports that she is not able to speak, but then she proceeds to whisper. She is able to speak clearly, and with normal language, but without voice, just whispering. She had right arm jerking that started at the same time as the whispering. Code stroke was called in the ED. She had a negative head CT. Neuro evaluated her and offered her TPA. Patient declined. Neuro Recommended admission for TIA/CVA workup. 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 Patient shakes her head no to radiating pain. She shakes her head no to dyspnea, cough, fever. Patient writes that she has a history of anxiety. No other complaints at this time.   In the ED T 99.4, HR 70 -95, R 13 - 20, BP 108/72 99% WBC 4.9, hgb 11.9 Chemistry panel is unremarkable Neg preg  Covid negative UA negative UDS negative EtOH negative Code stroke called - as above EKG = HR 80, SR, Qtc 431 Tpa declined Neuro advised starting aspirin    Assessment & Plan:    Active Problems:   TIA (transient ischemic attack)   Possible TIA Ruled out CVA, TIA work-up in progress No focal neurological findings,  patient is cooperative Declined TPA CT head negative MRI/MRA negative with exception of 1 to 2 mm protrusion possible aneurysm -if there was an old infarct did not show on MRI Echo >>>  US carotids >> study no signs of occlusion Continue aspirin, statins SP/OT/PT eval and treat Monitor on tele for next 24 hours Most likely symptoms are secondary functional neurologic system disorder/conversion disorder   Possible conversion disorder,   TTS has been consulted appreciate input Anxiety Start vistaril PRN As needed Xanax ordered, patient has refused HLD Lipid panel, total cholesterol 162, LDL 88 Continue statin    DVT Prophylaxis-   heparin - SCDs     Family Communication: Admission, patients condition and plan of care including tests being ordered have been discussed with the patient and mother, over the phone, who indicate understanding and agree with the plan and Code Status.  Code Status:  full  Admission status: Observation      Home Medications:   Prior to Admission medications   Medication Sig Start  Date End Date Taking? Authorizing Provider  albuterol (VENTOLIN HFA) 108 (90 Base) MCG/ACT inhaler Inhale 1-2 puffs into the lungs every 6 (six) hours as needed for wheezing or shortness of breath. 09/24/19   Wurst, [place], PA-C  aspirin 81 MG chewable tablet Chew 81 mg by mouth once as needed.    [provider]  benzonatate (TESSALON) 100 MG capsule Take 1 capsule (100 mg total) by mouth 3 (three) times daily as needed for cough. 06/28/20   Lamptey, Britta Mccreedy, MD  ibuprofen (ADVIL,MOTRIN) 600 MG tablet Take 1 tablet (600 mg total) by mouth every 6 (six) hours as needed. 09/11/17   Eber Hong, MD  promethazine-dextromethorphan (PROMETHAZINE-DM) 6.25-15 MG/5ML syrup Take 2.5 mLs by mouth 2 (two) times daily as needed for cough. 02/02/20   Freddy Finner, NP  Vitamin D, Ergocalciferol, (DRISDOL) 1.25 MG (50000 UNIT) CAPS capsule Take 1 capsule (50,000 Units total) by  mouth every 7 (seven) days. 09/08/19   Freddy Finner, NP     Allergies:     Allergies  Allergen Reactions   Triptans     Cannot use triptans for migraine per neurology due to stroke history   Morphine And Related Hives    Feels like skin is burning      Physical Exam:   Vitals  Blood pressure 115/71, pulse 70, temperature 98.2 F (36.8 C), temperature source Oral, resp. rate 18, height  (1.575 m), weight 79.8 kg, SpO2 100 %.    Physical Exam:   General:  Alert, oriented, cooperative, no distress;   HEENT:  Normocephalic, PERRL, otherwise with in Normal limits   Neuro:  CNII-XII intact. , normal motor and sensation, reflexes intact   Lungs:   Clear to auscultation BL, Respirations unlabored, no wheezes / crackles  Cardio:    S1/S2, RRR, No murmure, No Rubs or Gallops   Abdomen:   Soft, non-tender, bowel sounds active all four quadrants,  no guarding or peritoneal signs.  Muscular skeletal:  Limited exam - in bed, able to move all 4 extremities, Normal strength,  2+ pulses,  symmetric, No pitting edema  Skin:  Dry, warm to touch, negative for any Rashes,  Wounds: Please see nursing documentation           Data Review:    CBC Recent Labs  Lab 08/12/20 2120  WBC 4.9  HGB 11.9*  HCT 35.5*  PLT 206  MCV 96.7  MCH 32.4  MCHC 33.5  RDW 13.3  LYMPHSABS 2.0  MONOABS 0.3  EOSABS 0.1  BASOSABS 0.0   ------------------------------------------------------------------------------------------------------------------  Results for orders placed or performed during the hospital encounter of 08/12/20 (from the past 48 hour(s))  CBC with Differential/Platelet     Status: Abnormal   Collection Time: 08/12/20  9:20 PM  Result Value Ref Range   WBC 4.9 4.0 - 10.5 K/uL   RBC 3.67 (L) 3.87 - 5.11 MIL/uL   Hemoglobin 11.9 (L) 12.0 - 15.0 g/dL   HCT 16.1 (L) 09.6 - 04.5 %   MCV 96.7 80.0 - 100.0 fL   MCH 32.4 26.0 - 34.0 pg   MCHC 33.5 30.0 - 36.0 g/dL   RDW 40.9  81.1 - 91.4 %   Platelets 206 150 - 400 K/uL   nRBC 0.0 0.0 - 0.2 %   Neutrophils Relative % 50 %   Neutro Abs 2.4 1.7 - 7.7 K/uL   Lymphocytes Relative 41 %   Lymphs Abs 2.0 0.7 - 4.0 K/uL  Monocytes Relative 7 %   Monocytes Absolute 0.3 0.1 - 1.0 K/uL   Eosinophils Relative 2 %   Eosinophils Absolute 0.1 0.0 - 0.5 K/uL   Basophils Relative 0 %   Basophils Absolute 0.0 0.0 - 0.1 K/uL   Immature Granulocytes 0 %   Abs Immature Granulocytes 0.01 0.00 - 0.07 K/uL    Comment: Performed at [facility], [address], [place], [place] [address]  Comprehensive metabolic panel     Status: Abnormal   Collection Time: 08/12/20  9:20 PM  Result Value Ref Range   Sodium 136 135 - 145 mmol/L   Potassium 3.5 3.5 - 5.1 mmol/L   Chloride 105 98 - 111 mmol/L   CO2 28 22 - 32 mmol/L   Glucose, Bld 96 70 - 99 mg/dL    Comment: Glucose reference range applies only to samples taken after fasting for at least 8 hours.   BUN 14 6 - 20 mg/dL   Creatinine, Ser 9.62 0.44 - 1.00 mg/dL   Calcium 8.7 (L) 8.9 - 10.3 mg/dL   Total Protein 6.7 6.5 - 8.1 g/dL   Albumin 3.8 3.5 - 5.0 g/dL   AST 23 15 - 41 U/L   ALT 24 0 - 44 U/L   Alkaline Phosphatase 48 38 - 126 U/L   Total Bilirubin 0.8 0.3 - 1.2 mg/dL   GFR, Estimated >22 >97 mL/min    Comment: (NOTE) Calculated using the CKD-EPI Creatinine Equation (2021)    Anion gap 3 (L) 5 - 15    Comment: Performed at [facility], [address] [place], [place] [address]  Ethanol     Status: None   Collection Time: 08/12/20  9:20 PM  Result Value Ref Range   Alcohol, Ethyl (B) <10 <10 mg/dL    Comment: (NOTE) Lowest detectable limit for serum alcohol is 10 mg/dL.  For medical purposes only. Performed at [facility], [address] [place], [place] [address]   Protime-INR     Status: None   Collection Time: 08/12/20  9:20 PM  Result Value Ref Range   Prothrombin Time 13.1 11.4 - 15.2 seconds   INR 1.0 0.8 - 1.2    Comment: (NOTE) INR goal varies  based on device and disease states. Performed at [facility], [address], [place], [place] [address]   APTT     Status: None   Collection Time: 08/12/20  9:20 PM  Result Value Ref Range   aPTT 29 24 - 36 seconds    Comment: Performed at [facility], [address] [place], [place] [address]  CBG monitoring, ED     Status: None   Collection Time: 08/12/20  9:47 PM  Result Value Ref Range   Glucose-Capillary 71 70 - 99 mg/dL    Comment: Glucose reference range applies only to samples taken after fasting for at least 8 hours.  Resp Panel by RT-PCR (Flu A&B, Covid) Nasopharyngeal Swab     Status: None   Collection Time: 08/12/20 10:00 PM   Specimen: Nasopharyngeal Swab; Nasopharyngeal(NP) swabs in vial transport medium  Result Value Ref Range   SARS Coronavirus 2 by RT PCR NEGATIVE NEGATIVE    Comment: (NOTE) SARS-CoV-2 target nucleic acids are NOT DETECTED.  The SARS-CoV-2 RNA is generally detectable in upper respiratory specimens during the acute phase of infection. The lowest concentration of SARS-CoV-2 viral copies this assay can detect is 138 copies/mL. A negative result does not preclude SARS-Cov-2 infection and should not be  used as the sole basis for treatment or other patient management decisions. A negative result may occur with  improper specimen collection/handling, submission of specimen other than nasopharyngeal swab, presence of viral mutation(s) within the areas targeted by this assay, and inadequate number of viral copies(<138 copies/mL). A negative result must be combined with clinical observations, patient history, and epidemiological information. The expected result is Negative.  Fact Sheet for Patients:  BloggerCourse.com  Fact Sheet for Healthcare Providers:  SeriousBroker.it  This test is no t yet approved or cleared by the [place] FDA and  has been authorized for detection and/or diagnosis of  SARS-CoV-2 by FDA under an Emergency Use Authorization (EUA). This EUA will remain  in effect (meaning this test can be used) for the duration of the COVID-19 declaration under Section 564(b)(1) of the Act, 21 U.S.C.section 360bbb-3(b)(1), unless the authorization is terminated  or revoked sooner.       Influenza A by PCR NEGATIVE NEGATIVE   Influenza B by PCR NEGATIVE NEGATIVE    Comment: (NOTE) The Xpert Xpress SARS-CoV-2/FLU/RSV plus assay is intended as an aid in the diagnosis of influenza from Nasopharyngeal swab specimens and should not be used as a sole basis for treatment. Nasal washings and aspirates are unacceptable for Xpert Xpress SARS-CoV-2/FLU/RSV testing.  Fact Sheet for Patients: BloggerCourse.com  Fact Sheet for Healthcare Providers: SeriousBroker.it  This test is not yet approved or cleared by the [place] FDA and has been authorized for detection and/or diagnosis of SARS-CoV-2 by FDA under an Emergency Use Authorization (EUA). This EUA will remain in effect (meaning this test can be used) for the duration of the COVID-19 declaration under Section 564(b)(1) of the Act, 21 U.S.C. section 360bbb-3(b)(1), unless the authorization is terminated or revoked.  Performed at [facility], [address], [place], [place] [address]   Urine rapid drug screen (hosp performed)     Status: None   Collection Time: 08/12/20 10:00 PM  Result Value Ref Range   Opiates NONE DETECTED NONE DETECTED   Cocaine NONE DETECTED NONE DETECTED   Benzodiazepines NONE DETECTED NONE DETECTED   Amphetamines NONE DETECTED NONE DETECTED   Tetrahydrocannabinol NONE DETECTED NONE DETECTED   Barbiturates NONE DETECTED NONE DETECTED    Comment: (NOTE) DRUG SCREEN FOR MEDICAL PURPOSES ONLY.  IF CONFIRMATION IS NEEDED FOR ANY PURPOSE, NOTIFY LAB WITHIN 5 DAYS.  LOWEST DETECTABLE LIMITS FOR URINE DRUG SCREEN Drug Class                      Cutoff (ng/mL) Amphetamine and metabolites    1000 Barbiturate and metabolites    200 Benzodiazepine                 200 Tricyclics and metabolites     300 Opiates and metabolites        300 Cocaine and metabolites        300 THC                            50 Performed at [facility], [address], [place], [place] [address]   Urinalysis, Routine w reflex microscopic Urine, Clean Catch     Status: None   Collection Time: 08/12/20 10:00 PM  Result Value Ref Range   Color, Urine YELLOW YELLOW   APPearance CLEAR CLEAR   Specific Gravity, Urine 1.020 1.005 - 1.030   pH 7.0 5.0 - 8.0   Glucose, UA  NEGATIVE NEGATIVE mg/dL   Hgb urine dipstick NEGATIVE NEGATIVE   Bilirubin Urine NEGATIVE NEGATIVE   Ketones, ur NEGATIVE NEGATIVE mg/dL   Protein, ur NEGATIVE NEGATIVE mg/dL   Nitrite NEGATIVE NEGATIVE   Leukocytes,Ua NEGATIVE NEGATIVE    Comment: Performed at [facility], [address] [place], [place] [address]  Pregnancy, urine     Status: None   Collection Time: 08/12/20 10:00 PM  Result Value Ref Range   Preg Test, Ur NEGATIVE NEGATIVE    Comment:        THE SENSITIVITY OF THIS METHODOLOGY IS >20 mIU/mL. Performed at [facility], [address], [place], [place] [address]   Troponin I (High Sensitivity)     Status: None   Collection Time: 08/13/20  4:13 AM  Result Value Ref Range   Troponin I (High Sensitivity) 2 <18 ng/L    Comment: (NOTE) Elevated high sensitivity troponin I (hsTnI) values and significant  changes across serial measurements may suggest ACS but many other  chronic and acute conditions are known to elevate hsTnI results.  Refer to the "Links" section for chest pain algorithms and additional  guidance. Performed at [facility], [address], [place], [place] [address]   HIV Antibody (routine testing w rflx)     Status: None   Collection Time: 08/13/20  4:13 AM  Result Value Ref Range   HIV Screen 4th Generation wRfx Non Reactive Non Reactive     Comment: Performed at [facility], [address] [place], [place] [address]  Lipid panel     Status: None   Collection Time: 08/13/20  4:14 AM  Result Value Ref Range   Cholesterol 162 0 - 200 mg/dL   Triglycerides 32 <295 mg/dL   HDL 68 >62 mg/dL   Total CHOL/HDL Ratio 2.4 RATIO   VLDL 6 0 - 40 mg/dL   LDL Cholesterol 88 0 - 99 mg/dL    Comment:        Total Cholesterol/HDL:CHD Risk Coronary Heart Disease Risk Table                     Men   Women  1/2 Average Risk   3.4   3.3  Average Risk       5.0   4.4  2 X Average Risk   9.6   7.1  3 X Average Risk  23.4   11.0        Use the calculated Patient Ratio above and the CHD Risk Table to determine the patient's CHD Risk.        ATP III CLASSIFICATION (LDL):  <100     mg/dL   Optimal  130-865  mg/dL   Near or Above                    Optimal  130-159  mg/dL   Borderline  784-696  mg/dL   High  >295     mg/dL   Very High Performed at [facility], [address], [place], [place] [address]   Troponin I (High Sensitivity)     Status: None   Collection Time: 08/13/20  6:33 AM  Result Value Ref Range   Troponin I (High Sensitivity) 2 <18 ng/L    Comment: (NOTE) Elevated high sensitivity troponin I (hsTnI) values and significant  changes across serial measurements may suggest ACS but many other  chronic and acute conditions are known to elevate hsTnI results.  Refer to  the "Links" section for chest pain algorithms and additional  guidance. Performed at [facility], [address] [place], [place] [address]     Chemistries  Recent Labs  Lab 08/12/20 2120  NA 136  K 3.5  CL 105  CO2 28  GLUCOSE 96  BUN 14  CREATININE 0.86  CALCIUM 8.7*  AST 23  ALT 24  ALKPHOS 48  BILITOT 0.8    ------------------------------------------------------------------------------------------------------------------  ------------------------------------------------------------------------------------------------------------------ GFR: Estimated Creatinine Clearance: 85.1 mL/min (by C-G formula based on SCr of 0.86 mg/dL). Liver Function Tests: Recent Labs  Lab 08/12/20 2120  AST 23  ALT 24  ALKPHOS 48  BILITOT 0.8  PROT 6.7  ALBUMIN 3.8   No results for input(s): LIPASE, AMYLASE in the last 168 hours. No results for input(s): AMMONIA in the last 168 hours. Coagulation Profile: Recent Labs  Lab 08/12/20 2120  INR 1.0   Cardiac Enzymes: No results for input(s): CKTOTAL, CKMB, CKMBINDEX, TROPONINI in the last 168 hours. BNP (last 3 results) No results for input(s): PROBNP in the last 8760 hours. HbA1C: No results for input(s): HGBA1C in the last 72 hours. CBG: Recent Labs  Lab 08/12/20 2147  GLUCAP 71   Lipid Profile: Recent Labs    08/13/20 0414  CHOL 162  HDL 68  LDLCALC 88  TRIG 32  CHOLHDL 2.4   Thyroid Function Tests: No results for input(s): TSH, T4TOTAL, FREET4, T3FREE, THYROIDAB in the last 72 hours. Anemia Panel: No results for input(s): VITAMINB12, FOLATE, FERRITIN, TIBC, IRON, RETICCTPCT in the last 72 hours.  --------------------------------------------------------------------------------------------------------------- Urine analysis:    Component Value Date/Time   COLORURINE YELLOW 08/12/2020 2200   APPEARANCEUR CLEAR 08/12/2020 2200   LABSPEC 1.020 08/12/2020 2200   PHURINE 7.0 08/12/2020 2200   GLUCOSEU NEGATIVE 08/12/2020 2200   HGBUR NEGATIVE 08/12/2020 2200   BILIRUBINUR NEGATIVE 08/12/2020 2200   KETONESUR NEGATIVE 08/12/2020 2200   PROTEINUR NEGATIVE 08/12/2020 2200   UROBILINOGEN 0.2 06/23/2010 1457   NITRITE NEGATIVE 08/12/2020 2200   LEUKOCYTESUR NEGATIVE 08/12/2020 2200      Imaging Results:    MR ANGIO HEAD WO  CONTRAST  Result Date: 08/13/2020 CLINICAL DATA:  Neuro deficit, acute, stroke suspected. Additional history provided: Patient reports bilateral weakness for 2 days. EXAM: MRI HEAD WITHOUT CONTRAST MRA HEAD WITHOUT CONTRAST TECHNIQUE: Multiplanar, multi-echo pulse sequences of the brain and surrounding structures were acquired without intravenous contrast. Angiographic images of the Circle of Willis were acquired using MRA technique without intravenous contrast. COMPARISON:  Prior non-contrast head CT examinations 08/12/2020 and earlier. FINDINGS: MRI HEAD FINDINGS The patient was unable to tolerate the full examination. As a result, only axial and coronal diffusion-weighted imaging, and a sagittal T1 weighted sequence, could be obtained. There is a 3 mm focus of restricted diffusion within the medial right temporal lobe/hippocampus, compatible with acute infarct. No acute infarct is identified elsewhere within the brain. MRA HEAD FINDINGS Anterior circulation: The intracranial internal carotid arteries are patent. The M1 middle cerebral arteries are patent. No M2 proximal branch occlusion or high-grade proximal stenosis is identified. The anterior cerebral arteries are patent. 1-2 mm inferomedially projecting vascular protrusion arising from the distal cavernous left ICA, which may reflect an aneurysm or infundibulum (for instance as seen on series 9, image 86). Posterior circulation: The intracranial vertebral arteries are patent. The basilar artery is patent. The posterior cerebral arteries are patent. Posterior communicating arteries are hypoplastic or absent bilaterally. Anatomic variants: As described. IMPRESSION: MRI brain: 1. The patient was unable to  tolerate the full examination. As a result, only axial and coronal diffusion-weighted imaging, and a sagittal T1 weighted sequence, could be obtained. 2. 3 mm focus of restricted diffusion within the medial right temporal lobe/hippocampus, compatible with  acute infarction. MRA head: 1. No intracranial large vessel occlusion or proximal high-grade arterial stenosis. 2. 1-2 mm inferomedially projecting vascular protrusion arising from the distal cavernous left ICA, which may reflect an aneurysm or infundibulum. Electronically Signed   By: Jackey LogeKyle  Golden DO   On: 08/13/2020 09:35   MR BRAIN WO CONTRAST  Result Date: 08/13/2020 CLINICAL DATA:  Neuro deficit, acute, stroke suspected. Additional history provided: Patient reports bilateral weakness for 2 days. EXAM: MRI HEAD WITHOUT CONTRAST MRA HEAD WITHOUT CONTRAST TECHNIQUE: Multiplanar, multi-echo pulse sequences of the brain and surrounding structures were acquired without intravenous contrast. Angiographic images of the Circle of Willis were acquired using MRA technique without intravenous contrast. COMPARISON:  Prior non-contrast head CT examinations 08/12/2020 and earlier. FINDINGS: MRI HEAD FINDINGS The patient was unable to tolerate the full examination. As a result, only axial and coronal diffusion-weighted imaging, and a sagittal T1 weighted sequence, could be obtained. There is a 3 mm focus of restricted diffusion within the medial right temporal lobe/hippocampus, compatible with acute infarct. No acute infarct is identified elsewhere within the brain. MRA HEAD FINDINGS Anterior circulation: The intracranial internal carotid arteries are patent. The M1 middle cerebral arteries are patent. No M2 proximal branch occlusion or high-grade proximal stenosis is identified. The anterior cerebral arteries are patent. 1-2 mm inferomedially projecting vascular protrusion arising from the distal cavernous left ICA, which may reflect an aneurysm or infundibulum (for instance as seen on series 9, image 86). Posterior circulation: The intracranial vertebral arteries are patent. The basilar artery is patent. The posterior cerebral arteries are patent. Posterior communicating arteries are hypoplastic or absent bilaterally.  Anatomic variants: As described. IMPRESSION: MRI brain: 1. The patient was unable to tolerate the full examination. As a result, only axial and coronal diffusion-weighted imaging, and a sagittal T1 weighted sequence, could be obtained. 2. 3 mm focus of restricted diffusion within the medial right temporal lobe/hippocampus, compatible with acute infarction. MRA head: 1. No intracranial large vessel occlusion or proximal high-grade arterial stenosis. 2. 1-2 mm inferomedially projecting vascular protrusion arising from the distal cavernous left ICA, which may reflect an aneurysm or infundibulum. Electronically Signed   By: Jackey LogeKyle  Golden DO   On: 08/13/2020 09:35   US Carotid [facility] and [facility] only)  Result Date: 08/13/2020 CLINICAL DATA:  Right arm tingling EXAM: BILATERAL CAROTID DUPLEX ULTRASOUND TECHNIQUE: Wallace CullensGray scale imaging, color Doppler and duplex ultrasound were performed of bilateral carotid and vertebral arteries in the neck. COMPARISON:  None. FINDINGS: Criteria: Quantification of carotid stenosis is based on velocity parameters that correlate the residual internal carotid diameter with NASCET-based stenosis levels, using the diameter of the distal internal carotid lumen as the denominator for stenosis measurement. The following velocity measurements were obtained: RIGHT ICA: 103/38 cm/sec CCA: 84/23 cm/sec SYSTOLIC ICA/CCA RATIO:  1.2 ECA:  55 cm/sec LEFT ICA: 92/42 cm/sec CCA: 93/27 cm/sec SYSTOLIC ICA/CCA RATIO:  1.0 ECA:  51 cm/sec RIGHT CAROTID ARTERY: No significant atherosclerotic plaque or evidence of stenosis in the internal carotid artery. RIGHT VERTEBRAL ARTERY:  Patent with normal antegrade flow. LEFT CAROTID ARTERY: No significant atherosclerotic plaque or evidence of stenosis in the internal carotid artery. LEFT VERTEBRAL ARTERY:  Patent with normal antegrade flow. IMPRESSION: Normal bilateral carotid duplex ultrasound. Signed, Kandis CockingHeath K.  Archer Asa, MD, RPVI [facility] Radiology Electronically Signed   By: Malachy Moan M.D.   On: 08/13/2020 09:10   CT HEAD CODE STROKE WO CONTRAST  Result Date: 08/12/2020 CLINICAL DATA:  Code stroke. Acute anxiety with feeling of tingling all over EXAM: CT HEAD WITHOUT CONTRAST TECHNIQUE: Contiguous axial images were obtained from the base of the skull through the vertex without intravenous contrast. COMPARISON:  None. FINDINGS: Brain: There is no mass, hemorrhage or extra-axial collection. The size and configuration of the ventricles and extra-axial CSF spaces are normal. The brain parenchyma is normal, without evidence of acute or chronic infarction. Vascular: No abnormal hyperdensity of the major intracranial arteries or dural venous sinuses. No intracranial atherosclerosis. Skull: The visualized skull base, calvarium and extracranial soft tissues are normal. Sinuses/Orbits: No fluid levels or advanced mucosal thickening of the visualized paranasal sinuses. No mastoid or middle ear effusion. The orbits are normal. ASPECTS [facility] Stroke Program Early CT Score) - Ganglionic level infarction (caudate, lentiform nuclei, internal capsule, insula, M1-M3 cortex): 7 - Supraganglionic infarction (M4-M6 cortex): 3 Total score (0-10 with 10 being normal): 10 IMPRESSION: 1. Normal head CT. 2. ASPECTS is 10. Electronically Signed   By: Deatra Robinson M.D.   On: 08/12/2020 21:59    My personal review of EKG: Rhythm NSR, Rate 80 /min, QTc 431 ,no Acute ST changes  SIGNED: Kendell Bane, MD, FHM. [facility],  Pager (please use [email or web address] to page/text)  Please use Epic Secure Chat for non-urgent communication (7AM-7PM) If 7PM-7AM, please contact night-coverage [email or web address],  08/13/2020, 10:36 AM

## 2020-08-13 NOTE — BH Assessment (Signed)
Talked to Lesly Rubenstein, RN, pt's nurse. Pt is alert and oriented and able to speak. Concern is conversion disorder since pt has had stroke symptoms with no medical determination per RN. Asked to have TTS cart set up for assessment. Nurse stated she would have to check with Charge nurse to locate cart. RN agreed to [employer] when cart is in place so TTS assessment can be completed.   Mialee Weyman T. Jimmye Norman, MS, [facility], [facility] Triage Specialist [facility]

## 2020-08-13 NOTE — Progress Notes (Signed)
SLP Cancellation Note  Patient Details Name: Marissa [name] MRN: [id number] DOB: 07-21-1980   Cancelled treatment:       Reason Eval/Treat Not Completed: Patient at procedure or test/unavailable (Pt currently working with OT. Will check back later for SLE needs. Head CT negative for acute changes, MRI pending.)  Thank you,  Havery Moros, CCC-SLP [phone]  [name] 08/13/2020, 9:21 AM

## 2020-08-13 NOTE — H&P (Signed)
TRH H&P    Patient Demographics:    Marissa [name], is a 40 y.o. female  MRN: [id number]  DOB - 1980/07/10  Admit Date - 08/12/2020  Referring MD/NP/PA: Estell Harpin  Outpatient Primary MD for the patient is Heather Roberts, NP  Patient coming from: home  Chief complaint- tingling and difficulty speaking   HPI:    Marissa [name]  is a 40 y.o. female,with anxiety, CVA, HLD, vit D deficiency presents to the ED with a chief complaint of whole body paresthesias. While patient was in the ED she developed difficulty speaking, so she was not able to provide me with history. She did point at her right chest when I asked if she was in any pain. She had a notepad at bedside and via small notes reports that she is not able to speak, but then she proceeds to whisper. She is able to speak clearly, and with normal language, but without voice, just whispering. She had right arm jerking that started at the same time as the whispering. Code stroke was called in the ED. She had a negative head CT. Neuro evaluated her and offered her TPA. Patient declined. Neuro Recommended admission for TIA/CVA workup. 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 Patient shakes her head no to radiating pain. She shakes her head no to dyspnea, cough, fever. Patient writes that she has a history of anxiety. No other complaints at this time.   In the ED T 99.4, HR 70 -95, R 13 - 20, BP 108/72 99% WBC 4.9, hgb 11.9 Chemistry panel is unremarkable Neg preg  Covid negative UA negative UDS negative EtOH negative Code stroke called - as above EKG = HR 80, SR, Qtc 431 Tpa declined Neuro advised starting aspirin     Review of systems:    In addition to the HPI above,  No Fever-chills, No Headache, No changes with Vision or hearing, No problems swallowing food or Liquids, No  Chest pain, Cough or Shortness of Breath, No Abdominal pain, No Nausea or Vomiting, bowel movements are regular, No Blood in stool or Urine, No dysuria, No new skin rashes or bruises, No new joints pains-aches,  Admits to tingling whole body No recent weight gain or loss, No polyuria, polydypsia or polyphagia, No significant Mental Stressors.  All other systems reviewed and are negative.    Past History of the following :    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) 10/08/2015   R  frontal   Common migraine with intractable migraine 11/08/2015   Encounter for gynecological examination with Papanicolaou smear of cervix 12/30/2018   Hyperlipidemia    Stroke (HCC)    Stroke (HCC)    Vitamin D deficiency       Past Surgical History:  Procedure Laterality Date   CESAREAN SECTION     PATENT FORAMEN OVALE CLOSURE  09/25/2015   TUBAL LIGATION        Social History:      Social History   Tobacco Use   Smoking status: Never   Smokeless tobacco: Never  Substance Use Topics   Alcohol use: No       Family History :     Family History  Problem Relation Age of Onset   Hypertension Mother    Heart disease Maternal Uncle        murmur   Heart disease Maternal Grandmother    COPD Maternal Grandfather    Cancer Maternal Grandfather        lung   Heart disease Maternal Aunt        murmur   Migraines Neg Hx       Home Medications:   Prior to Admission medications   Medication Sig Start Date End Date Taking? Authorizing Provider  albuterol (VENTOLIN HFA) 108 (90 Base) MCG/ACT inhaler Inhale 1-2 puffs into the lungs every 6 (six) hours as needed for wheezing or shortness of breath. 09/24/19   Wurst, [place], PA-C  aspirin 81 MG chewable tablet Chew 81 mg by mouth once as needed.    [provider]  benzonatate (TESSALON) 100 MG capsule Take 1 capsule (100 mg total) by  mouth 3 (three) times daily as needed for cough. 06/28/20   Lamptey, Britta Mccreedy, MD  ibuprofen (ADVIL,MOTRIN) 600 MG tablet Take 1 tablet (600 mg total) by mouth every 6 (six) hours as needed. 09/11/17   Eber Hong, MD  promethazine-dextromethorphan (PROMETHAZINE-DM) 6.25-15 MG/5ML syrup Take 2.5 mLs by mouth 2 (two) times daily as needed for cough. 02/02/20   Freddy Finner, NP  Vitamin D, Ergocalciferol, (DRISDOL) 1.25 MG (50000 UNIT) CAPS capsule Take 1 capsule (50,000 Units total) by mouth every 7 (seven) days. 09/08/19   Freddy Finner, NP     Allergies:     Allergies  Allergen Reactions   Triptans     Cannot use triptans for migraine per neurology due to stroke history   Morphine And Related Hives    Feels like skin is burning      Physical Exam:   Vitals  Blood pressure 110/80, pulse 66, temperature 99.4 F (37.4 C), temperature source Oral, resp. rate 15, height 5\' 2"  (1.575 m), weight 77.1 kg, SpO2 99 %.  1.  General: Patient lying supine in bed,  no acute distress   2. Psychiatric: Alert and oriented x 3, mood is anxious, pleasant and cooperative with exam   3. Neurologic: Speech and language are normal, but only whispering, face is symmetric, tongue protrudes midline, moves all 4 extremities voluntarily, right arm tremor that is resolved with distraction,    4. HEENMT:  Head is atraumatic, normocephalic, pupils reactive to light, neck is supple, trachea is midline, mucous membranes are moist   5. Respiratory : Lungs are clear to auscultation bilaterally without wheezing, rhonchi, rales, no cyanosis, no increase in work of breathing or accessory muscle use   6. Cardiovascular : Heart rate normal, rhythm is regular, no murmurs, rubs or gallops, no peripheral edema, peripheral pulses palpated   7. Gastrointestinal:  Abdomen is soft, nondistended, nontender to palpation bowel sounds active, no masses or organomegaly palpated   8. Skin:  Skin is warm, dry and intact  without rashes, acute lesions, or ulcers on limited exam   9.Musculoskeletal:  No acute deformities or trauma, no asymmetry in tone, no peripheral edema, peripheral pulses palpated, no tenderness to palpation  in the extremities     Data Review:    CBC Recent Labs  Lab 08/12/20 2120  WBC 4.9  HGB 11.9*  HCT 35.5*  PLT 206  MCV 96.7  MCH 32.4  MCHC 33.5  RDW 13.3  LYMPHSABS 2.0  MONOABS 0.3  EOSABS 0.1  BASOSABS 0.0   ------------------------------------------------------------------------------------------------------------------  Results for orders placed or performed during the hospital encounter of 08/12/20 (from the past 48 hour(s))  CBC with Differential/Platelet     Status: Abnormal   Collection Time: 08/12/20  9:20 PM  Result Value Ref Range   WBC 4.9 4.0 - 10.5 K/uL   RBC 3.67 (L) 3.87 - 5.11 MIL/uL   Hemoglobin 11.9 (L) 12.0 - 15.0 g/dL   HCT 40.935.5 (L) 81.136.0 - 91.446.0 %   MCV 96.7 80.0 - 100.0 fL   MCH 32.4 26.0 - 34.0 pg   MCHC 33.5 30.0 - 36.0 g/dL   RDW 78.213.3 95.611.5 - 21.315.5 %   Platelets 206 150 - 400 K/uL   nRBC 0.0 0.0 - 0.2 %   Neutrophils Relative % 50 %   Neutro Abs 2.4 1.7 - 7.7 K/uL   Lymphocytes Relative 41 %   Lymphs Abs 2.0 0.7 - 4.0 K/uL   Monocytes Relative 7 %   Monocytes Absolute 0.3 0.1 - 1.0 K/uL   Eosinophils Relative 2 %   Eosinophils Absolute 0.1 0.0 - 0.5 K/uL   Basophils Relative 0 %   Basophils Absolute 0.0 0.0 - 0.1 K/uL   Immature Granulocytes 0 %   Abs Immature Granulocytes 0.01 0.00 - 0.07 K/uL    Comment: Performed at [facility], [address] [place], [place] [address]  Comprehensive metabolic panel     Status: Abnormal   Collection Time: 08/12/20  9:20 PM  Result Value Ref Range   Sodium 136 135 - 145 mmol/L   Potassium 3.5 3.5 - 5.1 mmol/L   Chloride 105 98 - 111 mmol/L   CO2 28 22 - 32 mmol/L   Glucose, Bld 96 70 - 99 mg/dL    Comment: Glucose reference range applies only to samples taken after fasting for at least 8  hours.   BUN 14 6 - 20 mg/dL   Creatinine, Ser 8.460.86 0.44 - 1.00 mg/dL   Calcium 8.7 (L) 8.9 - 10.3 mg/dL   Total Protein 6.7 6.5 - 8.1 g/dL   Albumin 3.8 3.5 - 5.0 g/dL   AST 23 15 - 41 U/L   ALT 24 0 - 44 U/L   Alkaline Phosphatase 48 38 - 126 U/L   Total Bilirubin 0.8 0.3 - 1.2 mg/dL   GFR, Estimated >96>60 >29>60 mL/min    Comment: (NOTE) Calculated using the CKD-EPI Creatinine Equation (2021)    Anion gap 3 (L) 5 - 15    Comment: Performed at [facility], [address] [place], [place] [address]  Ethanol     Status: None   Collection Time: 08/12/20  9:20 PM  Result Value Ref Range   Alcohol, Ethyl (B) <10 <10 mg/dL    Comment: (NOTE) Lowest detectable limit for serum alcohol is 10 mg/dL.  For medical purposes only. Performed at [facility], [address] [place], [place] [address]   Protime-INR     Status: None   Collection Time: 08/12/20  9:20 PM  Result Value Ref Range   Prothrombin Time 13.1 11.4 - 15.2 seconds   INR 1.0 0.8 - 1.2    Comment: (NOTE) INR goal varies based  on device and disease states. Performed at [facility], [address] [place], [place] [address]   APTT     Status: None   Collection Time: 08/12/20  9:20 PM  Result Value Ref Range   aPTT 29 24 - 36 seconds    Comment: Performed at [facility], [address], [place], [place] [address]  CBG monitoring, ED     Status: None   Collection Time: 08/12/20  9:47 PM  Result Value Ref Range   Glucose-Capillary 71 70 - 99 mg/dL    Comment: Glucose reference range applies only to samples taken after fasting for at least 8 hours.  Resp Panel by RT-PCR (Flu A&B, Covid) Nasopharyngeal Swab     Status: None   Collection Time: 08/12/20 10:00 PM   Specimen: Nasopharyngeal Swab; Nasopharyngeal(NP) swabs in vial transport medium  Result Value Ref Range   SARS Coronavirus 2 by RT PCR NEGATIVE NEGATIVE    Comment: (NOTE) SARS-CoV-2 target nucleic acids are NOT DETECTED.  The SARS-CoV-2 RNA is generally  detectable in upper respiratory specimens during the acute phase of infection. The lowest concentration of SARS-CoV-2 viral copies this assay can detect is 138 copies/mL. A negative result does not preclude SARS-Cov-2 infection and should not be used as the sole basis for treatment or other patient management decisions. A negative result may occur with  improper specimen collection/handling, submission of specimen other than nasopharyngeal swab, presence of viral mutation(s) within the areas targeted by this assay, and inadequate number of viral copies(<138 copies/mL). A negative result must be combined with clinical observations, patient history, and epidemiological information. The expected result is Negative.  Fact Sheet for Patients:  BloggerCourse.com  Fact Sheet for Healthcare Providers:  SeriousBroker.it  This test is no t yet approved or cleared by the [place] FDA and  has been authorized for detection and/or diagnosis of SARS-CoV-2 by FDA under an Emergency Use Authorization (EUA). This EUA will remain  in effect (meaning this test can be used) for the duration of the COVID-19 declaration under Section 564(b)(1) of the Act, 21 U.S.C.section 360bbb-3(b)(1), unless the authorization is terminated  or revoked sooner.       Influenza A by PCR NEGATIVE NEGATIVE   Influenza B by PCR NEGATIVE NEGATIVE    Comment: (NOTE) The Xpert Xpress SARS-CoV-2/FLU/RSV plus assay is intended as an aid in the diagnosis of influenza from Nasopharyngeal swab specimens and should not be used as a sole basis for treatment. Nasal washings and aspirates are unacceptable for Xpert Xpress SARS-CoV-2/FLU/RSV testing.  Fact Sheet for Patients: BloggerCourse.com  Fact Sheet for Healthcare Providers: SeriousBroker.it  This test is not yet approved or cleared by the [place] FDA and has  been authorized for detection and/or diagnosis of SARS-CoV-2 by FDA under an Emergency Use Authorization (EUA). This EUA will remain in effect (meaning this test can be used) for the duration of the COVID-19 declaration under Section 564(b)(1) of the Act, 21 U.S.C. section 360bbb-3(b)(1), unless the authorization is terminated or revoked.  Performed at [facility], [address], [place], [place] [address]   Urine rapid drug screen (hosp performed)     Status: None   Collection Time: 08/12/20 10:00 PM  Result Value Ref Range   Opiates NONE DETECTED NONE DETECTED   Cocaine NONE DETECTED NONE DETECTED   Benzodiazepines NONE DETECTED NONE DETECTED   Amphetamines NONE DETECTED NONE DETECTED   Tetrahydrocannabinol NONE DETECTED NONE DETECTED   Barbiturates NONE DETECTED NONE DETECTED  Comment: (NOTE) DRUG SCREEN FOR MEDICAL PURPOSES ONLY.  IF CONFIRMATION IS NEEDED FOR ANY PURPOSE, NOTIFY LAB WITHIN 5 DAYS.  LOWEST DETECTABLE LIMITS FOR URINE DRUG SCREEN Drug Class                     Cutoff (ng/mL) Amphetamine and metabolites    1000 Barbiturate and metabolites    200 Benzodiazepine                 200 Tricyclics and metabolites     300 Opiates and metabolites        300 Cocaine and metabolites        300 THC                            50 Performed at [facility], [address] [place], [place] [address]   Urinalysis, Routine w reflex microscopic Urine, Clean Catch     Status: None   Collection Time: 08/12/20 10:00 PM  Result Value Ref Range   Color, Urine YELLOW YELLOW   APPearance CLEAR CLEAR   Specific Gravity, Urine 1.020 1.005 - 1.030   pH 7.0 5.0 - 8.0   Glucose, UA NEGATIVE NEGATIVE mg/dL   Hgb urine dipstick NEGATIVE NEGATIVE   Bilirubin Urine NEGATIVE NEGATIVE   Ketones, ur NEGATIVE NEGATIVE mg/dL   Protein, ur NEGATIVE NEGATIVE mg/dL   Nitrite NEGATIVE NEGATIVE   Leukocytes,Ua NEGATIVE NEGATIVE    Comment: Performed at [facility], [address]  [place], [place] [address]  Pregnancy, urine     Status: None   Collection Time: 08/12/20 10:00 PM  Result Value Ref Range   Preg Test, Ur NEGATIVE NEGATIVE    Comment:        THE SENSITIVITY OF THIS METHODOLOGY IS >20 mIU/mL. Performed at [facility], [address], [place], [place] [address]     Chemistries  Recent Labs  Lab 08/12/20 2120  NA 136  K 3.5  CL 105  CO2 28  GLUCOSE 96  BUN 14  CREATININE 0.86  CALCIUM 8.7*  AST 23  ALT 24  ALKPHOS 48  BILITOT 0.8   ------------------------------------------------------------------------------------------------------------------  ------------------------------------------------------------------------------------------------------------------ GFR: Estimated Creatinine Clearance: 83.6 mL/min (by C-G formula based on SCr of 0.86 mg/dL). Liver Function Tests: Recent Labs  Lab 08/12/20 2120  AST 23  ALT 24  ALKPHOS 48  BILITOT 0.8  PROT 6.7  ALBUMIN 3.8   No results for input(s): LIPASE, AMYLASE in the last 168 hours. No results for input(s): AMMONIA in the last 168 hours. Coagulation Profile: Recent Labs  Lab 08/12/20 2120  INR 1.0   Cardiac Enzymes: No results for input(s): CKTOTAL, CKMB, CKMBINDEX, TROPONINI in the last 168 hours. BNP (last 3 results) No results for input(s): PROBNP in the last 8760 hours. HbA1C: No results for input(s): HGBA1C in the last 72 hours. CBG: Recent Labs  Lab 08/12/20 2147  GLUCAP 71   Lipid Profile: No results for input(s): CHOL, HDL, LDLCALC, TRIG, CHOLHDL, LDLDIRECT in the last 72 hours. Thyroid Function Tests: No results for input(s): TSH, T4TOTAL, FREET4, T3FREE, THYROIDAB in the last 72 hours. Anemia Panel: No results for input(s): VITAMINB12, FOLATE, FERRITIN, TIBC, IRON, RETICCTPCT in the last 72 hours.  --------------------------------------------------------------------------------------------------------------- Urine analysis:    Component Value Date/Time    COLORURINE YELLOW 08/12/2020 2200   APPEARANCEUR CLEAR 08/12/2020 2200   LABSPEC 1.020 08/12/2020 2200   PHURINE 7.0 08/12/2020 2200   GLUCOSEU  NEGATIVE 08/12/2020 2200   HGBUR NEGATIVE 08/12/2020 2200   BILIRUBINUR NEGATIVE 08/12/2020 2200   KETONESUR NEGATIVE 08/12/2020 2200   PROTEINUR NEGATIVE 08/12/2020 2200   UROBILINOGEN 0.2 06/23/2010 1457   NITRITE NEGATIVE 08/12/2020 2200   LEUKOCYTESUR NEGATIVE 08/12/2020 2200      Imaging Results:    CT HEAD CODE STROKE WO CONTRAST  Result Date: 08/12/2020 CLINICAL DATA:  Code stroke. Acute anxiety with feeling of tingling all over EXAM: CT HEAD WITHOUT CONTRAST TECHNIQUE: Contiguous axial images were obtained from the base of the skull through the vertex without intravenous contrast. COMPARISON:  None. FINDINGS: Brain: There is no mass, hemorrhage or extra-axial collection. The size and configuration of the ventricles and extra-axial CSF spaces are normal. The brain parenchyma is normal, without evidence of acute or chronic infarction. Vascular: No abnormal hyperdensity of the major intracranial arteries or dural venous sinuses. No intracranial atherosclerosis. Skull: The visualized skull base, calvarium and extracranial soft tissues are normal. Sinuses/Orbits: No fluid levels or advanced mucosal thickening of the visualized paranasal sinuses. No mastoid or middle ear effusion. The orbits are normal. ASPECTS [facility] Stroke Program Early CT Score) - Ganglionic level infarction (caudate, lentiform nuclei, internal capsule, insula, M1-M3 cortex): 7 - Supraganglionic infarction (M4-M6 cortex): 3 Total score (0-10 with 10 being normal): 10 IMPRESSION: 1. Normal head CT. 2. ASPECTS is 10. Electronically Signed   By: Deatra Robinson M.D.   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  Code Status:  full  Admission status: Observation  Time spent in minutes : 65   Tao Satz B Zierle-Ghosh DO

## 2020-08-13 NOTE — Procedures (Signed)
Patient Name: AMIJAH TIMOTHY  MRN: [id number]  Epilepsy Attending: Charlsie Quest  Referring Physician/Provider: Dr Kendell Bane,  Date: 08/13/2020 Duration: 23.15 mins  Patient history: 40 y.o. female,with anxiety, CVA, HLD, vit D deficiency presents to the ED with a chief complaint of whole body paresthesias. EEG to evaluate for seizure.   Level of alertness: Awake, asleep  AEDs during EEG study: Xanax  Technical aspects: This EEG study was done with scalp electrodes positioned according to the 10-20 International system of electrode placement. Electrical activity was acquired at a sampling rate of 500Hz  and reviewed with a high frequency filter of 70Hz  and a low frequency filter of 1Hz . EEG data were recorded continuously and digitally stored.   Description: The posterior dominant rhythm consists of 11 Hz activity of moderate voltage (25-35 uV) seen predominantly in posterior head regions, symmetric and reactive to eye opening and eye closing. Sleep was characterized by vertex waves, sleep spindles (12 to 14 Hz), maximal frontocentral region.  Hyperventilation and photic stimulation were not performed.     IMPRESSION: This study is within normal limits. No seizures or epileptiform discharges were seen throughout the recording.  Danzel Marszalek 

## 2020-08-13 NOTE — Progress Notes (Signed)
EEG completed, results pending. 

## 2020-08-13 NOTE — Progress Notes (Signed)
2145 call time 2151 exam started 2153 exam finished 2154 images sent to soc 2156 exam completed in epic 2155 [facility] radiology called.

## 2020-08-13 NOTE — Evaluation (Signed)
Physical Therapy Evaluation Patient Details Name: Marissa [name] MRN: [id number] DOB: November 29, 1980 Today's Date: 08/13/2020   History of Present Illness  Marissa [name]  is a 40 y.o. female,with anxiety, CVA, HLD, vit D deficiency presents to the ED with a chief complaint of whole body paresthesias. While patient was in the ED she developed difficulty speaking, so she was not able to provide me with history. She did point at her right chest when I asked if she was in any pain. She had a notepad at bedside and via small notes reports that she is not able to speak, but then she proceeds to whisper. She is able to speak clearly, and with normal language, but without voice, just whispering. She had right arm jerking that started at the same time as the whispering. Code stroke was called in the ED. She had a negative head CT. Neuro evaluated her and offered her TPA. Patient declined. Neuro Recommended admission for TIA/CVA workup. 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 Patient shakes her head no to radiating pain. She shakes her head no to dyspnea, cough, fever. Patient writes that she has a history of anxiety. No other complaints at this time.   Clinical Impression  Patient functioning near baseline for functional mobility and gait demonstrating good return for ambulation on level, inclined, declined surfaces and on stairs using 1 side rail without loss of balance.  Plan:  Patient discharged from physical therapy to care of nursing for ambulation daily as tolerated for length of stay.      Follow Up Recommendations No PT follow up    Equipment Recommendations  None recommended by PT    Recommendations for Other Services       Precautions / Restrictions Precautions Precautions: None Restrictions Weight Bearing Restrictions: No      Mobility  Bed Mobility Overal bed mobility: Independent                   Transfers Overall transfer level: Independent Equipment used: None                Ambulation/Gait Ambulation/Gait assistance: Modified independent (Device/Increase time) Gait Distance (Feet): 200 Feet Assistive device: None Gait Pattern/deviations: WFL(Within Functional Limits) Gait velocity: decreased   General Gait Details: demonstrates good return for ambulation on level, inclined and declined surfaces without loss of balance  Stairs Stairs: Yes Stairs assistance: Modified independent (Device/Increase time) Stair Management: One rail Right;One rail Left;Alternating pattern Number of Stairs: 10 General stair comments: demonstrates good return for going up/down stairs using 1 siderail without loss of balance  Wheelchair Mobility    Modified Rankin (Stroke Patients Only)       Balance Overall balance assessment: Independent                                           Pertinent Vitals/Pain Pain Assessment: No/denies pain    Home Living Family/patient expects to be discharged to:: Private residence Living Arrangements: Spouse/significant other;Children Available Help at Discharge: Family;Available 24 hours/day Type of Home: Apartment Home Access: Stairs to enter Entrance Stairs-Rails: Right Entrance Stairs-Number of Steps: 12 Home Layout: One level Home Equipment: Grab bars - tub/shower Additional Comments: Husband can be available 24/7. Mother PRN. Pt cares for 3 children.    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     Hand Dominance   Dominant Hand: Right    Extremity/Trunk Assessment   Upper Extremity Assessment Upper Extremity Assessment: Defer to OT evaluation    Lower Extremity Assessment Lower Extremity Assessment: Overall WFL for tasks assessed    Cervical / Trunk Assessment Cervical / Trunk Assessment: Normal  Communication    Communication: No difficulties  Cognition Arousal/Alertness: Awake/alert Behavior During Therapy: WFL for tasks assessed/performed Overall Cognitive Status: Within Functional Limits for tasks assessed                                        General Comments      Exercises     Assessment/Plan    PT Assessment Patent does not need any further PT services  PT Problem List         PT Treatment Interventions      PT Goals (Current goals can be found in the Care Plan section)  Acute Rehab PT Goals Patient Stated Goal: return home PT Goal Formulation: With patient Time For Goal Achievement: 08/13/20 Potential to Achieve Goals: Good    Frequency     Barriers to discharge        Co-evaluation PT/OT/SLP Co-Evaluation/Treatment: Yes Reason for Co-Treatment: To address functional/ADL transfers PT goals addressed during session: Mobility/safety with mobility;Balance OT goals addressed during session: ADL's and self-care;Strengthening/ROM       AM-PAC PT "6 Clicks" Mobility  Outcome Measure Help needed turning from your back to your side while in a flat bed without using bedrails?: None Help needed moving from lying on your back to sitting on the side of a flat bed without using bedrails?: None Help needed moving to and from a bed to a chair (including a wheelchair)?: None Help needed standing up from a chair using your arms (e.g., wheelchair or bedside chair)?: None Help needed to walk in hospital room?: None Help needed climbing 3-5 steps with a railing? : None 6 Click Score: 24    End of Session   Activity Tolerance: Patient tolerated treatment well Patient left: in chair;with call bell/phone within reach Nurse Communication: Mobility status PT Visit Diagnosis: Unsteadiness on feet (R26.81);Other abnormalities of gait and mobility (R26.89);Muscle weakness (generalized) (M62.81)    Time: 3557-3220 PT Time Calculation (min) (ACUTE ONLY): 14  min   Charges:   PT Evaluation $PT Eval Low Complexity: 1 Low PT Treatments $Gait Training: 8-22 mins        10:59 AM, 08/13/20 Ocie Bob, MPT Physical Therapist with [facility] [phone] office [phone] mobile phone

## 2020-08-14 DIAGNOSIS — R4701 Aphasia: Secondary | ICD-10-CM | POA: Diagnosis not present

## 2020-08-14 DIAGNOSIS — R569 Unspecified convulsions: Secondary | ICD-10-CM | POA: Diagnosis not present

## 2020-08-14 LAB — HEMOGLOBIN A1C
Hgb A1c MFr Bld: 5.8 % — ABNORMAL HIGH (ref 4.8–5.6)
Mean Plasma Glucose: 120 mg/dL

## 2020-08-14 MED ORDER — HYDROXYZINE HCL 50 MG PO TABS: 50.0000 mg | ORAL_TABLET | Freq: Three times a day (TID) | ORAL | 0 refills | Status: DC | PRN

## 2020-08-14 MED ORDER — ALPRAZOLAM 0.5 MG PO TABS: 0.5000 mg | ORAL_TABLET | Freq: Three times a day (TID) | ORAL | 0 refills | Status: DC | PRN

## 2020-08-14 MED ORDER — ATORVASTATIN CALCIUM 40 MG PO TABS: 40.0000 mg | ORAL_TABLET | Freq: Every day | ORAL | 11 refills | Status: DC

## 2020-08-14 NOTE — Progress Notes (Signed)
Pt has discharge orders. Discharge teaching given and new medications discussed with pt and mother bedside. No further questions at this time. Pt wanted to walk out to car with her mother instead of being wheeled down.

## 2020-08-14 NOTE — Discharge Summary (Addendum)
Physician Discharge [name] [facility]    Patient: Marissa [name]                   Admit date: 08/12/2020   DOB: Apr 15, 1980             Discharge date:08/14/2020/11:49 AM ZOX:[id number]                          PCP: Marissa [name]  Disposition: HOME   Recommendations for Outpatient Follow-up:   Follow up: PCP, with referral to psychiatrist ASAP Prescription for Atarax, as needed Xanax given--- patient mother was instructed to follow-up with PCP for further evaluation..  Effect of medications was discussed in detail. Current finding including ruling out stroke, seizures were discussed with the patient and mother in detail. Recommended to follow-up with neurologist for evaluation for MRI, questionable CVA acute versus chronic  Discharge Condition: Stable   Code Status:   Code Status: Full Code  Diet recommendation: Regular healthy diet   Discharge Diagnoses:    Active Problems:   TIA (transient ischemic attack)   History of Present Illness/ Hospital Course Marissa [name]:       Marissa BatheDanielle [name]  is a 40 y.o. female,with anxiety, CVA, HLD, vit D deficiency presents to the ED with a chief complaint of whole body paresthesias. While patient was in the ED she developed difficulty speaking, so she was not able to provide me with history. She did point at her right chest when I asked if she was in any pain. She had a notepad at bedside and via small notes reports that she is not able to speak, but then she proceeds to whisper. She is able to speak clearly, and with normal language, but without voice, just whispering. She had right arm jerking that started at the same time as the whispering. Code stroke was called in the ED. She had a negative head CT. Neuro evaluated her and offered her TPA. Patient declined. Neuro Recommended admission for TIA/CVA workup. 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 Patient shakes her head no to radiating pain. She shakes her head no to dyspnea, cough, fever. Patient writes that she has a history of anxiety. No other complaints at this time.   In the ED T 99.4, HR 70 -95, R 13 - 20, BP 108/72 99% WBC 4.9, hgb 11.9 Chemistry panel is unremarkable Neg preg Covid negative UA negative UDS negative EtOH negative Code stroke called - as above EKG = HR 80, SR, Qtc 431 Tpa declined Neuro advised starting aspirin      Assessment & Plan:     Active Problems:   TIA (transient ischemic attack)     Possible TIA versus acute CVA Questionable small CVA on MRI: Suggesting 3 mm focus of restricted diffusion within the medial right -discussed with the neurologist, not really convinced that this is a CVA versus artifact her symptoms is not consistent with the findings. Continuing aspirin and Lipitor temporal lobe/hippocampus, compatible with acute infarction Ruled out CVA, TIA work-up were negative No focal neurological findings, patient is cooperative Declined TPA CT head negative MRI/MRA negative with exception of 1 to 2 mm protrusion possible aneurysm -if there was an old infarct did not show on MRI , 3 mm focus of restricted diffusion within the medial right temporal lobe/hippocampus, compatible with acute infarction Echo >>>  reviewed EEG negative for any signs of seizures or seizure activities US carotids >> study no signs of occlusion Continue aspirin, statins SP/OT/PT eval and treat >> status post evaluation no recommendation for continuous treatment, patient remains fully functional On cardiac monitor since admission no events were recorded Symptoms has resolved 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   Code Status:  full   Admission status: Observation    Discharge Instructions:   Discharge Instructions     Activity as tolerated - No restrictions   Complete by: As directed    Call MD for:  difficulty breathing, headache or visual disturbances   Complete by: As directed    Call MD for:  persistant nausea and vomiting   Complete by: As directed    Call MD for:  redness, tenderness, or signs of infection (pain, swelling, redness, odor or green/yellow discharge around incision site)   Complete by: As directed    Diet - low sodium heart healthy   Complete by: As directed    Discharge instructions   Complete by: As directed    Please follow-up with your PCP and referral to psychiatrist, for underlying anxiety attack, panic attacks, You are prescribed as needed Xanax and Atarax till  physicians for further detail evaluation   Increase activity slowly   Complete by: As directed         Medication List     STOP taking these medications    albuterol 108 (90 Base) MCG/ACT inhaler Commonly known as: VENTOLIN HFA   benzonatate 100 MG capsule Commonly known as: TESSALON   ibuprofen 600 MG tablet Commonly known as: ADVIL   promethazine-dextromethorphan 6.25-15 MG/5ML syrup Commonly known as: PROMETHAZINE-DM       TAKE these medications    acetaminophen 500 MG tablet Commonly known as: TYLENOL Take 1,000 mg by mouth every 6 (six) hours as needed.   ALPRAZolam 0.5 MG tablet Commonly known as: XANAX Take 1 tablet (0.5 mg total) by  mouth 3 (three) times daily as needed for anxiety.   aspirin 81 MG chewable tablet Chew 81 mg by mouth once as needed.   cetirizine 10 MG tablet Commonly known as: ZYRTEC Take 10 mg by mouth daily.   hydrOXYzine 50 MG tablet Commonly known as: ATARAX/VISTARIL Take 1 tablet (50 mg total) by mouth 3 (three) times daily as needed for anxiety.   Vitamin D (Ergocalciferol) 1.25 MG (50000 UNIT) Caps capsule Commonly known as: DRISDOL Take 1 capsule (50,000 Units total) by mouth every 7 (seven) days.        Allergies  Allergen Reactions   Triptans     Cannot use triptans for migraine per neurology due to stroke history   Morphine And Related Hives    Feels like skin is burning  Procedures /Studies:   MR ANGIO HEAD WO CONTRAST  Result Date: 08/13/2020 CLINICAL DATA:  Neuro deficit, acute, stroke suspected. Additional history provided: Patient reports bilateral weakness for 2 days. EXAM: MRI HEAD WITHOUT CONTRAST MRA HEAD WITHOUT CONTRAST TECHNIQUE: Multiplanar, multi-echo pulse sequences of the brain and surrounding structures were acquired without intravenous contrast. Angiographic images of the Circle of Willis were acquired using MRA technique without intravenous contrast. COMPARISON:  Prior non-contrast head CT examinations 08/12/2020 and earlier. FINDINGS: MRI HEAD FINDINGS The patient was unable to tolerate the full examination. As a result, only axial and coronal diffusion-weighted imaging, and a sagittal T1 weighted sequence, could be obtained. There is a 3 mm focus of restricted diffusion within the medial right temporal lobe/hippocampus, compatible with acute infarct. No acute infarct is identified elsewhere within the brain. MRA HEAD FINDINGS Anterior circulation: The intracranial internal carotid arteries are patent. The M1 middle cerebral arteries are patent. No M2 proximal branch occlusion or high-grade proximal stenosis is identified. The anterior cerebral arteries are  patent. 1-2 mm inferomedially projecting vascular protrusion arising from the distal cavernous left ICA, which may reflect an aneurysm or infundibulum (for instance as seen on series 9, image 86). Posterior circulation: The intracranial vertebral arteries are patent. The basilar artery is patent. The posterior cerebral arteries are patent. Posterior communicating arteries are hypoplastic or absent bilaterally. Anatomic variants: As described. IMPRESSION: MRI brain: 1. The patient was unable to tolerate the full examination. As a result, only axial and coronal diffusion-weighted imaging, and a sagittal T1 weighted sequence, could be obtained. 2. 3 mm focus of restricted diffusion within the medial right temporal lobe/hippocampus, compatible with acute infarction. MRA head: 1. No intracranial large vessel occlusion or proximal high-grade arterial stenosis. 2. 1-2 mm inferomedially projecting vascular protrusion arising from the distal cavernous left ICA, which may reflect an aneurysm or infundibulum. Electronically Signed   By: Jackey Loge DO   On: 08/13/2020 09:35   MR BRAIN WO CONTRAST  Result Date: 08/13/2020 CLINICAL DATA:  Neuro deficit, acute, stroke suspected. Additional history provided: Patient reports bilateral weakness for 2 days. EXAM: MRI HEAD WITHOUT CONTRAST MRA HEAD WITHOUT CONTRAST TECHNIQUE: Multiplanar, multi-echo pulse sequences of the brain and surrounding structures were acquired without intravenous contrast. Angiographic images of the Circle of Willis were acquired using MRA technique without intravenous contrast. COMPARISON:  Prior non-contrast head CT examinations 08/12/2020 and earlier. FINDINGS: MRI HEAD FINDINGS The patient was unable to tolerate the full examination. As a result, only axial and coronal diffusion-weighted imaging, and a sagittal T1 weighted sequence, could be obtained. There is a 3 mm focus of restricted diffusion within the medial right temporal lobe/hippocampus,  compatible with acute infarct. No acute infarct is identified elsewhere within the brain. MRA HEAD FINDINGS Anterior circulation: The intracranial internal carotid arteries are patent. The M1 middle cerebral arteries are patent. No M2 proximal branch occlusion or high-grade proximal stenosis is identified. The anterior cerebral arteries are patent. 1-2 mm inferomedially projecting vascular protrusion arising from the distal cavernous left ICA, which may reflect an aneurysm or infundibulum (for instance as seen on series 9, image 86). Posterior circulation: The intracranial vertebral arteries are patent. The basilar artery is patent. The posterior cerebral arteries are patent. Posterior communicating arteries are hypoplastic or absent bilaterally. Anatomic variants: As described. IMPRESSION: MRI brain: 1. The patient was unable to tolerate the full examination. As a result, only axial and coronal diffusion-weighted imaging, and a sagittal T1 weighted sequence, could be obtained. 2. 3  mm focus of restricted diffusion within the medial right temporal lobe/hippocampus, compatible with acute infarction. MRA head: 1. No intracranial large vessel occlusion or proximal high-grade arterial stenosis. 2. 1-2 mm inferomedially projecting vascular protrusion arising from the distal cavernous left ICA, which may reflect an aneurysm or infundibulum. Electronically Signed   By: Jackey Loge DO   On: 08/13/2020 09:35   US Carotid Bilateral (at [facility] and [facility] only)  Result Date: 08/13/2020 CLINICAL DATA:  Right arm tingling EXAM: BILATERAL CAROTID DUPLEX ULTRASOUND TECHNIQUE: Wallace Cullens scale imaging, color Doppler and duplex ultrasound were performed of bilateral carotid and vertebral arteries in the neck. COMPARISON:  None. FINDINGS: Criteria: Quantification of carotid stenosis is based on velocity parameters that correlate the residual internal carotid diameter with NASCET-based stenosis levels, using the diameter of the distal  internal carotid lumen as the denominator for stenosis measurement. The following velocity measurements were obtained: RIGHT ICA: 103/38 cm/sec CCA: 84/23 cm/sec SYSTOLIC ICA/CCA RATIO:  1.2 ECA:  55 cm/sec LEFT ICA: 92/42 cm/sec CCA: 93/27 cm/sec SYSTOLIC ICA/CCA RATIO:  1.0 ECA:  51 cm/sec RIGHT CAROTID ARTERY: No significant atherosclerotic plaque or evidence of stenosis in the internal carotid artery. RIGHT VERTEBRAL ARTERY:  Patent with normal antegrade flow. LEFT CAROTID ARTERY: No significant atherosclerotic plaque or evidence of stenosis in the internal carotid artery. LEFT VERTEBRAL ARTERY:  Patent with normal antegrade flow. IMPRESSION: Normal bilateral carotid duplex ultrasound. Signed, Sterling Big, MD, RPVI [facility] Radiology Electronically Signed   By: Malachy Moan M.D.   On: 08/13/2020 09:10   EEG adult  Result Date: 08/13/2020 Charlsie Quest, MD     08/13/2020  5:56 PM Patient Name: MARYBELL ROBARDS MRN: [id number] Epilepsy Attending: Charlsie Quest Referring Physician/Provider: Dr Kendell Bane, Date: 08/13/2020 Duration: 23.15 mins Patient history: 40 y.o. female,with anxiety, CVA, HLD, vit D deficiency presents to the ED with a chief complaint of whole body paresthesias. EEG to evaluate for seizure. Level of alertness: Awake, asleep AEDs during EEG study: Xanax Technical aspects: This EEG study was done with scalp electrodes positioned according to the 10-20 International system of electrode placement. Electrical activity was acquired at a sampling rate of 500Hz  and reviewed with a high frequency filter of 70Hz  and a low frequency filter of 1Hz . EEG data were recorded continuously and digitally stored. Description: The posterior dominant rhythm consists of 11 Hz activity of moderate voltage (25-35 uV) seen predominantly in posterior head regions, symmetric and reactive to eye opening and eye closing. Sleep was  characterized by vertex waves, sleep spindles (12 to 14 Hz), maximal frontocentral region.  Hyperventilation and photic stimulation were not performed.   IMPRESSION: This study is within normal limits. No seizures or epileptiform discharges were seen throughout the recording.   ECHOCARDIOGRAM COMPLETE  Result Date: 08/13/2020   Final    CT HEAD CODE STROKE WO CONTRAST  Result Date: 08/12/2020 CLINICAL DATA:  Code stroke. Acute anxiety with feeling of tingling all over EXAM: CT HEAD WITHOUT CONTRAST TECHNIQUE: Contiguous axial images were obtained from the base of the skull through the vertex without intravenous contrast. COMPARISON:  None. FINDINGS: Brain: There is no mass, hemorrhage or extra-axial collection. The size and configuration of the ventricles and extra-axial CSF spaces are normal. The brain parenchyma is normal, without evidence of acute or chronic infarction. Vascular: No abnormal hyperdensity of the major intracranial arteries or dural venous sinuses. No intracranial atherosclerosis. Skull: The visualized skull base,  calvarium and extracranial soft tissues are normal. Sinuses/Orbits: No fluid levels or advanced mucosal thickening of the visualized paranasal sinuses. No mastoid or middle ear effusion. The orbits are normal. ASPECTS [facility] Stroke Program Early CT Score) - Ganglionic level infarction (caudate, lentiform nuclei, internal capsule, insula, M1-M3 cortex): 7 - Supraganglionic infarction (M4-M6 cortex): 3 Total score (0-10 with 10 being normal): 10 IMPRESSION: 1. Normal head CT. 2. ASPECTS is 10. Electronically Signed   By: Deatra Robinson M.D.   On: 08/12/2020 21:59    Subjective:   Patient was seen and examined 08/14/2020, 11:49 AM Patient stable today. No acute distress.  No issues overnight Stable for discharge.  Discharge Exam:    Vitals:   08/13/20 1829 08/13/20 2058 08/14/20 0111 08/14/20 0504  BP: 105/79 104/70 100/60 106/65  Pulse: 74 78 67 65   Resp: 18 18 18 18   Temp: 98.7 F (37.1 C) 98.2 F (36.8 C) 98.1 F (36.7 C) 98.3 F (36.8 C)  TempSrc: Oral Oral Oral Oral  SpO2: 100% 97% 100% 99%  Weight:      Height:        General: Pt lying comfortably in bed & appears in no obvious distress. Cardiovascular: S1 & S2 heard, RRR, S1/S2 +. No murmurs, rubs, gallops or clicks. No JVD or pedal edema. Respiratory: Clear to auscultation without wheezing, rhonchi or crackles. No increased work of breathing. Abdominal:  Non-distended, non-tender & soft. No organomegaly or masses appreciated. Normal bowel sounds heard. CNS: Alert and oriented. No focal deficits. Extremities: no edema, no cyanosis      The results of significant diagnostics from this hospitalization (including imaging, microbiology, ancillary and laboratory) are listed below for reference.      Microbiology:   Recent Results (from the past 240 hour(s))  Resp Panel by RT-PCR (Flu A&B, Covid) Nasopharyngeal Swab     Status: None   Collection Time: 08/12/20 10:00 PM   Specimen: Nasopharyngeal Swab; Nasopharyngeal([name]) swabs in vial transport medium  Result Value Ref Range Status   SARS Coronavirus 2 by RT PCR NEGATIVE NEGATIVE Final    Comment: (NOTE) SARS-CoV-2 target nucleic acids are NOT DETECTED.  The SARS-CoV-2 RNA is generally detectable in upper respiratory specimens during the acute phase of infection. The lowest concentration of SARS-CoV-2 viral copies this assay can detect is 138 copies/mL. A negative result does not preclude SARS-Cov-2 infection and should not be used as the sole basis for treatment or other patient management decisions. A negative result may occur with  improper specimen collection/handling, submission of specimen other than nasopharyngeal swab, presence of viral mutation(s) within the areas targeted by this assay, and inadequate number of viral copies(<138 copies/mL). A negative result must be combined with clinical observations,  patient history, and epidemiological information. The expected result is Negative.  Fact Sheet for Patients:  08/14/20  Fact Sheet for Healthcare Providers:  BloggerCourse.com  This test is no t yet approved or cleared by the [place] FDA and  has been authorized for detection and/or diagnosis of SARS-CoV-2 by FDA under an Emergency Use Authorization (EUA). This EUA will remain  in effect (meaning this test can be used) for the duration of the COVID-19 declaration under Section 564(b)(1) of the Act, 21 U.S.C.section 360bbb-3(b)(1), unless the authorization is terminated  or revoked sooner.       Influenza A by PCR NEGATIVE NEGATIVE Final   Influenza B by PCR NEGATIVE NEGATIVE Final    Comment: (NOTE) The Xpert Xpress SARS-CoV-2/FLU/RSV plus assay is  intended as an aid in the diagnosis of influenza from Nasopharyngeal swab specimens and should not be used as a sole basis for treatment. Nasal washings and aspirates are unacceptable for Xpert Xpress SARS-CoV-2/FLU/RSV testing.  Fact Sheet for Patients: BloggerCourse.com  Fact Sheet for Healthcare Providers: SeriousBroker.it  This test is not yet approved or cleared by the [place] FDA and has been authorized for detection and/or diagnosis of SARS-CoV-2 by FDA under an Emergency Use Authorization (EUA). This EUA will remain in effect (meaning this test can be used) for the duration of the COVID-19 declaration under Section 564(b)(1) of the Act, 21 U.S.C. section 360bbb-3(b)(1), unless the authorization is terminated or revoked.  Performed at [facility], [address], [place], [place] [address]      Labs:   CBC: Recent Labs  Lab 08/12/20 2120  WBC 4.9  NEUTROABS 2.4  HGB 11.9*  HCT 35.5*  MCV 96.7  PLT 206   Basic Metabolic Panel: Recent Labs  Lab 08/12/20 2120  NA 136  K 3.5  CL 105  CO2  28  GLUCOSE 96  BUN 14  CREATININE 0.86  CALCIUM 8.7*   Liver Function Tests: Recent Labs  Lab 08/12/20 2120  AST 23  ALT 24  ALKPHOS 48  BILITOT 0.8  PROT 6.7  ALBUMIN 3.8   BNP (last 3 results) No results for input(s): BNP in the last 8760 hours. Cardiac Enzymes: No results for input(s): CKTOTAL, CKMB, CKMBINDEX, TROPONINI in the last 168 hours. CBG: Recent Labs  Lab 08/12/20 2147  GLUCAP 71   Hgb A1c Recent Labs    08/13/20 0414  HGBA1C 5.8*   Lipid Profile Recent Labs    08/13/20 0414  CHOL 162  HDL 68  LDLCALC 88  TRIG 32  CHOLHDL 2.4   Thyroid function studies No results for input(s): TSH, T4TOTAL, T3FREE, THYROIDAB in the last 72 hours.  Invalid input(s): FREET3 Anemia work up No results for input(s): VITAMINB12, FOLATE, FERRITIN, TIBC, IRON, RETICCTPCT in the last 72 hours. Urinalysis    Component Value Date/Time   COLORURINE YELLOW 08/12/2020 2200   APPEARANCEUR CLEAR 08/12/2020 2200   LABSPEC 1.020 08/12/2020 2200   PHURINE 7.0 08/12/2020 2200   GLUCOSEU NEGATIVE 08/12/2020 2200   HGBUR NEGATIVE 08/12/2020 2200   BILIRUBINUR NEGATIVE 08/12/2020 2200   KETONESUR NEGATIVE 08/12/2020 2200   PROTEINUR NEGATIVE 08/12/2020 2200   UROBILINOGEN 0.2 06/23/2010 1457   NITRITE NEGATIVE 08/12/2020 2200   LEUKOCYTESUR NEGATIVE 08/12/2020 2200         Time coordinating discharge: Over 45 minutes  SIGNED: Kendell Bane, MD, FACP, [facility]. [facility],  Please use [email or web address] to Page If 7PM-7AM, please contact night-coverage [email or web address] 08/14/2020, 11:49 AM

## 2020-08-14 NOTE — BH Assessment (Signed)
Called mother, Kipp Laurence at [phone] to get collateral information per NP request. No answer. Left HIPAA complaint message for a callback. (Mother is listed in Epic as pt's Regulatory affairs officer" with permission given to call.) Pt also gave verbal permission to call mother for collateral information during assessment interview and supplied the phone number.   Kassidee Narciso T. Jimmye Norman, MS, [facility], [facility] Triage Specialist [facility]

## 2020-08-14 NOTE — BH Assessment (Addendum)
Comprehensive Clinical Assessment (CCA) Screening, Triage and Referral Note  08/14/2020 KANDISS IHRIG [id number]  DISPOSITION: DISPOSITION PENDING- TTS assessment interview with pt complete. NP requesting collateral information from family. Call placed to mother, Kipp [facility] Party Release" in [place]) and pt gave permission during assessment interview for collateral contact. [phone]. No answer and HIPAA complaint message left requesting a callback.   The patient demonstrates the following risk factors for suicide: Chronic risk factors for suicide include: medical illness CVA sx . Acute risk factors for suicide include: N/A. Protective factors for this patient include: positive social support and hope for the future. Considering these factors, the overall suicide risk at this point appears to be low. Patient is appropriate for outpatient follow up.  Flowsheet Row ED to Hosp-Admission (Current) from 08/12/2020 in [facility] SURGICAL UNIT ED from 06/28/2020 in [facility] at [facility] RISK CATEGORY No Risk No Risk       Pt presented to the ED today voluntarily and unaccompanied c/o medical symptoms that appeared related to a stroke per chart. After medical investigation, TTS ordered to r/o conversion disorder per order. Pt denied SI, HI, NSSH, AVH, substance use and depression sx. Pt admitted to anxiety sx but denied any panic attacks. Pt stated she has a hx of CVA from childhood which (per chart) was verified through her mother.  Patient was seen in her hospital bed and appeared somewhat drowsy and tired. Patient was of average stature, weight and build with normal grooming and casual dress. Posture/gait, movement, concentration, and memory within normal limits. Normal attention and concentration and oriented to person, time, place and situation. Mood was pleasant and agreeable and affect was congruent with mood. Normal eye contact and responsive facial  expressions. Patient was cooperative and a bit guarded although forthcoming with information when asked. Patient answered questions with yes/no answers, one word or short phrases only. Speech, thought content and organization was within normal limits. Appeared to have average intelligence with poor judgment and insight but within normal limits for age.    Chief Complaint:  Chief Complaint  Patient presents with   Anxiety   Visit Diagnosis:  GAD  Patient Reported Information How did you hear about [place]? Self  What Is the Reason for Your Visit/Call Today? Pt presented to the ED today voluntarily and unaccompanied c/o medical symptoms that appeared related to a stroke per chart. After medical investigation, TTS ordered to r/o conversion disorder per order. Pt denied SI, HI, NSSH, AVH, substance use and depression sx. Pt admitted to anxiety sx but denied any panic attacks. Pt stated she has a hx of CVA from childhood which (per chart) was verified through her mother.  How Long Has This Been Causing You Problems? <Week  What Do You Feel Would Help You the Most Today? No data recorded  Have You Recently Had Any Thoughts About Hurting Yourself? No  Are You Planning to Commit Suicide/Harm Yourself At This time? No   Have you Recently Had Thoughts About Hurting Someone Karolee Ohs? No  Are You Planning to Harm Someone at This Time? No  Explanation: No data recorded  Have You Used Any Alcohol or Drugs in the Past 24 Hours? No  How Long Ago Did You Use Drugs or Alcohol? No data recorded What Did You Use and How Much? No data recorded  Do You Currently Have a Therapist/Psychiatrist? No  Name of Therapist/Psychiatrist: No data recorded  Have You Been Recently Discharged From Any Office  Practice or Programs? No  Explanation of Discharge From Practice/Program: No data recorded   CCA Screening Triage Referral Assessment Type of Contact: Tele-Assessment  Telemedicine Service Delivery:   Is this  Initial or Reassessment? Initial Assessment  Date Telepsych consult ordered in [facility]:  08/13/20  Time Telepsych consult ordered in [facility]:  1035  Location of Assessment: [facility] ED  Provider Location: [facility] Assessment Services   Collateral Involvement: none   Does Patient Have a Automotive engineer Guardian? No data recorded Name and Contact of Legal Guardian: No data recorded If Minor and Not Living with Parent(s), Who has Custody? No data recorded Is CPS involved or ever been involved? -- (UTA)  Is APS involved or ever been involved? -- (UTA)   Patient Determined To Be At Risk for Harm To Self or Others Based on Review of Patient Reported Information or Presenting Complaint? No  Method: No data recorded Availability of Means: No data recorded Intent: No data recorded Notification Required: No data recorded Additional Information for Danger to Others Potential: No data recorded Additional Comments for Danger to Others Potential: No data recorded Are There Guns or Other Weapons in Your Home? No data recorded Types of Guns/Weapons: No data recorded Are These Weapons Safely Secured?                            No data recorded Who Could Verify You Are Able To Have These Secured: No data recorded Do You Have any Outstanding Charges, Pending Court Dates, Parole/Probation? No data recorded Contacted To Inform of Risk of Harm To Self or Others: No data recorded  Does Patient Present under Involuntary Commitment? No  IVC Papers Initial File Date: No data recorded  [place] of Residence: [place]   Patient Currently Receiving the Following Services: No data recorded  Determination of Need: Routine (7 days)   Options For Referral: No data recorded  Discharge Disposition:     Carolanne Grumbling, Counselor

## 2020-08-14 NOTE — Progress Notes (Addendum)
Ask to review imaging. Patient presents with difficulty talking, numbness all over and shaking especially on the right side. MRI however shows small restriction involving the right medial temple region with corresponding reduced signal on the ADC scan. MRA is unrevealing unrevealing. Lesion appears real and likely represent effect of a focal seizures versus small ischemic event. Aspirin is recommended and close follow-up about 4 weeks.  EEG done is normal. Sz medicines not recommended at this time.

## 2020-08-14 NOTE — Evaluation (Signed)
Speech Language Pathology Evaluation Patient Details Name: Marissa [name] MRN: [id number] DOB: 07/07/1980 Today's Date: 08/14/2020 Time: 0277-4128 SLP Time Calculation (min) (ACUTE ONLY): 37 min  Problem List:  Patient Active Problem List   Diagnosis Date Noted   TIA (transient ischemic attack) 08/13/2020   Cough 02/02/2020   COVID-19 virus detected 02/02/2020   Bilateral hand numbness 12/13/2019   Swelling of both hands 12/13/2019   Annual visit for general adult medical examination with abnormal findings 08/30/2019   History of CVA (cerebrovascular accident) 12/09/2018   Anxiety 12/09/2018   Obesity (BMI 30.0-34.9) 12/09/2018   Common migraine with intractable migraine 11/08/2015   PFO (patent foramen ovale) 10/08/2015   HLD (hyperlipidemia) 10/08/2015   Vitamin D deficiency 10/08/2015   Past Medical History:  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 female,with anxiety, CVA, HLD, vit D deficiency presents to the ED with a chief complaint of whole body paresthesias. While patient was in the ED she developed difficulty speaking, so she was not able to provide me with history. She did point at her right chest when I asked if she was in any pain. She had a notepad at bedside and via small notes reports that she is not able to speak, but then she proceeds to whisper. She is able to speak clearly, and with normal language, but without voice, just whispering.  She had right arm jerking that started at the same time as the whispering. Code stroke was called in the ED. She had a negative head CT. Neuro evaluated her and offered her TPA. Patient declined. Neuro Recommended admission for TIA/CVA workup. Patient shakes her head no to radiating pain. She shakes her head no to dyspnea, cough, fever. Patient writes that she has a history of anxiety. No other complaints at this time. PT had small frontal CVA in 2017 and PFO repair 09/25/2015 with Dr. Mayford Knife. MRI shows 3 mm focus of restricted diffusion within the medial right temporal lobe/hippocampus, compatible with acute infarction. SLE ordered.   Assessment / Plan / Recommendation Clinical Impression  Cognitive linguistic evaluation completed via Pt interview and administration of SLUMS ([employer] Mental Status Examination). Pt scored a 24/30 with errors in 5 item recall (Pt recalled 3/5), clock drawing, and paragraph recall (6/8). Pt states that she probably would not have been able to remember the 5 items at baseline, but does report changes in the way she completed the clock. She was able to write the time of 10:50, but had difficulty placing the hands. Pt verbalized that he thinking and speech have returned to baseline, but she is concerned about right arm/hand tremor, staring off/vision changes at times, and the symptoms that originally brought her into the hospital. She will be following up with neurology after discharge. No further SLP services recommended at this time, however if Pt notes changes once she returns  home, she was advised to let her neurologist and/or PCP know for follow up.     SLP Assessment  SLP Recommendation/Assessment: Patient does not need any further Speech Lanaguage Pathology Services SLP Visit Diagnosis: Cognitive communication deficit (R41.841)    Follow Up Recommendations  None    Frequency and Duration           SLP Evaluation Cognition  Overall Cognitive Status:  Impaired/Different from baseline Arousal/Alertness: Awake/alert Orientation Level: Oriented X4 Memory: Impaired Awareness: Appears intact Problem Solving: Appears intact Executive Function:  (planning, clock drawing) Safety/Judgment: Appears intact       Comprehension  Auditory Comprehension Overall Auditory Comprehension: Appears within functional limits for tasks assessed Yes/No Questions: Within Functional Limits Commands: Impaired Multistep Basic Commands: 75-100% accurate (requested repetition) Conversation: Complex Interfering Components: Processing speed EffectiveTechniques: Repetition Visual Recognition/Discrimination Discrimination: Within Function Limits Reading Comprehension Reading Status: Not tested    Expression Expression Primary Mode of Expression: Verbal Verbal Expression Overall Verbal Expression: Appears within functional limits for tasks assessed Initiation: No impairment Automatic Speech: Name;Social Response Level of Generative/Spontaneous Verbalization: Conversation Repetition: No impairment Naming: No impairment Pragmatics: No impairment Non-Verbal Means of Communication: Not applicable Written Expression Dominant Hand: Right Written Expression: Not tested   Oral / Motor  Oral Motor/Sensory Function Overall Oral Motor/Sensory Function: Within functional limits Motor Speech Overall Motor Speech: Appears within functional limits for tasks assessed Respiration: Within functional limits Phonation: Normal Resonance: Within functional limits Articulation: Within functional limitis Intelligibility: Intelligible Motor Planning: Witnin functional limits Motor Speech Errors: Not applicable   Thank you,  Havery Moros, CCC-SLP [phone]                     Kahari Critzer 08/14/2020, 2:07 PM     Shayne Diguglielmo 08/14/2020,2:07 PM

## 2020-08-15 ENCOUNTER — Telehealth: Payer: Self-pay

## 2020-08-15 NOTE — Telephone Encounter (Signed)
Transition Care Management Unsuccessful Follow-up Telephone Call  Date of discharge and from where:  08/14/2020-[place]   Attempts:  1st Attempt  Reason for unsuccessful TCM follow-up call:  Left voice message

## 2020-08-16 ENCOUNTER — Ambulatory Visit: Payer: Medicaid Other | Admitting: Nurse Practitioner

## 2020-08-16 ENCOUNTER — Encounter: Payer: Self-pay | Admitting: Nurse Practitioner

## 2020-08-16 ENCOUNTER — Other Ambulatory Visit: Payer: Self-pay

## 2020-08-16 VITALS — BP 111/70 | HR 75 | Temp 97.7°F | Ht 62.0 in | Wt 174.0 lb

## 2020-08-16 DIAGNOSIS — F449 Dissociative and conversion disorder, unspecified: Secondary | ICD-10-CM | POA: Diagnosis not present

## 2020-08-16 DIAGNOSIS — G459 Transient cerebral ischemic attack, unspecified: Secondary | ICD-10-CM | POA: Diagnosis not present

## 2020-08-16 DIAGNOSIS — E785 Hyperlipidemia, unspecified: Secondary | ICD-10-CM | POA: Diagnosis not present

## 2020-08-16 NOTE — Progress Notes (Signed)
Established Patient Office Visit  Subjective:  Patient ID: Marissa [name], female    DOB: [birth year]  Age: 40 y.o. MRN: [id number]  CC:  Chief Complaint  Patient presents with   Transitions Of Care    Feels a little better today, just not completely. Feeling weak and fatigued.    HPI Marissa [name] presents for hospital follow-up. She was admitted from 08/12/20 to 08/14/20.  At that time, she was treated for " Possible TIA versus acute CVA Questionable small CVA on MRI: Suggesting 3 mm focus of restricted diffusion within the medial right -discussed with the neurologist, not really convinced that this is a CVA versus artifact her symptoms is not consistent with the findings. Continuing aspirin and Lipitor temporal lobe/hippocampus, compatible with acute infarction Ruled out CVA, TIA work-up were negative No focal neurological findings, patient is cooperative Declined TPA CT head negative MRI/MRA negative with exception of 1 to 2 mm protrusion possible aneurysm -if there was an old infarct did not show on MRI , 3 mm focus of restricted diffusion within the medial right temporal lobe/hippocampus, compatible with acute infarction Echo >>> reviewed EEG negative for any signs of seizures or seizure activities US carotids >> study no signs of occlusion Continue aspirin, statins SP/OT/PT eval and treat >> status post evaluation no recommendation for continuous treatment, patient remains fully functional On cardiac monitor since admission no events were recorded Symptoms has resolved Most likely symptoms are secondary functional neurologic system disorder/conversion disorder    Possible Conversion disorder,            TTS has been consulted appreciate input            Patient was given prescription for Atarax, as needed Xanax            Symptoms is complete resolved, responding well to above medication Anxiety Start vistaril PRN As needed Xanax ordered,  Hyperlipidemia  Lipid  panel, total cholesterol 162, LDL 88 Continue statin"  Yesterday, she felt disoriented and tired. "Not like my normal self". She has not taken the medicaitons from the hospital that were prescribed because she did not want drowsiness that was associated with the side-effect profile of the medicine.  She states that she has some short-term memory loss and difficulty organizing her thoughts.    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) 10/08/2015   R  frontal   Common migraine with intractable migraine 11/08/2015   Encounter for gynecological examination with Papanicolaou smear of cervix 12/30/2018   Hyperlipidemia    Stroke (HCC)    Stroke (HCC)    Vitamin D deficiency     Past Surgical History:  Procedure Laterality Date   CESAREAN SECTION     PATENT FORAMEN OVALE CLOSURE  09/25/2015   TUBAL LIGATION      Family History  Problem Relation Age of Onset   Hypertension Mother    Heart disease Maternal Uncle        murmur   Heart disease Maternal Grandmother    COPD Maternal Grandfather    Cancer Maternal Grandfather        lung   Heart disease Maternal Aunt        murmur   Migraines Neg Hx     Social History   Socioeconomic History   Marital status: Married    Spouse name: Christiane Ha   Number of  children: 3   Years of education: 12   Highest education level: Not on file  Occupational History   Occupation: Bakery assoc    Comment: [employer]  Tobacco Use   Smoking status: Never   Smokeless tobacco: Never  Substance and Sexual Activity   Alcohol use: No   Drug use: No   Sexual activity: Yes    Partners: Male    Birth control/protection: Surgical    Comment: tubal  Other Topics Concern   Not on file  Social History Narrative   Lives with husband and 3 children   Right-handed   Caffeine: rare   Social Determinants of Health   Financial Resource Strain: Not on file  Food  Insecurity: Not on file  Transportation Needs: Not on file  Physical Activity: Not on file  Stress: Not on file  Social Connections: Not on file  Intimate Partner Violence: Not on file    Outpatient Medications Prior to Visit  Medication Sig Dispense Refill   acetaminophen (TYLENOL) 500 MG tablet Take 1,000 mg by mouth every 6 (six) hours as needed.     aspirin 81 MG chewable tablet Chew 81 mg by mouth once as needed.     atorvastatin (LIPITOR) 40 MG tablet Take 1 tablet (40 mg total) by mouth daily. 30 tablet 11   cetirizine (ZYRTEC) 10 MG tablet Take 10 mg by mouth daily.     ALPRAZolam (XANAX) 0.5 MG tablet Take 1 tablet (0.5 mg total) by mouth 3 (three) times daily as needed for anxiety. (Patient not taking: Reported on 08/16/2020) 30 tablet 0   hydrOXYzine (ATARAX/VISTARIL) 50 MG tablet Take 1 tablet (50 mg total) by mouth 3 (three) times daily as needed for anxiety. (Patient not taking: Reported on 08/16/2020) 30 tablet 0   Vitamin D, Ergocalciferol, (DRISDOL) 1.25 MG (50000 UNIT) CAPS capsule Take 1 capsule (50,000 Units total) by mouth every 7 (seven) days. (Patient not taking: Reported on 08/16/2020) 12 capsule 1   No facility-administered medications prior to visit.    Allergies  Allergen Reactions   Triptans     Cannot use triptans for migraine per neurology due to stroke history   Morphine And Related Hives    Feels like skin is burning     ROS Review of Systems    Objective:    Physical Exam  BP 111/70 (BP Location: Right Arm, Patient Position: Sitting, Cuff Size: Large)   Pulse 75   Temp 97.7 F (36.5 C) (Temporal)   Ht 5\' 2"  (1.575 m)   Wt 174 lb (78.9 kg)   LMP 08/02/2020 (Approximate)   SpO2 97%   BMI 31.83 kg/m  Wt Readings from Last 3 Encounters:  08/16/20 174 lb (78.9 kg)  08/13/20 176 lb (79.8 kg)  02/02/20 170 lb (77.1 kg)     Health Maintenance Due  Topic Date Due   Hepatitis C Screening  Never done    There are no preventive care  reminders to display for this patient.  Lab Results  Component Value Date   TSH 1.46 09/05/2019   Lab Results  Component Value Date   WBC 4.9 08/12/2020   HGB 11.9 (L) 08/12/2020   HCT 35.5 (L) 08/12/2020   MCV 96.7 08/12/2020   PLT 206 08/12/2020   Lab Results  Component Value Date   NA 136 08/12/2020   K 3.5 08/12/2020   CO2 28 08/12/2020   GLUCOSE 96 08/12/2020   BUN 14 08/12/2020   CREATININE 0.86 08/12/2020  BILITOT 0.8 08/12/2020   ALKPHOS 48 08/12/2020   AST 23 08/12/2020   ALT 24 08/12/2020   PROT 6.7 08/12/2020   ALBUMIN 3.8 08/12/2020   CALCIUM 8.7 (L) 08/12/2020   ANIONGAP 3 (L) 08/12/2020   Lab Results  Component Value Date   CHOL 162 08/13/2020   Lab Results  Component Value Date   HDL 68 08/13/2020   Lab Results  Component Value Date   LDLCALC 88 08/13/2020   Lab Results  Component Value Date   TRIG 32 08/13/2020   Lab Results  Component Value Date   CHOLHDL 2.4 08/13/2020   Lab Results  Component Value Date   HGBA1C 5.8 (H) 08/13/2020      Assessment & Plan:   Problem List Items Addressed This Visit       Cardiovascular and Mediastinum   TIA (transient ischemic attack)     Other   HLD (hyperlipidemia)    Lab Results  Component Value Date   CHOL 162 08/13/2020   HDL 68 08/13/2020   LDLCALC 88 08/13/2020   TRIG 32 08/13/2020   CHOLHDL 2.4 08/13/2020  -continue atorvastatin       Other Visit Diagnoses     Conversion disorder    -  Primary       No orders of the defined types were placed in this encounter.   Follow-up: Return in about 1 month (around 09/16/2020) for Follow-up for neuro issues.    Heather Roberts, NP

## 2020-08-16 NOTE — Assessment & Plan Note (Signed)
Lab Results  Component Value Date   CHOL 162 08/13/2020   HDL 68 08/13/2020   LDLCALC 88 08/13/2020   TRIG 32 08/13/2020   CHOLHDL 2.4 08/13/2020   -continue atorvastatin

## 2020-08-16 NOTE — Addendum Note (Signed)
Addended by: Jerilynn Mages on: 08/16/2020 04:48 PM   Modules accepted: Orders

## 2020-08-16 NOTE — Telephone Encounter (Signed)
Transition Care Management Follow-up Telephone Call Date of discharge and from where: 08/14/2020-[place]  How have you been since you were released from the hospital? Patient stated she is dong a lot better than yesterday.  Any questions or concerns? No  Items Reviewed: Did the pt receive and understand the discharge instructions provided? Yes  Medications obtained and verified? Yes  Other? No  Any new allergies since your discharge? No  Dietary orders reviewed? N/A Do you have support at home? Yes   Home Care and Equipment/Supplies: Were home health services ordered? not applicable If so, what is the name of the agency? N/A  Has the agency set up a time to come to the patient's home? not applicable Were any new equipment or medical supplies ordered?  No What is the name of the medical supply agency? N/A Were you able to get the supplies/equipment? not applicable Do you have any questions related to the use of the equipment or supplies? No  Functional Questionnaire: (I = Independent and D = Dependent) ADLs: I  Bathing/Dressing- I  Meal Prep- I  Eating- I  Maintaining continence- I  Transferring/Ambulation- I  Managing Meds- I  Follow up appointments reviewed:  PCP Hospital f/u appt confirmed? Yes  Scheduled to see Trellis Moment on 08/16/2020 @ 3:40 PM. Specialist Hospital f/u appt confirmed? Yes  Scheduled to see Neurology.  Are transportation arrangements needed? No  If their condition worsens, is the pt aware to call PCP or go to the Emergency Dept.? Yes Was the patient provided with contact information for the PCP's office or ED? Yes Was to pt encouraged to call back with questions or concerns? Yes

## 2020-08-17 DIAGNOSIS — F411 Generalized anxiety disorder: Secondary | ICD-10-CM | POA: Diagnosis not present

## 2020-08-17 DIAGNOSIS — Z733 Stress, not elsewhere classified: Secondary | ICD-10-CM | POA: Diagnosis not present

## 2020-08-17 DIAGNOSIS — R251 Tremor, unspecified: Secondary | ICD-10-CM | POA: Diagnosis not present

## 2020-08-17 DIAGNOSIS — I639 Cerebral infarction, unspecified: Secondary | ICD-10-CM | POA: Diagnosis not present

## 2020-08-27 ENCOUNTER — Other Ambulatory Visit: Payer: Self-pay

## 2020-08-27 ENCOUNTER — Telehealth: Payer: Self-pay

## 2020-08-27 DIAGNOSIS — Z8673 Personal history of transient ischemic attack (TIA), and cerebral infarction without residual deficits: Secondary | ICD-10-CM

## 2020-08-27 DIAGNOSIS — G459 Transient cerebral ischemic attack, unspecified: Secondary | ICD-10-CM

## 2020-08-27 DIAGNOSIS — G43019 Migraine without aura, intractable, without status migrainosus: Secondary | ICD-10-CM

## 2020-08-27 NOTE — Telephone Encounter (Signed)
Referral changed

## 2020-08-27 NOTE — Telephone Encounter (Signed)
Patient called asked to switch [facility] referral to [facility].

## 2020-08-28 ENCOUNTER — Other Ambulatory Visit: Payer: Self-pay

## 2020-08-28 ENCOUNTER — Telehealth: Payer: Medicaid Other | Admitting: Licensed Clinical Social Worker

## 2020-08-28 ENCOUNTER — Telehealth (INDEPENDENT_AMBULATORY_CARE_PROVIDER_SITE_OTHER): Payer: Medicaid Other | Admitting: Licensed Clinical Social Worker

## 2020-08-28 DIAGNOSIS — F419 Anxiety disorder, unspecified: Secondary | ICD-10-CM

## 2020-08-28 DIAGNOSIS — F449 Dissociative and conversion disorder, unspecified: Secondary | ICD-10-CM

## 2020-08-28 NOTE — BH Specialist Note (Signed)
[facility] Initial Clinical Assessment  MRN: [id number] NAME: Marissa [name] Date: 08/28/20  Start time:  905a End time:  935a Total time:  30 min Call number:  video visit Patient Location: home Clinician Location: home office  Type of Contact:  video Patient consent obtained:  yes Reason for Visit today:  initiate [facility] servie  Treatment History Patient recently received Inpatient Treatment:  no  Facility/Program:    Date of discharge:   Patient currently being seen by therapist/psychiatrist:   Patient currently receiving the following services:    Past Psychiatric History/Hospitalization(s): Anxiety: Yes Bipolar Disorder: No Depression: No Mania: No Psychosis: No Schizophrenia: No Personality Disorder: No Hospitalization for psychiatric illness: No History of Electroconvulsive Shock Therapy: No Prior Suicide Attempts: No  Clinical Assessment:  PHQ-9 Assessments: Depression screen [facility] 2/9 08/28/2020 08/16/2020 08/14/2020  Decreased Interest 0 0 0  Down, Depressed, Hopeless 0 1 0  PHQ - 2 Score 0 1 0  Altered sleeping 0 - -  Tired, decreased energy 0 - -  Change in appetite 0 - -  Feeling bad or failure about yourself  0 - -  Trouble concentrating 0 - -  Moving slowly or fidgety/restless 0 - -  Suicidal thoughts 0 - -  PHQ-9 Score 0 - -  Difficult doing work/chores Somewhat difficult - -    GAD-7 Assessments: GAD 7 : Generalized Anxiety Score 08/28/2020 08/30/2019 03/22/2019 12/08/2018  Nervous, Anxious, on Edge 2 2 1  0  Control/stop worrying 1 0 1 1  Worry too much - different things 0 0 0 1  Trouble relaxing 1 0 0 0  Restless 0 0 0 0  Easily annoyed or irritable 1 0 0 1  Afraid - awful might happen 1 0 0 1  Total GAD 7 Score 6 2 2 4   Anxiety Difficulty Somewhat difficult Not difficult at all Somewhat difficult Somewhat difficult     Social Functioning Social maturity:   Social judgement:    Stress Current stressors:   Familial stressors:    Sleep:   Appetite:   Coping ability:   Patient taking medications as prescribed:    Current medications:  Outpatient Encounter Medications as of 08/28/2020  Medication Sig   acetaminophen (TYLENOL) 500 MG tablet Take 1,000 mg by mouth every 6 (six) hours as needed.   ALPRAZolam (XANAX) 0.5 MG tablet Take 1 tablet (0.5 mg total) by mouth 3 (three) times daily as needed for anxiety. (Patient not taking: Reported on 08/16/2020)   aspirin 81 MG chewable tablet Chew 81 mg by mouth once as needed.   atorvastatin (LIPITOR) 40 MG tablet Take 1 tablet (40 mg total) by mouth daily.   cetirizine (ZYRTEC) 10 MG tablet Take 10 mg by mouth daily.   hydrOXYzine (ATARAX/VISTARIL) 50 MG tablet Take 1 tablet (50 mg total) by mouth 3 (three) times daily as needed for anxiety. (Patient not taking: Reported on 08/16/2020)   No facility-administered encounter medications on file as of 08/28/2020.    Self-harm Behaviors Risk Assessment Self-harm risk factors:   Patient endorses recent thoughts of harming self:    08/18/2020 Suicide Severity Rating Scale:  C-SRSS 06/28/2020 September 03, 2020  1. Wish to be Dead No No  2. Suicidal Thoughts No No  6. Suicide Behavior Question No No    Danger to Others Risk Assessment Danger to others risk factors:   Patient endorses recent thoughts of harming others:    Dynamic Appraisal of Situational Aggression (DASA): No flowsheet data found.  Substance Use Assessment Patient recently consumed alcohol:    Alcohol Use Disorder Identification Test (AUDIT): No flowsheet data found. Patient recently used drugs:    Opioid Risk Assessment:  Patient is concerned about dependence or abuse of substances:    ASAM Multidimensional Assessment Summary:  Dimension 1:    Dimension 1 Rating:    Dimension 2:    Dimension 2 Rating:    Dimension 3:    Dimension 3 Rating:    Dimension 4:    Dimension 4 Rating:    Dimension 5:    Dimension 5 Rating:    Dimension 6:    Dimension 6 Rating:    ASAM's Severity Rating Score:   ASAM Recommended Level of Treatment:     Goals, Interventions and Follow-up Plan Goals: Increase healthy adjustment to current life circumstances Interventions: Solution-Focused Strategies Follow-up Plan:  2 week session  Summary of Clinical Assessment Summary: Marissa [name] is a 40 yr old woman referred by Bjorn Pippin, NP to assist with anxiety.  Marissa [name] expressed that she had a seizure about 7 years ago and her anxiety possibly began then.  She reports that she was given medication by Tereasa Coop about 2 years ago that she did not take.  She had a recent ER visit after "feeling bad" while driving.  She was able to pull over and verbalize to her husband her symptoms.  She arrived home safely and the paramedics were called.  When they (paramedics) arrived, she fainted.  Upon discharge at the ER she was given Xanax and Hydroxyzine to take PRN.  She has not and has no desire to take the medication. She is open to having a conversation with Physician to learn how it will effect her since she has had a possible spot on her brain.  She is open to [facility] service and another opinion from a neurologist.    [name], LCSW

## 2020-08-30 ENCOUNTER — Encounter: Payer: Medicaid Other | Admitting: Family Medicine

## 2020-08-30 ENCOUNTER — Ambulatory Visit (INDEPENDENT_AMBULATORY_CARE_PROVIDER_SITE_OTHER): Payer: Medicaid Other | Admitting: Nurse Practitioner

## 2020-08-30 ENCOUNTER — Encounter: Payer: Self-pay | Admitting: Nurse Practitioner

## 2020-08-30 ENCOUNTER — Other Ambulatory Visit: Payer: Self-pay

## 2020-08-30 ENCOUNTER — Telehealth: Payer: Self-pay

## 2020-08-30 VITALS — BP 114/72 | HR 75 | Temp 96.9°F | Ht 62.0 in | Wt 177.0 lb

## 2020-08-30 DIAGNOSIS — Z139 Encounter for screening, unspecified: Secondary | ICD-10-CM

## 2020-08-30 DIAGNOSIS — F419 Anxiety disorder, unspecified: Secondary | ICD-10-CM | POA: Diagnosis not present

## 2020-08-30 DIAGNOSIS — E785 Hyperlipidemia, unspecified: Secondary | ICD-10-CM | POA: Diagnosis not present

## 2020-08-30 DIAGNOSIS — Z8673 Personal history of transient ischemic attack (TIA), and cerebral infarction without residual deficits: Secondary | ICD-10-CM

## 2020-08-30 DIAGNOSIS — Z0001 Encounter for general adult medical examination with abnormal findings: Secondary | ICD-10-CM | POA: Diagnosis not present

## 2020-08-30 NOTE — Assessment & Plan Note (Addendum)
-  takes ASA 81 mg daily; states that she feels cold -right eye had difficulty looking up and to the right on cranial nerve exam; unsure if this is new or related to previous CVA -she has neuro appt in about a month with [facility]

## 2020-08-30 NOTE — Assessment & Plan Note (Signed)
-  checking labs today 

## 2020-08-30 NOTE — Patient Instructions (Signed)
Please have fasting labs drawn today.  We will meet again in a few months to review lab work. Please have fasting labs drawn 2-3 days prior to your appointment so we can discuss the results during your office visit.

## 2020-08-30 NOTE — Telephone Encounter (Signed)
Pt informed

## 2020-08-30 NOTE — Progress Notes (Signed)
Established Patient Office Visit  Subjective:  Patient ID: Marissa [name], female    DOB: Jan 31, 1980  Age: 40 y.o. MRN: [id number]  CC:  Chief Complaint  Patient presents with   Annual Exam    CPE    HPI Marissa [name] presents for physical exam.  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 She has upcoming neurology appointment.  She states that yesterday she started seeing floaters and had some tremors and shaking. This lasted less than 5 minutes, and she states that she was conscious the whole time. Denies incontinence.  She had a visit with Elmyra Ricks, LCSW, and stated that she has no intention of starting the anxiety medication.  Past Medical History:  Diagnosis Date   Allergy    seasonal   Anxiety    Cerebrovascular accident (CVA) due to embolism ([place]) 10/08/2015   R  frontal   Common migraine with intractable migraine 11/08/2015   Encounter for gynecological examination with Papanicolaou smear of cervix 12/30/2018   Hyperlipidemia    Stroke ([place])    Stroke ([place])    Vitamin D deficiency     Past Surgical History:  Procedure Laterality Date   CESAREAN SECTION     PATENT FORAMEN OVALE CLOSURE  09/25/2015   TUBAL LIGATION      Family History  Problem Relation Age of Onset   Hypertension Mother    Heart disease Maternal Uncle        murmur   Heart disease Maternal Grandmother    COPD Maternal Grandfather    Cancer Maternal Grandfather        lung   Heart disease Maternal Aunt        murmur   Migraines Neg Hx     Social History   Socioeconomic History   Marital status: Married    Spouse name: Roderic Palau   Number of children: 3   Years of education: 12   Highest education level: Not on file  Occupational History   Occupation: Bakery assoc    Comment: [employer]  Tobacco Use   Smoking status: Never   Smokeless tobacco: Never   Substance and Sexual Activity   Alcohol use: No   Drug use: No   Sexual activity: Yes    Partners: Male    Birth control/protection: Surgical    Comment: tubal  Other Topics Concern   Not on file  Social History Narrative   Lives with husband and 3 children   Right-handed   Caffeine: rare   Social Determinants of Health   Financial Resource Strain: Not on file  Food Insecurity: Not on file  Transportation Needs: Not on file  Physical Activity: Not on file  Stress: Not on file  Social Connections: Not on file  Intimate Partner Violence: Not on file    Outpatient Medications Prior to Visit  Medication Sig Dispense Refill   acetaminophen (TYLENOL) 500 MG tablet Take 1,000 mg by mouth every 6 (six) hours as needed.     aspirin 81 MG chewable tablet Chew 81 mg by mouth once as needed.     atorvastatin (LIPITOR) 40 MG tablet Take 1 tablet (40 mg total) by mouth daily. 30 tablet 11   cetirizine (ZYRTEC) 10 MG tablet Take 10 mg by mouth daily.     ALPRAZolam (XANAX) 0.5 MG tablet Take 1 tablet (0.5 mg total) by mouth 3 (three) times  daily as needed for anxiety. (Patient not taking: Reported on 08/30/2020) 30 tablet 0   hydrOXYzine (ATARAX/VISTARIL) 50 MG tablet Take 1 tablet (50 mg total) by mouth 3 (three) times daily as needed for anxiety. (Patient not taking: Reported on 08/30/2020) 30 tablet 0   No facility-administered medications prior to visit.    Allergies  Allergen Reactions   Triptans     Cannot use triptans for migraine per neurology due to stroke history   Morphine And Related Hives    Feels like skin is burning     ROS Review of Systems  Constitutional: Negative.   HENT: Negative.    Eyes:  Positive for visual disturbance.       Saw floaters for less than 5 minutes yesterday; no issues today  Respiratory: Negative.    Cardiovascular: Negative.   Gastrointestinal: Negative.   Endocrine: Negative.   Genitourinary: Negative.   Musculoskeletal: Negative.    Skin: Negative.   Allergic/Immunologic: Negative.   Neurological:  Positive for tremors.       Reports she dad tremors for 5 minutes yesterday while seeing floaters, but no issues today  Hematological: Negative.   Psychiatric/Behavioral: Negative.       Objective:    Physical Exam Constitutional:      Appearance: Normal appearance.  HENT:     Head: Normocephalic and atraumatic.     Right Ear: Tympanic membrane, ear canal and external ear normal.     Left Ear: Tympanic membrane, ear canal and external ear normal.     Nose: Nose normal.     Mouth/Throat:     Mouth: Mucous membranes are moist.     Pharynx: Oropharynx is clear.  Eyes:     Extraocular Movements: Extraocular movements intact.     Conjunctiva/sclera: Conjunctivae normal.     Pupils: Pupils are equal, round, and reactive to light.  Cardiovascular:     Rate and Rhythm: Normal rate and regular rhythm.     Pulses: Normal pulses.     Heart sounds: Normal heart sounds.  Pulmonary:     Effort: Pulmonary effort is normal.     Breath sounds: Normal breath sounds.  Abdominal:     General: Abdomen is flat. Bowel sounds are normal.     Palpations: Abdomen is soft.  Musculoskeletal:        General: Normal range of motion.     Cervical back: Normal range of motion and neck supple.  Skin:    General: Skin is warm and dry.     Capillary Refill: Capillary refill takes less than 2 seconds.  Neurological:     General: No focal deficit present.     Mental Status: She is alert and oriented to person, place, and time.     Cranial Nerves: Cranial nerve deficit present.     Sensory: No sensory deficit.     Motor: No weakness.     Gait: Gait normal.     Comments: Has difficulty in right eye looking up and to the right  Psychiatric:        Mood and Affect: Mood normal.        Behavior: Behavior normal.        Thought Content: Thought content normal.        Judgment: Judgment normal.    BP 114/72 (BP Location: Right Arm,  Patient Position: Sitting, Cuff Size: Large)   Pulse 75   Temp (!) 96.9 F (36.1 C) (Temporal)   Ht '5\' 2"'  (1.575  m)   Wt 177 lb (80.3 kg)   LMP 08/02/2020 (Approximate)   SpO2 97%   BMI 32.37 kg/m  Wt Readings from Last 3 Encounters:  08/30/20 177 lb (80.3 kg)  08/16/20 174 lb (78.9 kg)  08/13/20 176 lb (79.8 kg)     Health Maintenance Due  Topic Date Due   Hepatitis C Screening  Never done    There are no preventive care reminders to display for this patient.  Lab Results  Component Value Date   TSH 1.46 09/05/2019   Lab Results  Component Value Date   WBC 4.9 08/12/2020   HGB 11.9 (L) 08/12/2020   HCT 35.5 (L) 08/12/2020   MCV 96.7 08/12/2020   PLT 206 08/12/2020   Lab Results  Component Value Date   NA 136 08/12/2020   K 3.5 08/12/2020   CO2 28 08/12/2020   GLUCOSE 96 08/12/2020   BUN 14 08/12/2020   CREATININE 0.86 08/12/2020   BILITOT 0.8 08/12/2020   ALKPHOS 48 08/12/2020   AST 23 08/12/2020   ALT 24 08/12/2020   PROT 6.7 08/12/2020   ALBUMIN 3.8 08/12/2020   CALCIUM 8.7 (L) 08/12/2020   ANIONGAP 3 (L) 08/12/2020   Lab Results  Component Value Date   CHOL 162 08/13/2020   Lab Results  Component Value Date   HDL 68 08/13/2020   Lab Results  Component Value Date   LDLCALC 88 08/13/2020   Lab Results  Component Value Date   TRIG 32 08/13/2020   Lab Results  Component Value Date   CHOLHDL 2.4 08/13/2020   Lab Results  Component Value Date   HGBA1C 5.8 (H) 08/13/2020      Assessment & Plan:   Problem List Items Addressed This Visit       Other   HLD (hyperlipidemia)    -checking labs today      Relevant Orders   Lipid Panel With LDL/HDL Ratio   CBC with Differential/Platelet   CMP14+EGFR   Lipid Panel With LDL/HDL Ratio   History of CVA (cerebrovascular accident)    -takes ASA 81 mg daily; states that she feels cold -right eye had difficulty looking up and to the right on cranial nerve exam; unsure if this is new or  related to previous CVA -she has neuro appt in about a month with [facility]      Relevant Orders   CBC with Differential/Platelet   CMP14+EGFR   Lipid Panel With LDL/HDL Ratio   CBC with Differential/Platelet   CMP14+EGFR   Lipid Panel With LDL/HDL Ratio   Anxiety    -was started on hydroxyzine and xanax by EDP, but she is not interested in starting these -she feels like she has a neuro problem and doesn't want psych meds for a neuro problem      Other Visit Diagnoses     Encounter for general adult medical examination with abnormal findings    -  Primary   Relevant Orders   CBC with Differential/Platelet   CMP14+EGFR   Lipid Panel With LDL/HDL Ratio   TSH   Screening due       Relevant Orders   Hepatitis C antibody       No orders of the defined types were placed in this encounter.   Follow-up: Return in about 6 months (around 03/02/2021) for Lab follow-up (HLD).    Noreene Larsson, NP

## 2020-08-30 NOTE — Assessment & Plan Note (Signed)
-  was started on hydroxyzine and xanax by EDP, but she is not interested in starting these -she feels like she has a neuro problem and doesn't want psych meds for a neuro problem

## 2020-08-30 NOTE — Telephone Encounter (Signed)
If she feels bad enough, she should go to the ED. She had some anemia on her labs from last month and she has been taking aspirin, but I don't have any lab results to say that has resolved, so if she feels bad enough, she should be checked out at ED where they can get rapid lab results and/or further imaging.

## 2020-08-30 NOTE — Telephone Encounter (Signed)
Pt called and said that after her visit today she took her Aspirin and ate and suddenly started to feel bad while at work. She said she is extremely cold, no fever, and feels very weak. She did have labs drawn today, 3 vials, and doesn't know if that as well as the Aspirin could have caused her to feel this way? She wants to know what you think, she does not really want to go to the ER. Please advise.

## 2020-08-31 LAB — CBC WITH DIFFERENTIAL/PLATELET
Basophils Absolute: 0 10*3/uL (ref 0.0–0.2)
Basos: 1 %
EOS (ABSOLUTE): 0.1 10*3/uL (ref 0.0–0.4)
Eos: 1 %
Hematocrit: 36.1 % (ref 34.0–46.6)
Hemoglobin: 11.9 g/dL (ref 11.1–15.9)
Immature Grans (Abs): 0 10*3/uL (ref 0.0–0.1)
Immature Granulocytes: 0 %
Lymphocytes Absolute: 1.6 10*3/uL (ref 0.7–3.1)
Lymphs: 47 %
MCH: 31.1 pg (ref 26.6–33.0)
MCHC: 33 g/dL (ref 31.5–35.7)
MCV: 94 fL (ref 79–97)
Monocytes Absolute: 0.2 10*3/uL (ref 0.1–0.9)
Monocytes: 6 %
Neutrophils Absolute: 1.6 10*3/uL (ref 1.4–7.0)
Neutrophils: 45 %
Platelets: 194 10*3/uL (ref 150–450)
RBC: 3.83 x10E6/uL (ref 3.77–5.28)
RDW: 12.5 % (ref 11.7–15.4)
WBC: 3.5 10*3/uL (ref 3.4–10.8)

## 2020-08-31 LAB — CMP14+EGFR
ALT: 26 IU/L (ref 0–32)
AST: 21 IU/L (ref 0–40)
Albumin/Globulin Ratio: 1.6 (ref 1.2–2.2)
Albumin: 4.1 g/dL (ref 3.8–4.8)
Alkaline Phosphatase: 51 IU/L (ref 44–121)
BUN/Creatinine Ratio: 13 (ref 9–23)
BUN: 11 mg/dL (ref 6–24)
Bilirubin Total: 0.7 mg/dL (ref 0.0–1.2)
CO2: 22 mmol/L (ref 20–29)
Calcium: 9.2 mg/dL (ref 8.7–10.2)
Chloride: 103 mmol/L (ref 96–106)
Creatinine, Ser: 0.85 mg/dL (ref 0.57–1.00)
Globulin, Total: 2.5 g/dL (ref 1.5–4.5)
Glucose: 84 mg/dL (ref 65–99)
Potassium: 4 mmol/L (ref 3.5–5.2)
Sodium: 138 mmol/L (ref 134–144)
Total Protein: 6.6 g/dL (ref 6.0–8.5)
eGFR: 89 mL/min/{1.73_m2} (ref >59)

## 2020-08-31 LAB — TSH: TSH: 2.98 u[IU]/mL (ref 0.450–4.500)

## 2020-08-31 LAB — LIPID PANEL WITH LDL/HDL RATIO
Cholesterol, Total: 161 mg/dL (ref 100–199)
HDL: 69 mg/dL (ref >39)
LDL Chol Calc (NIH): 83 mg/dL (ref 0–99)
LDL/HDL Ratio: 1.2 ratio (ref 0.0–3.2)
Triglycerides: 40 mg/dL (ref 0–149)
VLDL Cholesterol Cal: 9 mg/dL (ref 5–40)

## 2020-08-31 LAB — HEPATITIS C ANTIBODY: Hep C Virus Ab: <0.1 s/co ratio (ref 0.0–0.9)

## 2020-08-31 NOTE — Progress Notes (Signed)
Labs look great.

## 2020-09-02 ENCOUNTER — Encounter: Payer: Self-pay | Admitting: Emergency Medicine

## 2020-09-02 ENCOUNTER — Ambulatory Visit
Admission: EM | Admit: 2020-09-02 | Discharge: 2020-09-02 | Disposition: A | Discharge status: Home / Self Care | Payer: Medicaid Other | Attending: Family Medicine | Admitting: Family Medicine

## 2020-09-02 DIAGNOSIS — R531 Weakness: Secondary | ICD-10-CM | POA: Diagnosis not present

## 2020-09-02 DIAGNOSIS — B349 Viral infection, unspecified: Secondary | ICD-10-CM | POA: Diagnosis not present

## 2020-09-02 DIAGNOSIS — Z1152 Encounter for screening for COVID-19: Secondary | ICD-10-CM

## 2020-09-02 DIAGNOSIS — R2689 Other abnormalities of gait and mobility: Secondary | ICD-10-CM | POA: Diagnosis not present

## 2020-09-02 DIAGNOSIS — Z8673 Personal history of transient ischemic attack (TIA), and cerebral infarction without residual deficits: Secondary | ICD-10-CM | POA: Diagnosis not present

## 2020-09-02 DIAGNOSIS — R262 Difficulty in walking, not elsewhere classified: Secondary | ICD-10-CM | POA: Diagnosis not present

## 2020-09-02 DIAGNOSIS — J069 Acute upper respiratory infection, unspecified: Secondary | ICD-10-CM

## 2020-09-02 DIAGNOSIS — Z20822 Contact with and (suspected) exposure to covid-19: Secondary | ICD-10-CM | POA: Diagnosis not present

## 2020-09-02 DIAGNOSIS — R4182 Altered mental status, unspecified: Secondary | ICD-10-CM | POA: Diagnosis not present

## 2020-09-02 DIAGNOSIS — R059 Cough, unspecified: Secondary | ICD-10-CM

## 2020-09-02 MED ORDER — PROMETHAZINE-DM 6.25-15 MG/5ML PO SYRP: 5.0000 mL | ORAL_SOLUTION | Freq: Four times a day (QID) | ORAL | 0 refills | Status: DC | PRN

## 2020-09-02 NOTE — ED Triage Notes (Signed)
Cough, fever, chills, scratchy throat, fatigue and headache that started Thursday.

## 2020-09-02 NOTE — ED Provider Notes (Signed)
[facility]    CSN: [id number] Arrival date & time: 09/02/20  0808      History   Chief Complaint No chief complaint on file.   HPI Marissa [name] is a 40 y.o. female.   HPI Patient presents with URI symptoms including cough, sore throat, otalgia, nasal congestion, runny nose, and sinus pressure.  She has had fever for 1 day which is subsequently resolved.  No known specific exposures however patient works in Engineering ge[employer].  Denies worrisome symptoms of shortness of breath, weakness, N&V, or chest pain .  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) 10/08/2015   R  frontal   Common migraine with intractable migraine 11/08/2015   Encounter for gynecological examination with Papanicolaou smear of cervix 12/30/2018   Hyperlipidemia    Stroke [facility])    Stroke [facility])    Vitamin D deficiency     Patient Active Problem List   Diagnosis Date Noted   TIA (transient ischemic attack) 08/13/2020   Annual visit for general adult medical examination with abnormal findings 08/30/2019   History of CVA (cerebrovascular accident) 12/09/2018   Anxiety 12/09/2018   Obesity (BMI 30.0-34.9) 12/09/2018   Common migraine with intractable migraine 11/08/2015   PFO (patent foramen ovale) 10/08/2015   HLD (hyperlipidemia) 10/08/2015   Vitamin D deficiency 10/08/2015    Past Surgical History:  Procedure Laterality Date   CESAREAN SECTION     PATENT FORAMEN OVALE CLOSURE  09/25/2015   TUBAL LIGATION      OB History     Gravida  3   Para  3   Term  2   Preterm  1   AB      Living  3      SAB      IAB      Ectopic      Multiple      Live Births               Home Medications    Prior to Admission medications   Medication Sig Start Date End Date Taking? Authorizing Provider  promethazine-dextromethorphan (PROMETHAZINE-DM) 6.25-15 MG/5ML syrup Take 5 mLs by mouth 4 (four)  times daily as needed for cough. 09/02/20  Yes Bing Neighbors, FNP  acetaminophen (TYLENOL) 500 MG tablet Take 1,000 mg by mouth every 6 (six) hours as needed.    [provider]  aspirin 81 MG chewable tablet Chew 81 mg by mouth once as needed.    [provider]  atorvastatin (LIPITOR) 40 MG tablet Take 1 tablet (40 mg total) by mouth daily. 08/14/20 09/13/20  Kendell Bane, MD  cetirizine (ZYRTEC) 10 MG tablet Take 10 mg by mouth daily.    [provider]    Family History Family History  Problem Relation Age of Onset   Hypertension Mother    Heart disease Maternal Uncle        murmur   Heart disease Maternal Grandmother    COPD Maternal Grandfather    Cancer Maternal Grandfather        lung   Heart disease Maternal Aunt        murmur   Migraines Neg Hx     Social History Social History   Tobacco Use   Smoking status: Never   Smokeless tobacco: Never  Substance Use Topics   Alcohol use: No   Drug  use: No     Allergies   Triptans and Morphine and related   Review of Systems Review of Systems Pertinent negatives listed in HPI   Physical Exam Triage Vital Signs ED Triage Vitals  Enc Vitals Group     BP 09/02/20 0840 134/86     Pulse Rate 09/02/20 0840 92     Resp 09/02/20 0840 17     Temp 09/02/20 0840 99 F (37.2 C)     Temp Source 09/02/20 0840 Oral     SpO2 09/02/20 0840 99 %     Weight --      Height --      Head Circumference --      Peak Flow --      Pain Score 09/02/20 0848 0     Pain Loc --      Pain Edu? --      Excl. in GC? --    No data found.  Updated Vital Signs BP 134/86 (BP Location: Right Arm)   Pulse 92   Temp 99 F (37.2 C) (Oral)   Resp 17   LMP 08/06/2020 (Approximate)   SpO2 99%   Visual Acuity Right Eye Distance:   Left Eye Distance:   Bilateral Distance:    Right Eye Near:   Left Eye Near:    Bilateral Near:     Physical Exam  General Appearance:    Alert, cooperative, no  distress  HENT:   Normocephalic, ears normal, nares mucosal edema with congestion, rhinorrhea, oropharynx clear   Eyes:    PERRL, conjunctiva/corneas clear, EOM's intact       Lungs:     Clear to auscultation bilaterally, respirations unlabored  Heart:    Regular rate and rhythm  Neurologic:   Awake, alert, oriented x 3. No apparent focal neurological           defect.      UC Treatments / Results  Labs (all labs ordered are listed, but only abnormal results are displayed) Labs Reviewed  COVID-19, FLU A+B NAA    EKG   Radiology No results found.  Procedures Procedures (including critical care time)  Medications Ordered in UC Medications - No data to display  Initial Impression / Assessment and Plan / UC Course  I have reviewed the triage vital signs and the nursing notes.  Pertinent labs & imaging results that were available during my care of the patient were reviewed by me and considered in my medical decision making (see chart for details).     COVID/Flu test pending. Symptom management warranted only.  Manage fever with Tylenol and ibuprofen.  Nasal symptoms with over-the-counter antihistamines recommended.  Treatment per discharge medications/discharge instructions.  Red flags/ER precautions given. The most current CDC isolation/quarantine recommendation advised.   Final Clinical Impressions(s) / UC Diagnoses   Final diagnoses:  Encounter for screening for COVID-19  Viral URI with cough     Discharge Instructions      Your COVID 19 results should result within 2-4 days. Negative results are immediately resulted to Mychart. Positive results will receive a follow-up call from our clinic. If symptoms are present, I recommend home quarantine until results are known.  Alternate Tylenol and ibuprofen as needed for body aches and fever.  Symptom management per recommendations discussed today.  If any breathing difficulty or chest pain develops go immediately to the  closest emergency department for evaluation.      ED Prescriptions     Medication Sig Dispense  Auth. Provider   promethazine-dextromethorphan (PROMETHAZINE-DM) 6.25-15 MG/5ML syrup Take 5 mLs by mouth 4 (four) times daily as needed for cough. 140 mL Bing Neighbors, FNP      PDMP not reviewed this encounter.   Bing Neighbors, [place] 09/02/20 [phone]

## 2020-09-02 NOTE — Discharge Instructions (Addendum)
Your COVID 19 results should result within 2-4 days. Negative results are immediately resulted to Mychart. Positive results will receive a follow-up call from our clinic. If symptoms are present, I recommend home quarantine until results are known.  Alternate Tylenol and ibuprofen as needed for body aches and fever.  Symptom management per recommendations discussed today.  If any breathing difficulty or chest pain develops go immediately to the closest emergency department for evaluation.  

## 2020-09-03 ENCOUNTER — Emergency Department (HOSPITAL_COMMUNITY): Payer: Medicaid Other

## 2020-09-03 ENCOUNTER — Observation Stay (HOSPITAL_COMMUNITY)
Admission: EM | Admit: 2020-09-03 | Discharge: 2020-09-04 | Disposition: A | Discharge status: Home / Self Care | Payer: Medicaid Other | Attending: Family Medicine | Admitting: Family Medicine

## 2020-09-03 DIAGNOSIS — Z8673 Personal history of transient ischemic attack (TIA), and cerebral infarction without residual deficits: Secondary | ICD-10-CM | POA: Insufficient documentation

## 2020-09-03 DIAGNOSIS — Z79899 Other long term (current) drug therapy: Secondary | ICD-10-CM | POA: Insufficient documentation

## 2020-09-03 DIAGNOSIS — R531 Weakness: Secondary | ICD-10-CM | POA: Diagnosis not present

## 2020-09-03 DIAGNOSIS — R0902 Hypoxemia: Secondary | ICD-10-CM | POA: Diagnosis not present

## 2020-09-03 DIAGNOSIS — Z7982 Long term (current) use of aspirin: Secondary | ICD-10-CM | POA: Insufficient documentation

## 2020-09-03 DIAGNOSIS — R4781 Slurred speech: Secondary | ICD-10-CM | POA: Diagnosis not present

## 2020-09-03 DIAGNOSIS — Z20822 Contact with and (suspected) exposure to covid-19: Secondary | ICD-10-CM | POA: Insufficient documentation

## 2020-09-03 DIAGNOSIS — R930 Abnormal findings on diagnostic imaging of skull and head, not elsewhere classified: Secondary | ICD-10-CM | POA: Diagnosis present

## 2020-09-03 DIAGNOSIS — R479 Unspecified speech disturbances: Secondary | ICD-10-CM | POA: Diagnosis present

## 2020-09-03 DIAGNOSIS — R9431 Abnormal electrocardiogram [ECG] [EKG]: Secondary | ICD-10-CM | POA: Diagnosis not present

## 2020-09-03 DIAGNOSIS — R2990 Unspecified symptoms and signs involving the nervous system: Secondary | ICD-10-CM

## 2020-09-03 DIAGNOSIS — R4689 Other symptoms and signs involving appearance and behavior: Secondary | ICD-10-CM

## 2020-09-03 DIAGNOSIS — I639 Cerebral infarction, unspecified: Secondary | ICD-10-CM | POA: Diagnosis not present

## 2020-09-03 DIAGNOSIS — I6340 Cerebral infarction due to embolism of unspecified cerebral artery: Secondary | ICD-10-CM | POA: Diagnosis not present

## 2020-09-03 DIAGNOSIS — R4701 Aphasia: Secondary | ICD-10-CM

## 2020-09-03 DIAGNOSIS — G459 Transient cerebral ischemic attack, unspecified: Secondary | ICD-10-CM | POA: Diagnosis not present

## 2020-09-03 LAB — URINALYSIS, ROUTINE W REFLEX MICROSCOPIC

## 2020-09-03 LAB — DIFFERENTIAL
Abs Immature Granulocytes: 0.01 10*3/uL (ref 0.00–0.07)
Basophils Absolute: 0 10*3/uL (ref 0.0–0.1)
Basophils Relative: 1 %
Eosinophils Absolute: 0.3 10*3/uL (ref 0.0–0.5)
Eosinophils Relative: 8 %
Immature Granulocytes: 0 %
Lymphocytes Relative: 41 %
Lymphs Abs: 1.7 10*3/uL (ref 0.7–4.0)
Monocytes Absolute: 0.4 10*3/uL (ref 0.1–1.0)
Monocytes Relative: 9 %
Neutro Abs: 1.7 10*3/uL (ref 1.7–7.7)
Neutrophils Relative %: 41 %

## 2020-09-03 LAB — CBC
HCT: 40.8 % (ref 36.0–46.0)
Hemoglobin: 13.3 g/dL (ref 12.0–15.0)
MCH: 32.4 pg (ref 26.0–34.0)
MCHC: 32.6 g/dL (ref 30.0–36.0)
MCV: 99.5 fL (ref 80.0–100.0)
Platelets: 197 10*3/uL (ref 150–400)
RBC: 4.1 MIL/uL (ref 3.87–5.11)
RDW: 13.2 % (ref 11.5–15.5)
WBC: 4.1 10*3/uL (ref 4.0–10.5)
nRBC: 0 % (ref 0.0–0.2)

## 2020-09-03 LAB — RAPID URINE DRUG SCREEN, HOSP PERFORMED
Amphetamines: NOT DETECTED
Barbiturates: NOT DETECTED
Benzodiazepines: NOT DETECTED
Cocaine: NOT DETECTED
Opiates: NOT DETECTED
Tetrahydrocannabinol: NOT DETECTED

## 2020-09-03 LAB — PROTIME-INR
INR: 1 (ref 0.8–1.2)
Prothrombin Time: 13.3 seconds (ref 11.4–15.2)

## 2020-09-03 LAB — URINALYSIS, MICROSCOPIC (REFLEX): RBC / HPF: >50 RBC/hpf (ref 0–5)

## 2020-09-03 LAB — I-STAT CHEM 8, ED
BUN: 13 mg/dL (ref 6–20)
Calcium, Ion: 1.23 mmol/L (ref 1.15–1.40)
Chloride: 103 mmol/L (ref 98–111)
Creatinine, Ser: 0.9 mg/dL (ref 0.44–1.00)
Glucose, Bld: 86 mg/dL (ref 70–99)
HCT: 37 % (ref 36.0–46.0)
Hemoglobin: 12.6 g/dL (ref 12.0–15.0)
Potassium: 3.5 mmol/L (ref 3.5–5.1)
Sodium: 140 mmol/L (ref 135–145)
TCO2: 28 mmol/L (ref 22–32)

## 2020-09-03 LAB — COMPREHENSIVE METABOLIC PANEL
ALT: 24 U/L (ref 0–44)
AST: 21 U/L (ref 15–41)
Albumin: 4.1 g/dL (ref 3.5–5.0)
Alkaline Phosphatase: 54 U/L (ref 38–126)
Anion gap: 7 (ref 5–15)
BUN: 14 mg/dL (ref 6–20)
CO2: 25 mmol/L (ref 22–32)
Calcium: 9 mg/dL (ref 8.9–10.3)
Chloride: 104 mmol/L (ref 98–111)
Creatinine, Ser: 0.79 mg/dL (ref 0.44–1.00)
GFR, Estimated: >60 mL/min (ref >60)
Glucose, Bld: 80 mg/dL (ref 70–99)
Potassium: 3.5 mmol/L (ref 3.5–5.1)
Sodium: 136 mmol/L (ref 135–145)
Total Bilirubin: 0.7 mg/dL (ref 0.3–1.2)
Total Protein: 7.6 g/dL (ref 6.5–8.1)

## 2020-09-03 LAB — COVID-19, FLU A+B NAA
Influenza A, NAA: NOT DETECTED
Influenza B, NAA: NOT DETECTED
SARS-CoV-2, NAA: NOT DETECTED

## 2020-09-03 LAB — CBG MONITORING, ED: Glucose-Capillary: 98 mg/dL (ref 70–99)

## 2020-09-03 LAB — RESP PANEL BY RT-PCR (FLU A&B, COVID) ARPGX2
Influenza A by PCR: NEGATIVE
Influenza B by PCR: NEGATIVE
SARS Coronavirus 2 by RT PCR: NEGATIVE

## 2020-09-03 LAB — APTT: aPTT: 29 seconds (ref 24–36)

## 2020-09-03 LAB — POC URINE PREG, ED: Preg Test, Ur: NEGATIVE

## 2020-09-03 IMAGING — CT CT HEAD CODE STROKE
3 series · 15 of 47 positions shown, 18 images · non-contrast
Comparison: [DATE]

CLINICAL DATA: Code stroke. Neuro deficit, acute, stroke suspected;
abnormal speech

EXAM:
CT HEAD WITHOUT CONTRAST
TECHNIQUE: Contiguous axial images were obtained from the base of the skull
through the vertex without intravenous contrast.

[Series 2: head w o · axial · 0.47mm/px · z∈[+1330,+1470]mm · 9 of 34 slices shown, 12 images]
[im 3/34  brain]
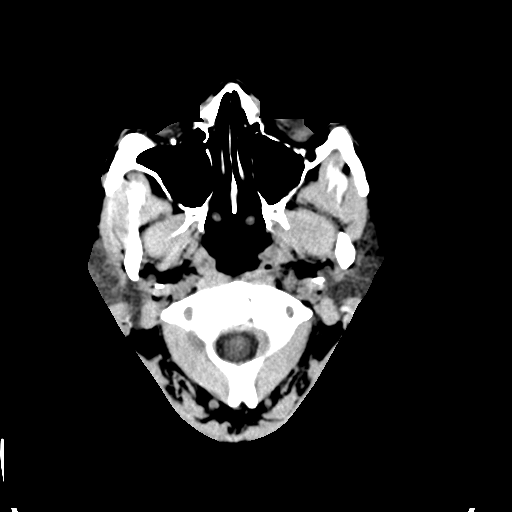
[im 3/34  bone]
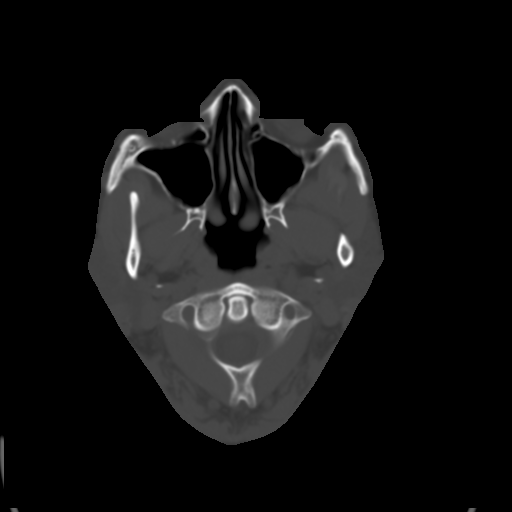
[im 6/34  brain]
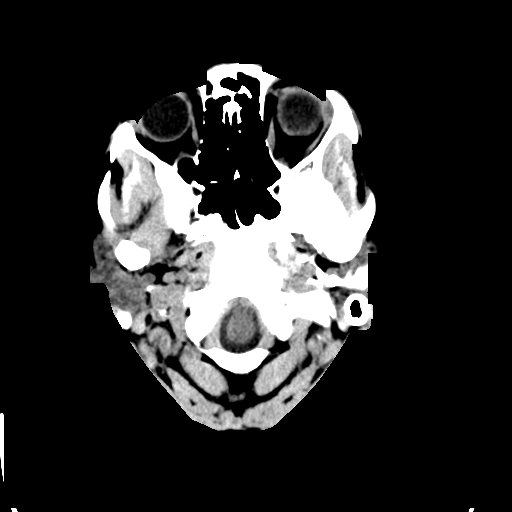
[im 10/34  brain]
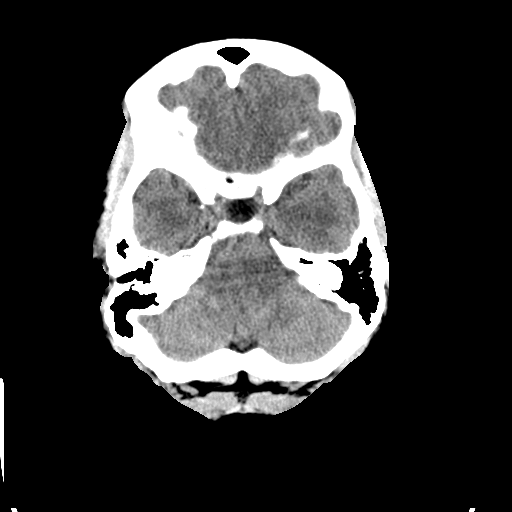
[im 13/34  brain]
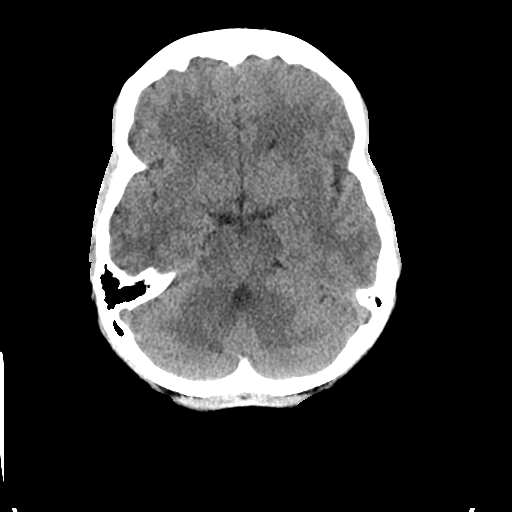
[im 18/34  brain]
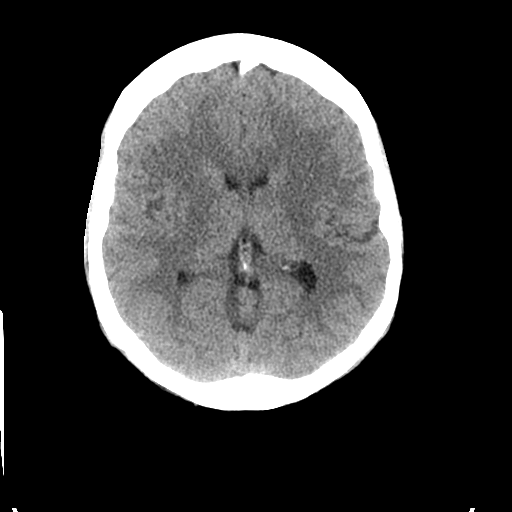
[im 18/34  bone]
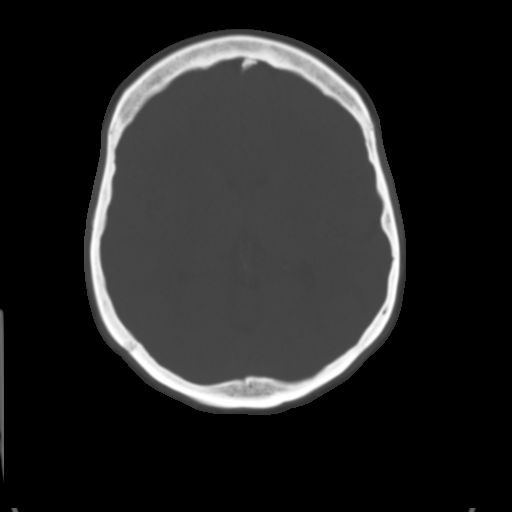
[im 21/34  brain]
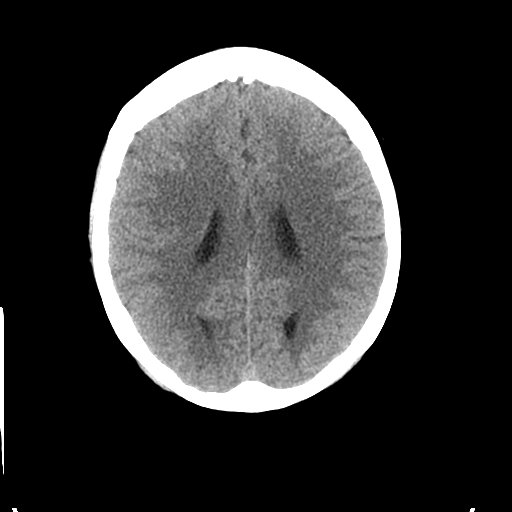
[im 24/34  brain]
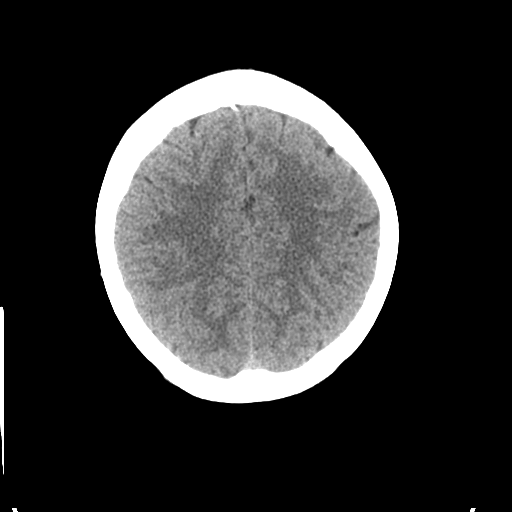
[im 28/34  brain]
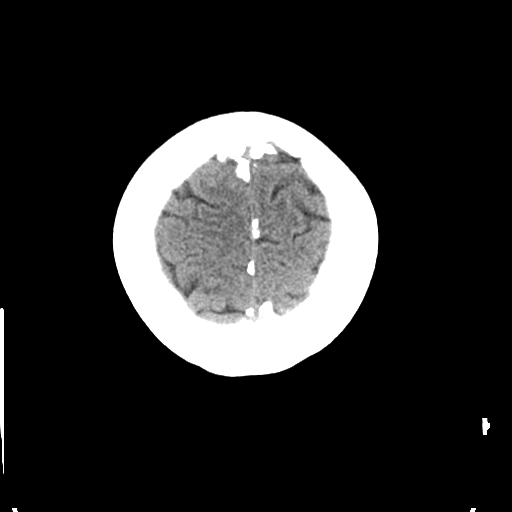
[im 31/34  brain]
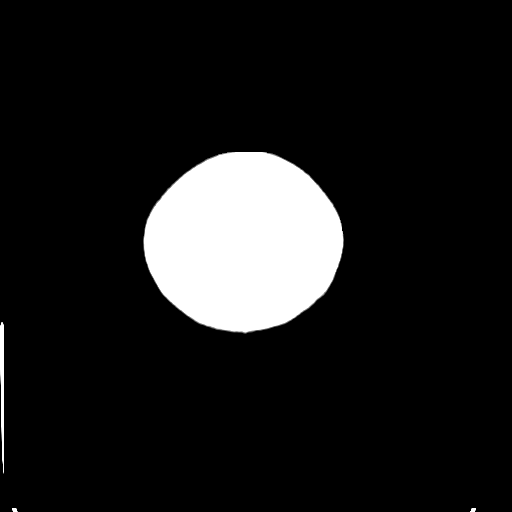
[im 31/34  bone]
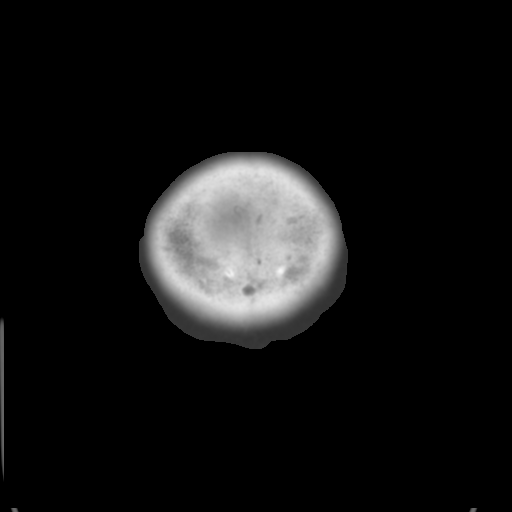

[Series 4: coronal soft · coronal · 0.35mm/px · 3 of 67 slices shown]
[im 23/67  brain]
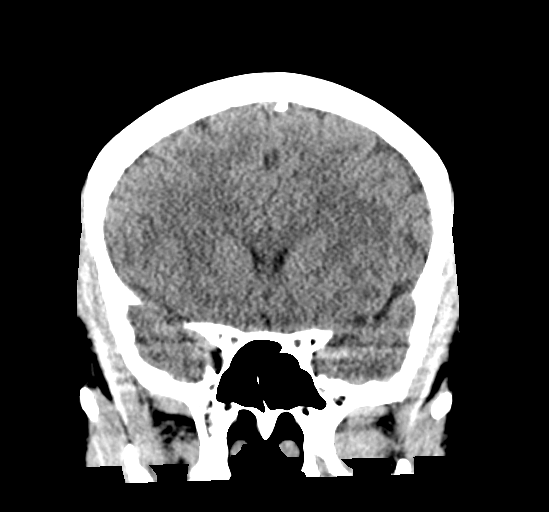
[im 30/67  brain]
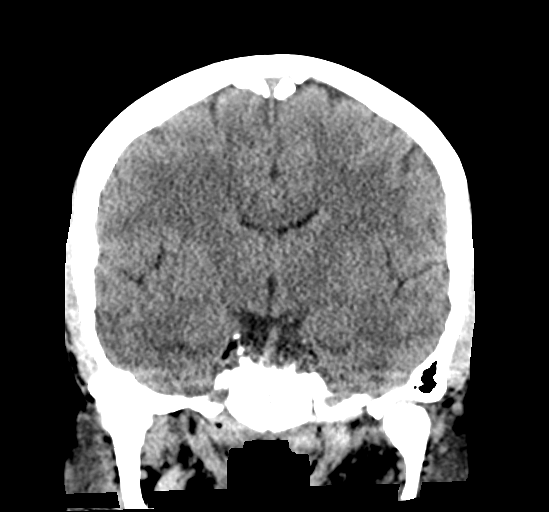
[im 37/67  brain]
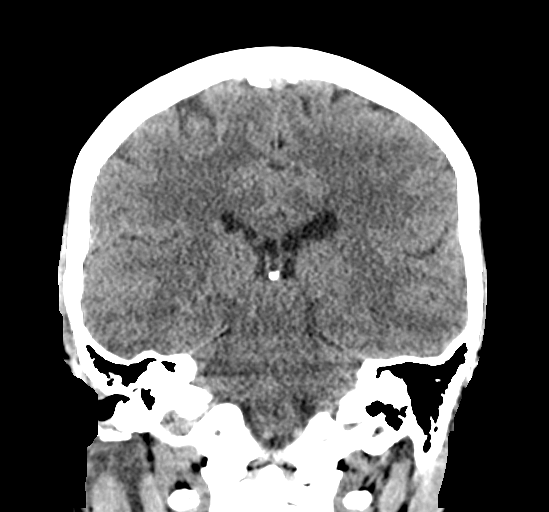

[Series 5: sagittal soft · sagittal · 0.33mm/px · 3 of 55 slices shown]
[im 19/55  brain]
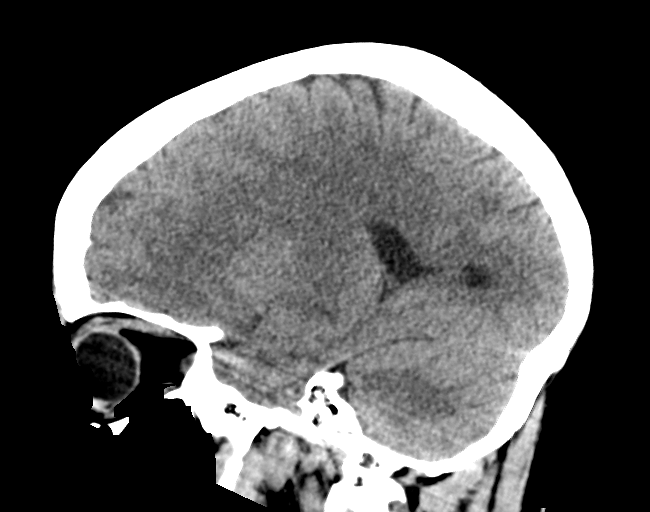
[im 28/55  brain]
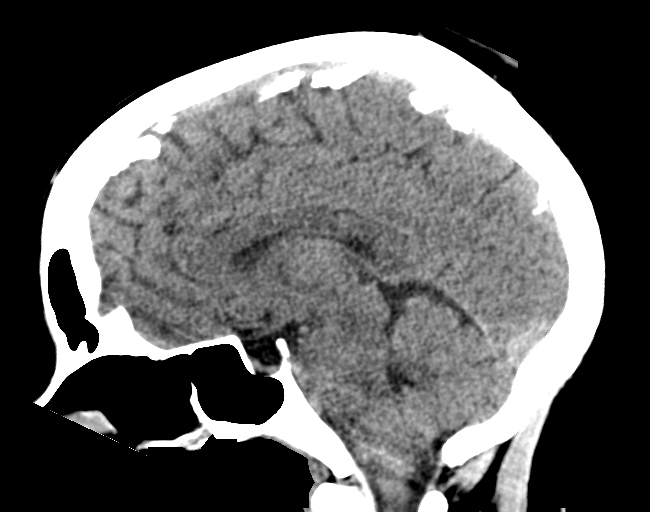
[im 37/55  brain]
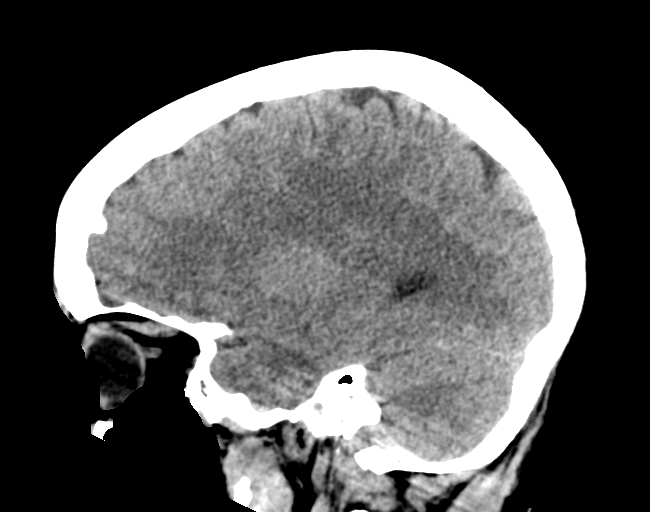

[15 of 47 positions shown; findings below may reference images not displayed]

FINDINGS: Brain: There is no acute intracranial hemorrhage, mass effect, or
edema. Gray-white differentiation is preserved. Ventricles and sulci
are normal in size and configuration. No extra-axial collection.

Vascular: No hyperdense vessel.

Skull: No new abnormality.

Sinuses/Orbits: No new abnormality.

Other: Mastoid air cells are clear.

ASPECTS (Alberta Stroke Program Early CT Score)

- Ganglionic level infarction (caudate, lentiform nuclei, internal
capsule, insula, M1-M3 cortex): 7

- Supraganglionic infarction (M4-M6 cortex): 3

Total score (0-10 with 10 being normal): 10
IMPRESSION: There is no acute intracranial hemorrhage or evidence of acute
infarction. ASPECT score is 10.

These results were called by telephone at the time of interpretation
on [DATE] at [DATE] to provider ADRIC , who verbally
acknowledged these results.

## 2020-09-03 MED ORDER — ACETAMINOPHEN 325 MG PO TABS
650.0000 mg | ORAL_TABLET | Freq: Four times a day (QID) | ORAL | Status: DC | PRN
  Administered 2020-09-03: 650 mg via ORAL
  Filled 2020-09-03: qty 2

## 2020-09-03 MED ORDER — SODIUM CHLORIDE 0.9% FLUSH
3.0000 mL | Freq: Once | INTRAVENOUS | Status: AC
  Administered 2020-09-03: 3 mL via INTRAVENOUS

## 2020-09-03 MED ORDER — LORAZEPAM 2 MG/ML IJ SOLN
1.0000 mg | Freq: Once | INTRAMUSCULAR | Status: DC | PRN
Start: 2020-09-03 — End: 2020-09-05

## 2020-09-03 NOTE — ED Notes (Signed)
Nurse called into room by patient and family stating she is having "another episode". Pt states she is having uncontrollable blinking and tingling to her right leg. Nurse stayed with pt and Dr. Jacqulyn Bath called into room. Same episode of "blinking" lasted approx 4 minutes and resolved.

## 2020-09-03 NOTE — Progress Notes (Signed)
Since she is still seeing neurology and they were concerned with CVA/TIA, I would continue the aspirin. The iron, she can stop that, and we can start it back if anemia returns.

## 2020-09-03 NOTE — ED Notes (Signed)
Pt speech has also resolved to normal at this time.

## 2020-09-03 NOTE — Consult Note (Signed)
NEUROLOGY TELECONSULTATION NOTE   Date of service: September 03, 2020 Patient Name: Marissa [name] MRN:  [id number] DOB:  1980/07/12 Reason for consult: speech abnormality, BLE weakness  Requesting Provider: Dr. Alona Bene Consult Participants: myself, patient, bedside RN, telestroke RN Location of the provider: [place], [place] Location of the patient: Marissa [name]  This consult was provided via telemedicine with 2-way video and audio communication. The patient/family was informed that care would be provided in this way and agreed to receive care in this manner.   _ _ _   _ __   _ __ _ _  __ __   _ __   __ _  History of Present Illness    This is a 40 yo woman with hx possible small ischemic stroke July 2022 and migraine who was BIB EMS to [facility] ED for difficulty speaking and rapid blinking. Hx is difficult to obtain due to unusual speech pattern in which she can only say one word every few seconds. She was seen in July for abnormality on brain MRI after she presented with an episode of difficulty talking, numbness all over, and shaking on the R side. MRI brain showed small area of restricted diffusion in R medial temporal region with corresponding reduced signal on ADC. Dr. Gerilyn Pilgrim felt this was most likely 2/2 small acute ischemic stroke vs effect after focal seizure and patient was started on ASA 81mg  daily. She stopped the aspirin last week because it was making her "feel cold" but she had no actual allergic reaction to it.   Yesterday she was seen at [facility] ED for difficulty ambulating x1-2 wks and URI sx. Per [facility] ED note she recently had full workup for those sx including CT, MRI, labs, and neurologic consultation and workup was unrevealing. She called EMS tonight for sx of rapid blinking and this abnormal speech pattern. She cannot tell me when sx began more specifically than "a little while ago" but EMS reported they began at 1840 tonight.  Given concern for recent acute infarct and neurologic  sx over the past 2 weeks she is not a candidate for tPA. NIHSS = 7 for L facial droop and no movement against gravity BLE. Given tele-exam was not c/w acute infarct CTA was not performed.  Per chart review patient has a history of significant anxiety related to her health and concern on behalf of her providers for somatization.    ROS   Per HPI; all other systems reviewed and are negative  Past History   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) 10/08/2015   R  frontal   Common migraine with intractable migraine 11/08/2015   Encounter for gynecological examination with Papanicolaou smear of cervix 12/30/2018   Hyperlipidemia    Stroke (HCC)    Stroke (HCC)    Vitamin D deficiency    Past Surgical History:  Procedure Laterality Date   CESAREAN SECTION     PATENT FORAMEN OVALE CLOSURE  09/25/2015   TUBAL LIGATION     Family History  Problem Relation Age of Onset   Hypertension Mother    Heart disease Maternal Uncle        murmur   Heart disease Maternal Grandmother    COPD Maternal Grandfather    Cancer Maternal Grandfather        lung   Heart disease Maternal Aunt  murmur   Migraines Neg Hx    Social History   Socioeconomic History   Marital status: Married    Spouse name: Christiane Ha   Number of children: 3   Years of education: 12   Highest education level: Not on file  Occupational History   Occupation: Bakery assoc    Comment: [employer]  Tobacco Use   Smoking status: Never   Smokeless tobacco: Never  Substance and Sexual Activity   Alcohol use: No   Drug use: No   Sexual activity: Yes    Partners: Male    Birth control/protection: Surgical    Comment: tubal  Other Topics Concern   Not on file  Social History Narrative   Lives with husband and 3 children   Right-handed   Caffeine: rare   Social Determinants of Health   Financial Resource Strain: Not on file   Food Insecurity: Not on file  Transportation Needs: Not on file  Physical Activity: Not on file  Stress: Not on file  Social Connections: Not on file   Allergies  Allergen Reactions   Triptans     Cannot use triptans for migraine per neurology due to stroke history   Morphine And Related Hives    Feels like skin is burning     Medications   (Not in a hospital admission)    Vitals   Vitals:   09/03/20 1957 09/03/20 2005  BP: 133/85 (!) 146/75  Pulse: 84 88  Resp: 12 16  SpO2: 100% 98%     There is no height or weight on file to calculate BMI.  Physical Exam   Exam performed over telemedicine with 2-way video and audio communication and with assistance of bedside RN  Physical Exam Gen: A&O x4, NAD Resp: normal WOB CV: extremities appear well-perfused  Neuro: *MS: A&O x4. Follows multi-step commands.  *Speech: nondysarthric, no aphasia, able to name and repeat. Her words are well articulated without dysarthria or dysphagia but she does have an unusual speech pattern where she is only able to say one word at a time every few seconds.  *CN: PERRL 41mm, EOMI, VFF by confrontation, sensation intact, L UMN facial droop, hearing intact to voice *Motor:   Normal bulk.  No tremor, rigidity or bradykinesia. No pronator drift. BUE appear full-strength and symmetric. BLE some movement but none against gravity, symmetric. *Sensory: SILT. Symmetric. No double-simultaneous extinction.  *Coordination:  FNF intact bilat, UTA HTS 2/2 BLE weakness. *Reflexes:  UTA 2/2 tele-exam *Gait: deferred  NIHSS = 7 (1 facial droop, 3 each motor BLE) at 2005 on 09/03/20   Premorbid mRS = 0 baseline, 3 past 1-2 wks   Labs   CBC:  Recent Labs  Lab 08/30/20 0901 09/03/20 1954 09/03/20 1956  WBC 3.5  --  4.1  NEUTROABS 1.6  --  1.7  HGB 11.9 12.6 13.3  HCT 36.1 37.0 40.8  MCV 94  --  99.5  PLT 194  --  197    Basic Metabolic Panel:  Lab Results  Component Value Date   NA 140  09/03/2020   K 3.5 09/03/2020   CO2 22 08/30/2020   GLUCOSE 86 09/03/2020   BUN 13 09/03/2020   CREATININE 0.90 09/03/2020   CALCIUM 9.2 08/30/2020   GFRNONAA >60 08/12/2020   GFRAA 106 09/05/2019   Lipid Panel:  Lab Results  Component Value Date   LDLCALC 83 08/30/2020   HgbA1c:  Lab Results  Component Value Date   HGBA1C 5.8 (  H) 08/13/2020   Urine Drug Screen:     Component Value Date/Time   LABOPIA NONE DETECTED 08/12/2020 2200   COCAINSCRNUR NONE DETECTED 08/12/2020 2200   LABBENZ NONE DETECTED 08/12/2020 2200   AMPHETMU NONE DETECTED 08/12/2020 2200   THCU NONE DETECTED 08/12/2020 2200   LABBARB NONE DETECTED 08/12/2020 2200    Alcohol Level     Component Value Date/Time   [facility] <10 08/12/2020 2120     Impression   This is a 40 yo woman with hx possible small ischemic stroke July 2022 and migraine who was BIB EMS to [facility] ED for difficulty speaking and rapid blinking. Her exam over tele seems largely functional, specifically her speech pattern. I discussed her exam with Dr. Jacqulyn Bath in ED who examined her multiple times. He reported improvement in her speech and BLE weakness after the tele exam ended, which is reassuring against continued evolution of BLE weakness over 2 weeks being consistent with GBS. As such I do not feel LP is indicated at this time. Dr. Jacqulyn Bath stated that after improving she did have another "episode" in the ED tonight during which her exam seemed similar to that which I saw on tele. Given that she is waxing and waning I do think it is reasonable to admit her for repeat EEG and repeat brain MRI, this time with and without contrast, to clarify the R temporal lesion seen on MRI brain in September and to further evaluate if some of this could be 2/2 seizures.  Recommendations   - Admit to hospitalist service - Brain MRI with and without contrast - rEEG - In-house neurology consult when available - Continue ASA 81mg   daily ______________________________________________________________________   Thank you for the opportunity to take part in the care of this patient. If you have any further questions, please contact the neurology consultation attending.  Signed,  , MD [facility] [phone]  If 7pm- 7am, please page neurology on call as listed in [facility].

## 2020-09-03 NOTE — ED Provider Notes (Signed)
Emergency Department Provider Note   I have reviewed the triage vital signs and the nursing notes.   HISTORY  Chief Complaint Code Stroke   HPI Marissa [name] is a 40 y.o. female with past medical history reviewed below returns to the emergency department by EMS with concern for possible stroke.  Patient is having speech difficulty and weakness in the bilateral legs.  Family reported onset just prior to calling EMS.  Code stroke activated and patient sent immediately to CT scan.  Chart review the patient has had episodes like this in the past with concern for stroke found on MRI.  In further discussion with family it sounds like these events are episodic.  Chart review describes possibility of conversion disorder/anxiety as well.   Level 5 caveat: dysarthria.   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) 10/08/2015   R  frontal   Common migraine with intractable migraine 11/08/2015   Encounter for gynecological examination with Papanicolaou smear of cervix 12/30/2018   Hyperlipidemia    Stroke [facility])    Stroke [facility])    Vitamin D deficiency     Patient Active Problem List   Diagnosis Date Noted   Speech disturbance 09/03/2020   Spell of abnormal behavior    Abnormal MRI of head    TIA (transient ischemic attack) 08/13/2020   Annual visit for general adult medical examination with abnormal findings 08/30/2019   History of CVA (cerebrovascular accident) 12/09/2018   Anxiety 12/09/2018   Obesity (BMI 30.0-34.9) 12/09/2018   Common migraine with intractable migraine 11/08/2015   PFO (patent foramen ovale) 10/08/2015   HLD (hyperlipidemia) 10/08/2015   Vitamin D deficiency 10/08/2015    Past Surgical History:  Procedure Laterality Date   CESAREAN SECTION     PATENT FORAMEN OVALE CLOSURE  09/25/2015   TUBAL LIGATION      Allergies Triptans and Morphine and related  Family History   Problem Relation Age of Onset   Hypertension Mother    Heart disease Maternal Uncle        murmur   Heart disease Maternal Grandmother    COPD Maternal Grandfather    Cancer Maternal Grandfather        lung   Heart disease Maternal Aunt        murmur   Migraines Neg Hx     Social History Social History   Tobacco Use   Smoking status: Never   Smokeless tobacco: Never  Substance Use Topics   Alcohol use: No   Drug use: No    Review of Systems  Constitutional: No fever/chills Eyes: No visual changes. ENT: No sore throat. Cardiovascular: Denies chest pain. Respiratory: Denies shortness of breath. Gastrointestinal: No abdominal pain.  No nausea, no vomiting.  No diarrhea.  No constipation. Genitourinary: Negative for dysuria. Musculoskeletal: Negative for back pain. Skin: Negative for rash. Neurological: Positive speech disturbance and weakness.   10-point ROS otherwise negative.  ____________________________________________   PHYSICAL EXAM:  VITAL SIGNS: ED Triage Vitals [09/03/20 1957]  Enc Vitals Group     BP 133/85     Pulse Rate 84     Resp 12     Temp      Temp src      SpO2 100 %    Constitutional: Alert and oriented. Well appearing and in no acute distress. Eyes: Conjunctivae are normal. PERRL. EOMI. Head: Atraumatic.  Nose: No congestion/rhinnorhea. Mouth/Throat: Mucous membranes are moist.   Neck: No stridor.   Cardiovascular: Normal rate, regular rhythm. Good peripheral circulation. Grossly normal heart sounds.   Respiratory: Normal respiratory effort.  No retractions. Lungs CTAB. Gastrointestinal: Soft and nontender. No distention.  Musculoskeletal: No lower extremity tenderness nor edema. No gross deformities of extremities. Neurologic: Patient with a hesitant, stuttering type speech on arrival with some slurring of words.  She seems globally weak on exam 4+/5 in the upper and lower extremities. No gaze deviation or LVO concern.  Skin:  Skin  is warm, dry and intact. No rash noted.   ____________________________________________   LABS (all labs ordered are listed, but only abnormal results are displayed)  Labs Reviewed  URINALYSIS, ROUTINE W REFLEX MICROSCOPIC - Abnormal; Notable for the following components:      Result Value   Color, Urine RED (*)    APPearance HAZY (*)    Glucose, UA   (*)    Value: TEST NOT REPORTED DUE TO COLOR INTERFERENCE OF URINE PIGMENT   Hgb urine dipstick   (*)    Value: TEST NOT REPORTED DUE TO COLOR INTERFERENCE OF URINE PIGMENT   Bilirubin Urine   (*)    Value: TEST NOT REPORTED DUE TO COLOR INTERFERENCE OF URINE PIGMENT   Ketones, ur   (*)    Value: TEST NOT REPORTED DUE TO COLOR INTERFERENCE OF URINE PIGMENT   Protein, ur   (*)    Value: TEST NOT REPORTED DUE TO COLOR INTERFERENCE OF URINE PIGMENT   Nitrite   (*)    Value: TEST NOT REPORTED DUE TO COLOR INTERFERENCE OF URINE PIGMENT   Leukocytes,Ua   (*)    Value: TEST NOT REPORTED DUE TO COLOR INTERFERENCE OF URINE PIGMENT   All other components within normal limits  URINALYSIS, MICROSCOPIC (REFLEX) - Abnormal; Notable for the following components:   Bacteria, UA RARE (*)    All other components within normal limits  RESP PANEL BY RT-PCR (FLU A&B, COVID) ARPGX2  PROTIME-INR  APTT  CBC  DIFFERENTIAL  COMPREHENSIVE METABOLIC PANEL  RAPID URINE DRUG SCREEN, HOSP PERFORMED  I-STAT CHEM 8, ED  CBG MONITORING, ED  POC URINE PREG, ED   ____________________________________________  EKG   EKG Interpretation  Date/Time:  Monday September 03 2020 19:56:35 EDT Ventricular Rate:  80 PR Interval:  169 QRS Duration: 100 QT Interval:  379 QTC Calculation: 438 R Axis:   53 Text Interpretation: Sinus rhythm RSR' in V1 or V2, right VCD or RVH Baseline wander in lead(s) V6 Confirmed by Alona Bene [phone]) on 09/03/2020 9:47:50 PM        ____________________________________________  RADIOLOGY  CT HEAD CODE STROKE WO  CONTRAST  Result Date: 09/03/2020 CLINICAL DATA:  Code stroke. Neuro deficit, acute, stroke suspected; abnormal speech EXAM: CT HEAD WITHOUT CONTRAST TECHNIQUE: Contiguous axial images were obtained from the base of the skull through the vertex without intravenous contrast. COMPARISON:  09/02/2020 FINDINGS: Brain: There is no acute intracranial hemorrhage, mass effect, or edema. Gray-white differentiation is preserved. Ventricles and sulci are normal in size and configuration. No extra-axial collection. Vascular: No hyperdense vessel. Skull: No new abnormality. Sinuses/Orbits: No new abnormality. Other: Mastoid air cells are clear. ASPECTS (Alberta Stroke Program Early CT Score) - Ganglionic level infarction (caudate, lentiform nuclei, internal capsule, insula, M1-M3 cortex): 7 - Supraganglionic infarction (M4-M6 cortex): 3 Total score (0-10 with 10 being normal): 10 IMPRESSION: There is no acute intracranial hemorrhage or evidence of acute infarction.  ASPECT score is 10. These results were called by telephone at the time of interpretation on 09/03/2020 at 7:50 pm to provider Camya Haydon , who verbally acknowledged these results. Electronically Signed   By: Guadlupe Spanish M.D.   On: 09/03/2020 19:51    ____________________________________________   PROCEDURES  Procedure(s) performed:   Procedures  CRITICAL CARE Performed by: Maia Plan Total critical care time: 35 minutes Critical care time was exclusive of separately billable procedures and treating other patients. Critical care was necessary to treat or prevent imminent or life-threatening deterioration. Critical care was time spent personally by me on the following activities: development of treatment plan with patient and/or surrogate as well as nursing, discussions with consultants, evaluation of patient's response to treatment, examination of patient, obtaining history from patient or surrogate, ordering and performing treatments and  interventions, ordering and review of laboratory studies, ordering and review of radiographic studies, pulse oximetry and re-evaluation of patient's condition.  Alona Bene, MD Emergency Medicine  ____________________________________________   INITIAL IMPRESSION / ASSESSMENT AND PLAN / ED COURSE  Pertinent labs & imaging results that were available during my care of the patient were reviewed by me and considered in my medical decision making (see chart for details).   Patient presents emergency department with return of strokelike symptoms.  Differential is broad especially after given a chance to review some of her prior ED visits.  CT imaging of the head shows no acute hemorrhage or other process.  In speaking with our neurology team the patient is not a tPA or intravascular candidate.  The patient's mom is able to provide some additional history when she arrives described more episodic type symptoms.  Seizures have been considered in the past with the MRI abnormality possibly being a focus for seizure.  Neurology recommends MRI with and without contrast which was ordered along with repeat EEG and restarting ASA. Family's request to [facility] transfer/admit.    Discussed patient's case with [facility] to request admission. Patient and family (if present) updated with plan. Care transferred to [facility] service.  I reviewed all nursing notes, vitals, pertinent old records, EKGs, labs, imaging (as available).  ____________________________________________  FINAL CLINICAL IMPRESSION(S) / ED DIAGNOSES  Final diagnoses:  Stroke-like symptoms    MEDICATIONS GIVEN DURING THIS VISIT:  Medications  LORazepam (ATIVAN) injection 1 mg (has no administration in time range)  acetaminophen (TYLENOL) tablet 650 mg (650 mg Oral Given 09/03/20 2229)  sodium chloride flush (NS) 0.9 % injection 3 mL (3 mLs Intravenous Given 09/03/20 2006)     Note:  This document was prepared using Dragon voice recognition software and  may include unintentional dictation errors.  Alona Bene, MD, [facility] Emergency [facility]    Shahram Alexopoulos, Arlyss Repress, MD 09/03/20 [phone]

## 2020-09-03 NOTE — ED Notes (Signed)
Pt arrives via ems for cc of slurred speech. Pt was here 2 weeks ago for cva. Was seen last night at [facility] for generalized weakness and was d/c. Cc started tonight at 6:40pm

## 2020-09-03 NOTE — ED Triage Notes (Signed)
Stroke symptoms started at 1840.

## 2020-09-04 ENCOUNTER — Encounter (HOSPITAL_COMMUNITY): Payer: Self-pay | Admitting: Family Medicine

## 2020-09-04 ENCOUNTER — Observation Stay (HOSPITAL_COMMUNITY): Payer: Medicaid Other

## 2020-09-04 ENCOUNTER — Ambulatory Visit: Payer: Medicaid Other | Admitting: Nurse Practitioner

## 2020-09-04 DIAGNOSIS — R262 Difficulty in walking, not elsewhere classified: Secondary | ICD-10-CM | POA: Diagnosis not present

## 2020-09-04 DIAGNOSIS — R251 Tremor, unspecified: Secondary | ICD-10-CM | POA: Diagnosis not present

## 2020-09-04 DIAGNOSIS — R4701 Aphasia: Secondary | ICD-10-CM | POA: Diagnosis not present

## 2020-09-04 DIAGNOSIS — Z3202 Encounter for pregnancy test, result negative: Secondary | ICD-10-CM | POA: Diagnosis not present

## 2020-09-04 DIAGNOSIS — F419 Anxiety disorder, unspecified: Secondary | ICD-10-CM | POA: Diagnosis not present

## 2020-09-04 DIAGNOSIS — R200 Anesthesia of skin: Secondary | ICD-10-CM | POA: Diagnosis not present

## 2020-09-04 DIAGNOSIS — R4182 Altered mental status, unspecified: Secondary | ICD-10-CM | POA: Diagnosis not present

## 2020-09-04 DIAGNOSIS — R6889 Other general symptoms and signs: Secondary | ICD-10-CM | POA: Diagnosis not present

## 2020-09-04 DIAGNOSIS — R29810 Facial weakness: Secondary | ICD-10-CM | POA: Diagnosis not present

## 2020-09-04 DIAGNOSIS — I639 Cerebral infarction, unspecified: Secondary | ICD-10-CM | POA: Diagnosis not present

## 2020-09-04 DIAGNOSIS — R531 Weakness: Secondary | ICD-10-CM | POA: Diagnosis not present

## 2020-09-04 DIAGNOSIS — G3184 Mild cognitive impairment, so stated: Secondary | ICD-10-CM | POA: Diagnosis not present

## 2020-09-04 DIAGNOSIS — R202 Paresthesia of skin: Secondary | ICD-10-CM | POA: Diagnosis not present

## 2020-09-04 DIAGNOSIS — R519 Headache, unspecified: Secondary | ICD-10-CM | POA: Diagnosis not present

## 2020-09-04 LAB — BASIC METABOLIC PANEL
Anion gap: 4 — ABNORMAL LOW (ref 5–15)
BUN: 14 mg/dL (ref 6–20)
CO2: 26 mmol/L (ref 22–32)
Calcium: 8.6 mg/dL — ABNORMAL LOW (ref 8.9–10.3)
Chloride: 105 mmol/L (ref 98–111)
Creatinine, Ser: 0.79 mg/dL (ref 0.44–1.00)
GFR, Estimated: >60 mL/min (ref >60)
Glucose, Bld: 96 mg/dL (ref 70–99)
Potassium: 3.5 mmol/L (ref 3.5–5.1)
Sodium: 135 mmol/L (ref 135–145)

## 2020-09-04 LAB — CBC
HCT: 38.6 % (ref 36.0–46.0)
Hemoglobin: 12.5 g/dL (ref 12.0–15.0)
MCH: 32 pg (ref 26.0–34.0)
MCHC: 32.4 g/dL (ref 30.0–36.0)
MCV: 98.7 fL (ref 80.0–100.0)
Platelets: 206 10*3/uL (ref 150–400)
RBC: 3.91 MIL/uL (ref 3.87–5.11)
RDW: 13.2 % (ref 11.5–15.5)
WBC: 4.6 10*3/uL (ref 4.0–10.5)
nRBC: 0 % (ref 0.0–0.2)

## 2020-09-04 IMAGING — MR MR HEAD W/O CM
9 series · 48 of 48 positions shown · non-contrast
Comparison: CT head 1 day prior, MR head [DATE]

CLINICAL DATA: Mental status change, unknown cause

EXAM:
MRI HEAD WITHOUT CONTRAST
TECHNIQUE: Multiplanar, multiecho pulse sequences of the brain and surrounding
structures were obtained without intravenous contrast.

[Series 5: DWI · axial · 4.0mm · 0.88mm/px · z∈[-67,+67]mm · 7 of 36 slices shown (1 of 6)]
[im 1/36]
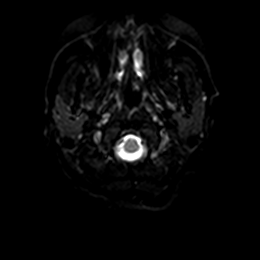
[im 6/36]
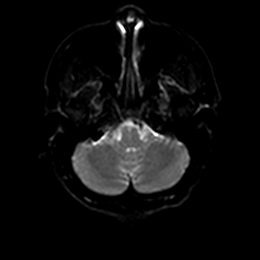
[im 12/36]
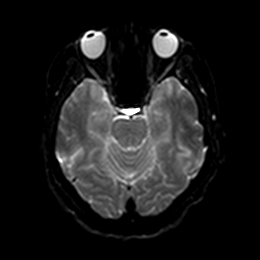
[im 18/36]
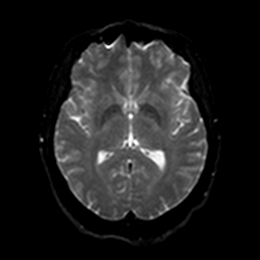
[im 24/36]
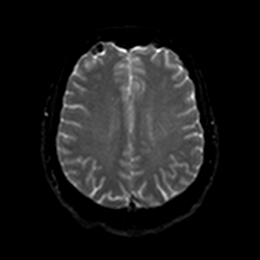
[im 30/36]
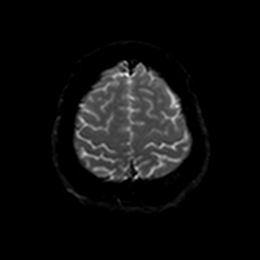
[im 36/36]
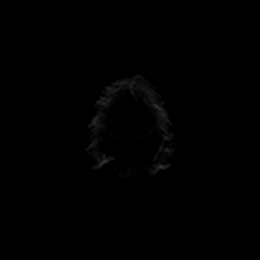

[Series 5: DWI · axial · 4.0mm · 0.88mm/px · z∈[-67,+67]mm · 6 of 36 slices shown (2 of 6)]
[im 1/36]
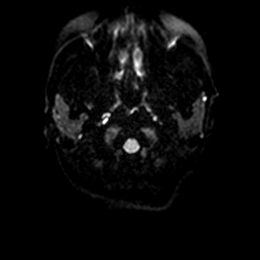
[im 8/36]
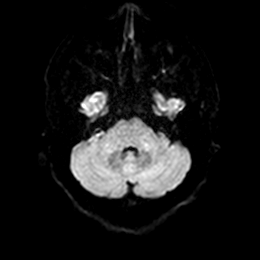
[im 15/36]
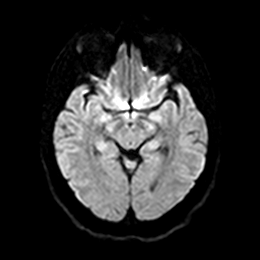
[im 22/36]
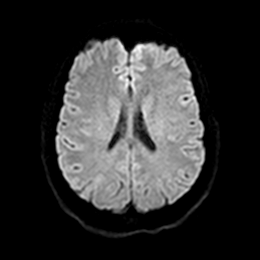
[im 29/36]
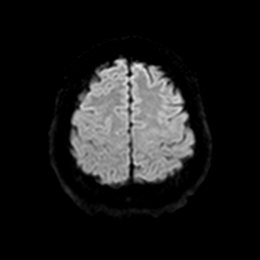
[im 36/36]
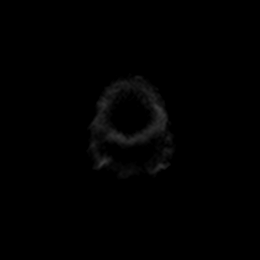

[Series 6: DWI · axial · 4.0mm · 0.88mm/px · z∈[-67,+67]mm · 6 of 36 slices shown (3 of 6)]
[im 1/36]
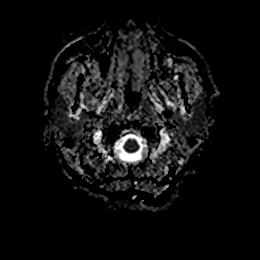
[im 8/36]
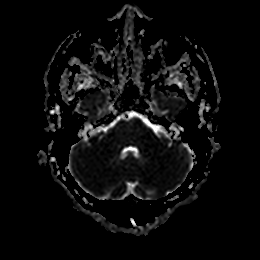
[im 15/36]
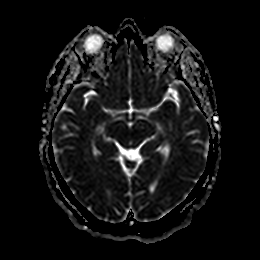
[im 22/36]
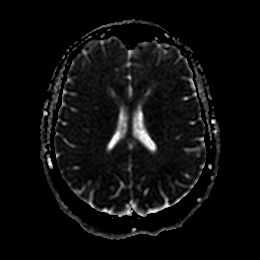
[im 29/36]
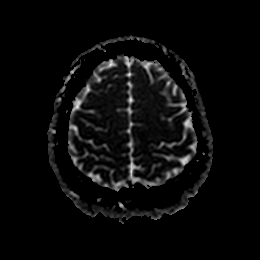
[im 36/36]
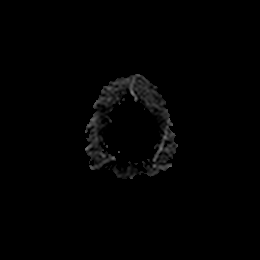

[Series 7: DWI · coronal · 5.0mm · 0.88mm/px · 5 of 28 slices shown (4 of 6)]
[im 1/28]
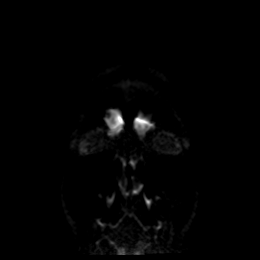
[im 7/28]
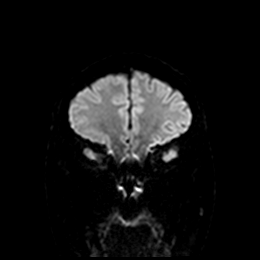
[im 14/28]
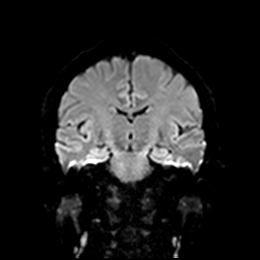
[im 21/28]
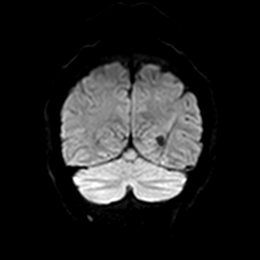
[im 28/28]
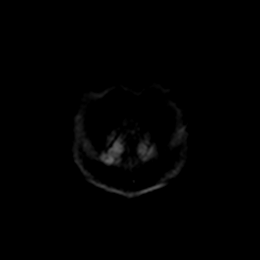

[Series 7: DWI · coronal · 5.0mm · 0.88mm/px · 5 of 28 slices shown (5 of 6)]
[im 1/28]
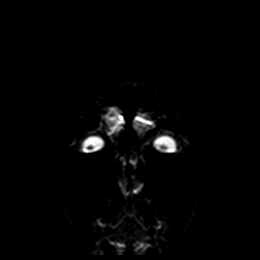
[im 7/28]
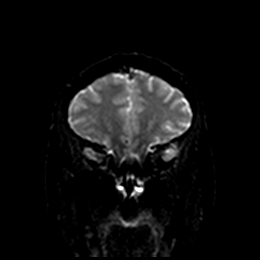
[im 14/28]
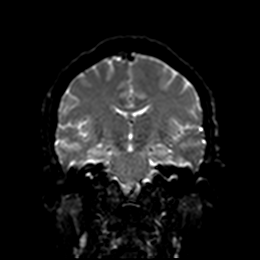
[im 21/28]
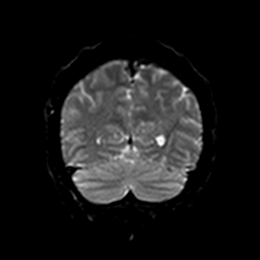
[im 28/28]
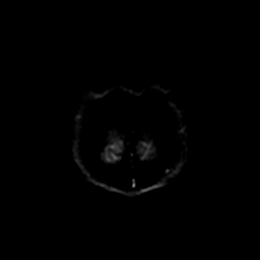

[Series 8: DWI · coronal · 5.0mm · 0.88mm/px · 5 of 28 slices shown (6 of 6)]
[im 1/28]
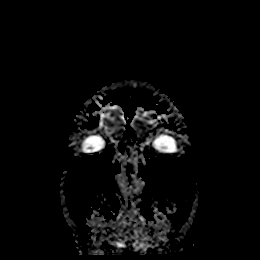
[im 7/28]
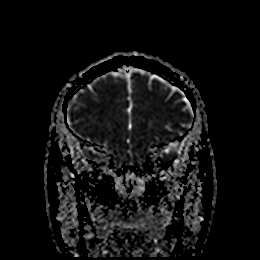
[im 14/28]
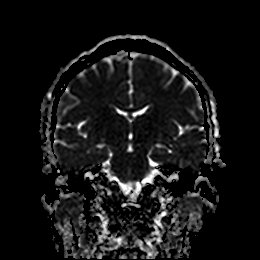
[im 21/28]
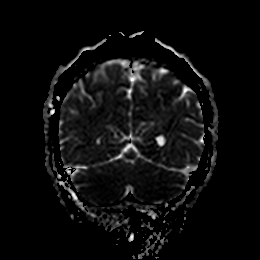
[im 28/28]
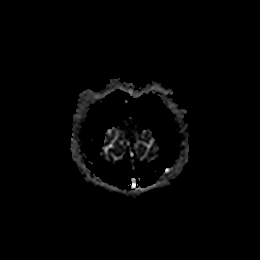

[Series 9: T1 · sagittal · 5.0mm · 0.94mm/px · 4 of 25 slices shown]
[im 1/25]
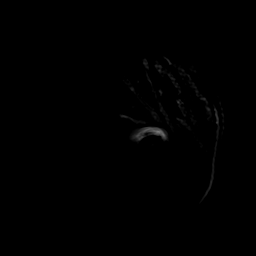
[im 9/25]
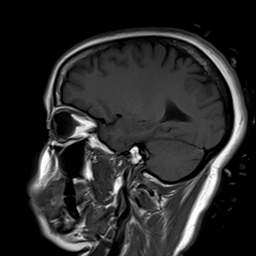
[im 17/25]
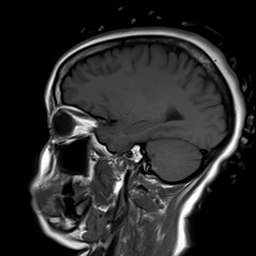
[im 25/25]
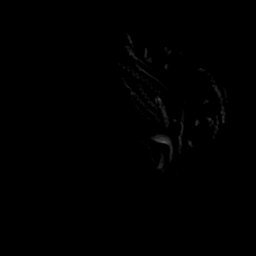

[Series 10: T2 · axial · 5.0mm · 0.72mm/px · z∈[-59,+70]mm · 4 of 20 slices shown]
[im 1/20]
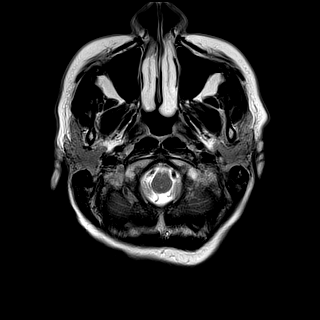
[im 7/20]
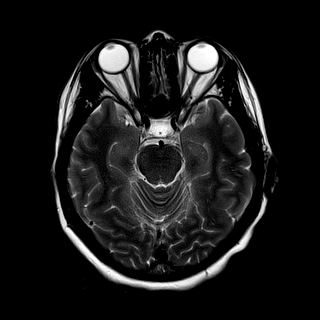
[im 13/20]
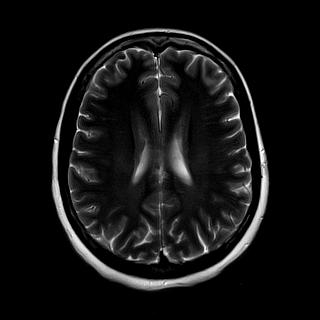
[im 20/20]
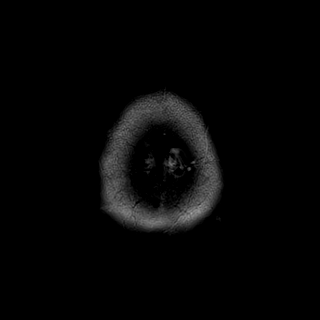

[Series 11: FLAIR · axial · 4.0mm · 0.43mm/px · z∈[-54,+66]mm · 6 of 32 slices shown]
[im 1/32]
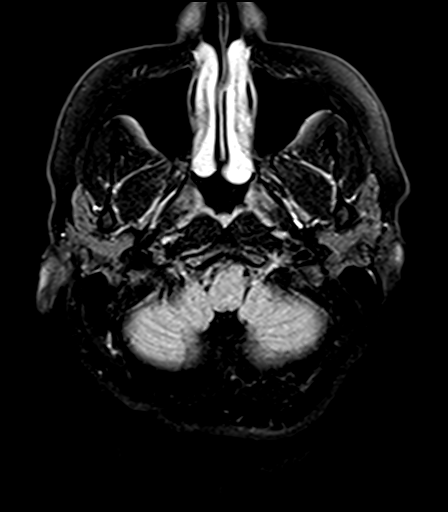
[im 7/32]
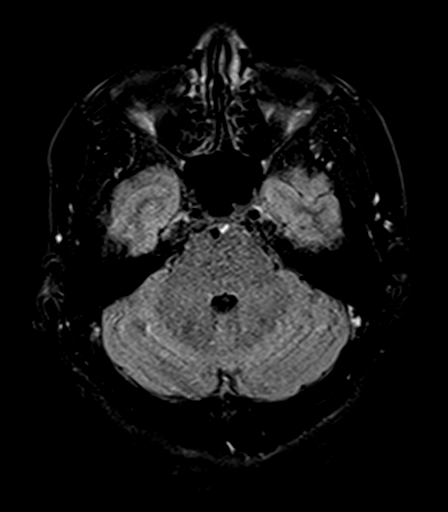
[im 13/32]
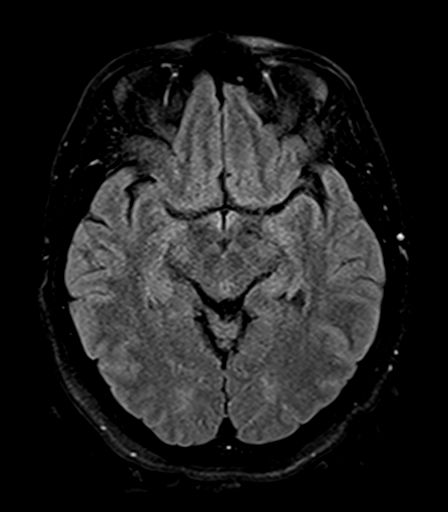
[im 19/32]
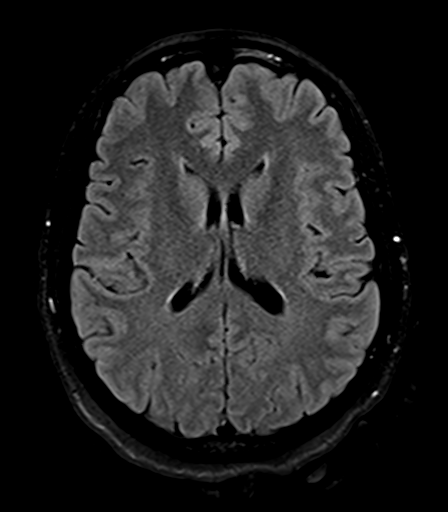
[im 25/32]
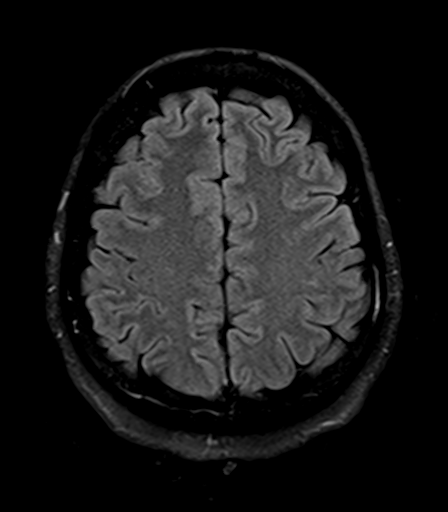
[im 32/32]
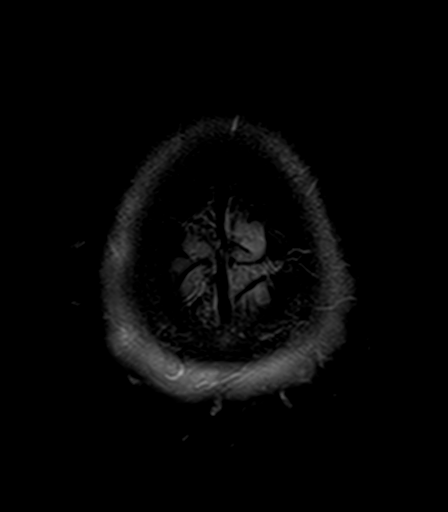

[48 of 48 positions shown; findings below may reference images not displayed]

FINDINGS: The patient was unable to complete the full exam. SWI and axial T1
images were not obtained. Within this confines:

Brain: There is no evidence of acute intracranial hemorrhage,
extra-axial fluid collection, or infarct. There is no parenchymal
signal abnormality. The ventricles are not enlarged. There is no
midline shift. No mass lesion isn't

Vascular: Normal flow voids.

Skull and upper cervical spine: Normal marrow signal.

Sinuses/Orbits: The imaged paranasal sinuses are clear. The globes
and orbits are unremarkable.

Other: None.
IMPRESSION: Incomplete study due to patient claustrophobia as above. SWI and
axial T1 images were not obtained. Within this confine:

No acute intracranial pathology.

## 2020-09-04 MED ORDER — ASPIRIN 81 MG PO CHEW
81.0000 mg | CHEWABLE_TABLET | Freq: Every day | ORAL | Status: DC
  Administered 2020-09-04: 81 mg via ORAL
  Filled 2020-09-04: qty 1

## 2020-09-04 MED ORDER — ONDANSETRON HCL 4 MG PO TABS: 4.0000 mg | ORAL_TABLET | Freq: Four times a day (QID) | ORAL | Status: DC | PRN

## 2020-09-04 MED ORDER — ENOXAPARIN SODIUM 40 MG/0.4ML IJ SOSY
40.0000 mg | PREFILLED_SYRINGE | INTRAMUSCULAR | Status: DC
  Filled 2020-09-04: qty 0.4

## 2020-09-04 MED ORDER — ONDANSETRON HCL 4 MG/2ML IJ SOLN: 4.0000 mg | Freq: Four times a day (QID) | INTRAMUSCULAR | Status: DC | PRN

## 2020-09-04 MED ORDER — ATORVASTATIN CALCIUM 40 MG PO TABS
40.0000 mg | ORAL_TABLET | Freq: Every day | ORAL | Status: DC
  Administered 2020-09-04: 40 mg via ORAL
  Filled 2020-09-04: qty 1

## 2020-09-04 MED ORDER — SENNOSIDES-DOCUSATE SODIUM 8.6-50 MG PO TABS: 1.0000 | ORAL_TABLET | Freq: Every evening | ORAL | Status: DC | PRN

## 2020-09-04 NOTE — Discharge Summary (Signed)
-  Patient left AMA -Patient, her brother and mother want to go to [facility] - -Please see full note dictated on same date of service  Shon Hale, MD

## 2020-09-04 NOTE — H&P (Signed)
History and Physical    Marissa [name] ZOX:[id number] DOB: 12-17-1980 DOA: 09/03/2020  PCP: Heather RobertsGray, Joseph M, NP   Patient coming from: Home   Chief Complaint: Rapid blinking, speech difficulty, leg weakness   HPI: Marissa [name] is a 40 y.o. female with medical history significant for possible small stroke on MRI last month, now presenting to the emergency department for evaluation of speech difficulty and rapid blinking.  Patient reports that she has had bilateral leg weakness and unable to ambulate for 2 weeks (though noted to have "normal gait" documented at PCP visit on 08/30/2020), had some recent upper respiratory symptoms with negative COVID test, and was then watching TV this evening when she began to have rapid blinking bilaterally.  She reports trying to alert her husband to these symptoms, but was having difficulty with her speech.  Speech eventually normalized while in the emergency department, she reportedly had a recurrence in her presenting complaints, but these then resolved again.  She denies any back pain or loss of continence but reports bilateral lower extremity weakness with inability to ambulate for the past 2 weeks.  She reports experiencing some bilateral lower extremity paresthesias, right greater than left yesterday but not today.  She was recently prescribed anxiety medications and had a behavioral health visit 1 week ago, but expressed no interest in taking any medications for anxiety.  ED Course: Upon arrival to the ED, patient is found to be saturating well on room air with normal heart rate, normal respirations, and stable blood pressure.  EKG with sinus rhythm.  Head CT with no acute hemorrhage or infarction.  Chemistry panel and CBC are unremarkable.  Teleneurology was consulted, evaluated the patient, and recommended medical admission, continue aspirin, and check MRI brain with and without, as well as EEG.    Review of Systems:  All other systems reviewed and  apart from HPI, are negative.  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) 10/08/2015   R  frontal   Common migraine with intractable migraine 11/08/2015   Encounter for gynecological examination with Papanicolaou smear of cervix 12/30/2018   Hyperlipidemia    Stroke (HCC)    Stroke (HCC)    Vitamin D deficiency     Past Surgical History:  Procedure Laterality Date   CESAREAN SECTION     PATENT FORAMEN OVALE CLOSURE  09/25/2015   TUBAL LIGATION      Social History:   reports that she has never smoked. She has never used smokeless tobacco. She reports that she does not drink alcohol and does not use drugs.  Allergies  Allergen Reactions   Triptans     Cannot use triptans for migraine per neurology due to stroke history   Morphine And Related Hives    Feels like skin is burning     Family History  Problem Relation Age of Onset   Hypertension Mother    Heart disease Maternal Uncle        murmur   Heart disease Maternal Grandmother    COPD Maternal Grandfather    Cancer Maternal Grandfather        lung   Heart disease Maternal Aunt        murmur   Migraines Neg Hx      Prior to Admission medications   Medication Sig Start Date End Date Taking? Authorizing Provider  acetaminophen (TYLENOL) 500 MG  tablet Take 1,000 mg by mouth every 6 (six) hours as needed.   Yes [provider]  aspirin 81 MG chewable tablet Chew 81 mg by mouth once as needed.   Yes [provider]  atorvastatin (LIPITOR) 40 MG tablet Take 1 tablet (40 mg total) by mouth daily. 08/14/20 09/13/20 Yes Shahmehdi, Gemma Payor, MD  cetirizine (ZYRTEC) 10 MG tablet Take 10 mg by mouth daily.   Yes [provider]  promethazine-dextromethorphan (PROMETHAZINE-DM) 6.25-15 MG/5ML syrup Take 5 mLs by mouth 4 (four) times daily as needed for cough. 09/02/20  Yes Bing Neighbors, FNP  benzonatate (TESSALON)  200 MG capsule 1 capsule Patient not taking: No sig reported 02/18/18   [provider]  brompheniramine-pseudoephedrine-DM 30-2-10 MG/5ML syrup Take by mouth. Patient not taking: No sig reported 09/10/19   [provider]    Physical Exam: Vitals:   09/03/20 2303 09/03/20 2330 09/04/20 0000 09/04/20 0030  BP: 118/72 105/70 104/74 101/63  Pulse: 70 68 64 70  Resp: 10 12 12 12   SpO2: 100% 97% 98% 100%    Constitutional: NAD, calm  Eyes: PERTLA, lids and conjunctivae normal ENMT: Mucous membranes are moist. Posterior pharynx clear of any exudate or lesions.   Neck: supple, no masses  Respiratory: no wheezing, no crackles. No accessory muscle use.  Cardiovascular: S1 & S2 heard, regular rate and rhythm. No extremity edema.   Abdomen: No distension, no tenderness, soft. Bowel sounds active.  Musculoskeletal: no clubbing / cyanosis. No joint deformity upper and lower extremities.   Skin: no significant rashes, lesions, ulcers. Warm, dry, well-perfused. Neurologic: CN 2-12 grossly intact. Sensation intact, DTR normal. Strength 5/5 throughout bilateral UEs. Strength 2/5 involving bilateral LEs.  Psychiatric: Alert and oriented to person, place, and situation. Calm and cooperative.    Labs and Imaging on Admission: I have personally reviewed following labs and imaging studies  CBC: Recent Labs  Lab 08/30/20 0901 09/03/20 1954 09/03/20 1956  WBC 3.5  --  4.1  NEUTROABS 1.6  --  1.7  HGB 11.9 12.6 13.3  HCT 36.1 37.0 40.8  MCV 94  --  99.5  PLT 194  --  197   Basic Metabolic Panel: Recent Labs  Lab 08/30/20 0901 09/03/20 1954 09/03/20 1956  NA 138 140 136  K 4.0 3.5 3.5  CL 103 103 104  CO2 22  --  25  GLUCOSE 84 86 80  BUN 11 13 14   CREATININE 0.85 0.90 0.79  CALCIUM 9.2  --  9.0   GFR: Estimated Creatinine Clearance: 91.8 mL/min (by C-G formula based on SCr of 0.79 mg/dL). Liver Function Tests: Recent Labs  Lab 08/30/20 0901 09/03/20 1956  AST  21 21  ALT 26 24  ALKPHOS 51 54  BILITOT 0.7 0.7  PROT 6.6 7.6  ALBUMIN 4.1 4.1   No results for input(s): LIPASE, AMYLASE in the last 168 hours. No results for input(s): AMMONIA in the last 168 hours. Coagulation Profile: Recent Labs  Lab 09/03/20 1956  INR 1.0   Cardiac Enzymes: No results for input(s): CKTOTAL, CKMB, CKMBINDEX, TROPONINI in the last 168 hours. BNP (last 3 results) No results for input(s): PROBNP in the last 8760 hours. HbA1C: No results for input(s): HGBA1C in the last 72 hours. CBG: Recent Labs  Lab 09/03/20 1955  GLUCAP 98   Lipid Profile: No results for input(s): CHOL, HDL, LDLCALC, TRIG, CHOLHDL, LDLDIRECT in the last 72 hours. Thyroid Function Tests: No results for input(s): TSH, T4TOTAL,  FREET4, T3FREE, THYROIDAB in the last 72 hours. Anemia Panel: No results for input(s): VITAMINB12, FOLATE, FERRITIN, TIBC, IRON, RETICCTPCT in the last 72 hours. Urine analysis:    Component Value Date/Time   COLORURINE RED (A) 09/03/2020 2039   APPEARANCEUR HAZY (A) 09/03/2020 2039   LABSPEC  09/03/2020 2039    TEST NOT REPORTED DUE TO COLOR INTERFERENCE OF URINE PIGMENT   PHURINE  09/03/2020 2039    TEST NOT REPORTED DUE TO COLOR INTERFERENCE OF URINE PIGMENT   GLUCOSEU (A) 09/03/2020 2039    TEST NOT REPORTED DUE TO COLOR INTERFERENCE OF URINE PIGMENT   HGBUR (A) 09/03/2020 2039    TEST NOT REPORTED DUE TO COLOR INTERFERENCE OF URINE PIGMENT   BILIRUBINUR (A) 09/03/2020 2039    TEST NOT REPORTED DUE TO COLOR INTERFERENCE OF URINE PIGMENT   KETONESUR (A) 09/03/2020 2039    TEST NOT REPORTED DUE TO COLOR INTERFERENCE OF URINE PIGMENT   PROTEINUR (A) 09/03/2020 2039    TEST NOT REPORTED DUE TO COLOR INTERFERENCE OF URINE PIGMENT   UROBILINOGEN 0.2 06/23/2010 1457   NITRITE (A) 09/03/2020 2039    TEST NOT REPORTED DUE TO COLOR INTERFERENCE OF URINE PIGMENT   LEUKOCYTESUR (A) 09/03/2020 2039    TEST NOT REPORTED DUE TO COLOR INTERFERENCE OF URINE PIGMENT    Sepsis Labs: @LABRCNTIP (procalcitonin:4,lacticidven:4) ) Recent Results (from the past 240 hour(s))  Covid-19, Flu A+B ([facility])     Status: None   Collection Time: 09/02/20  8:46 AM   Specimen: Nasopharyngeal   Naso  Result Value Ref Range Status   SARS-CoV-2, NAA Not Detected Not Detected Final   Influenza A, NAA Not Detected Not Detected Final   Influenza B, NAA Not Detected Not Detected Final   Test Information: Comment  Final    Comment: This nucleic acid amplification test was developed and its performance characteristics determined by 09/04/20. Nucleic acid amplification tests include RT-PCR and TMA. This test has not been FDA cleared or approved. This test has been authorized by FDA under an Emergency Use Authorization (EUA). This test is only authorized for the duration of time the declaration that circumstances exist justifying the authorization of the emergency use of in vitro diagnostic tests for detection of SARS-CoV-2 virus and/or diagnosis of COVID-19 infection under section 564(b)(1) of the Act, 21 U.S.C. [employer]) (1), unless the authorization is terminated or revoked sooner. When diagnostic testing is negative, the possibility of a false negative result should be considered in the context of a patient's recent exposures and the presence of clinical signs and symptoms consistent with COVID-19. An individual without symptoms of COVID-19 and who is not shedding SARS-CoV-2 virus wo uld expect to have a negative (not detected) result in this assay.   Resp Panel by RT-PCR (Flu A&B, Covid) Nasopharyngeal Swab     Status: None   Collection Time: 09/03/20 10:00 PM   Specimen: Nasopharyngeal Swab; Nasopharyngeal(NP) swabs in vial transport medium  Result Value Ref Range Status   SARS Coronavirus 2 by RT PCR NEGATIVE NEGATIVE Final    Comment: (NOTE) SARS-CoV-2 target nucleic acids are NOT DETECTED.  The SARS-CoV-2 RNA is generally detectable in upper  respiratory specimens during the acute phase of infection. The lowest concentration of SARS-CoV-2 viral copies this assay can detect is 138 copies/mL. A negative result does not preclude SARS-Cov-2 infection and should not be used as the sole basis for treatment or other patient management decisions. A negative result may occur with  improper specimen collection/handling,  submission of specimen other than nasopharyngeal swab, presence of viral mutation(s) within the areas targeted by this assay, and inadequate number of viral copies(<138 copies/mL). A negative result must be combined with clinical observations, patient history, and epidemiological information. The expected result is Negative.  Fact Sheet for Patients:  BloggerCourse.com  Fact Sheet for Healthcare Providers:  SeriousBroker.it  This test is no t yet approved or cleared by the [place] FDA and  has been authorized for detection and/or diagnosis of SARS-CoV-2 by FDA under an Emergency Use Authorization (EUA). This EUA will remain  in effect (meaning this test can be used) for the duration of the COVID-19 declaration under Section 564(b)(1) of the Act, 21 U.S.C.section 360bbb-3(b)(1), unless the authorization is terminated  or revoked sooner.       Influenza A by PCR NEGATIVE NEGATIVE Final   Influenza B by PCR NEGATIVE NEGATIVE Final    Comment: (NOTE) The Xpert Xpress SARS-CoV-2/FLU/RSV plus assay is intended as an aid in the diagnosis of influenza from Nasopharyngeal swab specimens and should not be used as a sole basis for treatment. Nasal washings and aspirates are unacceptable for Xpert Xpress SARS-CoV-2/FLU/RSV testing.  Fact Sheet for Patients: BloggerCourse.com  Fact Sheet for Healthcare Providers: SeriousBroker.it  This test is not yet approved or cleared by the [place] FDA and has been  authorized for detection and/or diagnosis of SARS-CoV-2 by FDA under an Emergency Use Authorization (EUA). This EUA will remain in effect (meaning this test can be used) for the duration of the COVID-19 declaration under Section 564(b)(1) of the Act, 21 U.S.C. section 360bbb-3(b)(1), unless the authorization is terminated or revoked.  Performed at [facility], [address] [place], [place] [address]      Radiological Exams on Admission: CT HEAD CODE STROKE WO CONTRAST  Result Date: 09/03/2020 CLINICAL DATA:  Code stroke. Neuro deficit, acute, stroke suspected; abnormal speech EXAM: CT HEAD WITHOUT CONTRAST TECHNIQUE: Contiguous axial images were obtained from the base of the skull through the vertex without intravenous contrast. COMPARISON:  09/02/2020 FINDINGS: Brain: There is no acute intracranial hemorrhage, mass effect, or edema. Gray-white differentiation is preserved. Ventricles and sulci are normal in size and configuration. No extra-axial collection. Vascular: No hyperdense vessel. Skull: No new abnormality. Sinuses/Orbits: No new abnormality. Other: Mastoid air cells are clear. ASPECTS ([place] Stroke Program Early CT Score) - Ganglionic level infarction (caudate, lentiform nuclei, internal capsule, insula, M1-M3 cortex): 7 - Supraganglionic infarction (M4-M6 cortex): 3 Total score (0-10 with 10 being normal): 10 IMPRESSION: There is no acute intracranial hemorrhage or evidence of acute infarction. ASPECT score is 10. These results were called by telephone at the time of interpretation on 09/03/2020 at 7:50 pm to provider JOSHUA LONG , who verbally acknowledged these results. Electronically Signed   By: Guadlupe Spanish M.D.   On: 09/03/2020 19:51    EKG: Independently reviewed. Sinus rhythm.   Assessment/Plan   1. Transient speech disturbance; rapid blinking; bilateral leg weakness  - Presents with acute-onset of rapid blinking bilaterally and difficulty speaking this evening that  resolved, recurred, and resolved again; she also notes 2 weeks of b/l LE weakness  - No acute findings on head CT  - Appreciate neurology evaluation, recommended continuing aspirin and admission for EEG and MRI with and without contrast    2. Hx of possible CVA  - Continue ASA and statin, MRI pending as above    3. Anxiety  - Patient prescribed as-needed medications and follow-up with behavioral health after  recent admission but does not want to take any anxiety medications     DVT prophylaxis: Lovenox  Code Status: Full  Level of Care: Level of care: Telemetry Medical Family Communication: Mother updated at bedside  Disposition Plan:  Patient is from: Home  Anticipated d/c is to: Home  Anticipated d/c date is: 8/16 or 09/05/20 Patient currently: Pending MRI and EEG  Consults called: Neurology consulted by ED  Admission status: Observation     Briscoe Deutscher, MD [facility]  09/04/2020, 12:58 AM

## 2020-09-04 NOTE — Progress Notes (Signed)
Patient Demographics:    Farhana Fellows, is a 40 y.o. female, DOB - 01/04/1981, TDD:[id number]  Admit date - 09/03/2020   Admitting Physician Briscoe Deutscher, MD  Outpatient Primary MD for the patient is Heather Roberts, NP  LOS - 0   Chief Complaint  Patient presents with   Code Stroke        Subjective:    Rainah Kirshner today has no fevers, no emesis,  No chest pain,   - Transient speech difficulty and rapid blinking, resolved. complains of b/l leg weakness - patient's mother at bedside and patient's brother who is on the video phone call in the room    Assessment  & Plan :    Principal Problem:   Speech disturbance Active Problems:   Spell of abnormal behavior   Abnormal MRI of head  Brief Summary:- 40 y.o. 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 ovale,  possible small stroke on MRI (MRA Head and MRI Brain on 08/13/20 showed- 3 mm area of restricted diffusion within the medial right temporal lobe/hippocampus) --returns to the ED with concerns about paresthesia, difficulty speaking, checking movements of upper extremities   A/p- 1)Abnormal Eye Movements and speech disturbance and leg Weakness MRI Brain from 08/13/20 was incomplete (Claustrophobia) but showed 3 mm focus of restricted diffusion within the medial right temporal lobe/hippocampus, compatible with acute infarction and MRA Head from 7/25//22 showed 1-2 mm inferomedially projecting vascular protrusion arising from the distal cavernous left ICA, which may reflect an aneurysm or infundibulum. -Carotid artery Doppler from 08/13/2020 without hemodynamically significant stenosis -MRI from 09/04/2020 without acute findings--MRI was again incomplete this time again because patient had claustrophobia -CT head from 09/03/2020 without acute findings -EKG sinus rhythm without acute  findings -It appears the patient had Echo on  08/13/2020--- I am unable to pull up the echo report at this time LDL 88, T chol 162 -Continue aspirin and Lipitor -in Summary----neurology previously evaluated patient and deemed acute stroke unlikely  2)Functional Neurologic system disorder/conversion disorder --patient had TTS/behavioral health assessment on 08/14/2020 due to concerns about possible conversion disorder -Xanax as needed was prescribed at the time  3)?? Possible Sz--- EEG from 08/13/20 w/o epileptiform findings -Patient with recurrent episodes of visual disturbance including floaters and had some tremors and shaking, twitching and other abnormal movements  as well as Transient speech difficulty and rapid blinking,  complains of b/l leg weakness -Low clinical index of suspicion for seizures --I will defer to neurology team if patient needs 24 hr-continuous EEG especially since she is having frequent recurrent episodes  4)Social/Ethics--- despite extensive conversation with patient, her mother and her brother--- they are requesting face-to-face evaluation by neurologist -Dr. Gerilyn Pilgrim our local neurologist is currently not available -Patient was admitted on 09/03/2020 by my colleague with plans to transfer to [facility] for inpatient neurology evaluation- -If neurology evaluation is negative patient may need another evaluation by the psychiatric/behavioral health team -I will defer to neurology team if patient needs 24 hr-continuous EEG especially since she is having frequent recurrent episodes   Disposition/Need for in-Hospital Stay- patient unable to be discharged at this time due to --- awaiting transfer to [facility]  Status is: Inpatient  Remains inpatient appropriate because: Awaiting transfer to [facility] for further neurology evaluation  Disposition: The patient is from: Home              Anticipated d/c is to: Home              Anticipated d/c date is: 1 day               Patient currently is not medically stable to d/c. Barriers: Not Clinically Stable-   Code Status :  -  Code Status: Full Code   Family Communication:    (patient is alert, awake and coherent)  Discussed with patient's mother at bedside and patient's brother who is on the video phone call in the room  Consults  :  Neuro  DVT Prophylaxis  :   - SCDs enoxaparin (LOVENOX) injection 40 mg Start: 09/04/20 1000  Lab Results  Component Value Date   PLT 206 09/04/2020    Inpatient Medications  Scheduled Meds:  aspirin  81 mg Oral Daily   atorvastatin  40 mg Oral Daily   enoxaparin (LOVENOX) injection  40 mg Subcutaneous Q24H   Continuous Infusions: PRN Meds:.acetaminophen, LORazepam, ondansetron **OR** ondansetron (ZOFRAN) IV, senna-docusate    Anti-infectives (From admission, onward)    None         Objective:   Vitals:   09/04/20 1100 09/04/20 1200 09/04/20 1400 09/04/20 1500  BP: 110/74 114/75 116/72 112/73  Pulse: 76 69 75 71  Resp: 15 19 14 13   Temp:      TempSrc:      SpO2: 100% 100% 100% 99%    Wt Readings from Last 3 Encounters:  08/30/20 80.3 kg  08/16/20 78.9 kg  08/13/20 79.8 kg    No intake or output data in the 24 hours ending 09/04/20 1819   Physical Exam  Gen:- Awake Alert,  in no apparent distress  HEENT:- [place].AT, No sclera icterus Neck-Supple Neck,No JVD,.  Lungs-  CTAB , fair symmetrical air movement CV- S1, S2 normal, regular  Abd-  +ve B.Sounds, Abd Soft, No tenderness,    Extremity/Skin:- No  edema, pedal pulses present  Psych-affect is appropriate, oriented x3 Neuro-no new focal deficits, no tremors   Data Review:   Micro Results Recent Results (from the past 240 hour(s))  Covid-19, Flu A+B ([facility])     Status: None   Collection Time: 09/02/20  8:46 AM   Specimen: Nasopharyngeal   Naso  Result Value Ref Range Status   SARS-CoV-2, NAA Not Detected Not Detected Final   Influenza A, NAA Not Detected Not Detected Final    Influenza B, NAA Not Detected Not Detected Final   Test Information: Comment  Final    Comment: This nucleic acid amplification test was developed and its performance characteristics determined by 09/04/20. Nucleic acid amplification tests include RT-PCR and TMA. This test has not been FDA cleared or approved. This test has been authorized by FDA under an Emergency Use Authorization (EUA). This test is only authorized for the duration of time the declaration that circumstances exist justifying the authorization of the emergency use of in vitro diagnostic tests for detection of SARS-CoV-2 virus and/or diagnosis of COVID-19 infection under section 564(b)(1) of the Act, 21 U.S.C. [employer]) (1), unless the authorization is terminated or revoked sooner. When diagnostic testing is negative, the possibility of a false negative result should be considered in the context of a patient's recent exposures and the presence of clinical signs and symptoms  consistent with COVID-19. An individual without symptoms of COVID-19 and who is not shedding SARS-CoV-2 virus wo uld expect to have a negative (not detected) result in this assay.   Resp Panel by RT-PCR (Flu A&B, Covid) Nasopharyngeal Swab     Status: None   Collection Time: 09/03/20 10:00 PM   Specimen: Nasopharyngeal Swab; Nasopharyngeal(NP) swabs in vial transport medium  Result Value Ref Range Status   SARS Coronavirus 2 by RT PCR NEGATIVE NEGATIVE Final    Comment: (NOTE) SARS-CoV-2 target nucleic acids are NOT DETECTED.  The SARS-CoV-2 RNA is generally detectable in upper respiratory specimens during the acute phase of infection. The lowest concentration of SARS-CoV-2 viral copies this assay can detect is 138 copies/mL. A negative result does not preclude SARS-Cov-2 infection and should not be used as the sole basis for treatment or other patient management decisions. A negative result may occur with  improper specimen  collection/handling, submission of specimen other than nasopharyngeal swab, presence of viral mutation(s) within the areas targeted by this assay, and inadequate number of viral copies(<138 copies/mL). A negative result must be combined with clinical observations, patient history, and epidemiological information. The expected result is Negative.  Fact Sheet for Patients:  BloggerCourse.comhttps://www.fda.gov/media/152166/download  Fact Sheet for Healthcare Providers:  SeriousBroker.ithttps://www.fda.gov/media/152162/download  This test is no t yet approved or cleared by the [place] FDA and  has been authorized for detection and/or diagnosis of SARS-CoV-2 by FDA under an Emergency Use Authorization (EUA). This EUA will remain  in effect (meaning this test can be used) for the duration of the COVID-19 declaration under Section 564(b)(1) of the Act, 21 U.S.C.section 360bbb-3(b)(1), unless the authorization is terminated  or revoked sooner.       Influenza A by PCR NEGATIVE NEGATIVE Final   Influenza B by PCR NEGATIVE NEGATIVE Final    Comment: (NOTE) The Xpert Xpress SARS-CoV-2/FLU/RSV plus assay is intended as an aid in the diagnosis of influenza from Nasopharyngeal swab specimens and should not be used as a sole basis for treatment. Nasal washings and aspirates are unacceptable for Xpert Xpress SARS-CoV-2/FLU/RSV testing.  Fact Sheet for Patients: BloggerCourse.comhttps://www.fda.gov/media/152166/download  Fact Sheet for Healthcare Providers: SeriousBroker.ithttps://www.fda.gov/media/152162/download  This test is not yet approved or cleared by the [place] FDA and has been authorized for detection and/or diagnosis of SARS-CoV-2 by FDA under an Emergency Use Authorization (EUA). This EUA will remain in effect (meaning this test can be used) for the duration of the COVID-19 declaration under Section 564(b)(1) of the Act, 21 U.S.C. section 360bbb-3(b)(1), unless the authorization is terminated or revoked.  Performed at [facility], [address] [place], [place] [address]     Radiology Reports MR ANGIO HEAD WO CONTRAST  Result Date: 08/13/2020 CLINICAL DATA:  Neuro deficit, acute, stroke suspected. Additional history provided: Patient reports bilateral weakness for 2 days. EXAM: MRI HEAD WITHOUT CONTRAST MRA HEAD WITHOUT CONTRAST TECHNIQUE: Multiplanar, multi-echo pulse sequences of the brain and surrounding structures were acquired without intravenous contrast. Angiographic images of the Circle of Willis were acquired using MRA technique without intravenous contrast. COMPARISON:  Prior non-contrast head CT examinations 08/12/2020 and earlier. FINDINGS: MRI HEAD FINDINGS The patient was unable to tolerate the full examination. As a result, only axial and coronal diffusion-weighted imaging, and a sagittal T1 weighted sequence, could be obtained. There is a 3 mm focus of restricted diffusion within the medial right temporal lobe/hippocampus, compatible with acute infarct. No acute infarct is identified elsewhere within the brain. MRA HEAD FINDINGS Anterior circulation:  The intracranial internal carotid arteries are patent. The M1 middle cerebral arteries are patent. No M2 proximal branch occlusion or high-grade proximal stenosis is identified. The anterior cerebral arteries are patent. 1-2 mm inferomedially projecting vascular protrusion arising from the distal cavernous left ICA, which may reflect an aneurysm or infundibulum (for instance as seen on series 9, image 86). Posterior circulation: The intracranial vertebral arteries are patent. The basilar artery is patent. The posterior cerebral arteries are patent. Posterior communicating arteries are hypoplastic or absent bilaterally. Anatomic variants: As described. IMPRESSION: MRI brain: 1. The patient was unable to tolerate the full examination. As a result, only axial and coronal diffusion-weighted imaging, and a sagittal T1 weighted sequence, could be obtained. 2. 3 mm focus  of restricted diffusion within the medial right temporal lobe/hippocampus, compatible with acute infarction. MRA head: 1. No intracranial large vessel occlusion or proximal high-grade arterial stenosis. 2. 1-2 mm inferomedially projecting vascular protrusion arising from the distal cavernous left ICA, which may reflect an aneurysm or infundibulum. Electronically Signed   By: Jackey Loge DO   On: 08/13/2020 09:35   MR BRAIN WO CONTRAST  Result Date: 09/04/2020 CLINICAL DATA:  Mental status change, unknown cause EXAM: MRI HEAD WITHOUT CONTRAST TECHNIQUE: Multiplanar, multiecho pulse sequences of the brain and surrounding structures were obtained without intravenous contrast. COMPARISON:  CT head 1 day prior, MR head 08/13/2020 FINDINGS: The patient was unable to complete the full exam. SWI and axial T1 images were not obtained. Within this confines: Brain: There is no evidence of acute intracranial hemorrhage, extra-axial fluid collection, or infarct. There is no parenchymal signal abnormality. The ventricles are not enlarged. There is no midline shift. No mass lesion isn't Vascular: Normal flow voids. Skull and upper cervical spine: Normal marrow signal. Sinuses/Orbits: The imaged paranasal sinuses are clear. The globes and orbits are unremarkable. Other: None. IMPRESSION: Incomplete study due to patient claustrophobia as above. SWI and axial T1 images were not obtained. Within this confine: No acute intracranial pathology. Electronically Signed   By: Lesia Hausen M.D.   On: 09/04/2020 08:25   MR BRAIN WO CONTRAST  Result Date: 08/13/2020 CLINICAL DATA:  Neuro deficit, acute, stroke suspected. Additional history provided: Patient reports bilateral weakness for 2 days. EXAM: MRI HEAD WITHOUT CONTRAST MRA HEAD WITHOUT CONTRAST TECHNIQUE: Multiplanar, multi-echo pulse sequences of the brain and surrounding structures were acquired without intravenous contrast. Angiographic images of the Circle of Willis were  acquired using MRA technique without intravenous contrast. COMPARISON:  Prior non-contrast head CT examinations 08/12/2020 and earlier. FINDINGS: MRI HEAD FINDINGS The patient was unable to tolerate the full examination. As a result, only axial and coronal diffusion-weighted imaging, and a sagittal T1 weighted sequence, could be obtained. There is a 3 mm focus of restricted diffusion within the medial right temporal lobe/hippocampus, compatible with acute infarct. No acute infarct is identified elsewhere within the brain. MRA HEAD FINDINGS Anterior circulation: The intracranial internal carotid arteries are patent. The M1 middle cerebral arteries are patent. No M2 proximal branch occlusion or high-grade proximal stenosis is identified. The anterior cerebral arteries are patent. 1-2 mm inferomedially projecting vascular protrusion arising from the distal cavernous left ICA, which may reflect an aneurysm or infundibulum (for instance as seen on series 9, image 86). Posterior circulation: The intracranial vertebral arteries are patent. The basilar artery is patent. The posterior cerebral arteries are patent. Posterior communicating arteries are hypoplastic or absent bilaterally. Anatomic variants: As described. IMPRESSION: MRI brain: 1. The patient was unable to  tolerate the full examination. As a result, only axial and coronal diffusion-weighted imaging, and a sagittal T1 weighted sequence, could be obtained. 2. 3 mm focus of restricted diffusion within the medial right temporal lobe/hippocampus, compatible with acute infarction. MRA head: 1. No intracranial large vessel occlusion or proximal high-grade arterial stenosis. 2. 1-2 mm inferomedially projecting vascular protrusion arising from the distal cavernous left ICA, which may reflect an aneurysm or infundibulum. Electronically Signed   By: Jackey Loge DO   On: 08/13/2020 09:35   US Carotid Bilateral (at [facility] and [facility] only)  Result Date: 08/13/2020 CLINICAL  DATA:  Right arm tingling EXAM: BILATERAL CAROTID DUPLEX ULTRASOUND TECHNIQUE: Wallace Cullens scale imaging, color Doppler and duplex ultrasound were performed of bilateral carotid and vertebral arteries in the neck. COMPARISON:  None. FINDINGS: Criteria: Quantification of carotid stenosis is based on velocity parameters that correlate the residual internal carotid diameter with NASCET-based stenosis levels, using the diameter of the distal internal carotid lumen as the denominator for stenosis measurement. The following velocity measurements were obtained: RIGHT ICA: 103/38 cm/sec CCA: 84/23 cm/sec SYSTOLIC ICA/CCA RATIO:  1.2 ECA:  55 cm/sec LEFT ICA: 92/42 cm/sec CCA: 93/27 cm/sec SYSTOLIC ICA/CCA RATIO:  1.0 ECA:  51 cm/sec RIGHT CAROTID ARTERY: No significant atherosclerotic plaque or evidence of stenosis in the internal carotid artery. RIGHT VERTEBRAL ARTERY:  Patent with normal antegrade flow. LEFT CAROTID ARTERY: No significant atherosclerotic plaque or evidence of stenosis in the internal carotid artery. LEFT VERTEBRAL ARTERY:  Patent with normal antegrade flow. IMPRESSION: Normal bilateral carotid duplex ultrasound. Signed, Sterling Big, MD, RPVI [facility] Radiology Electronically Signed   By: Malachy Moan M.D.   On: 08/13/2020 09:10   EEG adult  Result Date: 08/13/2020 Charlsie Quest, MD     08/13/2020  5:56 PM Patient Name: GALINA HADDOX MRN: [id number] Epilepsy Attending: Charlsie Quest Referring Physician/Provider: Dr Kendell Bane, Date: 08/13/2020 Duration: 23.15 mins Patient history: 40 y.o. female,with anxiety, CVA, HLD, vit D deficiency presents to the ED with a chief complaint of whole body paresthesias. EEG to evaluate for seizure. Level of alertness: Awake, asleep AEDs during EEG study: Xanax Technical aspects: This EEG study was done with scalp electrodes positioned according to the 10-20 International system of electrode  placement. Electrical activity was acquired at a sampling rate of  and reviewed with a high frequency filter of  and a low frequency filter of . EEG data were recorded continuously and digitally stored. Description: The posterior dominant rhythm consists of 11 Hz activity of moderate voltage (25-35 uV) seen predominantly in posterior head regions, symmetric and reactive to eye opening and eye closing. Sleep was characterized by vertex waves, sleep spindles (12 to 14 Hz), maximal frontocentral region.  Hyperventilation and photic stimulation were not performed.   IMPRESSION: This study is within normal limits. No seizures or epileptiform discharges were seen throughout the recording. Charlsie Quest   ECHOCARDIOGRAM COMPLETE  Result Date: 08/13/2020   Final    CT HEAD CODE STROKE WO CONTRAST  Result Date: 09/03/2020 CLINICAL DATA:  Code stroke. Neuro deficit, acute, stroke suspected; abnormal speech EXAM: CT HEAD WITHOUT CONTRAST TECHNIQUE: Contiguous axial images were obtained from the base of the skull through the vertex without intravenous contrast. COMPARISON:  09/02/2020 FINDINGS: Brain: There is no acute intracranial hemorrhage, mass effect, or edema. Gray-white differentiation is preserved. Ventricles and sulci are normal in size and configuration. No extra-axial collection. Vascular: No hyperdense vessel. Skull:  No new abnormality. Sinuses/Orbits: No new abnormality. Other: Mastoid air cells are clear. ASPECTS ([place] Stroke Program Early CT Score) - Ganglionic level infarction (caudate, lentiform nuclei, internal capsule, insula, M1-M3 cortex): 7 - Supraganglionic infarction (M4-M6 cortex): 3 Total score (0-10 with 10 being normal): 10 IMPRESSION: There is no acute intracranial hemorrhage or evidence of acute infarction. ASPECT score is 10. These results were called by telephone at the time of interpretation on 09/03/2020 at 7:50 pm to provider JOSHUA LONG , who verbally acknowledged  these results. Electronically Signed   By: Guadlupe Spanish M.D.   On: 09/03/2020 19:51   CT HEAD CODE STROKE WO CONTRAST  Result Date: 08/12/2020 CLINICAL DATA:  Code stroke. Acute anxiety with feeling of tingling all over EXAM: CT HEAD WITHOUT CONTRAST TECHNIQUE: Contiguous axial images were obtained from the base of the skull through the vertex without intravenous contrast. COMPARISON:  None. FINDINGS: Brain: There is no mass, hemorrhage or extra-axial collection. The size and configuration of the ventricles and extra-axial CSF spaces are normal. The brain parenchyma is normal, without evidence of acute or chronic infarction. Vascular: No abnormal hyperdensity of the major intracranial arteries or dural venous sinuses. No intracranial atherosclerosis. Skull: The visualized skull base, calvarium and extracranial soft tissues are normal. Sinuses/Orbits: No fluid levels or advanced mucosal thickening of the visualized paranasal sinuses. No mastoid or middle ear effusion. The orbits are normal. ASPECTS [facility] Stroke Program Early CT Score) - Ganglionic level infarction (caudate, lentiform nuclei, internal capsule, insula, M1-M3 cortex): 7 - Supraganglionic infarction (M4-M6 cortex): 3 Total score (0-10 with 10 being normal): 10 IMPRESSION: 1. Normal head CT. 2. ASPECTS is 10. Electronically Signed   By: Deatra Robinson M.D.   On: 08/12/2020 21:59     CBC Recent Labs  Lab 08/30/20 0901 09/03/20 1954 09/03/20 1956 09/04/20 0348  WBC 3.5  --  4.1 4.6  HGB 11.9 12.6 13.3 12.5  HCT 36.1 37.0 40.8 38.6  PLT 194  --  197 206  MCV 94  --  99.5 98.7  MCH 31.1  --  32.4 32.0  MCHC 33.0  --  32.6 32.4  RDW 12.5  --  13.2 13.2  LYMPHSABS 1.6  --  1.7  --   MONOABS  --   --  0.4  --   EOSABS 0.1  --  0.3  --   BASOSABS 0.0  --  0.0  --     Chemistries  Recent Labs  Lab 08/30/20 0901 09/03/20 1954 09/03/20 1956 09/04/20 0348  NA 138 140 136 135  K 4.0 3.5 3.5 3.5  CL 103 103 104 105  CO2 22  --   25 26  GLUCOSE 84 86 80 96  BUN CREATININE 0.85 0.90 0.79 0.79  CALCIUM 9.2  --  9.0 8.6*  AST 21  --  21  --   ALT 26  --  24  --   ALKPHOS 51  --  54  --   BILITOT 0.7  --  0.7  --    ------------------------------------------------------------------------------------------------------------------ No results for input(s): CHOL, HDL, LDLCALC, TRIG, CHOLHDL, LDLDIRECT in the last 72 hours.  Lab Results  Component Value Date   HGBA1C 5.8 (H) 08/13/2020   ------------------------------------------------------------------------------------------------------------------ No results for input(s): TSH, T4TOTAL, T3FREE, THYROIDAB in the last 72 hours.  Invalid input(s): FREET3 ------------------------------------------------------------------------------------------------------------------ No results for input(s): VITAMINB12, FOLATE, FERRITIN, TIBC, IRON, RETICCTPCT in the last 72 hours.  Coagulation profile Recent Labs  Lab 09/03/20 1956  INR 1.0    No results for input(s): DDIMER in the last 72 hours.  Cardiac Enzymes No results for input(s): CKMB, TROPONINI, MYOGLOBIN in the last 168 hours.  Invalid input(s): CK ------------------------------------------------------------------------------------------------------------------ No results found for: BNP   Shon Hale M.D on 09/04/2020 at 6:19 PM  Go to [email or web address] - for contact info  [facility] - Office  [phone]

## 2020-09-05 ENCOUNTER — Telehealth (INDEPENDENT_AMBULATORY_CARE_PROVIDER_SITE_OTHER): Payer: Medicaid Other | Admitting: Licensed Clinical Social Worker

## 2020-09-05 DIAGNOSIS — F419 Anxiety disorder, unspecified: Secondary | ICD-10-CM

## 2020-09-05 DIAGNOSIS — G3184 Mild cognitive impairment, so stated: Secondary | ICD-10-CM | POA: Diagnosis not present

## 2020-09-05 DIAGNOSIS — I639 Cerebral infarction, unspecified: Secondary | ICD-10-CM | POA: Diagnosis not present

## 2020-09-05 DIAGNOSIS — R6889 Other general symptoms and signs: Secondary | ICD-10-CM | POA: Diagnosis not present

## 2020-09-05 NOTE — Progress Notes (Signed)
[facility] Health [facility] (v[facility]) Psychiatric Consultant Case Review   Marissa [name] is a 40 y.o. year old female with a history of anxiety, history of a prior stroke secondary to a patent foramen ovale s/p  repair, migraine, hyperlipidemia. She reports history of anxiety since having a seizure in 2015. Worsening in anxiety in the setting of having neurological symptoms which includes loss of consciousness, She is not interested in any pharmacological treatment, and would like to have a second opinion regarding her neurological symptoms.   Assessment/Provisional Diagnosis # Unspecified anxiety  It is unclear whether she has somatic symptom and related disorder. Will continue to evaluate while providing treatment as below.   Recommendation  - BH specialist to provide CBT (She is not interested in pharmacological treatment)  Thank you for your consult. We will continue to follow the patient. Please contact vBHI  for any questions or concerns.   The above treatment considerations and suggestions are based on consultation with the [facility] specialist and/or PCP and a review of information available in the shared registry and the patient's Electronic Health Record (EHR). I have not personally examined the patient. All recommendations should be implemented with consideration of the patient's relevant prior history and current clinical status. Please feel free to call me with any questions about the care of this patient.

## 2020-09-05 NOTE — BH Specialist Note (Signed)
Virtual Behavioral Health Treatment Plan Team Note  MRN: [id number] NAME: Marissa [name]  DATE: 09/12/20  Start time:   430pEnd time:  435p Total time:  5 min  Total number of Virtual BH Treatment Team Plan encounters: 2/4  Treatment Team Attendees:  Nolon Rod, LCSW, Dr. Vanetta Shawl, Psychiatrist   Diagnoses:    ICD-10-CM   1. Anxiety  F41.9       Goals, Interventions and Follow-up Plan Goals: Increase healthy adjustment to current life circumstances Interventions: Solution-Focused Strategies Medication Management Recommendations: n/a she is not interested in pharmacological treatment Follow-up Plan: 2 week session  History of the present illness Presenting Problem/Current Symptoms:  Continued symptoms of diagnosis   Psychiatric History  Depression: No Anxiety: Yes Mania: No Psychosis: No PTSD symptoms: No  Past Psychiatric History/Hospitalization(s): Hospitalization for psychiatric illness: No Prior Suicide Attempts: No Prior Self-injurious behavior: No  Psychosocial stressors  Medical Previous stroke Self-harm Behaviors Risk Assessment none  Screenings PHQ-9 Assessments:  Depression screen [facility] 2/9 09/07/2020 08/30/2020 08/28/2020  Decreased Interest 0 0 0  Down, Depressed, Hopeless 0 0 0  PHQ - 2 Score 0 0 0  Altered sleeping 0 0 0  Tired, decreased energy 1 1 0  Change in appetite 3 0 0  Feeling bad or failure about yourself  0 0 0  Trouble concentrating 0 0 0  Moving slowly or fidgety/restless 0 0 0  Suicidal thoughts 0 0 0  PHQ-9 Score 4 1 0  Difficult doing work/chores Not difficult at all Not difficult at all Somewhat difficult   GAD-7 Assessments:  GAD 7 : Generalized Anxiety Score 08/28/2020 08/30/2019 03/22/2019 12/08/2018  Nervous, Anxious, on Edge 2 2 1  0  Control/stop worrying 1 0 1 1  Worry too much - different things 0 0 0 1  Trouble relaxing 1 0 0 0  Restless 0 0 0 0  Easily annoyed or irritable 1 0 0 1  Afraid - awful might happen  1 0 0 1  Total GAD 7 Score 6 2 2 4   Anxiety Difficulty Somewhat difficult Not difficult at all Somewhat difficult Somewhat difficult    Past Medical History 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) 10/08/2015   R  frontal   Common migraine with intractable migraine 11/08/2015   Encounter for gynecological examination with Papanicolaou smear of cervix 12/30/2018   Hyperlipidemia    Stroke (HCC)    Stroke (HCC)    Vitamin D deficiency     Vital signs: There were no vitals filed for this visit.  Allergies:  Allergies as of 09/05/2020 - Review Complete 09/03/2020  Allergen Reaction Noted   Triptans  11/09/2015   Morphine and related Hives 10/09/2015    Medication History Current medications:  Outpatient Encounter Medications as of 09/05/2020  Medication Sig   acetaminophen (TYLENOL) 500 MG tablet Take 1,000 mg by mouth every 6 (six) hours as needed. (Patient not taking: Reported on 09/07/2020)   aspirin 81 MG chewable tablet Chew 81 mg by mouth once as needed.   atorvastatin (LIPITOR) 40 MG tablet Take 1 tablet (40 mg total) by mouth daily. (Patient not taking: Reported on 09/07/2020)   cetirizine (ZYRTEC) 10 MG tablet Take 10 mg by mouth daily.   [DISCONTINUED] benzonatate (TESSALON) 200 MG capsule 1 capsule (Patient not taking: Reported on 09/07/2020)   [DISCONTINUED] brompheniramine-pseudoephedrine-DM 30-2-10 MG/5ML syrup Take by mouth. (Patient  not taking: No sig reported)   [DISCONTINUED] promethazine-dextromethorphan (PROMETHAZINE-DM) 6.25-15 MG/5ML syrup Take 5 mLs by mouth 4 (four) times daily as needed for cough. (Patient not taking: Reported on 09/07/2020)   No facility-administered encounter medications on file as of 09/05/2020.     Scribe for Treatment Team: [name], LCSW

## 2020-09-06 ENCOUNTER — Telehealth: Payer: Self-pay

## 2020-09-06 NOTE — Telephone Encounter (Signed)
Or [facility]

## 2020-09-06 NOTE — Telephone Encounter (Signed)
She was on my [facility] list as being admitted then discharged. I will see if I can get more information and records.

## 2020-09-06 NOTE — Telephone Encounter (Signed)
Transition Care Management Follow-up Telephone Call Date of discharge and from where: 09-04-20 from [facility]  How have you been since you were released from the hospital? Pt still weak and fatiged, some SOB on exertion.  Any questions or concerns? Yes, still feel unbalanced.   Items Reviewed: Did the pt receive and understand the discharge instructions provided? Yes  Medications obtained and verified? Yes  Other? No  Any new allergies since your discharge? No  Dietary orders reviewed? Yes Do you have support at home? Yes   Home Care and Equipment/Supplies: Were home health services ordered? no If so, what is the name of the agency? N/A  Has the agency set up a time to come to the patient's home? not applicable Were any new equipment or medical supplies ordered?  Yes: a walker What is the name of the medical supply agency? [facility] Were you able to get the supplies/equipment? yes Do you have any questions related to the use of the equipment or supplies? No  Functional Questionnaire: (I = Independent and D = Dependent) ADLs: I  Bathing/Dressing- I  Meal Prep- I   Eating- I  Maintaining continence- I   Transferring/Ambulation- I  Managing Meds- I   Follow up appointments reviewed:  PCP Hospital f/u appt confirmed? Yes  Scheduled to see Laury Axon, AGNP-C on 09/07/20 @ 9:00am. Specialist Hospital f/u appt confirmed? Yes  Scheduled to see [name] on 09/11/20 @ 3:00. Are transportation arrangements needed? No  If their condition worsens, is the pt aware to call PCP or go to the Emergency Dept.? Yes Was the patient provided with contact information for the PCP's office or ED? Yes Was to pt encouraged to call back with questions or concerns? Yes

## 2020-09-06 NOTE — Telephone Encounter (Signed)
That was just an ED note, so this isn't a TOC. She just went to their ED, had imaging and was discharged.

## 2020-09-06 NOTE — Telephone Encounter (Signed)
It might be from [facility] and she left AMA.

## 2020-09-06 NOTE — Progress Notes (Signed)
I'll need to review her notes. Can she submit a ROI to get records form [name] if they aren't already available?

## 2020-09-07 ENCOUNTER — Encounter: Payer: Self-pay | Admitting: Nurse Practitioner

## 2020-09-07 ENCOUNTER — Other Ambulatory Visit: Payer: Self-pay

## 2020-09-07 ENCOUNTER — Ambulatory Visit: Payer: Medicaid Other | Admitting: Nurse Practitioner

## 2020-09-07 DIAGNOSIS — G988 Other disorders of nervous system: Secondary | ICD-10-CM | POA: Diagnosis not present

## 2020-09-07 NOTE — Progress Notes (Signed)
Acute Office Visit  Subjective:    Patient ID: Marissa [name], female    DOB: 12-30-1980, 40 y.o.   MRN: [id number]  Chief Complaint  Patient presents with   Transitions Of Care    HPI Patient is in today for follow-up after being seen in [place] ED.  From that note: "Ms. Marissa [name] is a 40 y.o. Female with a PMH of anxiety and reported childhood stroke secondary to PFO presenting with multiple neurologic complaints.  Patient reports that on 7/24 she was driving and felt foggy headed. She called her husband who told her to pull to the side of the road. She then woke up on the side of the road in her car without recollection that she had pulled over. She is unsure if she lost consciousness. She then drove home and as she was coming out of the car she had a spell where she went limp and her knees buckled. She then developed whole body tingling. This prompted her to go to an OSH ED for further workup where she developed difficulty speaking that improved to whispering along with new right arm jerking that started at the same time as her whispering. MRI during that admission demonstrated a 65m focus of restricted diffusion with the medial right temporal lobe suspicious for stroke although neurologist was more convinced it was artifact and would not explain her symptoms. MRI was limited by motion artifact. MRA demonstrated possible 1-231minferomedially projecting vascular protrusion arising from the distal cavernous left ICA, which may reflect an aneurysm or infundibulum. By the time of discharge her symptoms resolved. 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  On 8/15, she developed recurrence of difficulty speaking with episodes of eyes closed with rapid blinking and preserved awareness as well as acute bilateral leg weakness. She was brought in by EMS to an OSH as a stroke code with normal  CTH and CTA not pursued given low concern for acute stroke. Repeat brain MRI was unremarkable although also limited by motion. Given that she had to wait 2 days to see a neurologist, [facility] She presents several days later for continued lower extremity weakness with difficulty initiating steps with slowed movements as well as episodes of RUE jerks, episodes of excessive blinking with retained awareness lasting several minutes, intermittent difficulty speaking and bilateral leg tingling....  #Multiple Neurologic Complaints Patient presents with multiple neurologic complaints including persistent difficulty ambulating, and intermittent episodes of mutism, excessive blinking with retained awareness, and intermittent RUE tremor and jerking. Neurologic exam is significant for preserved muscle strength, diminished sensation in a patchy distribution and bradykinetic gait. CTH was unremarkable. Her gait abnormality in the setting of normal strength is difficult to localize in the absence of signs or symptoms of spinal cord pathology, new brain pathology or evidence of injury to the peripheral nervous system. Given her physical exam, recurrent episodes of BLE weakness with prior complete resolution and recent normal Brain MRI, her presentation is most suggestive of a non-organic etiology. Given inability to see the images from her most recent MRI and prior concerns of a possible acute stroke, which would not explain her symptoms, recommend ambulatory MRI brain. Additionally, she is having transient spells with retained awareness as well as unilateral jerks concerning for epileptic vs non-epileptic spells. Prior EEG was unremarkable. Would recommend repeat ambulatory routine EEG at this time,  give persistent episodes. 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 Would recommend ambulatory referral for neurosurgery for continued follow-up.  RECOMMENDATIONS: -Ambulatory  MRI brain -Ambulatory Routine EEG -Ambulatory referral to neurosurgery for follow-up of ICA aneurysm  CNS imaging personally reviewed. This consultation was discussed with Dr. MRosetta Posnerwho agrees with the above. Recommendations were conveyed to primary team. Please page [phone] questions. 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   TUBAL LIGATION      Family History  Problem Relation Age of Onset   Hypertension Mother    Heart disease Maternal Uncle        murmur   Heart disease Maternal Grandmother    COPD Maternal Grandfather    Cancer Maternal Grandfather        lung   Heart disease Maternal Aunt        murmur   Migraines Neg Hx     Social History   Socioeconomic History   Marital status: Married    Spouse name: JRoderic Palau  Number of children: 3   Years of education: 12   Highest education level: Not on file  Occupational History   Occupation: Bakery assoc    Comment: [employer]  Tobacco Use   Smoking status: Never   Smokeless tobacco: Never  Substance and Sexual Activity   Alcohol use: No   Drug use: No   Sexual activity: Yes    Partners: Male    Birth control/protection: Surgical    Comment: tubal  Other Topics Concern   Not on file  Social History Narrative   Lives with husband and 3 children   Right-handed   Caffeine: rare   Social Determinants of Health   Financial Resource Strain: Not on file  Food Insecurity: Not on file  Transportation Needs: Not on file  Physical Activity: Not on file   Stress: Not on file  Social Connections: Not on file  Intimate Partner Violence: Not on file    Outpatient Medications Prior to Visit  Medication Sig Dispense Refill   aspirin 81 MG chewable tablet Chew 81 mg by mouth once as needed.     cetirizine (ZYRTEC) 10 MG tablet Take 10 mg by mouth daily.     acetaminophen (TYLENOL) 500 MG tablet Take 1,000 mg by mouth every 6 (six) hours as needed. (Patient not taking: Reported on 09/07/2020)     atorvastatin (LIPITOR) 40 MG tablet Take 1 tablet (40 mg total) by mouth daily. (Patient not taking: Reported on 09/07/2020) 30 tablet 11   benzonatate (TESSALON) 200 MG capsule 1 capsule (Patient not taking: Reported on 09/07/2020)     brompheniramine-pseudoephedrine-DM 30-2-10 MG/5ML syrup Take by mouth. (Patient not taking: No sig reported)     promethazine-dextromethorphan (PROMETHAZINE-DM) 6.25-15 MG/5ML syrup Take 5 mLs by mouth 4 (four) times daily as needed for cough. (Patient not taking: Reported on 09/07/2020) 140 mL 0   No facility-administered medications prior to visit.  Allergies  Allergen Reactions   Triptans     Cannot use triptans for migraine per neurology due to stroke history   Morphine And Related Hives    Feels like skin is burning     Review of Systems  Constitutional:  Positive for appetite change.       Decreased appetite  Respiratory: Negative.    Cardiovascular: Negative.   Neurological:  Positive for numbness.       Comes and goes to her legs; she states it is improving since she went to [name]      Objective:    Physical Exam Constitutional:      Appearance: Normal appearance.  Cardiovascular:     Rate and Rhythm: Normal rate and regular rhythm.     Pulses: Normal pulses.     Heart sounds: Normal heart sounds.  Pulmonary:     Effort: Pulmonary effort is normal.     Breath sounds: Normal breath sounds.  Musculoskeletal:     Comments: Using walker for ambulation; slow rapid alternating movements in BLE   Neurological:     Mental Status: She is alert and oriented to person, place, and time.     Cranial Nerves: No cranial nerve deficit.     Motor: Weakness present.     Coordination: Coordination abnormal.     Gait: Gait abnormal.     Comments: Using walker for ambulation    BP 125/81 (BP Location: Right Arm, Patient Position: Sitting, Cuff Size: Large)   Pulse (!) 102   Temp 98 F (36.7 C) (Oral)   Ht _0  (1.575 m)   Wt 163 lb (73.9 kg)   LMP 09/02/2020 (Exact Date)   SpO2 95%   BMI 29.81 kg/m  Wt Readings from Last 3 Encounters:  09/07/20 163 lb (73.9 kg)  08/30/20 177 lb (80.3 kg)  08/16/20 174 lb (78.9 kg)    Health Maintenance Due  Topic Date Due   COVID-19 Vaccine (3 - Booster for Moderna series) 10/12/2019    There are no preventive care reminders to display for this patient.   Lab Results  Component Value Date   TSH 2.980 08/30/2020   Lab Results  Component Value Date   WBC 4.6 09/04/2020   HGB 12.5 09/04/2020   HCT 38.6 09/04/2020   MCV 98.7 09/04/2020   PLT 206 09/04/2020   Lab Results  Component Value Date   NA 135 09/04/2020   K 3.5 09/04/2020   CO2 26 09/04/2020   GLUCOSE 96 09/04/2020   BUN 14 09/04/2020   CREATININE 0.79 09/04/2020   BILITOT 0.7 09/03/2020   ALKPHOS 54 09/03/2020   AST 21 09/03/2020   ALT 24 09/03/2020   PROT 7.6 09/03/2020   ALBUMIN 4.1 09/03/2020   CALCIUM 8.6 (L) 09/04/2020   ANIONGAP 4 (L) 09/04/2020   EGFR 89 08/30/2020   Lab Results  Component Value Date   CHOL 161 08/30/2020   Lab Results  Component Value Date   HDL 69 08/30/2020   Lab Results  Component Value Date   LDLCALC 83 08/30/2020   Lab Results  Component Value Date   TRIG 40 08/30/2020   Lab Results  Component Value Date   CHOLHDL 2.4 08/13/2020   Lab Results  Component Value Date   HGBA1C 5.8 (H) 08/13/2020       Assessment & Plan:   Problem List Items Addressed This Visit       Nervous and Auditory   Neurological disorder     -  unsure of etiology -she left hospital AMA and went to [facility]; she has [name] neurosurgery and neurology appointments in about 1 week -if she has any exacerbations she should go to ED -she states she has claustrophobia and wasn't able to complete MRI; neuro to order MRI if they deem it necessary        No orders of the defined types were placed in this encounter.   Time spent: 40 minutes  Noreene Larsson, NP

## 2020-09-07 NOTE — Assessment & Plan Note (Signed)
-  unsure of etiology -she left hospital AMA and went to [facility]; she has [name] neurosurgery and neurology appointments in about 1 week -if she has any exacerbations she should go to ED -she states she has claustrophobia and wasn't able to complete MRI; neuro to order MRI if they deem it necessary

## 2020-09-11 ENCOUNTER — Other Ambulatory Visit: Payer: Self-pay

## 2020-09-11 ENCOUNTER — Telehealth (INDEPENDENT_AMBULATORY_CARE_PROVIDER_SITE_OTHER): Payer: Medicaid Other | Admitting: Licensed Clinical Social Worker

## 2020-09-11 DIAGNOSIS — I671 Cerebral aneurysm, nonruptured: Secondary | ICD-10-CM | POA: Diagnosis not present

## 2020-09-11 DIAGNOSIS — F419 Anxiety disorder, unspecified: Secondary | ICD-10-CM

## 2020-09-12 ENCOUNTER — Telehealth: Payer: Self-pay | Admitting: Licensed Clinical Social Worker

## 2020-09-12 DIAGNOSIS — F419 Anxiety disorder, unspecified: Secondary | ICD-10-CM

## 2020-09-12 NOTE — Progress Notes (Signed)
[place] [facility] Follow Up Assessment  MRN: [id number] NAME: Marissa [name] Date: 09/12/20  Patient location: home Provider location: home office  Start time: 9a End time: 935a Total time: 35   Type of Contact: Follow up Call  Current concerns/stressors: medical concerns such as strokes and TIA  Screens/Assessment Tools:  PHQ-9 & GAD-7 Assessments: This is an evidence based assessment tool for depression and anxiety symptoms in adolescents and adults.  Score cut-off points for each section are as follows: 5-9: Mild, 10-14: Moderate, 15+: Severe  PHQ-9 for Depression = 4   GAD-7 for Anxiety = 14  How difficult have these problems made it for you to do your work, take care of things at home, or get along with other people? difficult  Functional Assessment:  Sleep: fair Appetite: fair Coping ability: overwhelmed Patient taking medications as prescribed:    Current medications:  Outpatient Encounter Medications as of 09/11/2020  Medication Sig   acetaminophen (TYLENOL) 500 MG tablet Take 1,000 mg by mouth every 6 (six) hours as needed. (Patient not taking: Reported on 09/07/2020)   aspirin 81 MG chewable tablet Chew 81 mg by mouth once as needed.   atorvastatin (LIPITOR) 40 MG tablet Take 1 tablet (40 mg total) by mouth daily. (Patient not taking: Reported on 09/07/2020)   cetirizine (ZYRTEC) 10 MG tablet Take 10 mg by mouth daily.   No facility-administered encounter medications on file as of 09/11/2020.    Self-harm and/or Suicidal Behaviors Risk Assessment Self-harm risk factors: n/a Patient endorses recent self injurious thoughts and/or behaviors:  no  Suicide ideations: No plan to harm self or others   Danger to Others Risk Assessment Danger to others risk factors: n/a Patient endorses recent thoughts of harming others: No    Substance Use Assessment Patient recently consumed alcohol: No  Patient recently used drugs: No  Patient is concerned about  dependence or abuse of substances: No    Goals, Interventions and Follow-up Plan Goals: Increase healthy adjustment to current life circumstances Interventions: Solution-Focused Strategies and CBT Cognitive Behavioral Therapy   Summary of Clinical Assessment  Marissa [name] is a 40 year old woman who presents for follow up regarding anxiety.  She has a history of being prescribed medication for anxiety.  Marissa [name] has not taken any of the medication due to history of mini strokes and TIA.  She is fearful that it may make her symptoms worse.  Since the last visit, she has been to urgent care and ED due to mobility and slowed speech concerns. She has a MRI scheduled today around 330p to determine any neurological concerns.  She has a history of not being able to have a full MRI due to being anxious.  During the session, discussion of relaxation techniques to help calm her down were discussed.  Will continue to assist with relaxation techniques.   Follow-up Plan:  continue [facility] sessions [name], LCSW

## 2020-09-12 NOTE — BH Specialist Note (Signed)
Virtual Behavioral Health Treatment Plan Team Note  MRN: [id number] NAME: Marissa [name]  DATE: 09/12/20  Start time:   310pEnd time:  315p Total time:  5 min  Total number of Virtual BH Treatment Team Plan encounters: 2/4  Treatment Team Attendees: Nolon Rod, LCSW & Dr. Vanetta Shawl Psychiatrist  Diagnoses: No diagnosis found.  Goals, Interventions and Follow-up Plan Goals: Increase healthy adjustment to current life circumstances Interventions: Solution-Focused Strategies CBT Cognitive Behavioral Therapy Medication Management Recommendations: n/a Follow-up Plan: continue VBH sessions  History of the present illness Presenting Problem/Current Symptoms: continued symptoms of DX  Psychiatric History  Depression: No Anxiety: Yes Mania: No Psychosis: No PTSD symptoms: No  Past Psychiatric History/Hospitalization(s): Hospitalization for psychiatric illness: No Prior Suicide Attempts: No Prior Self-injurious behavior: No  Psychosocial stressors health concerns  Self-harm Behaviors Risk Assessment n/a  Screenings PHQ-9 Assessments:  Depression screen [facility] 2/9 09/12/2020 09/07/2020 08/30/2020  Decreased Interest 0 0 0  Down, Depressed, Hopeless 0 0 0  PHQ - 2 Score 0 0 0  Altered sleeping 1 0 0  Tired, decreased energy 1 1 1   Change in appetite 1 3 0  Feeling bad or failure about yourself  0 0 0  Trouble concentrating 0 0 0  Moving slowly or fidgety/restless 1 0 0  Suicidal thoughts 0 0 0  PHQ-9 Score 4 4 1   Difficult doing work/chores Not difficult at all Not difficult at all Not difficult at all   GAD-7 Assessments:  GAD 7 : Generalized Anxiety Score 09/12/2020 08/28/2020 08/30/2019 03/22/2019  Nervous, Anxious, on Edge 2 2 2 1   Control/stop worrying 2 1 0 1  Worry too much - different things 2 0 0 0  Trouble relaxing 2 1 0 0  Restless 2 0 0 0  Easily annoyed or irritable 2 1 0 0  Afraid - awful might happen 2 1 0 0  Total GAD 7 Score 14 6 2 2   Anxiety  Difficulty Very difficult Somewhat difficult Not difficult at all Somewhat difficult    Past Medical History 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) 10/08/2015   R  frontal   Common migraine with intractable migraine 11/08/2015   Encounter for gynecological examination with Papanicolaou smear of cervix 12/30/2018   Hyperlipidemia    Stroke (HCC)    Stroke (HCC)    Vitamin D deficiency     Vital signs: There were no vitals filed for this visit.  Allergies:  Allergies as of 09/12/2020 - Review Complete 09/07/2020  Allergen Reaction Noted   Triptans  11/09/2015   Morphine and related Hives 10/09/2015    Medication History Current medications:  Outpatient Encounter Medications as of 09/12/2020  Medication Sig   acetaminophen (TYLENOL) 500 MG tablet Take 1,000 mg by mouth every 6 (six) hours as needed. (Patient not taking: Reported on 09/07/2020)   aspirin 81 MG chewable tablet Chew 81 mg by mouth once as needed.   atorvastatin (LIPITOR) 40 MG tablet Take 1 tablet (40 mg total) by mouth daily. (Patient not taking: Reported on 09/07/2020)   cetirizine (ZYRTEC) 10 MG tablet Take 10 mg by mouth daily.   No facility-administered encounter medications on file as of 09/12/2020.     Scribe for Treatment Team: 09/14/2020, LCSW

## 2020-09-14 ENCOUNTER — Telehealth: Payer: Self-pay

## 2020-09-14 ENCOUNTER — Other Ambulatory Visit: Payer: Self-pay

## 2020-09-14 DIAGNOSIS — Z8673 Personal history of transient ischemic attack (TIA), and cerebral infarction without residual deficits: Secondary | ICD-10-CM

## 2020-09-14 DIAGNOSIS — G459 Transient cerebral ischemic attack, unspecified: Secondary | ICD-10-CM

## 2020-09-14 NOTE — Telephone Encounter (Signed)
sure

## 2020-09-14 NOTE — Telephone Encounter (Signed)
Ok for referral?

## 2020-09-14 NOTE — Telephone Encounter (Signed)
Referral sent 

## 2020-09-14 NOTE — Telephone Encounter (Signed)
Patient calling she is requesting a referral to [name] outpatient [facility] to help strengthen her legs ph# [phone]

## 2020-09-17 ENCOUNTER — Telehealth: Payer: Self-pay | Admitting: Nurse Practitioner

## 2020-09-17 ENCOUNTER — Other Ambulatory Visit: Payer: Self-pay

## 2020-09-17 ENCOUNTER — Ambulatory Visit (HOSPITAL_COMMUNITY): Payer: Medicaid Other | Attending: Nurse Practitioner

## 2020-09-17 ENCOUNTER — Ambulatory Visit (HOSPITAL_COMMUNITY): Payer: Medicaid Other

## 2020-09-17 ENCOUNTER — Encounter (HOSPITAL_COMMUNITY): Payer: Self-pay

## 2020-09-17 DIAGNOSIS — R29898 Other symptoms and signs involving the musculoskeletal system: Secondary | ICD-10-CM | POA: Insufficient documentation

## 2020-09-17 DIAGNOSIS — M6281 Muscle weakness (generalized): Secondary | ICD-10-CM | POA: Insufficient documentation

## 2020-09-17 DIAGNOSIS — R262 Difficulty in walking, not elsewhere classified: Secondary | ICD-10-CM | POA: Insufficient documentation

## 2020-09-17 NOTE — Therapy (Signed)
[place] [facility] [address] [place], [place], [address] Phone: [phone]   Fax:  [phone]  Patient Details  Name: Marissa [name] MRN: [id number] Date of Birth: 04-Dec-1980 Referring Provider:  Heather Roberts, NP  Encounter Date: 09/17/2020  Patient arrived for OT evaluation, although once in treatment room, patient reports that she is having bilateral leg weakness. She experienced a CVA on 08/12/20. On 08/30/20 she underwent blood work and later that day she experienced a new onset of bilateral leg weakness. Patient called her primary physician requesting a referral to PT at our clinic. No deficits related to OT identified. Referral will be changed to PT for bilateral leg weakness and patient is scheduled for PT evaluation tomorrow 09/18/20.   [name], OTR/L,CBIS  [phone]  09/17/2020, 1:21 PM  [place] [facility] [address] [place], [place], [address] Phone: [phone]   Fax:  [phone]

## 2020-09-17 NOTE — Therapy (Deleted)
error 

## 2020-09-17 NOTE — Telephone Encounter (Signed)
Please place Physical Therapy orders

## 2020-09-17 NOTE — Telephone Encounter (Signed)
Received extended paperwork from the pt.  Marissa [name] has the original paperwork that was sent in

## 2020-09-18 ENCOUNTER — Telehealth: Payer: Medicaid Other | Admitting: Licensed Clinical Social Worker

## 2020-09-18 ENCOUNTER — Encounter (HOSPITAL_COMMUNITY): Payer: Self-pay | Admitting: Physical Therapy

## 2020-09-18 ENCOUNTER — Ambulatory Visit (HOSPITAL_COMMUNITY): Payer: Medicaid Other | Admitting: Physical Therapy

## 2020-09-18 ENCOUNTER — Other Ambulatory Visit: Payer: Self-pay

## 2020-09-18 DIAGNOSIS — R262 Difficulty in walking, not elsewhere classified: Secondary | ICD-10-CM

## 2020-09-18 DIAGNOSIS — M6281 Muscle weakness (generalized): Secondary | ICD-10-CM | POA: Diagnosis not present

## 2020-09-18 DIAGNOSIS — Z8673 Personal history of transient ischemic attack (TIA), and cerebral infarction without residual deficits: Secondary | ICD-10-CM

## 2020-09-18 DIAGNOSIS — R29898 Other symptoms and signs involving the musculoskeletal system: Secondary | ICD-10-CM | POA: Diagnosis not present

## 2020-09-18 NOTE — Therapy (Signed)
[facility] [facility] [address] [place], [place], [address] Phone: [phone]   Fax:  [phone]  Physical Therapy Evaluation  Patient Details  Name: Marissa [name] MRN: [id number] Date of Birth: [birth year] Referring Provider (PT): Heather Roberts, NP   Encounter Date: 09/18/2020   PT End of Session - 09/18/20 1049     Visit Number 1    Number of Visits 1    Authorization Type [employer] medicaid -    Authorization - Visit Number 0    Authorization - Number of Visits 0    Progress Note Due on Visit 10    PT Start Time 1049    PT Stop Time 1130    PT Time Calculation (min) 41 min             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) 10/08/2015   R  frontal   Common migraine with intractable migraine 11/08/2015   Encounter for gynecological examination with Papanicolaou smear of cervix 12/30/2018   Hyperlipidemia    Stroke (HCC)    Stroke (HCC)    Vitamin D deficiency     Past Surgical History:  Procedure Laterality Date   CESAREAN SECTION     PATENT FORAMEN OVALE CLOSURE  09/25/2015   TUBAL LIGATION      There were no vitals filed for this visit.    Subjective Assessment - 09/18/20 1103     Subjective States that on 08/12/20 she had a previous stroke years ago and she had a stroke again and went to MD. States that on Aug 12th that years she had some blood work done and then she had increased shaking and she felt like her feet weren't grabbing the floor. States that she has good days and bad days. States that last night she felt like she had a mini stroke where she had a right arm tremor, headache and tingling in her left leg. States that she didn't' go to the ER because she has been before and by the time she is seen symptoms are back to baseline. States she is seeing a neuro specialist is currently seeing her. States her primary MD sent  her to PT as she has had difficulties walking. States she is taking a daily baby aspirin but that they have not added any blood thinner. States that see is having a CT scan this Thursday and is wearing a hear monitor. States that she is following up on 9/28 and 9/30 at [name]. States that she feels like she is not being heard when she goes to the ED. States that she doesn't like to go to the ED because of this and her symptoms resolve after a few hours. States she has a RW but she doesn't like to use. it. States she currently works full time at [employer]. States that she as not been able to return since 09/01/20. States that she likes to stay active and takes care of her kids. States she is unable to drive.    Pertinent History hx of CVA    Currently in Pain? No/denies                [facility] PT Assessment - 09/18/20 0001       Assessment   Medical Diagnosis hx o CVA    Referring Provider (PT) Heather Roberts, NP  Onset Date/Surgical Date 08/12/20    Prior Therapy no      Balance Screen   Has the patient fallen in the past 6 months No      Prior Function   Level of Independence Independent    Vocation Full time employment    Vocation Requirements works Statistician    Leisure unable to work right now      Copy Status Within Functional Limits for tasks assessed      Observation/Other Assessments   Focus on Therapeutic Outcomes (FOTO)  NA      Sensation   Light Touch Impaired by gross assessment    Additional Comments reduced sensation bilaterally with R> L throughout both lower legs anterior and posterior      ROM / Strength   AROM / PROM / Strength Strength      Strength   Strength Assessment Site Hip;Knee;Ankle    Right/Left Hip Right;Left    Right Hip Flexion 2+/5    Left Hip Flexion 2/5    Right/Left Knee Right;Left    Right Knee Flexion 2+/5    Right Knee Extension 2/5    Left Knee Flexion 2/5    Left Knee Extension 2/5    Right/Left Ankle  Right;Left    Right Ankle Dorsiflexion 2+/5    Left Ankle Dorsiflexion 2+/5      Transfers   Transfers Sit to Stand;Stand to Sit    Sit to Stand With upper extremity assist;6: Modified independent (Device/Increase time)   slow labored movements   Stand to Sit 6: Modified independent (Device/Increase time);With upper extremity assist   slow labored movements     Ambulation/Gait   Ambulation/Gait Yes    Ambulation/Gait Assistance 5: Supervision    Ambulation Distance (Feet) 140 Feet    Assistive device None    Gait Pattern Decreased arm swing - right;Decreased step length - right;Decreased step length - left;Decreased stance time - right;Decreased stride length;Shuffle;Lateral trunk lean to left;Trunk flexed    Ambulation Surface Level;Indoor    Gait velocity decreased    Gait Comments                        Objective measurements completed on examination: See above findings.               PT Education - 09/18/20 1139     Education Details current condition, on plan moving forward, about concerns abotu PT with continued episodes, about not driving secondary to episodes    Person(s) Educated Patient    Methods Explanation    Comprehension Verbalized understanding              PT Short Term Goals - 09/18/20 1155       PT SHORT TERM GOAL #1   Title Patient will report importance of going to ED when symptoms present    Time 2    Period Weeks    Status New    Target Date 10/02/20                       Plan - 09/18/20 1144     Clinical Impression Statement Patient presents to therapy with complaints of difficulty walking and weakness after multiple TIAs with most recent episode occurring last night where patient did not seek medical attention for most recent TIA. Educated patient in current presentation, risks associated with not getting medical attention and encouraged patient to  follow up with MD with any additional episodes.  Patient to undergo CT later this week and wear hear monitor for 24 hours. Discussed putting PT on hold secondary to recent increase in frequency and patient still having active TIAs. Encouraged patient to pursue PT after she is cleared medically by MD. One time visit secondary to current progression of neurological episodes and needing to be cleared by MD. Anticipate patient to need PT in the future once cleared medically by MD.    Personal Factors and Comorbidities Comorbidity 1;Comorbidity 2    Comorbidities hx of CVA, hx of TIA    Examination-Activity Limitations Bed Mobility;Squat;Stairs;Transfers;Locomotion Level;Sit;Lift;Stand;Carry    Examination-Participation Restrictions Community Activity;Occupation;Shop;Meal Prep;Cleaning;Driving;Yard Work    Stability/Clinical Decision Making Evolving/Moderate complexity    Clinical Decision Making Moderate    Rehab Potential Fair    PT Frequency One time visit    PT Duration 2 weeks    PT Treatment/Interventions ADLs/Self Care Home Management    PT Next Visit Plan f/u with MD about tests    Consulted and Agree with Plan of Care Patient             Patient will benefit from skilled therapeutic intervention in order to improve the following deficits and impairments:  Decreased activity tolerance, Decreased endurance, Decreased range of motion, Decreased strength, Abnormal gait, Decreased balance, Decreased coordination, Decreased knowledge of precautions, Decreased mobility, Difficulty walking, Postural dysfunction, Impaired sensation  Visit Diagnosis: Difficulty in walking, not elsewhere classified  Muscle weakness (generalized)     Problem List Patient Active Problem List   Diagnosis Date Noted   Neurological disorder 09/07/2020   Speech disturbance 09/03/2020   Spell of abnormal behavior    Abnormal MRI of head    TIA (transient ischemic attack) 08/13/2020   Annual visit for general adult medical examination with abnormal findings  08/30/2019   History of CVA (cerebrovascular accident) 12/09/2018   Anxiety 12/09/2018   Obesity (BMI 30.0-34.9) 12/09/2018   Common migraine with intractable migraine 11/08/2015   PFO (patent foramen ovale) 10/08/2015   HLD (hyperlipidemia) 10/08/2015   Vitamin D deficiency 10/08/2015   1:10 PM, 09/18/20 Tereasa Coop, DPT Physical Therapy with [facility]  [phone] office   [facility] [address] [place], [place], [address] Phone: [phone]   Fax:  [phone]  Name: Marissa [name] MRN: [id number] Date of Birth: 1980/02/04

## 2020-09-18 NOTE — Telephone Encounter (Signed)
Orders placed. Faxed copy to Crystal as well at [facility] out patient therapy.

## 2020-09-20 ENCOUNTER — Ambulatory Visit: Payer: Medicaid Other | Admitting: Nurse Practitioner

## 2020-09-20 ENCOUNTER — Telehealth: Payer: Self-pay

## 2020-09-20 DIAGNOSIS — I671 Cerebral aneurysm, nonruptured: Secondary | ICD-10-CM | POA: Diagnosis not present

## 2020-09-20 DIAGNOSIS — Z0279 Encounter for issue of other medical certificate: Secondary | ICD-10-CM

## 2020-09-20 DIAGNOSIS — R2990 Unspecified symptoms and signs involving the nervous system: Secondary | ICD-10-CM | POA: Diagnosis not present

## 2020-09-20 DIAGNOSIS — R6889 Other general symptoms and signs: Secondary | ICD-10-CM | POA: Diagnosis not present

## 2020-09-20 DIAGNOSIS — R32 Unspecified urinary incontinence: Secondary | ICD-10-CM | POA: Diagnosis not present

## 2020-09-20 DIAGNOSIS — R5383 Other fatigue: Secondary | ICD-10-CM | POA: Diagnosis not present

## 2020-09-20 DIAGNOSIS — R569 Unspecified convulsions: Secondary | ICD-10-CM | POA: Diagnosis not present

## 2020-09-20 DIAGNOSIS — R253 Fasciculation: Secondary | ICD-10-CM | POA: Diagnosis not present

## 2020-09-20 DIAGNOSIS — R29818 Other symptoms and signs involving the nervous system: Secondary | ICD-10-CM | POA: Diagnosis not present

## 2020-09-20 DIAGNOSIS — R531 Weakness: Secondary | ICD-10-CM | POA: Diagnosis not present

## 2020-09-20 NOTE — Telephone Encounter (Signed)
FMLA forms faxed to [facility] [phone]

## 2020-09-26 ENCOUNTER — Telehealth: Payer: Self-pay | Admitting: Nurse Practitioner

## 2020-09-26 ENCOUNTER — Other Ambulatory Visit: Payer: Self-pay

## 2020-09-26 DIAGNOSIS — Z1231 Encounter for screening mammogram for malignant neoplasm of breast: Secondary | ICD-10-CM

## 2020-09-26 NOTE — Telephone Encounter (Signed)
FMLA   Copied Noted Sleeved  

## 2020-09-26 NOTE — Telephone Encounter (Signed)
Please place the order for the Mammogram  Also pt states NO to covid booster

## 2020-09-26 NOTE — Telephone Encounter (Signed)
Ordered

## 2020-10-02 ENCOUNTER — Other Ambulatory Visit: Payer: Self-pay

## 2020-10-02 ENCOUNTER — Telehealth (INDEPENDENT_AMBULATORY_CARE_PROVIDER_SITE_OTHER): Payer: Medicaid Other | Admitting: Licensed Clinical Social Worker

## 2020-10-02 DIAGNOSIS — F449 Dissociative and conversion disorder, unspecified: Secondary | ICD-10-CM

## 2020-10-02 DIAGNOSIS — F419 Anxiety disorder, unspecified: Secondary | ICD-10-CM

## 2020-10-03 ENCOUNTER — Telehealth (INDEPENDENT_AMBULATORY_CARE_PROVIDER_SITE_OTHER): Payer: Medicaid Other | Admitting: Licensed Clinical Social Worker

## 2020-10-03 ENCOUNTER — Ambulatory Visit (HOSPITAL_COMMUNITY)
Admission: RE | Admit: 2020-10-03 | Discharge: 2020-10-03 | Disposition: A | Discharge status: Home / Self Care | Payer: Medicaid Other | Source: Ambulatory Visit | Attending: Nurse Practitioner | Admitting: Nurse Practitioner

## 2020-10-03 ENCOUNTER — Other Ambulatory Visit: Payer: Self-pay

## 2020-10-03 DIAGNOSIS — F449 Dissociative and conversion disorder, unspecified: Secondary | ICD-10-CM

## 2020-10-03 DIAGNOSIS — Z1231 Encounter for screening mammogram for malignant neoplasm of breast: Secondary | ICD-10-CM | POA: Diagnosis not present

## 2020-10-03 DIAGNOSIS — F419 Anxiety disorder, unspecified: Secondary | ICD-10-CM

## 2020-10-03 IMAGING — MG MM DIGITAL SCREENING BILAT W/ TOMO AND CAD
8 series · 9 of 24 positions shown · non-contrast
Comparison: None.

CLINICAL DATA: Screening.

EXAM:
DIGITAL SCREENING BILATERAL MAMMOGRAM WITH TOMOSYNTHESIS AND CAD
TECHNIQUE: Bilateral screening digital craniocaudal and mediolateral oblique
mammograms were obtained. Bilateral screening digital breast
tomosynthesis was performed. The images were evaluated with
computer-aided detection.

[L CC synth-2D]
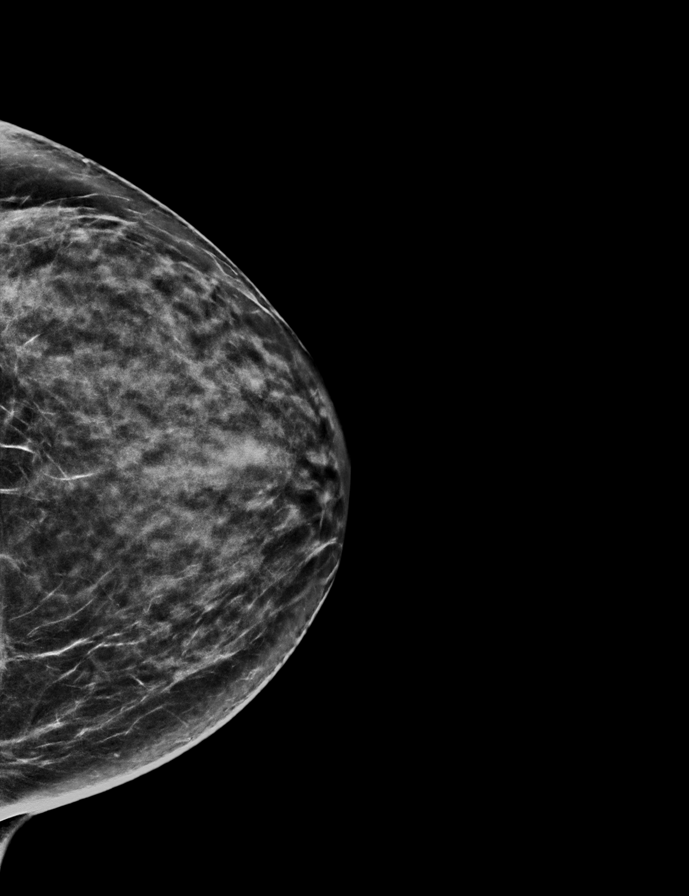

[R CC synth-2D]
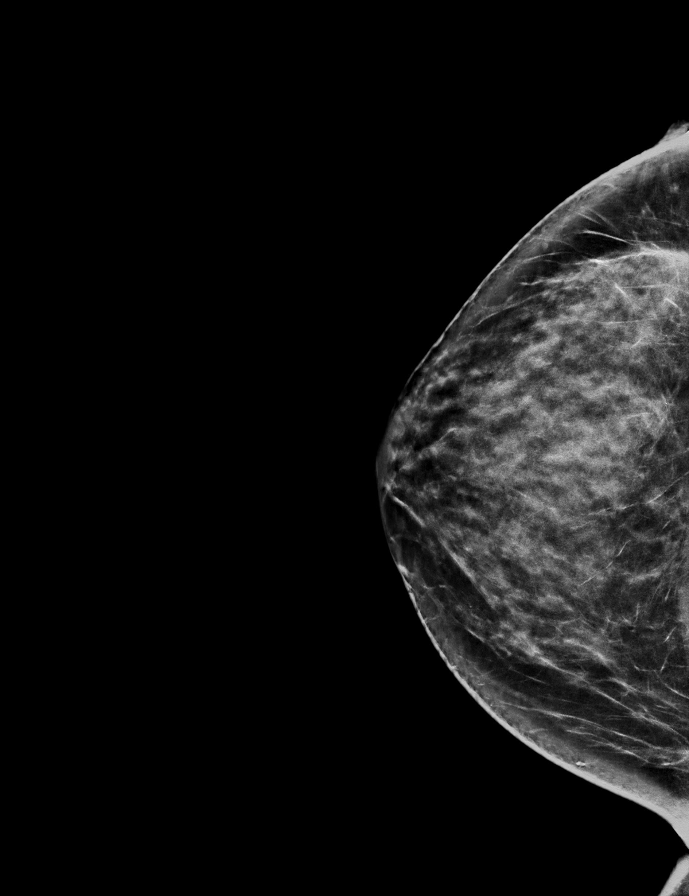

[L MLO synth-2D]
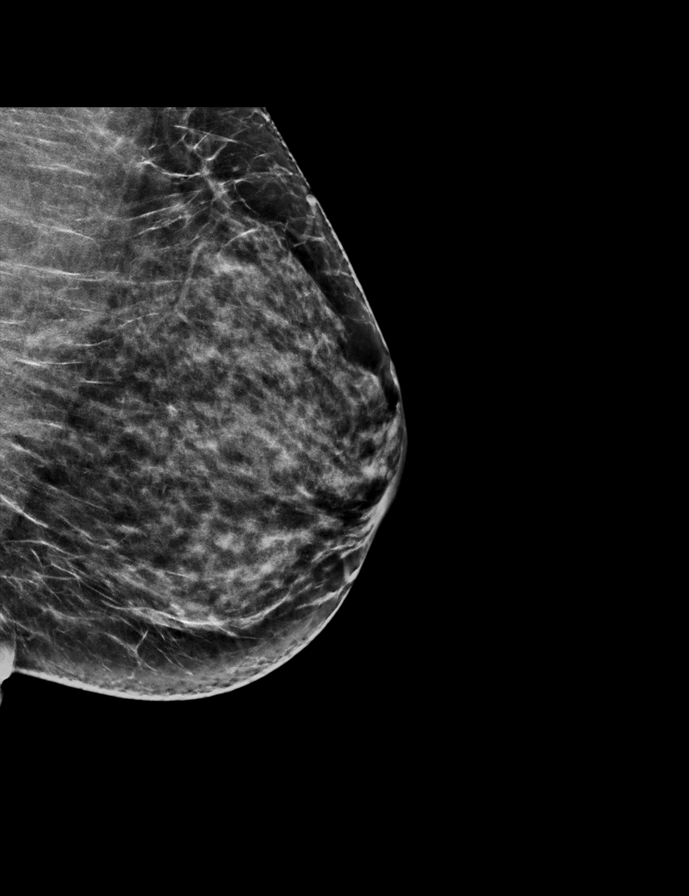

[R MLO synth-2D]
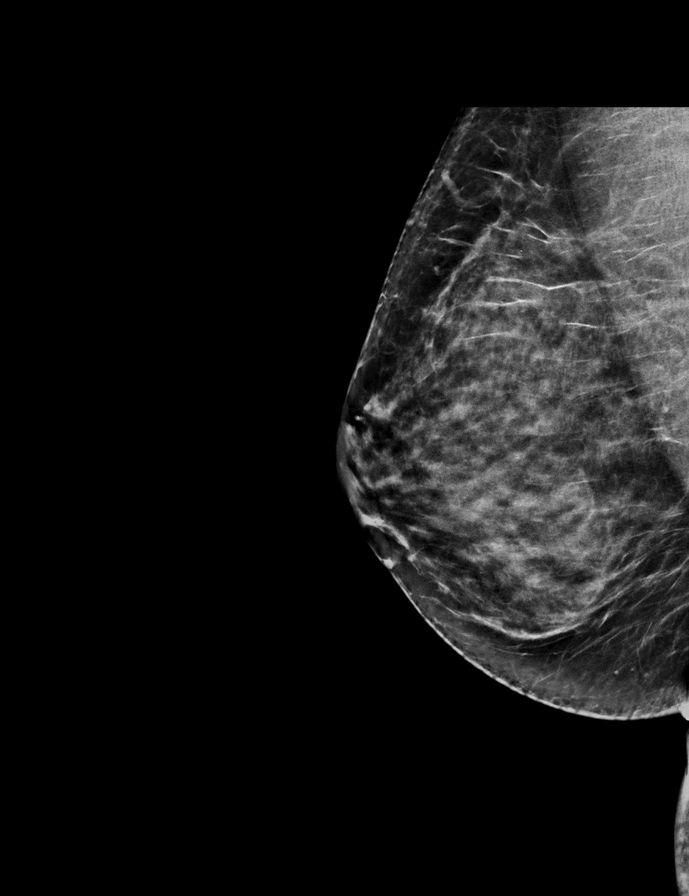

[L MLO tomo · 2 of 65 frames shown]
[frame 21/65]
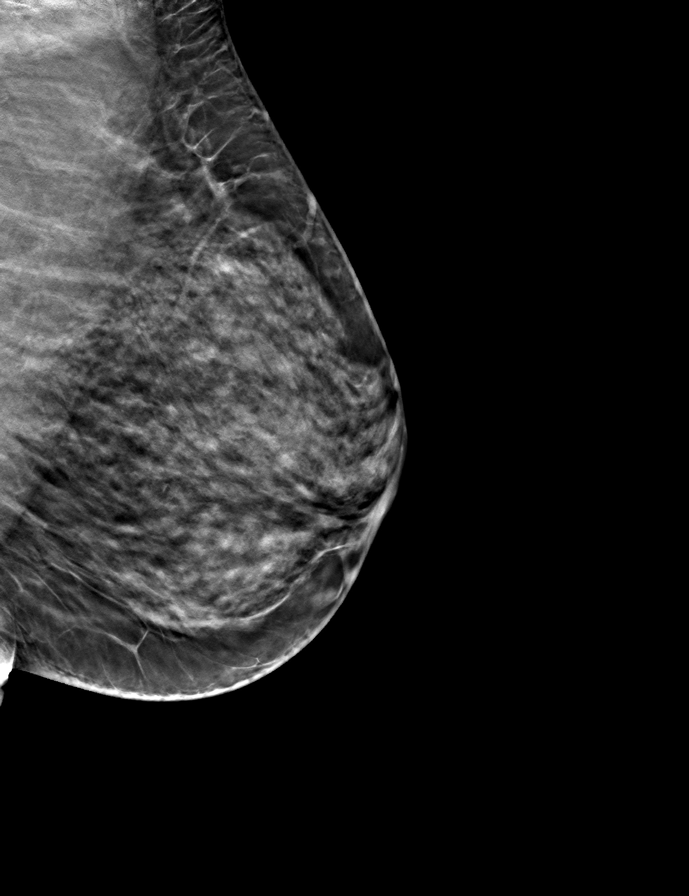
[frame 33/65]
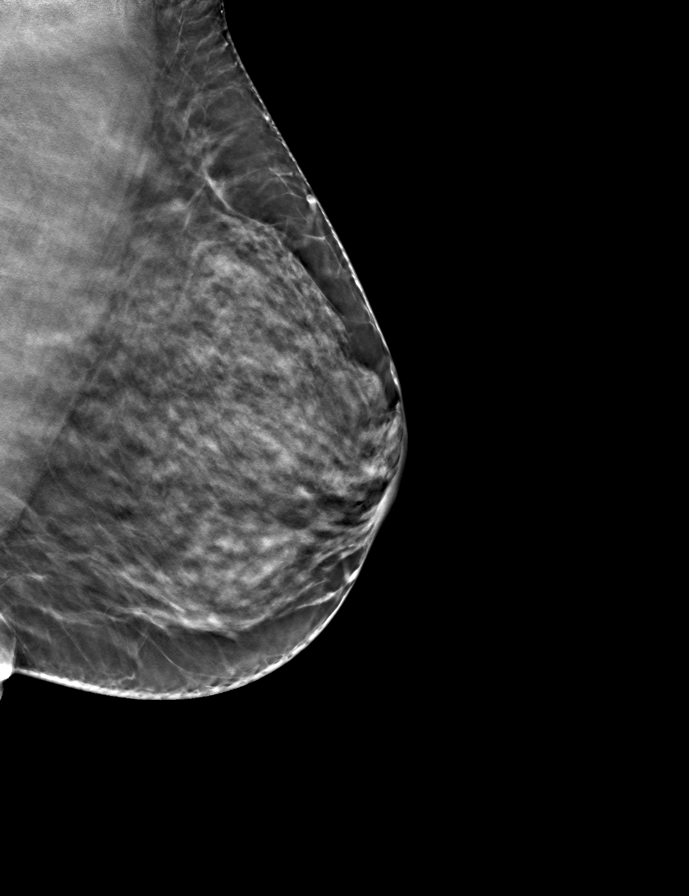

[L CC tomo · tomo slice 32/63.0]
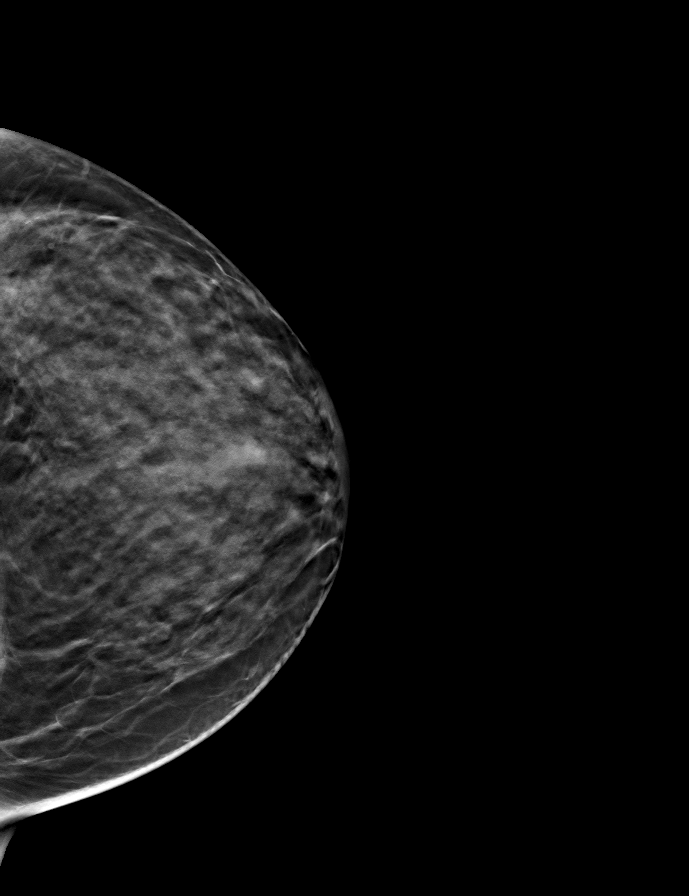

[R MLO tomo · tomo slice 36/71.0]
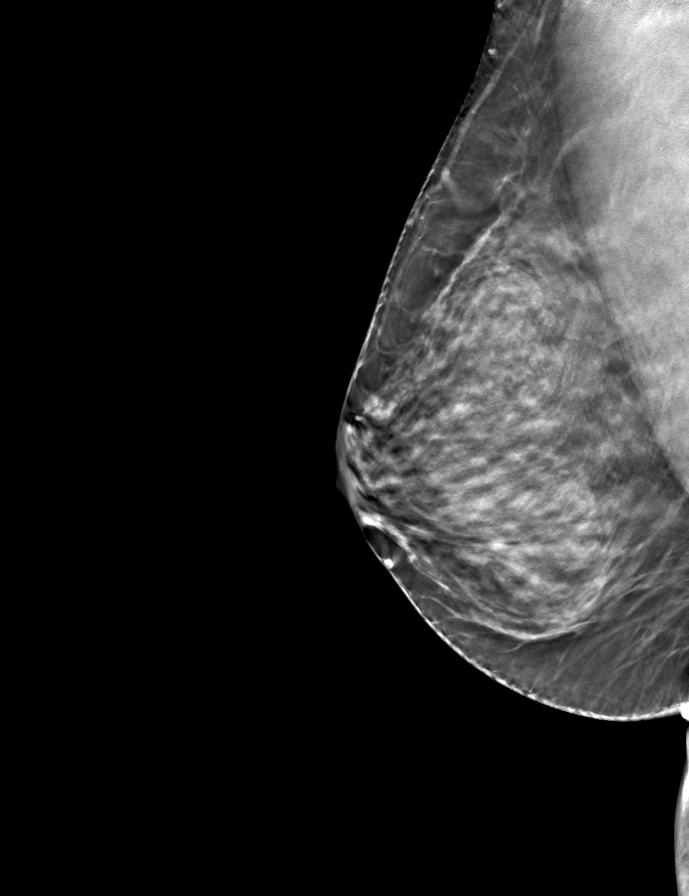

[R CC tomo · tomo slice 35/68.0]
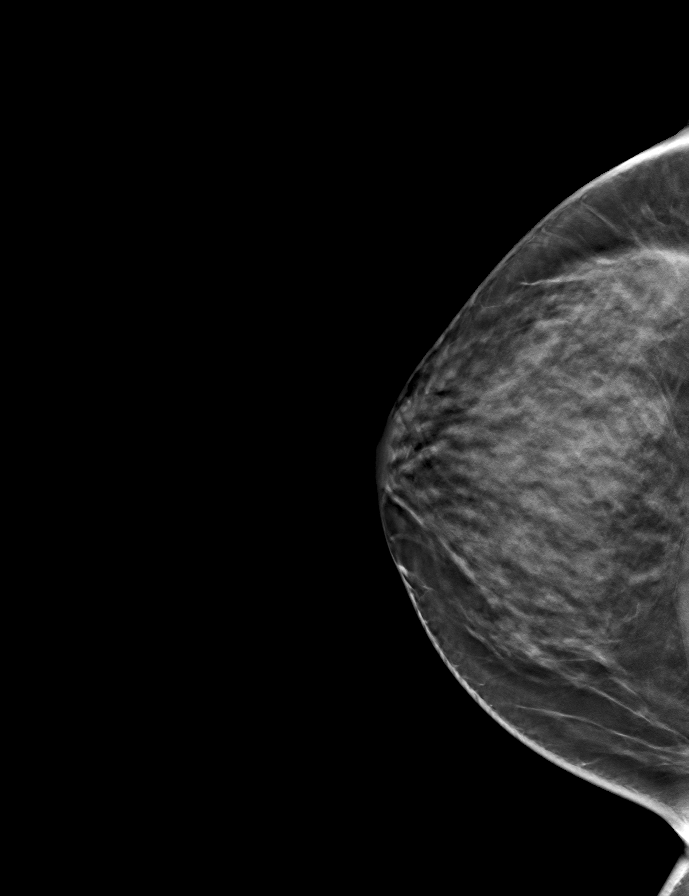

[9 of 24 positions shown; findings below may reference images not displayed]

ACR Breast Density Category c: The breast tissue is heterogeneously
dense, which may obscure small masses
FINDINGS: There are no findings suspicious for malignancy.
IMPRESSION: No mammographic evidence of malignancy. A result letter of this
screening mammogram will be mailed directly to the patient.

RECOMMENDATION:
Screening mammogram in one year. (Code:[6Z])

BI-RADS CATEGORY  1: Negative.

## 2020-10-03 NOTE — Progress Notes (Addendum)
  Patient: Home  Provider: Home Office Telephone visit      [facility] Follow Up Assessment  MRN: [id number] NAME: LETICIA COLETTA Date: 10/03/20  Start time: 1130a End time: 1145a Total time: 15  Type of Contact: Follow up Call  Current concerns/stressors: difficulty with walking  Screens/Assessment Tools: GAD 7 : Generalized Anxiety Score 10/03/2020 09/12/2020 08/28/2020 08/30/2019  Nervous, Anxious, on Edge 3 2 2 2   Control/stop worrying 3 2 1  0  Worry too much - different things 3 2 0 0  Trouble relaxing 2 2 1  0  Restless 2 2 0 0  Easily annoyed or irritable 1 2 1  0  Afraid - awful might happen 3 2 1  0  Total GAD 7 Score 17 14 6 2   Anxiety Difficulty Very difficult Very difficult Somewhat difficult Not difficult at all       Functional Assessment:  Sleep: poor  Appetite: fair Coping ability: exhausted Patient taking medications as prescribed:  no  Current medications:  Outpatient Encounter Medications as of 10/02/2020  Medication Sig   acetaminophen (TYLENOL) 500 MG tablet Take 1,000 mg by mouth every 6 (six) hours as needed. (Patient not taking: Reported on 09/07/2020)   aspirin 81 MG chewable tablet Chew 81 mg by mouth once as needed.   cetirizine (ZYRTEC) 10 MG tablet Take 10 mg by mouth daily.   No facility-administered encounter medications on file as of 10/02/2020.    Self-harm and/or Suicidal Behaviors Risk Assessment Self-harm risk factors: no Patient endorses recent self injurious thoughts and/or behaviors: No   Suicide ideations: No plan to harm self or others   Danger to Others Risk Assessment Danger to others risk factors: no Patient endorses recent thoughts of harming others: No    Substance Use Assessment Patient recently consumed alcohol: No  Patient recently used drugs: No  Patient is concerned about dependence or abuse of substances: No    Goals, Interventions and Follow-up Plan Goals: Increase healthy adjustment to  current life circumstances Interventions: Solution-Focused Strategies and Mindfulness or Relaxation Training   Summary of Clinical Assessment  Terin is a 40 yr old woman for follow up today.  She continues to have difficulty with lack of diagnosis with her symptoms. Her last doctor's visit ruled out stroke, epiplepsy, etc and she was referred to neurologist.  Discussion of stress and how to manage.  Provided psychoeducation on Conversion Disorder and how stress management can possibly reduce some of her physical symptoms. She is not taking her medication that was prescribed for anxiety.   Follow-up Plan:  weekly VBH session , LCSW

## 2020-10-03 NOTE — Progress Notes (Signed)
[facility] ([facility]) Psychiatric Consultant Case Review   Marissa [name] is a 40 y.o. year old female with a history of anxiety, history of a prior stroke secondary to a patent foramen ovale s/p  repair, migraine, hyperlipidemia. She reports history of anxiety since having a seizure in 2015. Worsening in anxiety in the setting of having neurological symptoms which includes loss of consciousness. She is not interested in pharmacological treatment until she finds the cause of her physical symptoms.   Per chart review, she was referred to [facility] neurology. CTA was not consistent with her clinical symptoms. EEG was not consistent with seizures.   Assessment/Provisional Diagnosis # Unspecified anxiety  # r/o conversion disorder, somatic symptom disorder BH specialist to provide psychoeducation about the above probable diagnosis, and offer CBT for anxiety.   Recommendation  - BH specialist to provide CBT (She is not interested in pharmacological treatment)   Thank you for your consult. We will continue to follow the patient. Please contact vBHI  for any questions or concerns.   The above treatment considerations and suggestions are based on consultation with the [facility] specialist and/or PCP and a review of information available in the shared registry and the patient's Electronic Health Record (EHR). I have not personally examined the patient. All recommendations should be implemented with consideration of the patient's relevant prior history and current clinical status. Please feel free to call me with any questions about the care of this patient.

## 2020-10-03 NOTE — BH Specialist Note (Signed)
Virtual Behavioral Health Treatment Plan Team Note  MRN: [id number] NAME: Marissa [name]  DATE: 10/03/20  Start time: 220p  End time:  225p Total time:  5 min  Total number of Virtual BH Treatment Team Plan encounters: 3/4  Treatment Team Attendees: Nolon Rod, LCSW & Dr. Vanetta Shawl, Psychiatrist  Diagnoses: No diagnosis found.  Goals, Interventions and Follow-up Plan Goals: Increase healthy adjustment to current life circumstances Interventions: Solution-Focused Strategies Mindfulness or Relaxation Training Medication Management Recommendations: n/a; Patient is not interested in pharmacological treatment at this time Follow-up Plan: weekly VBH session  History of the present illness Presenting Problem/Current Symptoms: physical symptoms that are unexplained  Psychiatric History  Depression: No Anxiety: Yes Mania: No Psychosis: No PTSD symptoms: No  Past Psychiatric History/Hospitalization(s): Hospitalization for psychiatric illness: No Prior Suicide Attempts: No Prior Self-injurious behavior: No  Psychosocial stressors unexplained physical symptoms  Self-harm Behaviors Risk Assessment   Screenings PHQ-9 Assessments:  Depression screen [facility] 2/9 09/12/2020 09/07/2020 08/30/2020  Decreased Interest 0 0 0  Down, Depressed, Hopeless 0 0 0  PHQ - 2 Score 0 0 0  Altered sleeping 1 0 0  Tired, decreased energy 1 1 1   Change in appetite 1 3 0  Feeling bad or failure about yourself  0 0 0  Trouble concentrating 0 0 0  Moving slowly or fidgety/restless 1 0 0  Suicidal thoughts 0 0 0  PHQ-9 Score 4 4 1   Difficult doing work/chores Not difficult at all Not difficult at all Not difficult at all   GAD-7 Assessments:  GAD 7 : Generalized Anxiety Score 10/03/2020 09/12/2020 08/28/2020 08/30/2019  Nervous, Anxious, on Edge 3 2 2 2   Control/stop worrying 3 2 1  0  Worry too much - different things 3 2 0 0  Trouble relaxing 2 2 1  0  Restless 2 2 0 0  Easily annoyed or  irritable 1 2 1  0  Afraid - awful might happen 3 2 1  0  Total GAD 7 Score 17 14 6 2   Anxiety Difficulty Very difficult Very difficult Somewhat difficult Not difficult at all    Past Medical History 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) 10/08/2015   R  frontal   Common migraine with intractable migraine 11/08/2015   Encounter for gynecological examination with Papanicolaou smear of cervix 12/30/2018   Hyperlipidemia    Stroke (HCC)    Stroke (HCC)    Vitamin D deficiency     Vital signs: There were no vitals filed for this visit.  Allergies:  Allergies as of 10/03/2020 - Review Complete 09/18/2020  Allergen Reaction Noted   Triptans  11/09/2015   Morphine and related Hives 10/09/2015    Medication History Current medications:  Outpatient Encounter Medications as of 10/03/2020  Medication Sig   acetaminophen (TYLENOL) 500 MG tablet Take 1,000 mg by mouth every 6 (six) hours as needed. (Patient not taking: Reported on 09/07/2020)   aspirin 81 MG chewable tablet Chew 81 mg by mouth once as needed.   atorvastatin (LIPITOR) 40 MG tablet Take 1 tablet (40 mg total) by mouth daily. (Patient not taking: Reported on 09/07/2020)   cetirizine (ZYRTEC) 10 MG tablet Take 10 mg by mouth daily.   No facility-administered encounter medications on file as of 10/03/2020.     Scribe for Treatment Team: 10/05/2020, LCSW

## 2020-10-09 NOTE — Progress Notes (Signed)
Mammogram negative. Repeat screening in 1 year.

## 2020-10-16 ENCOUNTER — Ambulatory Visit (INDEPENDENT_AMBULATORY_CARE_PROVIDER_SITE_OTHER): Payer: Medicaid Other | Admitting: Licensed Clinical Social Worker

## 2020-10-16 ENCOUNTER — Other Ambulatory Visit: Payer: Self-pay

## 2020-10-16 DIAGNOSIS — F449 Dissociative and conversion disorder, unspecified: Secondary | ICD-10-CM

## 2020-10-16 DIAGNOSIS — F419 Anxiety disorder, unspecified: Secondary | ICD-10-CM

## 2020-10-17 ENCOUNTER — Ambulatory Visit: Payer: Medicaid Other | Admitting: Neurology

## 2020-10-17 ENCOUNTER — Encounter: Payer: Self-pay | Admitting: Neurology

## 2020-10-17 VITALS — BP 109/77 | HR 64 | Ht 62.0 in | Wt 177.0 lb

## 2020-10-17 DIAGNOSIS — F451 Undifferentiated somatoform disorder: Secondary | ICD-10-CM

## 2020-10-17 NOTE — Progress Notes (Signed)
[facility]  PATIENT: Marissa [name] DOB: 1980-04-20  REFERRING CLINICIAN: Heather Roberts, NP HISTORY FROM: Patient and mother  REASON FOR VISIT: Difficulty walking/Dizziness/Abnormal Eye movements/   HISTORICAL  CHIEF COMPLAINT:  Chief Complaint  Patient presents with   New Patient (Initial Visit)    Rm 12, with mother,     HISTORY OF PRESENT ILLNESS:  This is a 40 year old woman with past medical history of stroke in 2017, migraine and vitamin D deficiency who is presenting with multiple complaints of difficulty walking, weakness, dizziness, abnormal eye movements.  Patient states stated that symptoms started on July 24 while driving she started feeling dizzy and losing sense of direction.  She talked to her husband who told her to move on the side of the road and he helped her get out of the car.  Patient stated she did not remember much of the event, she remembered being home walking up to the couch, started having right arm tremor and she remember people talking to her but she could not talk she could not walk.  On August 12 she had blood work done.  Then her legs start feeling weak and wobbly she remembers falling at home and could not walk.  Since August 12 she have problems with weakness in her legs, problem with balance, she also have episodes of uncontrolled eye blinking. During this time, she is aware of her surroundings but she is unable to speak.  This episode lasted 15 to 20 seconds.  During this time she had multiple ED admission-MRIs of the brain which was negative for any acute intracranial abnormality.  She has also seen multiple neurologists including a recent visit to [facility].  At [facility] she did have a repeat CTA we did not show any evidence of aneurysm and she had a 24-hour ambulatory EEG in which for an episode of eye blinking were captured without EEG correlate those events were deemed to be nonepileptic.  On  further note patient report a lot of stressors from home she is married with 3 kids.  Her husband is currently not working and trying to get disability, he has a diagnosis of heart failure with decrease ejection fraction.  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  There is also stress related to her children at school.    OTHER MEDICAL CONDITIONS: Stroke 2017, Migraines, Vit D deficiency    REVIEW OF SYSTEMS: Full 14 system review of systems performed and negative with exception of: as noted in the HPI.   ALLERGIES: Allergies  Allergen Reactions   Triptans     Cannot use triptans for migraine per neurology due to stroke history   Morphine And Related Hives    Feels like skin is burning     HOME MEDICATIONS: Outpatient Medications Prior to Visit  Medication Sig Dispense Refill   acetaminophen (TYLENOL) 500 MG tablet Take 1,000 mg by mouth every 6 (six) hours as needed.     aspirin 81 MG chewable tablet Chew 81 mg by mouth once as needed.     cetirizine (ZYRTEC) 10 MG tablet Take 10 mg by mouth daily.     atorvastatin (LIPITOR) 40 MG tablet Take 1 tablet (40 mg total) by mouth daily. (Patient not taking: Reported on 09/07/2020) 30 tablet 11   No facility-administered medications prior to visit.    PAST MEDICAL HISTORY: Past Medical History:  Diagnosis Date  Allergy    seasonal   Anxiety    Cerebrovascular accident (CVA) due to embolism (HCC) 10/08/2015   R  frontal   Common migraine with intractable migraine 11/08/2015   Encounter for gynecological examination with Papanicolaou smear of cervix 12/30/2018   Hyperlipidemia    Stroke (HCC)    Stroke (HCC)    Vitamin D deficiency     PAST SURGICAL HISTORY: Past Surgical History:  Procedure Laterality Date   CESAREAN SECTION     PATENT FORAMEN OVALE CLOSURE  09/25/2015   TUBAL LIGATION      FAMILY HISTORY: Family History  Problem Relation Age of Onset   Hypertension  Mother    Heart disease Maternal Uncle        murmur   Heart disease Maternal Grandmother    COPD Maternal Grandfather    Cancer Maternal Grandfather        lung   Heart disease Maternal Aunt        murmur   Migraines Neg Hx     SOCIAL HISTORY: Social History   Socioeconomic History   Marital status: Married    Spouse name: Christiane Ha   Number of children: 3   Years of education: 12   Highest education level: Not on file  Occupational History   Occupation: Bakery assoc    Comment: [employer]  Tobacco Use   Smoking status: Never   Smokeless tobacco: Never  Substance and Sexual Activity   Alcohol use: No   Drug use: No   Sexual activity: Yes    Partners: Male    Birth control/protection: Surgical    Comment: tubal  Other Topics Concern   Not on file  Social History Narrative   Lives with husband and 3 children   Right-handed   Caffeine: rare   Social Determinants of Health   Financial Resource Strain: Not on file  Food Insecurity: Not on file  Transportation Needs: Not on file  Physical Activity: Not on file  Stress: Not on file  Social Connections: Not on file  Intimate Partner Violence: Not on file     PHYSICAL EXAM  GENERAL EXAM/CONSTITUTIONAL: Vitals:  Vitals:   10/17/20 1003  BP: 109/77  Pulse: 64  Weight: 177 lb (80.3 kg)  Height: 5\' 2"  (1.575 m)   Body mass index is 32.37 kg/m. Wt Readings from Last 3 Encounters:  10/17/20 177 lb (80.3 kg)  09/07/20 163 lb (73.9 kg)  08/30/20 177 lb (80.3 kg)   Patient is in no distress; well developed, nourished and groomed; neck is supple  EYES: Pupils round and reactive to light, Visual fields full to confrontation, Extraocular movements intacts,   MUSCULOSKELETAL: Gait, strength, tone, movements noted in Neurologic exam below  NEUROLOGIC: MENTAL STATUS:  No flowsheet data found. awake, alert, oriented to person, place and time recent and remote memory intact normal attention and  concentration language fluent, comprehension intact, naming intact fund of knowledge appropriate  CRANIAL NERVE:  2nd, 3rd, 4th, 6th - pupils equal and reactive to light, visual fields full to confrontation, extraocular muscles intact, no nystagmus 5th - facial sensation symmetric 7th - facial strength symmetric 8th - hearing intact 9th - palate elevates symmetrically, uvula midline 11th - shoulder shrug symmetric 12th - tongue protrusion midline  MOTOR:  normal bulk and tone, full strength in the BUE, BLE  SENSORY:  normal and symmetric to light touch, pinprick, temperature, vibration  COORDINATION:  finger-nose-finger, fine finger movements normal  REFLEXES:  deep tendon reflexes  present and symmetric  GAIT/STATION:  normal     DIAGNOSTIC DATA (LABS, IMAGING, TESTING) - I reviewed patient records, labs, notes, testing and imaging myself where available.  Lab Results  Component Value Date   WBC 4.6 09/04/2020   HGB 12.5 09/04/2020   HCT 38.6 09/04/2020   MCV 98.7 09/04/2020   PLT 206 09/04/2020      Component Value Date/Time   NA 135 09/04/2020 0348   NA 138 08/30/2020 0901   K 3.5 09/04/2020 0348   CL 105 09/04/2020 0348   CO2 26 09/04/2020 0348   GLUCOSE 96 09/04/2020 0348   BUN 14 09/04/2020 0348   BUN 11 08/30/2020 0901   CREATININE 0.79 09/04/2020 0348   CREATININE 0.81 09/05/2019 1154   CALCIUM 8.6 (L) 09/04/2020 0348   PROT 7.6 09/03/2020 1956   PROT 6.6 08/30/2020 0901   ALBUMIN 4.1 09/03/2020 1956   ALBUMIN 4.1 08/30/2020 0901   AST 21 09/03/2020 1956   ALT 24 09/03/2020 1956   ALKPHOS 54 09/03/2020 1956   BILITOT 0.7 09/03/2020 1956   BILITOT 0.7 08/30/2020 0901   GFRNONAA >60 09/04/2020 0348   GFRNONAA 91 09/05/2019 1154   GFRAA 106 09/05/2019 1154   Lab Results  Component Value Date   CHOL 161 08/30/2020   HDL 69 08/30/2020   LDLCALC 83 08/30/2020   TRIG 40 08/30/2020   CHOLHDL 2.4 08/13/2020   Lab Results  Component Value Date    HGBA1C 5.8 (H) 08/13/2020   No results found for: [id number] Lab Results  Component Value Date   TSH 2.980 08/30/2020    CTA Head 09/20/2020 1.  No aneurysm, significant stenosis or flow limiting lesion within the intracranial vasculature. 2.  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   Amb EEG 09/21/2020 During the course of this 23 hour ambulatory EEG recording, the interictal EEG showed: a normal background. There were 4 events, consisting of eye twitching or blinking with no ictal correlates. The 4th event also included pins and needles sensation, sense of loss of urinary continence and tiredness afterwards.  Clinical Correlation:  Based on the above clinical and EEG data: The patient's typical episodes were recorded and are not consistent with epileptic seizures. They most likely represent neurologic symptoms.   ASSESSMENT AND PLAN  40 y.o. year old female with past medical history of stroke in 2017, PFO status postrepair, vitamin D deficiency who is presenting with multiple complaints of weakness, inability to walk, episodes of severe eye blinking.  Patient has extensive neurological work-up including MRIs, CTA, and EEG and they were all negative.  No indication of tumor, no stroke and no seizures.  However she does report a lot of stressors from home.  She reported husband is sick with heart failure, he has a defibrillator/pacemaker due to poor EF.  He is currently not working.  She is only 1 working and trying to manage her husband's illness and education of her 3 kids.  She also reported money is a big stressor.  She does report she does not have any free time to do activity that she enjoys I spent a lot of time explained to the patient that we could not find any neurological etiology for her symptoms.  I explained to her that this is likely somatization from her current level of stress. She is currently getting  counseling with her therapist; advised her to continue with therapy and to follow-up with me in  29-month. And I also advise her to accept help from family or friend as she reports numerous time she can handle it herself. Again I reassured her that she does not have any seizures she is not having stroke and there are no tumor or bleeding in her brain.   1. Somatic symptom disorder     PLAN: Continue your medications  Follow up in 3 months   No orders of the defined types were placed in this encounter.   No orders of the defined types were placed in this encounter.   Return in about 3 months (around 01/16/2021).    Windell Norfolk, MD 10/17/2020, 1:31 PM  [facility] [address] [place], [place] [address] [phone]

## 2020-10-17 NOTE — Patient Instructions (Addendum)
Continue your medications.  Follow up in 3 months.  

## 2020-10-19 NOTE — BH Specialist Note (Signed)
[facility] Follow Up Assessment  MRN: [id number] NAME: Marissa [name] Date: 10/19/20  Start time: 930 End time: 945 Total time: 15  Type of Contact: Follow up Call  Current concerns/stressors: "odd feeling in her legs"  Screens/Assessment Tools:  GAD 7 : Generalized Anxiety Score 10/19/2020 10/03/2020 09/12/2020 08/28/2020  Nervous, Anxious, on Edge 3 3 2 2   Control/stop worrying 3 3 2 1   Worry too much - different things 3 3 2  0  Trouble relaxing 3 2 2 1   Restless 3 2 2  0  Easily annoyed or irritable 2 1 2 1   Afraid - awful might happen 3 3 2 1   Total GAD 7 Score 20 17 14 6   Anxiety Difficulty Very difficult Very difficult Very difficult Somewhat difficult      Functional Assessment:  Sleep: poor Appetite: fair Coping ability: exhausted Patient taking medications as prescribed:  yes  Current medications:  Outpatient Encounter Medications as of 10/16/2020  Medication Sig   acetaminophen (TYLENOL) 500 MG tablet Take 1,000 mg by mouth every 6 (six) hours as needed.   aspirin 81 MG chewable tablet Chew 81 mg by mouth once as needed.   cetirizine (ZYRTEC) 10 MG tablet Take 10 mg by mouth daily.   [DISCONTINUED] atorvastatin (LIPITOR) 40 MG tablet Take 1 tablet (40 mg total) by mouth daily. (Patient not taking: Reported on 09/07/2020)   No facility-administered encounter medications on file as of 10/16/2020.    Self-harm and/or Suicidal Behaviors Risk Assessment Self-harm risk factors: no Patient endorses recent self injurious thoughts and/or behaviors: No   Suicide ideations: No plan to harm self or others   Danger to Others Risk Assessment Danger to others risk factors: no Patient endorses recent thoughts of harming others: No    Substance Use Assessment Patient recently consumed alcohol: No  Patient recently used drugs: No  Patient is concerned about dependence or abuse of substances: No    Goals, Interventions and Follow-up Plan Goals: Increase  healthy adjustment to current life circumstances Interventions: Mindfulness or Relaxation Training   Summary of Clinical Assessment  Marissa [name] is a 40 yr old woman present for follow up.  Marissa [name] reports that she continues to have difficulty with walking, vision and moving her legs at times.  She reports most recently having "an odd sensation" with her legs and they only feel better with touch.  She has upcoming neurological appointments at [facility] this week.  She reports having to go back to work soon at and unsure how she will work in 10/18/2020 with troubles with her legs. Discussion of relaxation techniques to reduce worry. Provided psychoeducation on anxiety and conversion disorder. Patient demonstrated understanding. Will continue with CBT techniques for anxiety   Follow-up Plan:  VBH sessions biweekly 09/09/2020, LCSW

## 2020-10-21 NOTE — Progress Notes (Signed)
See [facility] specialist note dated 08/28/20

## 2020-10-24 ENCOUNTER — Ambulatory Visit: Payer: Medicaid Other | Admitting: Licensed Clinical Social Worker

## 2020-10-24 ENCOUNTER — Telehealth (INDEPENDENT_AMBULATORY_CARE_PROVIDER_SITE_OTHER): Payer: Medicaid Other | Admitting: Licensed Clinical Social Worker

## 2020-10-24 ENCOUNTER — Other Ambulatory Visit: Payer: Self-pay

## 2020-10-24 DIAGNOSIS — F419 Anxiety disorder, unspecified: Secondary | ICD-10-CM

## 2020-10-24 NOTE — BH Specialist Note (Signed)
[facility]al [facility] Treatment Plan Team Note  MRN: [id number] NAME: Marissa [name]  DATE: 10/24/20  Start time: 430p  End time:  435p Total time:  5 min  Total number of Virtual BH Treatment Team Plan encounters: 4/4  Treatment Team Attendees: Nolon Rod, LCSW & Dr. Vanetta Shawl  Diagnoses: No diagnosis found.  Goals, Interventions and Follow-up Plan Goals: Increase healthy adjustment to current life circumstances Interventions: Mindfulness or Relaxation Training Medication Management Recommendations: n/a Follow-up Plan: VBH sessions biweekly  History of the present illness Presenting Problem/Current Symptoms: continued symptoms of DX  Psychiatric History  Depression: No Anxiety: Yes Mania: No Psychosis: No PTSD symptoms: No  Past Psychiatric History/Hospitalization(s): Hospitalization for psychiatric illness: No Prior Suicide Attempts: No Prior Self-injurious behavior: No  Psychosocial stressors neurology  Self-harm Behaviors Risk Assessment   Screenings PHQ-9 Assessments:  Depression screen [facility] 2/9 09/12/2020 09/07/2020 08/30/2020  Decreased Interest 0 0 0  Down, Depressed, Hopeless 0 0 0  PHQ - 2 Score 0 0 0  Altered sleeping 1 0 0  Tired, decreased energy 1 1 1   Change in appetite 1 3 0  Feeling bad or failure about yourself  0 0 0  Trouble concentrating 0 0 0  Moving slowly or fidgety/restless 1 0 0  Suicidal thoughts 0 0 0  PHQ-9 Score 4 4 1   Difficult doing work/chores Not difficult at all Not difficult at all Not difficult at all   GAD-7 Assessments:  GAD 7 : Generalized Anxiety Score 10/19/2020 10/03/2020 09/12/2020 08/28/2020  Nervous, Anxious, on Edge 3 3 2 2   Control/stop worrying 3 3 2 1   Worry too much - different things 3 3 2  0  Trouble relaxing 3 2 2 1   Restless 3 2 2  0  Easily annoyed or irritable 2 1 2 1   Afraid - awful might happen 3 3 2 1   Total GAD 7 Score 20 17 14 6   Anxiety Difficulty Very difficult Very difficult Very  difficult Somewhat difficult    Past Medical History 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) 10/08/2015   R  frontal   Common migraine with intractable migraine 11/08/2015   Encounter for gynecological examination with Papanicolaou smear of cervix 12/30/2018   Hyperlipidemia    Stroke (HCC)    Stroke (HCC)    Vitamin D deficiency     Vital signs: There were no vitals filed for this visit.  Allergies:  Allergies as of 10/24/2020 - Review Complete 10/17/2020  Allergen Reaction Noted   Triptans  11/09/2015   Morphine and related Hives 10/09/2015    Medication History Current medications:  Outpatient Encounter Medications as of 10/24/2020  Medication Sig   acetaminophen (TYLENOL) 500 MG tablet Take 1,000 mg by mouth every 6 (six) hours as needed.   aspirin 81 MG chewable tablet Chew 81 mg by mouth once as needed.   cetirizine (ZYRTEC) 10 MG tablet Take 10 mg by mouth daily.   No facility-administered encounter medications on file as of 10/24/2020.     Scribe for Treatment Team: , LCSW

## 2020-11-07 ENCOUNTER — Ambulatory Visit (INDEPENDENT_AMBULATORY_CARE_PROVIDER_SITE_OTHER): Payer: Medicaid Other | Admitting: Licensed Clinical Social Worker

## 2020-11-07 ENCOUNTER — Other Ambulatory Visit: Payer: Self-pay

## 2020-11-07 DIAGNOSIS — F419 Anxiety disorder, unspecified: Secondary | ICD-10-CM

## 2020-11-07 NOTE — Progress Notes (Signed)
Patient was at work; requested a call back at [phone]

## 2020-11-07 NOTE — BH Specialist Note (Signed)
[facility] Follow Up Assessment  MRN: [id number] NAME: Marissa [name] Date: 11/07/20  Start time: 12 End time: 1215 Total time: 15  Type of Contact: Follow up Call  Current concerns/stressors: medical   Functional Assessment:  Sleep: poor Appetite: poor Coping ability: overwhelmed Patient taking medications as prescribed:    Current medications:  Outpatient Encounter Medications as of 10/24/2020  Medication Sig   acetaminophen (TYLENOL) 500 MG tablet Take 1,000 mg by mouth every 6 (six) hours as needed.   aspirin 81 MG chewable tablet Chew 81 mg by mouth once as needed.   cetirizine (ZYRTEC) 10 MG tablet Take 10 mg by mouth daily.   No facility-administered encounter medications on file as of 10/24/2020.    Self-harm and/or Suicidal Behaviors Risk Assessment Self-harm risk factors: no Patient endorses recent self injurious thoughts and/or behaviors: No   Suicide ideations: No plan to harm self or others   Danger to Others Risk Assessment Danger to others risk factors: no Patient endorses recent thoughts of harming others: No    Substance Use Assessment Patient recently consumed alcohol: No  Patient recently used drugs: No  Patient is concerned about dependence or abuse of substances: No    Goals, Interventions and Follow-up Plan Goals: Increase healthy adjustment to current life circumstances Interventions: Mindfulness or Relaxation Training   Summary of Clinical Assessment  Marissa [name] is a 40 yr old woman who was present for follow up.  She was open to discussing her current mood, riggers and stressors.   Follow-up Plan:  biweekly vbh sessions [name], LCSW

## 2020-11-29 ENCOUNTER — Ambulatory Visit: Payer: Medicaid Other | Admitting: Licensed Clinical Social Worker

## 2020-11-29 ENCOUNTER — Other Ambulatory Visit: Payer: Self-pay

## 2020-12-05 ENCOUNTER — Other Ambulatory Visit: Payer: Self-pay

## 2020-12-05 ENCOUNTER — Ambulatory Visit (HOSPITAL_COMMUNITY)
Admission: RE | Admit: 2020-12-05 | Discharge: 2020-12-05 | Disposition: A | Discharge status: Home / Self Care | Payer: Medicaid Other | Source: Ambulatory Visit | Attending: Family Medicine | Admitting: Family Medicine

## 2020-12-05 ENCOUNTER — Ambulatory Visit (INDEPENDENT_AMBULATORY_CARE_PROVIDER_SITE_OTHER): Payer: Medicaid Other | Admitting: Family Medicine

## 2020-12-05 ENCOUNTER — Encounter: Payer: Self-pay | Admitting: Family Medicine

## 2020-12-05 VITALS — BP 121/78 | HR 71 | Resp 17 | Ht 62.0 in | Wt 179.1 lb

## 2020-12-05 DIAGNOSIS — G988 Other disorders of nervous system: Secondary | ICD-10-CM

## 2020-12-05 DIAGNOSIS — M545 Low back pain, unspecified: Secondary | ICD-10-CM | POA: Diagnosis not present

## 2020-12-05 DIAGNOSIS — M5410 Radiculopathy, site unspecified: Secondary | ICD-10-CM

## 2020-12-05 DIAGNOSIS — M419 Scoliosis, unspecified: Secondary | ICD-10-CM | POA: Diagnosis not present

## 2020-12-05 DIAGNOSIS — M5136 Other intervertebral disc degeneration, lumbar region: Secondary | ICD-10-CM | POA: Diagnosis not present

## 2020-12-05 DIAGNOSIS — M47814 Spondylosis without myelopathy or radiculopathy, thoracic region: Secondary | ICD-10-CM | POA: Diagnosis not present

## 2020-12-05 IMAGING — DX DG LUMBAR SPINE COMPLETE 4+V
5 series · 5 of 5 positions shown · non-contrast
Comparison: None.

CLINICAL DATA: Low back pain without history of injury.

EXAM:
LUMBAR SPINE - COMPLETE 4+ VIEW

[l-spine ap]
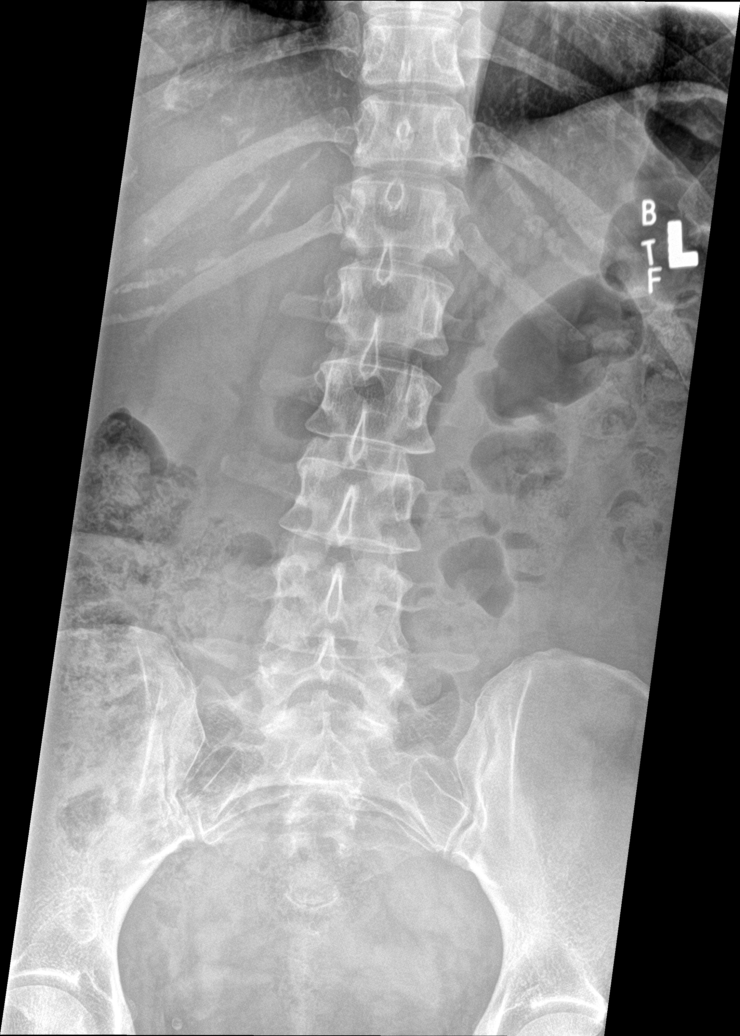

[l-spine obl (1 of 2)]
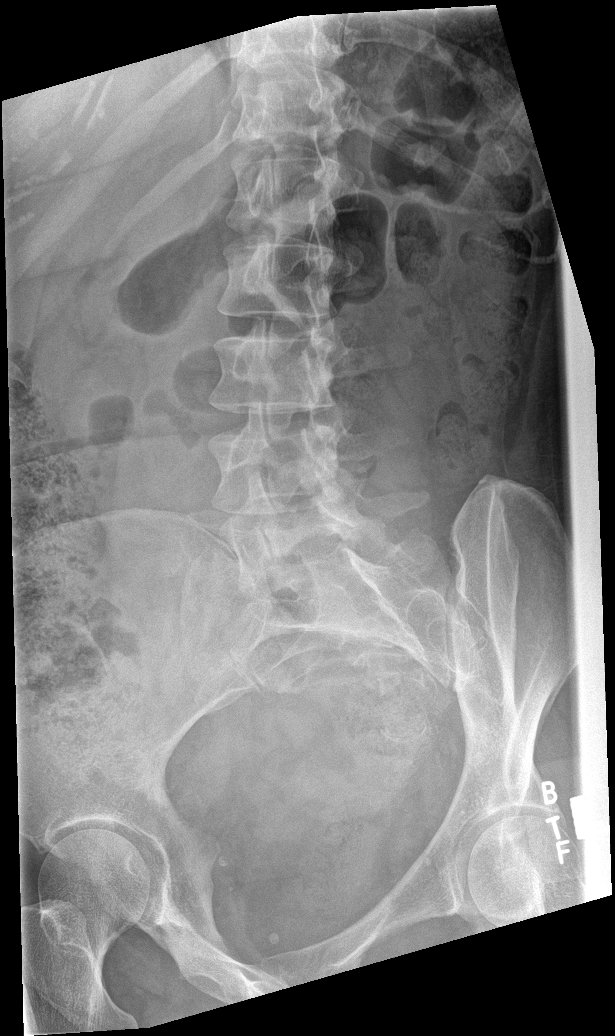

[l-spine obl (2 of 2)]
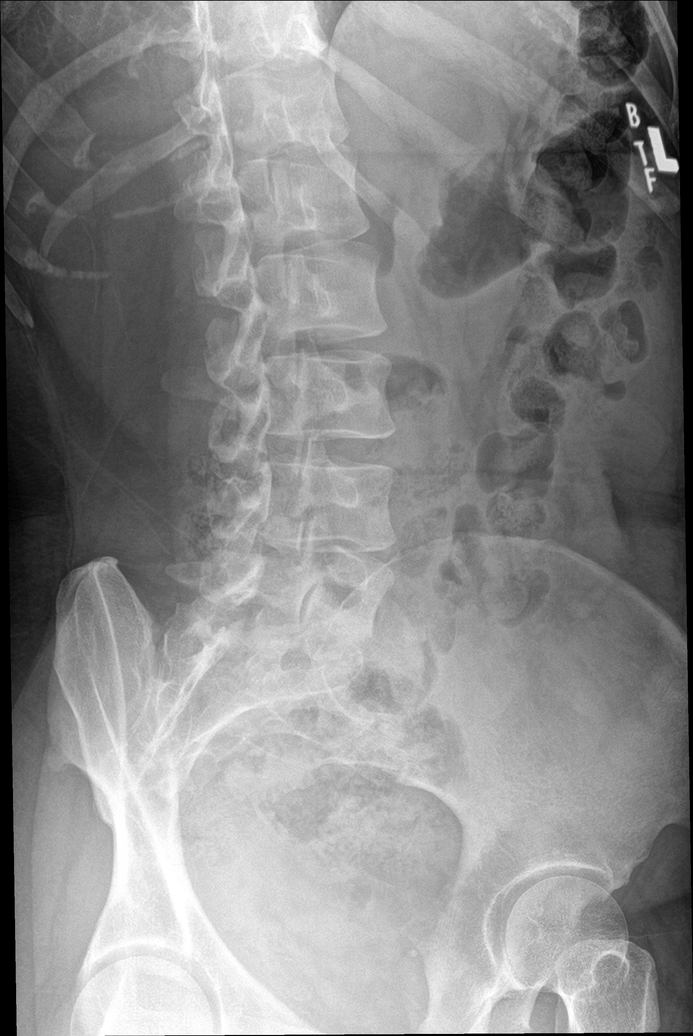

[l-spine lat]
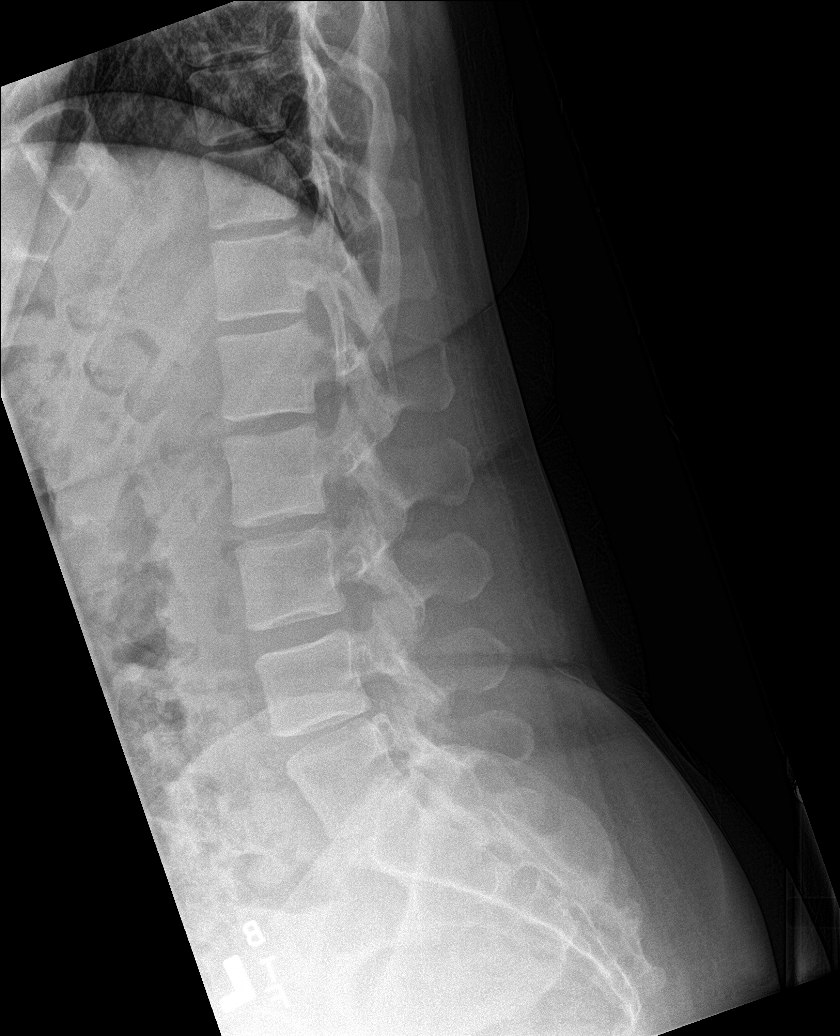

[l-spine spot]
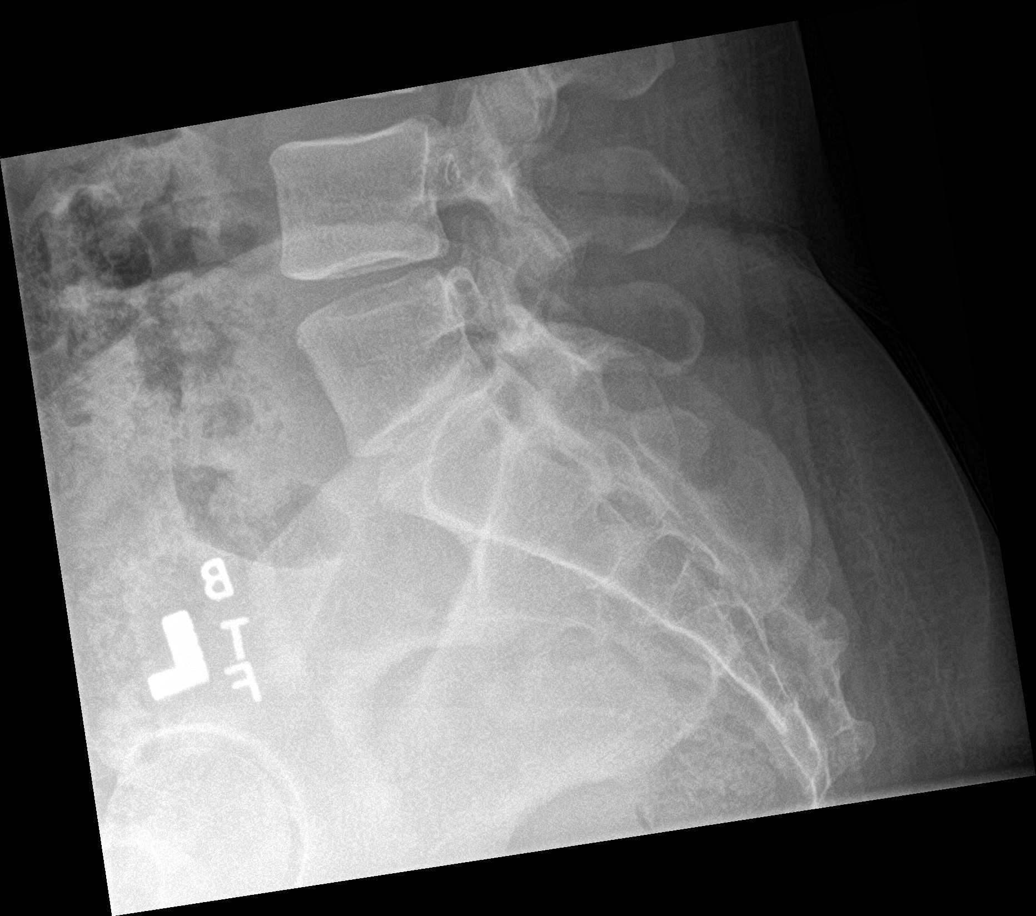

[5 of 5 positions shown; findings below may reference images not displayed]

FINDINGS: There is no evidence of lumbar spine fracture. Alignment is normal.
There is normal bone mineralization.

Mild disc space loss is present at L4-5 and L5-S1, with otherwise
normal disc heights. There is mild facet spurring from L3-4
inferiorly. No findings of significant spondylosis.

The SI joints are unremarkable, as visualized. The visualized pelvis
is intact.
IMPRESSION: Degenerative changes, without evidence of fractures or malalignment.

## 2020-12-05 IMAGING — DX DG THORACIC SPINE 3V
3 series · 3 of 3 positions shown · non-contrast
Comparison: Chest PA Lat [DATE].

CLINICAL DATA: Back pain without history of injury

EXAM:
THORACIC SPINE - 3 VIEWS

[t-spine ap]
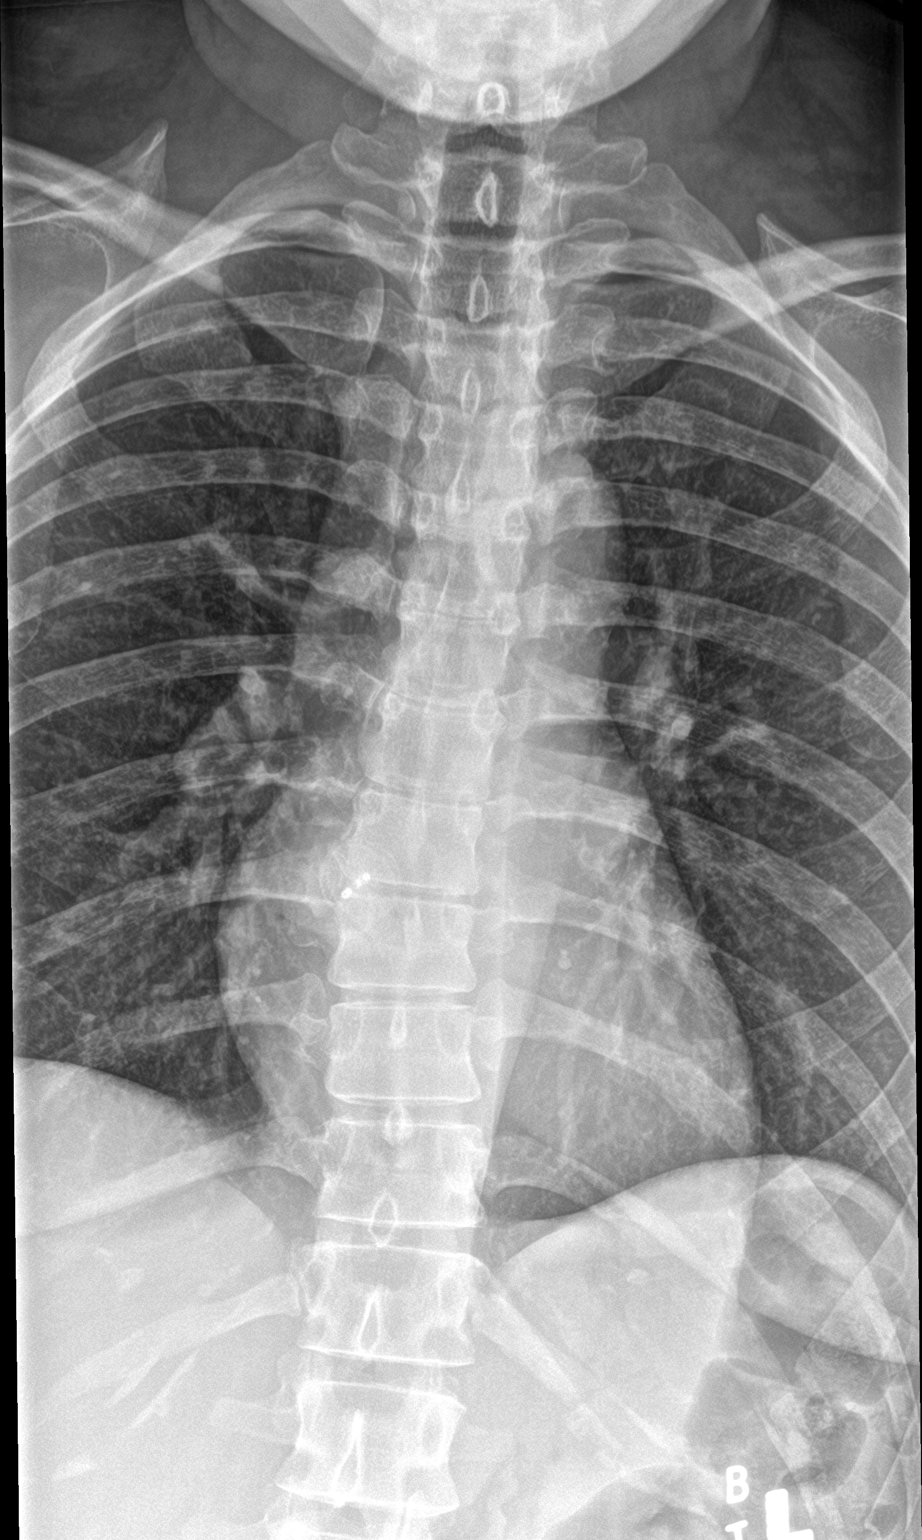

[t-spine lat]
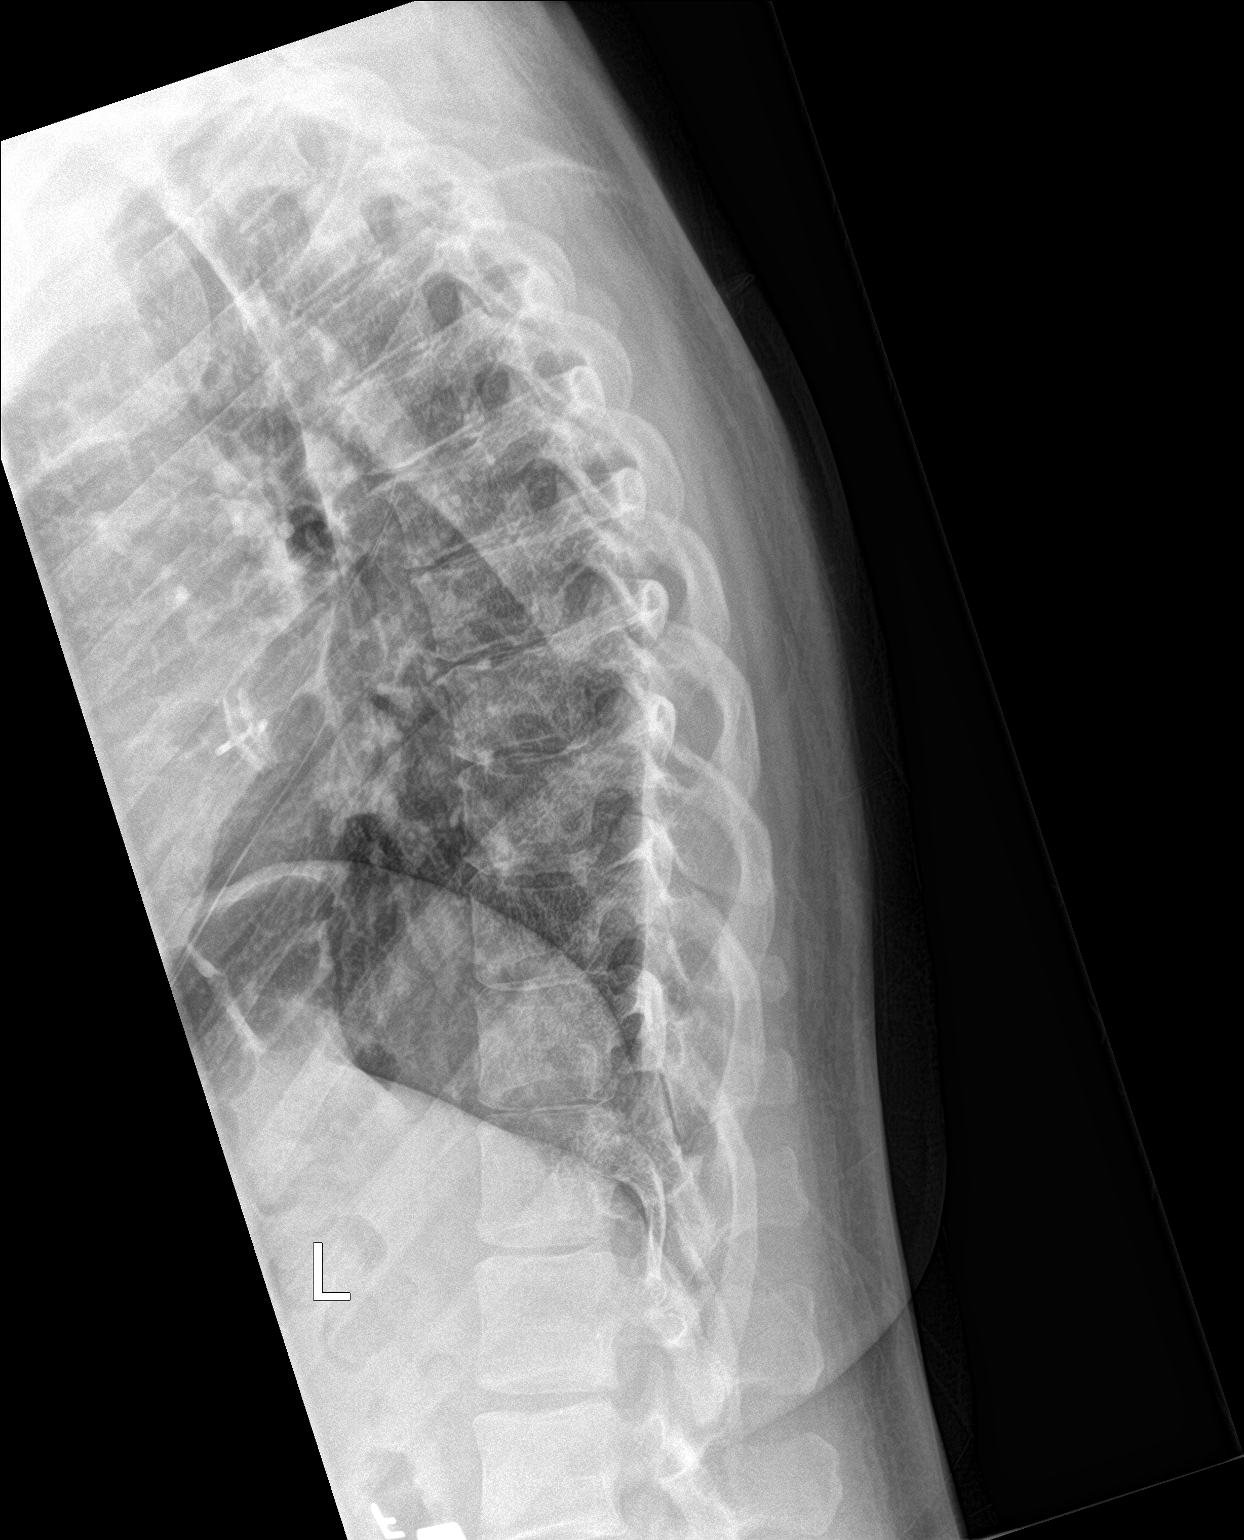

[t-spine swimmers]
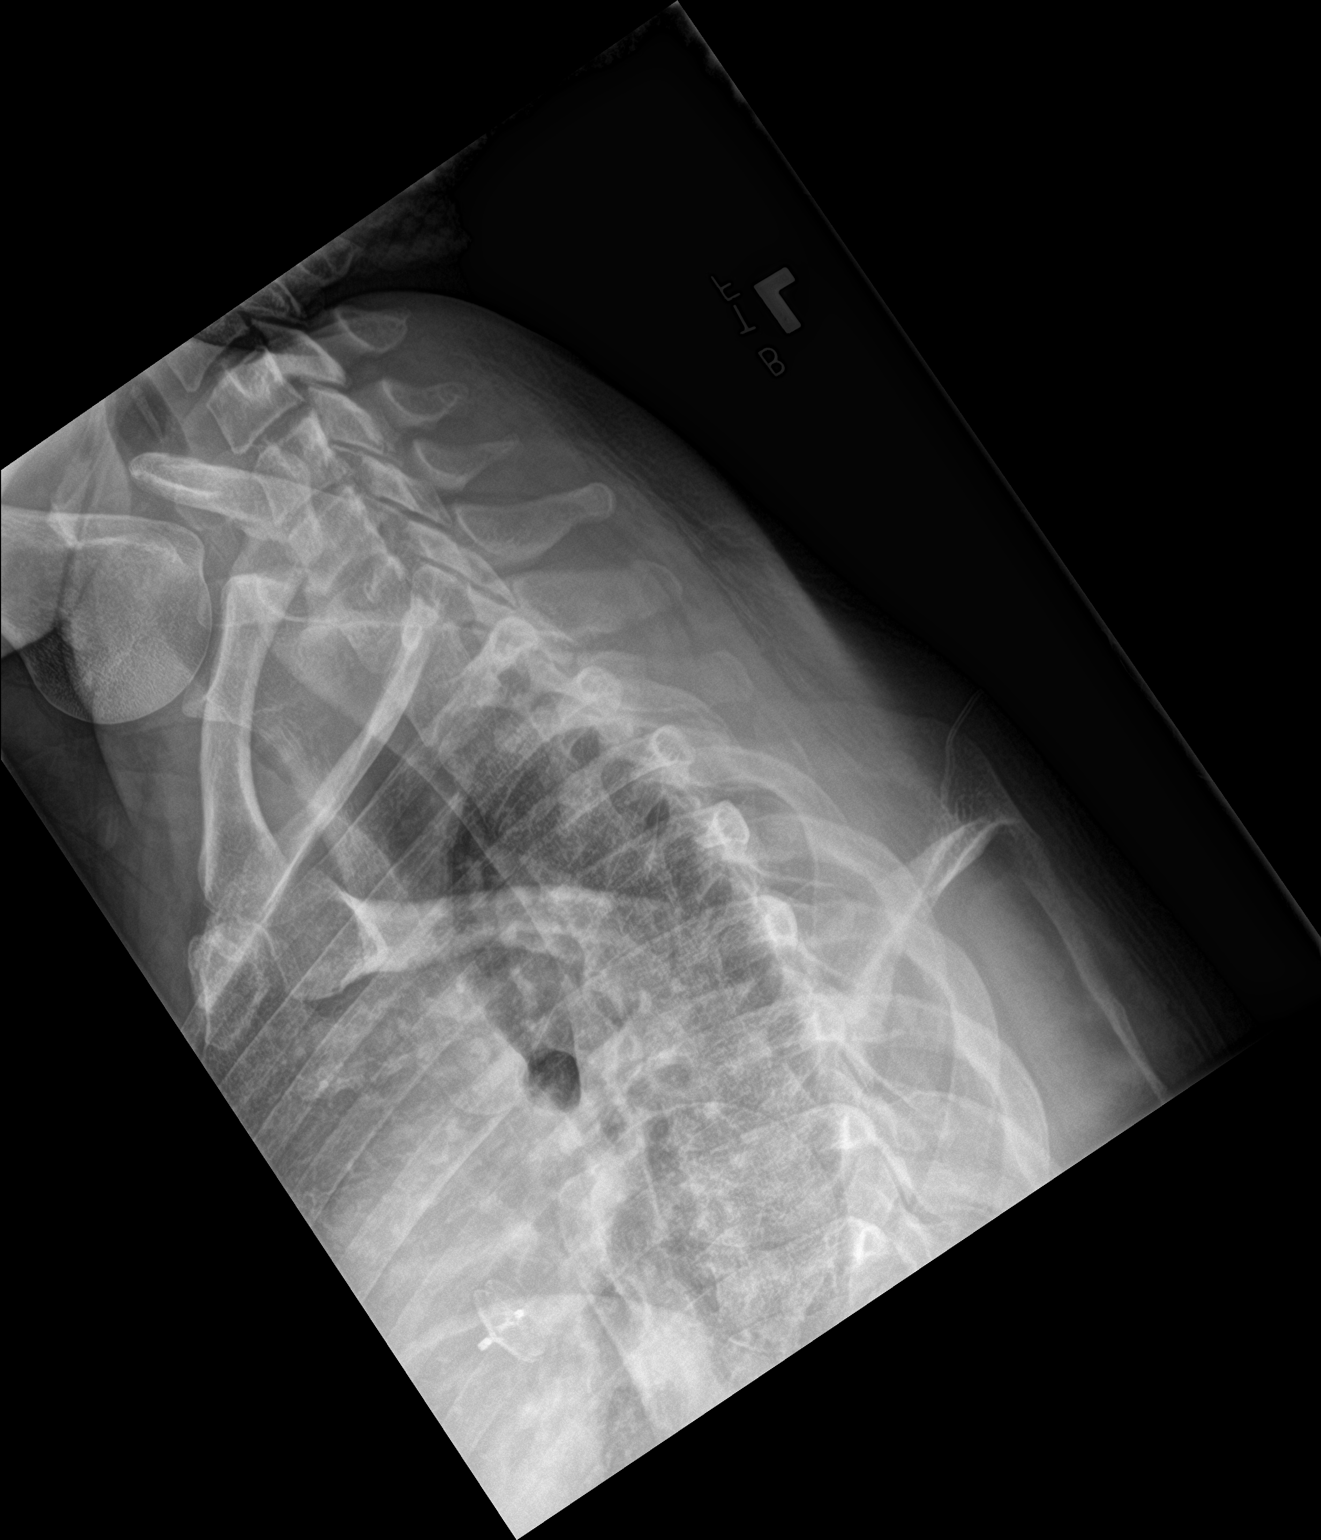

[3 of 3 positions shown; findings below may reference images not displayed]

FINDINGS: There is normal bone mineralization without evidence of fractures.
There is a slight mid to lower thoracic dextroscoliosis without AP
listhesis.

The vertebra and discs are normal in heights, with mild features of
spondylosis in the upper to mid T-spine. PFO closure device is
stable in positioning.
IMPRESSION: Degenerative changes and slight dextroscoliosis. No acute
radiographic findings. Unchanged.

## 2020-12-05 MED ORDER — PREDNISONE 5 MG (21) PO TBPK
5.0000 mg | ORAL_TABLET | ORAL | 0 refills | Status: DC
Start: 2020-12-05 — End: 2021-02-13

## 2020-12-05 NOTE — Patient Instructions (Addendum)
F/U in January , call if you need to be seen  sooner  Prednisone dose pack  is prescribed  I will review and see if Neurology can see you sooner Thanks for choosing [facility], we consider it a privelige to serve you. Be careful NOT to  fall!

## 2020-12-07 ENCOUNTER — Encounter: Payer: Self-pay | Admitting: Family Medicine

## 2020-12-12 ENCOUNTER — Encounter: Payer: Self-pay | Admitting: Family Medicine

## 2020-12-12 DIAGNOSIS — M5410 Radiculopathy, site unspecified: Secondary | ICD-10-CM | POA: Insufficient documentation

## 2020-12-12 HISTORY — DX: Radiculopathy, site unspecified: M54.10

## 2020-12-12 NOTE — Assessment & Plan Note (Signed)
Short course of prednisone, x ray of mid and lower back, contine to follow up with Neurology

## 2020-12-12 NOTE — Assessment & Plan Note (Signed)
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

## 2020-12-12 NOTE — Progress Notes (Signed)
   Marissa [name]     MRN: [id number]      DOB: 07-23-1980   HPI Marissa [name] is here with /o low back pain which started on 11/10, initially on right side , now extends across lower back and she also has some right leg aching at times C/o lower extremity weakness and tremor, currently being followed by Neurology ROS Denies recent fever or chills. Denies sinus pressure, nasal congestion, ear pain or sore throat. Denies chest congestion, productive cough or wheezing. Denies chest pains, palpitations and leg swelling Denies abdominal pain, nausea, vomiting,diarrhea or constipation.   Denies dysuria, frequency, hesitancy or incontinence.  Denies headaches, seizures, numbness, or tingling. Denies uncontrolled depression, anxiety or insomnia. Denies skin break down or rash.   PE  BP 121/78   Pulse 71   Resp 17   Ht 5\' 2"  (1.575 m)   Wt 179 lb 1.9 oz (81.2 kg)   LMP 11/21/2020   SpO2 95%   BMI 32.76 kg/m   Patient alert and oriented and in no cardiopulmonary distress.  HEENT: No facial asymmetry, EOMI,     Neck supple .  Chest: Clear to auscultation bilaterally.  CVS: S1, S2 no murmurs, no S3.Regular rate.  ABD: Soft non tender.   Ext: No edema  MS: Adequate ROM spine, shoulders, hips and knees.  Skin: Intact, no ulcerations or rash noted.  Psych: Good eye contact, normal affect. Memory intact not anxious or depressed appearing.  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

## 2020-12-27 ENCOUNTER — Other Ambulatory Visit: Payer: Self-pay

## 2020-12-27 ENCOUNTER — Ambulatory Visit (INDEPENDENT_AMBULATORY_CARE_PROVIDER_SITE_OTHER): Payer: Medicaid Other | Admitting: Licensed Clinical Social Worker

## 2020-12-27 DIAGNOSIS — F449 Dissociative and conversion disorder, unspecified: Secondary | ICD-10-CM

## 2021-01-06 ENCOUNTER — Ambulatory Visit
Admission: EM | Admit: 2021-01-06 | Discharge: 2021-01-06 | Disposition: A | Discharge status: Home / Self Care | Payer: Medicaid Other | Attending: Family Medicine | Admitting: Family Medicine

## 2021-01-06 ENCOUNTER — Encounter: Payer: Self-pay | Admitting: *Deleted

## 2021-01-06 ENCOUNTER — Other Ambulatory Visit: Payer: Self-pay

## 2021-01-06 DIAGNOSIS — J3089 Other allergic rhinitis: Secondary | ICD-10-CM | POA: Diagnosis not present

## 2021-01-06 DIAGNOSIS — R051 Acute cough: Secondary | ICD-10-CM | POA: Diagnosis not present

## 2021-01-06 DIAGNOSIS — J069 Acute upper respiratory infection, unspecified: Secondary | ICD-10-CM | POA: Diagnosis not present

## 2021-01-06 MED ORDER — PROMETHAZINE-DM 6.25-15 MG/5ML PO SYRP: 5.0000 mL | ORAL_SOLUTION | Freq: Four times a day (QID) | ORAL | 0 refills | Status: DC | PRN

## 2021-01-06 NOTE — ED Triage Notes (Signed)
C/O cough, sore throat, runny nose, chills.  States started with scratchy throat onset 6 days ago which has improved, but now c/o "choking cough", runny nose, chills.  Also c/o watery right eye. Denies any known fevers.

## 2021-01-06 NOTE — ED Provider Notes (Signed)
[facility]    CSN: [id number] Arrival date & time: 01/06/21  0831     History   Chief Complaint Chief Complaint  Patient presents with   Cough   HPI Marissa [name] is a 40 y.o. female.   Presenting today with 6 days of waxing and waning cough, sore throat, runny nose, sneezing, watery eyes.  Denies fever, chills, body aches, chest pain, shortness of breath, abdominal pain, nausea vomiting or diarrhea.  So far taking DayQuil with mild temporary relief of symptoms.  Son is been sick as well.  Does have a history of seasonal allergies not currently taking any antihistamines.   Past Medical History:  Diagnosis Date   Allergy    seasonal   Anxiety    Back pain with right-sided radiculopathy 12/12/2020   Cerebrovascular accident (CVA) due to embolism (HCC) 10/08/2015   R  frontal   Common migraine with intractable migraine 11/08/2015   Encounter for gynecological examination with Papanicolaou smear of cervix 12/30/2018   Hyperlipidemia    Stroke [facility])    Vitamin D deficiency     Patient Active Problem List   Diagnosis Date Noted   Back pain with right-sided radiculopathy 12/12/2020   Neurological disorder 09/07/2020   Speech disturbance 09/03/2020   Spell of abnormal behavior    Abnormal MRI of head    TIA (transient ischemic attack) 08/13/2020   History of CVA (cerebrovascular accident) 12/09/2018   Anxiety 12/09/2018   Obesity (BMI 30.0-34.9) 12/09/2018   Common migraine with intractable migraine 11/08/2015   PFO (patent foramen ovale) 10/08/2015   HLD (hyperlipidemia) 10/08/2015   Vitamin D deficiency 10/08/2015    Past Surgical History:  Procedure Laterality Date   CESAREAN SECTION     PATENT FORAMEN OVALE CLOSURE  09/25/2015   TUBAL LIGATION      OB History     Gravida  3   Para  3   Term  2   Preterm  1   AB      Living  3      SAB      IAB      Ectopic      Multiple      Live Births               Home  Medications    Prior to Admission medications   Medication Sig Start Date End Date Taking? Authorizing Provider  promethazine-dextromethorphan (PROMETHAZINE-DM) 6.25-15 MG/5ML syrup Take 5 mLs by mouth 4 (four) times daily as needed. 01/06/21  Yes Particia Nearing, PA-C  acetaminophen (TYLENOL) 500 MG tablet Take 1,000 mg by mouth every 6 (six) hours as needed.    [provider]  aspirin 81 MG chewable tablet Chew 81 mg by mouth once as needed. Patient not taking: Reported on 12/05/2020    [provider]  cetirizine (ZYRTEC) 10 MG tablet Take 10 mg by mouth daily.    [provider]  predniSONE (STERAPRED UNI-PAK 21 TAB) 5 MG (21) TBPK tablet Take 1 tablet (5 mg total) by mouth as directed. Use as directed 12/05/20   Kerri Perches, MD    Family History Family History  Problem Relation Age of Onset   Hypertension Mother    Heart disease Maternal Grandmother    COPD Maternal Grandfather    Cancer Maternal Grandfather        lung   Heart disease Maternal Aunt        murmur   Heart  disease Maternal Uncle        murmur   Migraines Neg Hx     Social History Social History   Tobacco Use   Smoking status: Never   Smokeless tobacco: Never  Vaping Use   Vaping Use: Never used  Substance Use Topics   Alcohol use: No   Drug use: No     Allergies   Triptans and Morphine and related   Review of Systems Review of Systems Per HPI  Physical Exam Triage Vital Signs ED Triage Vitals  Enc Vitals Group     BP 01/06/21 0937 115/73     Pulse Rate 01/06/21 0937 67     Resp 01/06/21 0937 16     Temp 01/06/21 0937 (!) 97.2 F (36.2 C)     Temp Source 01/06/21 0937 Temporal     SpO2 01/06/21 0937 98 %     Weight --      Height --      Head Circumference --      Peak Flow --      Pain Score 01/06/21 0939 2     Pain Loc --      Pain Edu? --      Excl. in GC? --    No data found.  Updated Vital Signs BP 115/73    Pulse 67    Temp (!)  97.2 F (36.2 C) (Temporal)    Resp 16    LMP 12/23/2020 (Approximate)    SpO2 98%   Visual Acuity Right Eye Distance:   Left Eye Distance:   Bilateral Distance:    Right Eye Near:   Left Eye Near:    Bilateral Near:     Physical Exam Vitals and nursing note reviewed.  Constitutional:      Appearance: Normal appearance.  HENT:     Head: Atraumatic.     Right Ear: Tympanic membrane and external ear normal.     Left Ear: Tympanic membrane and external ear normal.     Nose: Rhinorrhea present.     Mouth/Throat:     Mouth: Mucous membranes are moist.     Pharynx: Posterior oropharyngeal erythema present.  Eyes:     Extraocular Movements: Extraocular movements intact.     Conjunctiva/sclera: Conjunctivae normal.  Cardiovascular:     Rate and Rhythm: Normal rate and regular rhythm.     Heart sounds: Normal heart sounds.  Pulmonary:     Effort: Pulmonary effort is normal.     Breath sounds: Normal breath sounds. No wheezing or rales.  Abdominal:     General: Bowel sounds are normal. There is no distension.     Palpations: Abdomen is soft.     Tenderness: There is no abdominal tenderness. There is no guarding.  Musculoskeletal:        General: Normal range of motion.     Cervical back: Normal range of motion and neck supple.  Skin:    General: Skin is warm and dry.  Neurological:     Mental Status: She is alert and oriented to person, place, and time.  Psychiatric:        Mood and Affect: Mood normal.        Thought Content: Thought content normal.     UC Treatments / Results  Labs (all labs ordered are listed, but only abnormal results are displayed) Labs Reviewed  COVID-19, FLU A+B NAA    EKG   Radiology No results found.  Procedures Procedures (including critical  care time)  Medications Ordered in UC Medications - No data to display  Initial Impression / Assessment and Plan / UC Course  I have reviewed the triage vital signs and the nursing  notes.  Pertinent labs & imaging results that were available during my care of the patient were reviewed by me and considered in my medical decision making (see chart for details).     Vitals and exam overall reassuring, COVID and flu testing pending for rule out but suspect symptoms more related to uncontrolled allergies.  Restart Zyrtec and Flonase regimen, will give Phenergan DM for cough.  Supportive over-the-counter medications and home care reviewed.  Return for worsening symptoms.  Final Clinical Impressions(s) / UC Diagnoses   Final diagnoses:  Acute cough  Viral URI with cough  Seasonal allergic rhinitis due to other allergic trigger   Discharge Instructions   None    ED Prescriptions     Medication Sig Dispense Auth. Provider   promethazine-dextromethorphan (PROMETHAZINE-DM) 6.25-15 MG/5ML syrup Take 5 mLs by mouth 4 (four) times daily as needed. 100 mL Particia Nearing, [place]      PDMP not reviewed this encounter.   Particia Nearing, [place] 01/06/21 1034

## 2021-01-07 LAB — COVID-19, FLU A+B NAA
Influenza A, NAA: NOT DETECTED
Influenza B, NAA: NOT DETECTED
SARS-CoV-2, NAA: NOT DETECTED

## 2021-01-16 ENCOUNTER — Ambulatory Visit: Payer: Medicaid Other | Admitting: Neurology

## 2021-01-17 ENCOUNTER — Encounter: Payer: Self-pay | Admitting: Neurology

## 2021-01-25 NOTE — BH Specialist Note (Signed)
Reschedule; provider error

## 2021-02-08 ENCOUNTER — Ambulatory Visit: Payer: Medicaid Other | Admitting: Family Medicine

## 2021-02-13 ENCOUNTER — Ambulatory Visit: Payer: Medicaid Other | Admitting: Family Medicine

## 2021-02-13 ENCOUNTER — Other Ambulatory Visit: Payer: Self-pay

## 2021-02-13 ENCOUNTER — Encounter: Payer: Self-pay | Admitting: Family Medicine

## 2021-02-13 VITALS — BP 124/84 | HR 86 | Ht 62.0 in | Wt 179.1 lb

## 2021-02-13 DIAGNOSIS — Z599 Problem related to housing and economic circumstances, unspecified: Secondary | ICD-10-CM | POA: Diagnosis not present

## 2021-02-13 DIAGNOSIS — F451 Undifferentiated somatoform disorder: Secondary | ICD-10-CM

## 2021-02-13 DIAGNOSIS — M5410 Radiculopathy, site unspecified: Secondary | ICD-10-CM | POA: Diagnosis not present

## 2021-02-13 DIAGNOSIS — Z8673 Personal history of transient ischemic attack (TIA), and cerebral infarction without residual deficits: Secondary | ICD-10-CM

## 2021-02-13 DIAGNOSIS — E669 Obesity, unspecified: Secondary | ICD-10-CM

## 2021-02-13 DIAGNOSIS — Z6379 Other stressful life events affecting family and household: Secondary | ICD-10-CM

## 2021-02-13 DIAGNOSIS — E785 Hyperlipidemia, unspecified: Secondary | ICD-10-CM | POA: Diagnosis not present

## 2021-02-13 DIAGNOSIS — G43019 Migraine without aura, intractable, without status migrainosus: Secondary | ICD-10-CM

## 2021-02-13 DIAGNOSIS — E66811 Obesity, class 1: Secondary | ICD-10-CM

## 2021-02-13 MED ORDER — BUTALBITAL-APAP-CAFFEINE 50-325-40 MG PO TABS: ORAL_TABLET | ORAL | 0 refills | Status: DC

## 2021-02-13 NOTE — Patient Instructions (Signed)
F/U with Dr Lodema Hong, reschedule from 2/2 ( with Mitzi Davenport) to   mid to end April, call if you need me before  New medication for  headache is fioricet if needed  Please reschedule Neurology follow up , this is very important  With a dx of TIA , I do recommend the low dose aspirin  Thanks for choosing [facility], we consider it a privelige to serve you.

## 2021-02-13 NOTE — Progress Notes (Signed)
° °  Marissa [name]     MRN: [id number]      DOB: [birth year]   HPI Marissa [name] is here with a 3 day h/o pounding headache , frontal and bitemporal , sometimes at nape, started a t a 10 on Monday, down to 7 on Tuesday, now a 2, no real response to 6 tylenol taken Was concerned about fluctuating blood pressure, no systolic over 140 noted ROS Denies recent fever or chills. Denies sinus pressure, nasal congestion, ear pain or sore throat. Denies chest congestion, productive cough or wheezing. Denies chest pains, palpitations and leg swelling Denies abdominal pain, nausea, vomiting,diarrhea or constipation.   Denies dysuria, frequency, hesitancy or incontinence. Chronic lowe extremity pain and weakness, mised recent Neurology appt and needs to reschedule. . Denies depression, anxiety or insomnia. Denies skin break down or rash.   PE  BP 124/84    Pulse 86    Ht 5\' 2"  (1.575 m)    Wt 179 lb 1.9 oz (81.2 kg)    LMP 02/13/2021 (Exact Date)    SpO2 98%    BMI 32.76 kg/m   Patient alert and oriented and in no cardiopulmonary distress.  HEENT: No facial asymmetry, EOMI,     Neck supple .  Chest: Clear to auscultation bilaterally.  CVS: S1, S2 no murmurs, no S3.Regular rate.  ABD: Soft non tender.   Ext: No edema  MS: Adequate ROM spine, shoulders, hips and knees.  Skin: Intact, no ulcerations or rash noted.  Psych: Good eye contact, normal affect. Memory intact not anxious or depressed appearing.  CNS: CN 2-12 intact, power,  normal throughout.no focal deficits noted.   Assessment & Plan  Common migraine with intractable migraine fioricet prescribed , advised if headache persists or worsens , to call back,needs to resc=hedule Neurology appt  Back pain with right-sided radiculopathy Needs to reschedule neurology appt  History of CVA (cerebrovascular accident) Advised to stay on low dose aspiin  Obesity (BMI 30.0-34.9)  Patient re-educated about  the importance of  commitment to a  minimum of 150 minutes of exercise per week as able.  The importance of healthy food choices with portion control discussed, as well as eating regularly and within a 12 hour window most days. The need to choose "clean , green" food 50 to 75% of the time is discussed, as well as to make water the primary drink and set a goal of 64 ounces water daily.    Weight /BMI 02/13/2021 12/05/2020 10/17/2020  WEIGHT 179 lb 1.9 oz 179 lb 1.9 oz 177 lb  HEIGHT 5\' 2"  5\' 2"  5\' 2"   BMI 32.76 kg/m2 32.76 kg/m2 32.37 kg/m2      HLD (hyperlipidemia) Hyperlipidemia:Low fat diet discussed and encouraged.   Lipid Panel  Lab Results  Component Value Date   CHOL 161 08/30/2020   HDL 69 08/30/2020   LDLCALC 83 08/30/2020   TRIG 40 08/30/2020   CHOLHDL 2.4 08/13/2020   Normal

## 2021-02-18 ENCOUNTER — Telehealth: Payer: Self-pay | Admitting: Family Medicine

## 2021-02-18 NOTE — Telephone Encounter (Signed)
..  Patient declines further follow up and engagement by the Managed Medicaid Team. Appropriate care team members and provider have been notified via electronic communication. The Managed Medicaid Team is available to follow up with the patient after provider conversation with the patient regarding recommendation for engagement and subsequent re-referral to the Managed Medicaid Team.    Jennifer Alley Care Guide, High Risk Medicaid Managed Care Embedded Care Coordination [place]  [facility]   

## 2021-02-21 ENCOUNTER — Ambulatory Visit: Payer: Medicaid Other | Admitting: Nurse Practitioner

## 2021-02-24 NOTE — Assessment & Plan Note (Signed)
Hyperlipidemia:Low fat diet discussed and encouraged.   Lipid Panel  Lab Results  Component Value Date   CHOL 161 08/30/2020   HDL 69 08/30/2020   LDLCALC 83 08/30/2020   TRIG 40 08/30/2020   CHOLHDL 2.4 08/13/2020   Normal

## 2021-02-24 NOTE — Assessment & Plan Note (Signed)
Advised to stay on low dose aspiin

## 2021-02-24 NOTE — Assessment & Plan Note (Signed)
°  Patient re-educated about  the importance of commitment to a  minimum of 150 minutes of exercise per week as able.  The importance of healthy food choices with portion control discussed, as well as eating regularly and within a 12 hour window most days. The need to choose "clean , green" food 50 to 75% of the time is discussed, as well as to make water the primary drink and set a goal of 64 ounces water daily.    Weight /BMI 02/13/2021 12/05/2020 10/17/2020  WEIGHT 179 lb 1.9 oz 179 lb 1.9 oz 177 lb  HEIGHT 5\' 2"  5\' 2"  5\' 2"   BMI 32.76 kg/m2 32.76 kg/m2 32.37 kg/m2

## 2021-02-24 NOTE — Assessment & Plan Note (Signed)
Needs to reschedule neurology appt

## 2021-02-24 NOTE — Assessment & Plan Note (Signed)
fioricet prescribed , advised if headache persists or worsens , to call back,needs to resc=hedule Neurology appt

## 2021-03-07 ENCOUNTER — Ambulatory Visit: Payer: Medicaid Other | Admitting: Nurse Practitioner

## 2021-03-13 ENCOUNTER — Ambulatory Visit: Payer: Medicaid Other | Admitting: Neurology

## 2021-03-13 ENCOUNTER — Encounter: Payer: Self-pay | Admitting: Neurology

## 2021-03-13 VITALS — BP 124/81 | HR 70 | Ht 62.0 in | Wt 178.0 lb

## 2021-03-13 DIAGNOSIS — F451 Undifferentiated somatoform disorder: Secondary | ICD-10-CM

## 2021-03-13 DIAGNOSIS — G44209 Tension-type headache, unspecified, not intractable: Secondary | ICD-10-CM

## 2021-03-13 DIAGNOSIS — F439 Reaction to severe stress, unspecified: Secondary | ICD-10-CM

## 2021-03-13 MED ORDER — LAMOTRIGINE 25 MG PO TABS: ORAL_TABLET | ORAL | 0 refills | Status: DC

## 2021-03-13 NOTE — Progress Notes (Signed)
[facility]  PATIENT: Marissa [name] DOB: [birth year]  REFERRING CLINICIAN: Heather Roberts, NP HISTORY FROM: Patient and mother  REASON FOR VISIT: Difficulty walking/Dizziness/Abnormal Eye movements/   HISTORICAL  CHIEF COMPLAINT:  Chief Complaint  Patient presents with   Follow-up    Rm 12. Accompanied by mother. Pt c/o difficulty walking and severe headaches. States headaches occur 1-2 times per week. Denies any known headache triggers.    INTERVAL HISTORY 03/13/21:  Patient presents today for follow-up, she is accompanied by her mother.  Since last visit she report doing okay.  She still have episodes of difficulty standing, initial difficulty initiating steps but no falls.  She reports a couple weeks ago while at work she had another sudden onset of dizziness, feel like she needed to sit down, she sat but was not able to speak but understood everything that was going around her.  Symptoms subsided after a few moments been again she reports some difficulty standing.   She does report headaches, once or twice a week, she follow-up with her primary care doctor who started her on Fioricet.  Patient reports Fioricet helped with the headaches, after taking the medication, the headaches subsided in about 30 minutes.  She reported one severe episode of headache associated with nausea but no vomiting.  Otherwise she does not have vomiting, photophobia or phonophobia associated with the headaches.   In terms of her current stress, patient reported he is stable, husband is still dealing with health issues, currently he is wearing a heart monitor.  Reported her kids are fine.  She has not seen a psychiatrist or therapist for stress management.  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   HISTORY OF PRESENT ILLNESS:  This is a 41 year old woman with past medical history of stroke in 2017,  migraine and vitamin D deficiency who is presenting with multiple complaints of difficulty walking, weakness, dizziness, abnormal eye movements.  Patient states stated that symptoms started on July 24 while driving she started feeling dizzy and losing sense of direction.  She talked to her husband who told her to move on the side of the road and he helped her get out of the car.  Patient stated she did not remember much of the event, she remembered being home walking up to the couch, started having right arm tremor and she remember people talking to her but she could not talk she could not walk.  On August 12 she had blood work done.  Then her legs start feeling weak and wobbly she remembers falling at home and could not walk.  Since August 12 she have problems with weakness in her legs, problem with balance, she also have episodes of uncontrolled eye blinking. During this time, she is aware of her surroundings but she is unable to speak.  This episode lasted 15 to 20 seconds.  During this time she had multiple ED admission-MRIs of the brain which was negative for any acute intracranial abnormality.  She has also seen multiple neurologists including a recent visit to [facility].  At [facility] she did have a repeat CTA we did not show any evidence of aneurysm and she had a 24-hour ambulatory EEG in which for an episode of eye blinking were captured without EEG correlate those events were deemed to be nonepileptic.  On further note patient report a lot of stressors from  home she is married with 3 kids.  Her husband is currently not working and trying to get disability, he has a diagnosis of heart failure with decrease ejection fraction.  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  There is also stress related to her children at school.    OTHER MEDICAL CONDITIONS: Stroke 2017, Migraines, Vit D deficiency    REVIEW OF SYSTEMS: Full  14 system review of systems performed and negative with exception of: as noted in the HPI.   ALLERGIES: Allergies  Allergen Reactions   Triptans     Cannot use triptans for migraine per neurology due to stroke history   Morphine And Related Hives    Feels like skin is burning     HOME MEDICATIONS: Outpatient Medications Prior to Visit  Medication Sig Dispense Refill   acetaminophen (TYLENOL) 500 MG tablet Take 1,000 mg by mouth every 6 (six) hours as needed.     aspirin 81 MG chewable tablet Chew 81 mg by mouth once as needed.     butalbital-acetaminophen-caffeine (FIORICET) 50-325-40 MG tablet Take one tablet by mouth twice daily as needed, for heradache 14 tablet 0   cetirizine (ZYRTEC) 10 MG tablet Take 10 mg by mouth daily.     No facility-administered medications prior to visit.    PAST MEDICAL HISTORY: Past Medical History:  Diagnosis Date   Allergy    seasonal   Anxiety    Back pain with right-sided radiculopathy 12/12/2020   Cerebrovascular accident (CVA) due to embolism (HCC) 10/08/2015   R  frontal   Common migraine with intractable migraine 11/08/2015   Encounter for gynecological examination with Papanicolaou smear of cervix 12/30/2018   Hyperlipidemia    Stroke (HCC)    Vitamin D deficiency     PAST SURGICAL HISTORY: Past Surgical History:  Procedure Laterality Date   CESAREAN SECTION     PATENT FORAMEN OVALE CLOSURE  09/25/2015   TUBAL LIGATION      FAMILY HISTORY: Family History  Problem Relation Age of Onset   Hypertension Mother    Heart disease Maternal Grandmother    COPD Maternal Grandfather    Cancer Maternal Grandfather        lung   Heart disease Maternal Aunt        murmur   Heart disease Maternal Uncle        murmur   Migraines Neg Hx     SOCIAL HISTORY: Social History   Socioeconomic History   Marital status: Married    Spouse name: Christiane HaJonathan   Number of children: 3   Years of education: 12   Highest education level: Not on  file  Occupational History   Occupation: Bakery assoc    Comment: [employer]  Tobacco Use   Smoking status: Never   Smokeless tobacco: Never  Vaping Use   Vaping Use: Never used  Substance and Sexual Activity   Alcohol use: No   Drug use: No   Sexual activity: Not on file    Comment: tubal  Other Topics Concern   Not on file  Social History Narrative   Lives with husband and 3 children   Right-handed   Caffeine: rare   Social Determinants of Health   Financial Resource Strain: Not on file  Food Insecurity: Not on file  Transportation Needs: Not on file  Physical Activity: Not on file  Stress: Not on file  Social Connections: Not on  file  Intimate Partner Violence: Not on file     PHYSICAL EXAM  GENERAL EXAM/CONSTITUTIONAL: Vitals:  Vitals:   03/13/21 1015  BP: 124/81  Pulse: 70  Weight: 178 lb (80.7 kg)  Height: 5\' 2"  (1.575 m)    Body mass index is 32.56 kg/m. Wt Readings from Last 3 Encounters:  03/13/21 178 lb (80.7 kg)  02/13/21 179 lb 1.9 oz (81.2 kg)  12/05/20 179 lb 1.9 oz (81.2 kg)   Patient is in no distress; well developed, nourished and groomed; neck is supple  EYES: Pupils round and reactive to light, Visual fields full to confrontation, Extraocular movements intacts,   MUSCULOSKELETAL: Gait, strength, tone, movements noted in Neurologic exam below  NEUROLOGIC: MENTAL STATUS:  No flowsheet data found. awake, alert, oriented to person, place and time recent and remote memory intact normal attention and concentration language fluent, comprehension intact, naming intact fund of knowledge appropriate  CRANIAL NERVE:  2nd, 3rd, 4th, 6th - pupils equal and reactive to light, visual fields full to confrontation, extraocular muscles intact, no nystagmus 5th - facial sensation symmetric 7th - facial strength symmetric 8th - hearing intact 9th - palate elevates symmetrically, uvula midline 11th - shoulder shrug symmetric 12th - tongue  protrusion midline  MOTOR:  normal bulk and tone, full strength in the BUE, BLE  SENSORY:  normal and symmetric to light touch, pinprick, temperature, vibration  COORDINATION:  finger-nose-finger, fine finger movements normal  REFLEXES:  deep tendon reflexes present and symmetric  GAIT/STATION:  normal   DIAGNOSTIC DATA (LABS, IMAGING, TESTING) - I reviewed patient records, labs, notes, testing and imaging myself where available.  Lab Results  Component Value Date   WBC 4.6 09/04/2020   HGB 12.5 09/04/2020   HCT 38.6 09/04/2020   MCV 98.7 09/04/2020   PLT 206 09/04/2020      Component Value Date/Time   NA 135 09/04/2020 0348   NA 138 08/30/2020 0901   K 3.5 09/04/2020 0348   CL 105 09/04/2020 0348   CO2 26 09/04/2020 0348   GLUCOSE 96 09/04/2020 0348   BUN 14 09/04/2020 0348   BUN 11 08/30/2020 0901   CREATININE 0.79 09/04/2020 0348   CREATININE 0.81 09/05/2019 1154   CALCIUM 8.6 (L) 09/04/2020 0348   PROT 7.6 09/03/2020 1956   PROT 6.6 08/30/2020 0901   ALBUMIN 4.1 09/03/2020 1956   ALBUMIN 4.1 08/30/2020 0901   AST 21 09/03/2020 1956   ALT 24 09/03/2020 1956   ALKPHOS 54 09/03/2020 1956   BILITOT 0.7 09/03/2020 1956   BILITOT 0.7 08/30/2020 0901   GFRNONAA >60 09/04/2020 0348   GFRNONAA 91 09/05/2019 1154   GFRAA 106 09/05/2019 1154   Lab Results  Component Value Date   CHOL 161 08/30/2020   HDL 69 08/30/2020   LDLCALC 83 08/30/2020   TRIG 40 08/30/2020   CHOLHDL 2.4 08/13/2020   Lab Results  Component Value Date   HGBA1C 5.8 (H) 08/13/2020   No results found for: [id number] Lab Results  Component Value Date   TSH 2.980 08/30/2020    CTA Head 09/20/2020 1.  No aneurysm, significant stenosis or flow limiting lesion within the intracranial vasculature. 2.  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   Amb EEG 09/21/2020 During the course of this 23 hour ambulatory EEG recording,  the interictal EEG showed: a normal background. There were 4 events, consisting of eye twitching or blinking with no ictal correlates. The  4th event also included pins and needles sensation, sense of loss of urinary continence and tiredness afterwards.  Clinical Correlation:  Based on the above clinical and EEG data: The patient's typical episodes were recorded and are not consistent with epileptic seizures. They most likely represent neurologic symptoms.   ASSESSMENT AND PLAN  40 y.o. year old female with past medical history of stroke in 2017, PFO status postrepair, vitamin D deficiency who is presenting for follow-up.  In terms of her episode of weakness, inability to walk, eye blinking, they have markedly improved, she reports having a couple episodes where she had difficulty initiating walking but no falls.  She is currently complaining of headaches about twice a week, takes Fioricet which helped.  Due to her stress, mood, I will start her on lamotrigine 25 mg daily with plan to increase to 100 mg daily for headaches prevention, she is comfortable with plan.  I advised her to take Fioricet as needed for headaches.  I also advised her to set up care with a psychologist or therapist to manage her stress, we have also discussed ways to reduce stress including exercise, including yoga, massages and acupuncture.  She voices understanding and reports she will try the medication and again try to set up care with a therapist. I will see her in 6 months for follow-up.   1. Somatic symptom disorder   2. Tension headache   3. Stress      Patient Instructions  Trial of Lamotrigine 25 mg daily with plan to increase to 100 mg daily, discussed side effects.  Patient will stop and call us if she develops a rash Recommend following up with a therapist for stress management   No orders of the defined types were placed in this encounter.    Meds ordered this encounter  Medications   lamoTRIgine  (LAMICTAL) 25 MG tablet    Sig: 1 tab daily for one week 2 tabs daily for one week then 3 tabs daily for one week then 4 tabs daily for one week.    Dispense:  90 tablet    Refill:  0     Return in about 6 months (around 09/10/2021).  I have spent a total of 45 minutes dedicated to this patient today, preparing to see patient, examining the patient, ordering tests and/or medications, and counseling the patient including review of tests; performing a medically appropriate examination and evaluation; ordering medication, test, and procedures; counseling and educating the patient/family/caregiver; independent independently interpreting result and communicating results to the family/patient/caregiver; and documenting clinical information in the electronic medical record.   Windell Norfolk, MD 03/13/2021, 4:17 PM  [facility] [address], [address] [place], [place] [address] [phone]

## 2021-03-13 NOTE — Patient Instructions (Addendum)
Trial of Lamotrigine 25 mg daily with plan to increase to 100 mg daily, discussed side effects.  Patient will stop and call us if she develops a rash Recommend following up with a therapist for stress management

## 2021-03-27 ENCOUNTER — Telehealth (INDEPENDENT_AMBULATORY_CARE_PROVIDER_SITE_OTHER): Payer: Medicaid Other | Admitting: Licensed Clinical Social Worker

## 2021-03-27 DIAGNOSIS — F449 Dissociative and conversion disorder, unspecified: Secondary | ICD-10-CM

## 2021-03-27 DIAGNOSIS — F419 Anxiety disorder, unspecified: Secondary | ICD-10-CM

## 2021-03-27 NOTE — Telephone Encounter (Signed)
Left message encouraging contact 

## 2021-04-01 DIAGNOSIS — M545 Low back pain, unspecified: Secondary | ICD-10-CM | POA: Diagnosis not present

## 2021-04-01 DIAGNOSIS — M7711 Lateral epicondylitis, right elbow: Secondary | ICD-10-CM | POA: Diagnosis not present

## 2021-04-04 ENCOUNTER — Telehealth: Payer: Self-pay | Admitting: Family Medicine

## 2021-04-04 NOTE — Telephone Encounter (Signed)
Patient called in requesting referral for Dr. Merlene Laughter [facility] on [address].  ?

## 2021-04-05 NOTE — Telephone Encounter (Signed)
Ok to refer to [facility] for migraine? ?

## 2021-04-08 ENCOUNTER — Other Ambulatory Visit: Payer: Self-pay

## 2021-04-08 DIAGNOSIS — G43019 Migraine without aura, intractable, without status migrainosus: Secondary | ICD-10-CM

## 2021-04-15 ENCOUNTER — Ambulatory Visit (HOSPITAL_COMMUNITY): Payer: Medicaid Other | Attending: Orthopaedic Surgery | Admitting: Physical Therapy

## 2021-04-15 ENCOUNTER — Other Ambulatory Visit: Payer: Self-pay

## 2021-04-15 DIAGNOSIS — M545 Low back pain, unspecified: Secondary | ICD-10-CM | POA: Insufficient documentation

## 2021-04-15 DIAGNOSIS — M6281 Muscle weakness (generalized): Secondary | ICD-10-CM | POA: Insufficient documentation

## 2021-04-15 NOTE — Therapy (Signed)
?OUTPATIENT PHYSICAL THERAPY THORACOLUMBAR EVALUATION ? ? ?Patient Name: Marissa [name] ?MRN: [id number] ?DOB:06/30/1980, 41 y.o., female ?Today's Date: 04/15/2021 ? ? PT End of Session - 04/15/21 0919   ? ? Visit Number 1   ? Number of Visits 8   ? Date for PT Re-Evaluation 05/15/21   ? Authorization Type healthy blue requeted 8 visits   ? PT Start Time [phone]   ? PT Stop Time 0955   ? PT Time Calculation (min) 33 min   ? Activity Tolerance Patient tolerated treatment well   ? Behavior During Therapy [facility] for tasks assessed/performed   ? ?  ?  ? ?  ? ? ?Past Medical History:  ?Diagnosis Date  ? Allergy   ? seasonal  ? Anxiety   ? Back pain with right-sided radiculopathy 12/12/2020  ? Cerebrovascular accident (CVA) due to embolism (HCC) 10/08/2015  ? R  frontal  ? Common migraine with intractable migraine 11/08/2015  ? Encounter for gynecological examination with Papanicolaou smear of cervix 12/30/2018  ? Hyperlipidemia   ? Stroke [facility](HCC)   ? Vitamin D deficiency   ? ?Past Surgical History:  ?Procedure Laterality Date  ? CESAREAN SECTION    ? PATENT FORAMEN OVALE CLOSURE  09/25/2015  ? TUBAL LIGATION    ? ?Patient Active Problem List  ? Diagnosis Date Noted  ? Back pain with right-sided radiculopathy 12/12/2020  ? Neurological disorder 09/07/2020  ? Speech disturbance 09/03/2020  ? Spell of abnormal behavior   ? Abnormal MRI of head   ? TIA (transient ischemic attack) 08/13/2020  ? History of CVA (cerebrovascular accident) 12/09/2018  ? Anxiety 12/09/2018  ? Obesity (BMI 30.0-34.9) 12/09/2018  ? Common migraine with intractable migraine 11/08/2015  ? PFO (patent foramen ovale) 10/08/2015  ? HLD (hyperlipidemia) 10/08/2015  ? Vitamin D deficiency 10/08/2015  ? ? ?PCP: Kerri PerchesSimpson, Margaret E, MD ? ?REFERRING PROVIDER: Marcene Corningalldorf, Peter, MD ? ?REFERRING DIAG: Pt Eval And Tx For Low Back Pain And Right Sided Tennis Elbow-Per Marcene CorningPeter Dalldorf Md  ? ?THERAPY DIAG:  ?Muscle weakness (generalized) ? ?Acute bilateral low back pain  without sciatica ? ?ONSET DATE: 03/16/2021 ? ?SUBJECTIVE:                                                                                                                                                                                          ? ?SUBJECTIVE STATEMENT: ?PT states that her her pain came on insidiously.  It is not increasing or decreasing in intensity.  Her MD gave he prednisone without relief.  Pt states that she has tried ice, heat, tylenol, propped on pillows to decrease her  pain but none of this has really affected her pain.  The pain is constant, even when lying down.  Goes back to MD on 4/ 17/2023.  ?PERTINENT HISTORY:  ?past medical history of stroke in 2017, migraine and vitamin D deficiency  ? ?PAIN:  ?Are you having pain? Yes: 5; can get up to a 9 or 10; lowest is a 2/10 ? ? ?PRECAUTIONS: None ? ?WEIGHT BEARING RESTRICTIONS No ? ?FALLS:  ?Has patient fallen in last 6 months? No, Number of falls: 0 ? ?LIVING ENVIRONMENT: ?Lives with: lives with their family ?Lives in: House/apartment ?Stairs: Yes: External: 12 steps; on right going up ?Has following equipment at home: None ? ?OCCUPATION: [employer]  ? ?PLOF: Independent ? ?PATIENT GOALS less pain,  ? ? ?OBJECTIVE:  ? ?DIAGNOSTIC FINDINGS:  ?IMPRESSION: ?Degenerative changes, without evidence of fractures or malalignment ? ? ?COGNITION: ? Overall cognitive status: Within functional limits for tasks assessed   ?  ?SENSATION: ?Not tested ? ?MUSCLE LENGTH: ?Hamstrings: Right 150 deg; Left 150 deg ? ?POSTURE:  ?Normal  ? ?PALPATION:  Pt is tender to palpation but no mm spasms noted  ? ? ?LUMBAR ROM:  ? ?Active  A/PROM  ?04/15/2021  ?Flexion Fingers to floor difficulty returning to upright position   ?Extension 20  ?Right lateral flexion WFL  ?Left lateral flexion WFL  ?Right rotation WFL  ?Left rotation WFL   ? (Blank rows = not tested) ? ? ?LE MMT: ? ?MMT Right ?04/15/2021 Left ?04/15/2021  ?Hip flexion 4+/5 4+/5  ?Hip extension 2/5 2/5  ?Hip abduction  3+/5 3+/5  ?Hip adduction    ?Hip internal rotation    ?Hip external rotation    ?Knee flexion 4/5 4-/5   ?Knee extension 4+/5 5/5  ?Ankle dorsiflexion 3+/5 3+/5  ?Ankle plantarflexion    ?Ankle inversion    ?Ankle eversion    ? (Blank rows = not tested) ? ? ? ?FUNCTIONAL TESTS:  ?30 seconds chair stand test: unable to complete one without UE assist ? ? ? ? ?TODAY'S TREATMENT  ?Standing extension x5 ?Supine:  hamstring stretch, knee to chest stretch 30" x 2 ?              Bridge x 5 ? ? ?PATIENT EDUCATION:  ?Education details: HEP ?Person educated: Patient ?Education method: Explanation ?Education comprehension: returned demonstration ? ? ?HOME EXERCISE PROGRAM: ?Standing extension x5 ?Supine:  hamstring stretch, knee to chest stretch 30" x 2 ?              Bridge x 5 ? ? ?ASSESSMENT: ? ?CLINICAL IMPRESSION: ?Patient is a 41 y.o. female who was seen today for physical therapy evaluation and treatment for low back pain.  Evaluation demonstrates tight mm, weak mm, decreased knowledge of proper body mechanics.  Ms. Bogdanski will benefit from skilled PT to address these issues and improve her back pain to allow pt to be more functional at home and at work.  ? ? ?OBJECTIVE IMPAIRMENTS decreased activity tolerance, decreased balance, decreased strength, and pain.  ? ?ACTIVITY LIMITATIONS cleaning, community activity, occupation, and shopping.  ? ?PERSONAL FACTORS 1 comorbidity: Past CVA  are also affecting patient's functional outcome.  ? ? ?REHAB POTENTIAL: Good ? ?CLINICAL DECISION MAKING: Stable/uncomplicated ? ?EVALUATION COMPLEXITY: Low ? ? ?GOALS: ?Goals reviewed with patient? No will in second visit  ? ?SHORT TERM GOALS: Target date: 05/06/2021 ? ?PT to be I in HEP in order to increase LE and core mm one grace to decrease back pain  to no greater than a 5/10 ?Baseline: max 8-9 ?Goal status: INITIAL ?2.  Pt to be able to demonstrate good body mechanics for bed mobility   ?Baseline: poor body mechanics ?Goal status:  INITIAL ? ? ? ? ?LONG TERM GOALS: Target date: 05/27/2021 ? ?PT to be I in advanced HEP in order to increase LE and core mm one grace to decrease back pain to no greater than a 3/10 ?Baseline: max 8-9 ?Goal status: INITIAL ? ?2.  Pt to be able to demonstrate good body mechanics for lifting  ?Baseline: poor body mechanics ?Goal status: INITIAL ? ? ? ? ? ?PLAN: ?PT FREQUENCY: 2x/week ? ?PT DURATION: 6 weeks ? ?PLANNED INTERVENTIONS: Therapeutic exercises, Therapeutic activity, Neuromuscular re-education, Balance training, Gait training, Patient/Family education, Joint mobilization, and Manual therapy. ? ?PLAN FOR NEXT SESSION: ab set, clam, dead bug, heel raises squats; progress education for body mechanics and stability exercises.  ? ?Virgina Organ, PT CLT ?[phone]  ? ?04/15/2021, 9:42 AM  ?

## 2021-04-22 ENCOUNTER — Ambulatory Visit (HOSPITAL_COMMUNITY): Payer: Medicaid Other | Attending: Orthopaedic Surgery | Admitting: Physical Therapy

## 2021-04-22 ENCOUNTER — Encounter (HOSPITAL_COMMUNITY): Payer: Self-pay | Admitting: Physical Therapy

## 2021-04-22 DIAGNOSIS — M6281 Muscle weakness (generalized): Secondary | ICD-10-CM | POA: Insufficient documentation

## 2021-04-22 DIAGNOSIS — M545 Low back pain, unspecified: Secondary | ICD-10-CM | POA: Insufficient documentation

## 2021-04-22 NOTE — Patient Instructions (Signed)
Access Code: [email or web address] ?URL: [email or web address] ?Date: 04/22/2021 ?Prepared by: Greig Castilla Svea Pusch ? ?Exercises ?- Sidelying Hip Abduction  - 1 x daily - 7 x weekly - 2 sets - 10 reps ?- Supine March  - 1 x daily - 7 x weekly - 3 sets - 10 reps ?

## 2021-04-22 NOTE — Therapy (Signed)
?OUTPATIENT PHYSICAL THERAPY TREATMENT NOTE ? ? ?Patient Name: NALIYA GISH ?MRN: [id number] ?DOB:Jan 23, 1980, 41 y.o., female ?Today's Date: 04/22/2021 ? ?PCP: Kerri Perches, MD ?REFERRING PROVIDER: Marcene Corning, MD ? ? PT End of Session - 04/22/21 0830   ? ? Visit Number 2   ? Number of Visits 12   ? Date for PT Re-Evaluation 05/27/21   ? Authorization Type healthy blue requeted 12 visits   ? PT Start Time 0831   ? PT Stop Time 0911   ? PT Time Calculation (min) 40 min   ? Activity Tolerance Patient tolerated treatment well   ? Behavior During Therapy [facility] for tasks assessed/performed   ? ?  ?  ? ?  ? ? ?Past Medical History:  ?Diagnosis Date  ? Allergy   ? seasonal  ? Anxiety   ? Back pain with right-sided radiculopathy 12/12/2020  ? Cerebrovascular accident (CVA) due to embolism (HCC) 10/08/2015  ? R  frontal  ? Common migraine with intractable migraine 11/08/2015  ? Encounter for gynecological examination with Papanicolaou smear of cervix 12/30/2018  ? Hyperlipidemia   ? Stroke [facility])   ? Vitamin D deficiency   ? ?Past Surgical History:  ?Procedure Laterality Date  ? CESAREAN SECTION    ? PATENT FORAMEN OVALE CLOSURE  09/25/2015  ? TUBAL LIGATION    ? ?Patient Active Problem List  ? Diagnosis Date Noted  ? Back pain with right-sided radiculopathy 12/12/2020  ? Neurological disorder 09/07/2020  ? Speech disturbance 09/03/2020  ? Spell of abnormal behavior   ? Abnormal MRI of head   ? TIA (transient ischemic attack) 08/13/2020  ? History of CVA (cerebrovascular accident) 12/09/2018  ? Anxiety 12/09/2018  ? Obesity (BMI 30.0-34.9) 12/09/2018  ? Common migraine with intractable migraine 11/08/2015  ? PFO (patent foramen ovale) 10/08/2015  ? HLD (hyperlipidemia) 10/08/2015  ? Vitamin D deficiency 10/08/2015  ? ? ?REFERRING DIAG: Pt Eval And Tx For Low Back Pain And Right Sided Tennis Elbow-Per Marcene Corning Md  ? ?THERAPY DIAG:  ?Muscle weakness (generalized) ? ?Acute bilateral low back pain without  sciatica ? ?PERTINENT HISTORY: past medical history of stroke in 2017, migraine and vitamin D deficiency  ? ?PRECAUTIONS: None ? ?SUBJECTIVE: Back is still about the same.Patient also having elbow pain. She is having problems with her legs, trouble getting up with transfers and walking sometimes. She also can have other strange symptoms such as speech difficulty with mumble/ delayed, R hand tremor/jerk, headache/dizziness, eye blinking, pins/needles in legs.   ? ?PAIN:  ?Are you having pain? Yes: NPRS scale: 5/10 ?Pain location: low back ?Pain description: dull constant ?Aggravating factors: sitting, breathing in hard ?Relieving factors: ice ? ? ?OBJECTIVE:  ?  ?  ?LUMBAR ROM:  ?  ?Active  A/PROM  ?04/15/2021  ?Flexion Fingers to floor difficulty returning to upright position   ?Extension 20  ?Right lateral flexion WFL  ?Left lateral flexion WFL  ?Right rotation WFL  ?Left rotation WFL   ? (Blank rows = not tested) ?  ?  ?LE MMT: ?  ?MMT Right ?04/15/2021 Left ?04/15/2021  ?Hip flexion 4+/5 4+/5  ?Hip extension 2/5 2/5  ?Hip abduction 3+/5 3+/5  ?Hip adduction      ?Hip internal rotation      ?Hip external rotation      ?Knee flexion 4/5 4-/5   ?Knee extension 4+/5 5/5  ?Ankle dorsiflexion 3+/5 3+/5  ?Ankle plantarflexion      ?Ankle inversion      ?  Ankle eversion      ? (Blank rows = not tested) ?  ?  ?  ?FUNCTIONAL TESTS:  ?30 seconds chair stand test: unable to complete one without UE assist ?  ? ?Sensation WFL bilateral LE ?VBI testing negative bilateral  ?  ?  ?TODAY'S TREATMENT  ?04/22/21 ?Education and POC discussion ?Bridge 2x 10  ?Ab set 10 x 5 second holds ?March with ab set 2x 10 ?Hip abduction 2x 10 bilateral  ? ?04/15/21 ?Standing extension x5 ?Supine:  hamstring stretch, knee to chest stretch 30" x 2 ?              Bridge x 5 ?  ?  ?PATIENT EDUCATION:  ?Education details: HEP, reasons for weakness, treating impairments vs. Diagnosis, possibly of old vs. New neuro symptoms, completing strength training to  assess response ?Person educated: Patient ?Education method: Explanation ?Education comprehension: returned demonstration ?  ?  ?HOME EXERCISE PROGRAM: ?Standing extension x5, Supine:  hamstring stretch, knee to chest stretch 30" x 2, Bridge x 5; 4/3 hip abduction  ?  ?  ?ASSESSMENT: ?  ?CLINICAL IMPRESSION: ?Began session with additional questions/testing as patient concerned with LE weakness and other intermittent symptoms. Educated patient regarding questions. Discussed continued core/LE strengthening and assessing response on symptoms. Began additional core and LE strengthening exercises which are tolerated well but patient with quick gluteal fatigue.  Patient will continue to benefit from physical therapy in order to improve function and reduce impairment. ? ?  ?  ?OBJECTIVE IMPAIRMENTS decreased activity tolerance, decreased balance, decreased strength, and pain.  ?  ?ACTIVITY LIMITATIONS cleaning, community activity, occupation, and shopping.  ?  ?PERSONAL FACTORS 1 comorbidity: Past CVA  are also affecting patient's functional outcome.  ?  ?  ?REHAB POTENTIAL: Good ?  ?CLINICAL DECISION MAKING: Stable/uncomplicated ?  ?EVALUATION COMPLEXITY: Low ?  ?  ?GOALS: ?Goals reviewed with patient? Yes ?  ?SHORT TERM GOALS: Target date: 05/06/2021 ?  ?PT to be I in HEP in order to increase LE and core mm one grace to decrease back pain to no greater than a 5/10 ?Baseline: max 8-9 ?Goal status: INITIAL ?2.  Pt to be able to demonstrate good body mechanics for bed mobility   ?Baseline: poor body mechanics ?Goal status: INITIAL ?  ?  ?  ?  ?LONG TERM GOALS: Target date: 05/27/2021 ?  ?PT to be I in advanced HEP in order to increase LE and core mm one grace to decrease back pain to no greater than a 3/10 ?Baseline: max 8-9 ?Goal status: INITIAL ?  ?2.  Pt to be able to demonstrate good body mechanics for lifting  ?Baseline: poor body mechanics ?Goal status: INITIAL ?  ?  ?  ?  ?  ?PLAN: ?PT FREQUENCY: 2x/week ?  ?PT  DURATION: 6 weeks ?  ?PLANNED INTERVENTIONS: Therapeutic exercises, Therapeutic activity, Neuromuscular re-education, Balance training, Gait training, Patient/Family education, Joint mobilization, and Manual therapy. ?  ?PLAN FOR NEXT SESSION: Continue glute/core strength and LE strength; progress education for body mechanics and stability exercises.  ? ? ?8:34 AM, 04/22/21 ?Wyman Songster PT, DPT ?Physical Therapist at [facility] ?[facility] ? ? ?   ?

## 2021-04-24 ENCOUNTER — Encounter: Payer: Self-pay | Admitting: Family Medicine

## 2021-04-24 ENCOUNTER — Ambulatory Visit (INDEPENDENT_AMBULATORY_CARE_PROVIDER_SITE_OTHER): Payer: Medicaid Other | Admitting: Family Medicine

## 2021-04-24 ENCOUNTER — Telehealth (HOSPITAL_COMMUNITY): Payer: Self-pay | Admitting: Physical Therapy

## 2021-04-24 ENCOUNTER — Ambulatory Visit (HOSPITAL_COMMUNITY): Payer: Medicaid Other | Admitting: Physical Therapy

## 2021-04-24 VITALS — BP 119/72 | HR 80 | Ht 62.0 in | Wt 180.0 lb

## 2021-04-24 DIAGNOSIS — G43019 Migraine without aura, intractable, without status migrainosus: Secondary | ICD-10-CM | POA: Diagnosis not present

## 2021-04-24 DIAGNOSIS — R4689 Other symptoms and signs involving appearance and behavior: Secondary | ICD-10-CM

## 2021-04-24 DIAGNOSIS — E559 Vitamin D deficiency, unspecified: Secondary | ICD-10-CM

## 2021-04-24 DIAGNOSIS — E669 Obesity, unspecified: Secondary | ICD-10-CM | POA: Diagnosis not present

## 2021-04-24 DIAGNOSIS — E785 Hyperlipidemia, unspecified: Secondary | ICD-10-CM

## 2021-04-24 DIAGNOSIS — Z8673 Personal history of transient ischemic attack (TIA), and cerebral infarction without residual deficits: Secondary | ICD-10-CM

## 2021-04-24 DIAGNOSIS — G988 Other disorders of nervous system: Secondary | ICD-10-CM | POA: Diagnosis not present

## 2021-04-24 NOTE — Patient Instructions (Addendum)
F/u in 6 months, call if you need me sooner ? ?I recommend you call and schedule f/u with [facility] since you continue to have problems ? ?Thankful that medication for headaches works ? ?It is important that you exercise regularly at least 30 minutes 5 times a week. If you develop chest pain, have severe difficulty breathing, or feel very tired, stop exercising immediately and seek medical attention  ? ?Work on increasing vegetable and fruit and water intake for healthier eating and weight loss ? ?Thanks for choosing [facility], we consider it a privelige to serve you. ? ? ?Fasting CBC, lipid, chem 7 and EGFr, tSH and vit D 1 week before next visit ?

## 2021-04-24 NOTE — Progress Notes (Signed)
? ?  Marissa [name]     MRN: [id number]      DOB: May 25, 1980 ? ? ?HPI ?Marissa [name] is here reporting daily difficulty satnding, walking , generalized pain, dizzy, nauseating headache, over tired, uncontrolled eye blinking, expressive aphasia and right forearm tremor, on avg 3 to 4 days per week  ?Has changed a from [employer] has upcoming new appt with local Neurologist and is also asking bout changing from one tertiary center Neuro at [facility] to [facility] ?I advise against switching tertiary care and also against having 2 different Neurologists, states she understands.Has video recording of abnormal movements and difficulty moving ?States fioricet manages headaches once they do occur ? ?ROS ?Denies recent fever or chills. ?Denies sinus pressure, nasal congestion, ear pain or sore throat. ?Denies chest congestion, productive cough or wheezing. ?Denies chest pains, palpitations and leg swelling ?Denies abdominal pain, nausea, vomiting,diarrhea or constipation.   ?Denies dysuria, frequency, hesitancy or incontinence. ? ?Denies skin break down or rash. ? ? ?PE ? ?BP 119/72   Pulse 80   Ht 5\' 2"  (1.575 m)   Wt 180 lb (81.6 kg)   SpO2 98%   BMI 32.92 kg/m?  ? ?Patient alert and oriented and in no cardiopulmonary distress. ? ?HEENT: No facial asymmetry, EOMI,     Neck supple . ? ?Chest: Clear to auscultation bilaterally. ? ?CVS: S1, S2 no murmurs, no S3.Regular rate. ? ?ABD: Soft non tender.  ? ?Ext: No edema ? ?MS: Adequate ROM spine, shoulders, hips and knees. ? ?Skin: Intact, no ulcerations or rash noted. ? ?Psych: Good eye contact, normal affect. Memory intact not anxious or depressed appearing. ? ?CNS: CN 2-12 intact, power,  normal throughout.no focal deficits noted. ? ? ?Assessment & Plan ? ?Spell of abnormal behavior ?Reports ongoing repeated episodes, needs to return to [facility] neurology and will call for appt, I will also refer , has video recordings on her phone also ? ?Obesity (BMI 30.0-34.9) ? ?Patient  re-educated about  the importance of commitment to a  minimum of 150 minutes of exercise per week as able. ? ?The importance of healthy food choices with portion control discussed, as well as eating regularly and within a 12 hour window most days. ?The need to choose "clean , green" food 50 to 75% of the time is discussed, as well as to make water the primary drink and set a goal of 64 ounces water daily. ? ?  ? ?  04/24/2021  ?  9:31 AM 03/13/2021  ? 10:15 AM 02/13/2021  ?  1:09 PM  ?Weight /BMI  ?Weight 180 lb 178 lb 179 lb 1.9 oz  ?Height 5\' 2"  (1.575 m) 5\' 2"  (1.575 m) 5\' 2"  (1.575 m)  ?BMI 32.92 kg/m2 32.56 kg/m2 32.76 kg/m2  ? ? ? ? ?Common migraine with intractable migraine ?Responds to fioricet , when experiences headache, continue same ? ?Neurological disorder ?Needs to contiue f/u at [facility] ? ?History of CVA (cerebrovascular accident) ?Reported on imaging study ? ?

## 2021-04-29 ENCOUNTER — Encounter: Payer: Self-pay | Admitting: Family Medicine

## 2021-04-29 NOTE — Assessment & Plan Note (Signed)
Reports ongoing repeated episodes, needs to return to [facility] neurology and will call for appt, I will also refer , has video recordings on her phone also ?

## 2021-04-29 NOTE — Assessment & Plan Note (Signed)
Responds to fioricet , when experiences headache, continue same ?

## 2021-04-29 NOTE — Assessment & Plan Note (Signed)
Needs to contiue f/u at [facility] ?

## 2021-04-29 NOTE — Assessment & Plan Note (Signed)
?  Patient re-educated about  the importance of commitment to a  minimum of 150 minutes of exercise per week as able. ? ?The importance of healthy food choices with portion control discussed, as well as eating regularly and within a 12 hour window most days. ?The need to choose "clean , green" food 50 to 75% of the time is discussed, as well as to make water the primary drink and set a goal of 64 ounces water daily. ? ?  ? ?  04/24/2021  ?  9:31 AM 03/13/2021  ? 10:15 AM 02/13/2021  ?  1:09 PM  ?Weight /BMI  ?Weight 180 lb 178 lb 179 lb 1.9 oz  ?Height 5\' 2"  (1.575 m) 5\' 2"  (1.575 m) 5\' 2"  (1.575 m)  ?BMI 32.92 kg/m2 32.56 kg/m2 32.76 kg/m2  ? ? ? ?

## 2021-04-29 NOTE — Assessment & Plan Note (Signed)
Reported on imaging study ?

## 2021-04-30 ENCOUNTER — Ambulatory Visit (HOSPITAL_COMMUNITY): Payer: Medicaid Other | Admitting: Physical Therapy

## 2021-04-30 ENCOUNTER — Telehealth (HOSPITAL_COMMUNITY): Payer: Self-pay | Admitting: Physical Therapy

## 2021-04-30 NOTE — Telephone Encounter (Signed)
Pt did not show for appointment.  Called and left a message regarding NS and reminder for next appt tomorrow at 3:30.  Asked to call and cancel if she cannot make this day. ? ?Lurena Nida, PTA/CLT, WTA ?[phone] ? ?

## 2021-05-01 ENCOUNTER — Encounter (HOSPITAL_COMMUNITY): Payer: Medicaid Other | Admitting: Physical Therapy

## 2021-05-01 NOTE — Telephone Encounter (Signed)
PT with second consecutive NS.  Called and left voicemail regarding NS policy and cancellation of all remaining appts except for next one scheduled on Tuesday.   ? ?Lurena Nida, PTA/CLT, WTA ?[phone] ? ?

## 2021-05-07 ENCOUNTER — Telehealth (HOSPITAL_COMMUNITY): Payer: Self-pay | Admitting: Physical Therapy

## 2021-05-07 ENCOUNTER — Encounter (HOSPITAL_COMMUNITY): Payer: Self-pay | Admitting: Physical Therapy

## 2021-05-07 ENCOUNTER — Encounter (HOSPITAL_COMMUNITY): Payer: Medicaid Other | Admitting: Physical Therapy

## 2021-05-07 NOTE — Therapy (Unsigned)
[place] [facility] ?[address] ?[place], [place], [address] ?Phone: [phone]   Fax:  [phone] ? ?Patient Details  ?Name: Marissa [name] ?MRN: [id number] ?Date of Birth: 1980/03/26 ?Referring Provider:  No ref. provider found ? ?Encounter Date: 05/07/2021 ?PHYSICAL THERAPY DISCHARGE SUMMARY ? ?Visits from Start of Care: 2 ? ?Current functional level related to goals / functional outcomes: ?Unknown as pt did not show ?  ?Remaining deficits: ?unknown ?  ?Education / Equipment: ?HEP  ? ?Patient agrees to discharge. Patient goals were not met. Patient is being discharged due to not returning since the last visit.  ?Rayetta Humphrey, PT CLT ?[phone] 05/07/2021, 5:04 PM ? ?[place] ?[place] ?[address] ?[place], [place], [address] ?Phone: [phone]   Fax:  [phone] ?

## 2021-05-07 NOTE — Telephone Encounter (Signed)
Pt with 3 NS and per policy is now discharged from rehab services.  Evaluating therapist made of aware. ? ?Lurena Nida, PTA/CLT, WTA ?[phone] ? ?

## 2021-05-08 ENCOUNTER — Encounter (HOSPITAL_COMMUNITY): Payer: Medicaid Other | Admitting: Physical Therapy

## 2021-05-13 ENCOUNTER — Encounter (HOSPITAL_COMMUNITY): Payer: Medicaid Other | Admitting: Physical Therapy

## 2021-05-14 ENCOUNTER — Encounter (HOSPITAL_COMMUNITY): Payer: Medicaid Other | Admitting: Physical Therapy

## 2021-05-15 ENCOUNTER — Ambulatory Visit: Payer: Medicaid Other | Admitting: Family Medicine

## 2021-05-21 ENCOUNTER — Encounter (HOSPITAL_COMMUNITY): Payer: Medicaid Other | Admitting: Physical Therapy

## 2021-05-22 ENCOUNTER — Encounter (HOSPITAL_COMMUNITY): Payer: Medicaid Other | Admitting: Physical Therapy

## 2021-05-27 ENCOUNTER — Encounter (HOSPITAL_COMMUNITY): Payer: Medicaid Other | Admitting: Physical Therapy

## 2021-05-29 ENCOUNTER — Encounter (HOSPITAL_COMMUNITY): Payer: Medicaid Other | Admitting: Physical Therapy

## 2021-06-03 DIAGNOSIS — Z79891 Long term (current) use of opiate analgesic: Secondary | ICD-10-CM | POA: Diagnosis not present

## 2021-06-03 DIAGNOSIS — I639 Cerebral infarction, unspecified: Secondary | ICD-10-CM | POA: Diagnosis not present

## 2021-06-03 DIAGNOSIS — R251 Tremor, unspecified: Secondary | ICD-10-CM | POA: Diagnosis not present

## 2021-06-03 DIAGNOSIS — F411 Generalized anxiety disorder: Secondary | ICD-10-CM | POA: Diagnosis not present

## 2021-07-09 ENCOUNTER — Ambulatory Visit
Admission: EM | Admit: 2021-07-09 | Discharge: 2021-07-09 | Disposition: A | Discharge status: Home / Self Care | Payer: Medicaid Other

## 2021-07-09 DIAGNOSIS — R2232 Localized swelling, mass and lump, left upper limb: Secondary | ICD-10-CM | POA: Diagnosis not present

## 2021-07-09 DIAGNOSIS — M79642 Pain in left hand: Secondary | ICD-10-CM | POA: Diagnosis not present

## 2021-07-09 MED ORDER — PREDNISONE 20 MG PO TABS: 40.0000 mg | ORAL_TABLET | Freq: Every day | ORAL | 0 refills | Status: AC

## 2021-07-09 NOTE — ED Provider Notes (Signed)
[facility]    CSN: [id number] Arrival date & time: 07/09/21  7001      History   Chief Complaint Chief Complaint  Patient presents with   Hand Pain    HPI Marissa [name] is a 41 y.o. female.    Hand Pain    Patient presents for complaints of left hand pain and swelling for the past 2 days.  Patient states pain starts in the left pinky finger and to the outside of her hand.  She also complains of swelling to the other fingers of her left hand.  Patient states making a fist, and moving her fingers makes her hand pain worse.  She also states the area is sensitive to touch.  Patient denies injury, trauma, numbness, tingling, or radiation of pain into the wrist or forearm.  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  She has been taking ibuprofen for her symptoms.  She is right-hand dominant.  Past Medical History:  Diagnosis Date   Allergy    seasonal   Anxiety    Back pain with right-sided radiculopathy 12/12/2020   Cerebrovascular accident (CVA) due to embolism (HCC) 10/08/2015   R  frontal   Common migraine with intractable migraine 11/08/2015   Encounter for gynecological examination with Papanicolaou smear of cervix 12/30/2018   Hyperlipidemia    Stroke [facility])    Vitamin D deficiency     Patient Active Problem List   Diagnosis Date Noted   Back pain with right-sided radiculopathy 12/12/2020   Neurological disorder 09/07/2020   Speech disturbance 09/03/2020   Spell of abnormal behavior    Abnormal MRI of head    TIA (transient ischemic attack) 08/13/2020   History of CVA (cerebrovascular accident) 12/09/2018   Anxiety 12/09/2018   Obesity (BMI 30.0-34.9) 12/09/2018   Common migraine with intractable migraine 11/08/2015   PFO (patent foramen ovale) 10/08/2015   HLD (hyperlipidemia) 10/08/2015   Vitamin D deficiency 10/08/2015    Past Surgical History:  Procedure Laterality Date   CESAREAN SECTION      PATENT FORAMEN OVALE CLOSURE  09/25/2015   TUBAL LIGATION      OB History     Gravida  3   Para  3   Term  2   Preterm  1   AB      Living  3      SAB      IAB      Ectopic      Multiple      Live Births               Home Medications    Prior to Admission medications   Medication Sig Start Date End Date Taking? Authorizing Provider  ibuprofen (ADVIL) 200 MG tablet Take 200 mg by mouth every 6 (six) hours as needed.   Yes [provider]  predniSONE (DELTASONE) 20 MG tablet Take 2 tablets (40 mg total) by mouth daily with breakfast for 5 days. 07/09/21 07/14/21 Yes Terilynn Buresh-Warren, Sadie Haber, NP  acetaminophen (TYLENOL) 500 MG tablet Take 1,000 mg by mouth every 6 (six) hours as needed.    [provider]  aspirin 81 MG chewable tablet Chew 81 mg by mouth once as needed. Patient not taking: Reported on 04/24/2021    [provider]  butalbital-acetaminophen-caffeine Marikay Alar) [phone] MG tablet Take one tablet by mouth twice daily as needed, for heradache 02/13/21   Kerri Perches, MD  cetirizine (ZYRTEC) 10 MG tablet Take 10 mg by mouth daily.    [provider]    Family History Family History  Problem Relation Age of Onset   Hypertension Mother    Heart disease Maternal Grandmother    COPD Maternal Grandfather    Cancer Maternal Grandfather        lung   Heart disease Maternal Aunt        murmur   Heart disease Maternal Uncle        murmur   Migraines Neg Hx     Social History Social History   Tobacco Use   Smoking status: Never   Smokeless tobacco: Never  Vaping Use   Vaping Use: Never used  Substance Use Topics   Alcohol use: No   Drug use: No     Allergies   Triptans and Morphine and related   Review of Systems Review of Systems Per HPI  Physical Exam Triage Vital Signs ED Triage Vitals  Enc Vitals Group     BP 07/09/21 0827 124/81     Pulse Rate 07/09/21 0827 77     Resp 07/09/21  0827 16     Temp 07/09/21 0827 98.4 F (36.9 C)     Temp Source 07/09/21 0827 Oral     SpO2 07/09/21 0827 98 %     Weight --      Height --      Head Circumference --      Peak Flow --      Pain Score 07/09/21 0826 7     Pain Loc --      Pain Edu? --      Excl. in GC? --    No data found.  Updated Vital Signs BP 124/81 (BP Location: Right Arm)   Pulse 77   Temp 98.4 F (36.9 C) (Oral)   Resp 16   LMP  (Within Weeks) Comment: 1 1/2 week  SpO2 98%   Visual Acuity Right Eye Distance:   Left Eye Distance:   Bilateral Distance:    Right Eye Near:   Left Eye Near:    Bilateral Near:     Physical Exam Vitals and nursing note reviewed.  Constitutional:      Appearance: Normal appearance.  Cardiovascular:     Rate and Rhythm: Normal rate and regular rhythm.  Pulmonary:     Effort: Pulmonary effort is normal.     Breath sounds: Normal breath sounds.  Abdominal:     General: Bowel sounds are normal.     Palpations: Abdomen is soft.  Musculoskeletal:     Left hand: Swelling and tenderness present. No deformity. Decreased range of motion. Decreased strength of finger abduction. Normal capillary refill. Normal pulse.     Comments: Tenderness to the small finger of the left hand.  Digits 3-4 also have swelling.  There is no obvious deformity.  Skin:    General: Skin is warm and dry.  Neurological:     General: No focal deficit present.     Mental Status: She is alert and oriented to person, place, and time.      UC Treatments / Results  Labs (all labs ordered are listed, but only abnormal results are displayed) Labs Reviewed - No data to display  EKG   Radiology No results found.  Procedures Procedures (including critical care time)  Medications Ordered in UC Medications - No data to display  Initial Impression / Assessment and Plan / UC Course  I have reviewed the triage vital signs and the nursing notes.  Pertinent labs & imaging results that were  available during my care of the patient were reviewed by me and considered in my medical decision making (see chart for details).  Patient presents with pain and swelling to the left hand that started approximately 2 days ago.  Patient complains of increased pain in the left small finger.  On exam, she has decreased range of motion and decreased grip strength.  Digits 2 through 4 of the left hand are also swollen.  Patient denies any known injury, trauma, or other trigger.  Difficult to ascertain the cause of the patient's symptoms at this time.  Imaging is not available, but this would not change the course of treatment.  We will treat patient symptomatically with prednisone to help see if this helps with the inflammation.  Patient was also advised to use RICE therapy while her symptoms persist.  Ace wrap was provided to the patient in the clinic today.  Patient advised to follow-up in the next 7 to 10 days if her symptoms do not improve, will consider imaging at that time. Final Clinical Impressions(s) / UC Diagnoses   Final diagnoses:  Left hand pain  Localized swelling on left hand     Discharge Instructions      Take medication as prescribed. Continue to apply ice to the left hand.  Apply for 20 minutes, remove for 1 hour, then repeat.  Do this is much as possible. Perform gentle range of motion to keep the joint mobile. While you are taking the prednisone, take Tylenol for any breakthrough pain.  When she finished the prednisone, you can resume taking ibuprofen. If your symptoms do not improve within the next 7 to 10 days, please follow-up in our clinic to consider imaging at that time. Follow-up as needed.     ED Prescriptions     Medication Sig Dispense Auth. Provider   predniSONE (DELTASONE) 20 MG tablet Take 2 tablets (40 mg total) by mouth daily with breakfast for 5 days. 10 tablet Marquize Seib-Warren, Sadie Haber, NP      PDMP not reviewed this encounter.   Abran Cantor, NP 07/09/21 [phone]

## 2021-07-09 NOTE — Discharge Instructions (Addendum)
Take medication as prescribed. Continue to apply ice to the left hand.  Apply for 20 minutes, remove for 1 hour, then repeat.  Do this is much as possible. Perform gentle range of motion to keep the joint mobile. While you are taking the prednisone, take Tylenol for any breakthrough pain.  When she finished the prednisone, you can resume taking ibuprofen. If your symptoms do not improve within the next 7 to 10 days, please follow-up in our clinic to consider imaging at that time. Follow-up as needed.

## 2021-07-09 NOTE — ED Triage Notes (Signed)
Pt reports swelling and pain in the left hand x 2 days. Denies any injury. Ibuprofen gives no relief.

## 2021-09-10 ENCOUNTER — Ambulatory Visit: Payer: Medicaid Other | Admitting: Neurology

## 2021-10-24 ENCOUNTER — Ambulatory Visit: Payer: Medicaid Other | Admitting: Family Medicine

## 2021-10-24 ENCOUNTER — Encounter: Payer: Self-pay | Admitting: Family Medicine

## 2021-10-24 VITALS — BP 109/67 | HR 76 | Ht 62.0 in | Wt 179.0 lb

## 2021-10-24 DIAGNOSIS — Z1231 Encounter for screening mammogram for malignant neoplasm of breast: Secondary | ICD-10-CM | POA: Diagnosis not present

## 2021-10-24 DIAGNOSIS — E559 Vitamin D deficiency, unspecified: Secondary | ICD-10-CM

## 2021-10-24 DIAGNOSIS — G43019 Migraine without aura, intractable, without status migrainosus: Secondary | ICD-10-CM

## 2021-10-24 DIAGNOSIS — R9089 Other abnormal findings on diagnostic imaging of central nervous system: Secondary | ICD-10-CM

## 2021-10-24 DIAGNOSIS — R4689 Other symptoms and signs involving appearance and behavior: Secondary | ICD-10-CM

## 2021-10-24 DIAGNOSIS — E785 Hyperlipidemia, unspecified: Secondary | ICD-10-CM | POA: Diagnosis not present

## 2021-10-24 MED ORDER — BUTALBITAL-APAP-CAFFEINE 50-325-40 MG PO TABS: 1.0000 | ORAL_TABLET | Freq: Four times a day (QID) | ORAL | 0 refills | Status: DC | PRN

## 2021-10-24 NOTE — Progress Notes (Signed)
   Marissa [name]     MRN: [id number]      DOB: 1980-07-08   HPI Marissa [name] is here for follow up  Still reports recurrent episodes   3 days ago whle sitting had uncontrolable eye blinking, right arm tremors, difficulty standing fell into chair , after finally standing has difficulty walking, feet shuffling, abnd speech, voice hioarse, takes approx 15 mins for normal motor function, sometimes voice takes over 1 hour to returROS Denies recent fever or chills. Denies sinus pressure, nasal congestion, ear pain or sore throat. Denies chest congestion, productive cough or wheezing. Denies chest pains, palpitations and leg swelling Denies abdominal pain, nausea, vomiting,diarrhea or constipation.   Denies dysuria, frequency, hesitancy or incontinence. Denies joint pain, swelling and limitation in mobility. Denies headaches, seizures, numbness, or tingling. Denies depression, uncontrolled anxiety or insomnia. Denies skin break down or rash.   PE  BP 109/67 (BP Location: Right Arm, Patient Position: Sitting)   Pulse 76   Ht 5\' 2"  (1.575 m)   Wt 179 lb (81.2 kg)   SpO2 98%   BMI 32.74 kg/m   Patient alert and oriented and in no cardiopulmonary distress.  HEENT: No facial asymmetry, EOMI,     Neck supple .  Chest: Clear to auscultation bilaterally.  CVS: S1, S2 no murmurs, no S3.Regular rate.  ABD: Soft non tender.   Ext: No edema  MS: Adequate ROM spine, shoulders, hips and knees.  Skin: Intact, no ulcerations or rash noted.  Psych: Good eye contact, normal affect. Memory intact not anxious or depressed appearing.  CNS: CN 2-12 intact, power,  normal throughout.no focal deficits noted.   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

## 2021-10-24 NOTE — Patient Instructions (Addendum)
Annual with pap in January, call if you need me sooner  Labs today already ordered, please add magnesium level  Please schedule mammogram at checkout  I will refer you to [facility] again for ongoing neurology evaluation  Thanks for choosing [facility], we consider it a privelige to serve you.

## 2021-10-25 DIAGNOSIS — R9089 Other abnormal findings on diagnostic imaging of central nervous system: Secondary | ICD-10-CM | POA: Insufficient documentation

## 2021-10-25 LAB — BMP8+EGFR
BUN/Creatinine Ratio: 15 (ref 9–23)
BUN: 12 mg/dL (ref 6–24)
CO2: 24 mmol/L (ref 20–29)
Calcium: 9.3 mg/dL (ref 8.7–10.2)
Chloride: 101 mmol/L (ref 96–106)
Creatinine, Ser: 0.78 mg/dL (ref 0.57–1.00)
Glucose: 77 mg/dL (ref 70–99)
Potassium: 4.3 mmol/L (ref 3.5–5.2)
Sodium: 136 mmol/L (ref 134–144)
eGFR: 98 mL/min/{1.73_m2} (ref >59)

## 2021-10-25 LAB — LIPID PANEL
Chol/HDL Ratio: 2.5 ratio (ref 0.0–4.4)
Cholesterol, Total: 172 mg/dL (ref 100–199)
HDL: 70 mg/dL (ref >39)
LDL Chol Calc (NIH): 94 mg/dL (ref 0–99)
Triglycerides: 39 mg/dL (ref 0–149)
VLDL Cholesterol Cal: 8 mg/dL (ref 5–40)

## 2021-10-25 LAB — VITAMIN D 25 HYDROXY (VIT D DEFICIENCY, FRACTURES): Vit D, 25-Hydroxy: 11.5 ng/mL — ABNORMAL LOW (ref 30.0–100.0)

## 2021-10-25 LAB — CBC
Hematocrit: 36.7 % (ref 34.0–46.6)
Hemoglobin: 12.3 g/dL (ref 11.1–15.9)
MCH: 31.5 pg (ref 26.6–33.0)
MCHC: 33.5 g/dL (ref 31.5–35.7)
MCV: 94 fL (ref 79–97)
Platelets: 203 10*3/uL (ref 150–450)
RBC: 3.9 x10E6/uL (ref 3.77–5.28)
RDW: 12.3 % (ref 11.7–15.4)
WBC: 4.6 10*3/uL (ref 3.4–10.8)

## 2021-10-25 LAB — MAGNESIUM: Magnesium: 1.8 mg/dL (ref 1.6–2.3)

## 2021-10-25 LAB — TSH: TSH: 1.71 u[IU]/mL (ref 0.450–4.500)

## 2021-10-25 MED ORDER — ERGOCALCIFEROL 1.25 MG (50000 UT) PO CAPS: 50000.0000 [IU] | ORAL_CAPSULE | ORAL | 2 refills | Status: DC

## 2021-10-25 NOTE — Assessment & Plan Note (Signed)
Needs supplement 

## 2021-10-25 NOTE — Assessment & Plan Note (Signed)
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

## 2021-10-25 NOTE — Assessment & Plan Note (Signed)
perisitent episodes reported, needs to continue to follow with Neurology, referral entered

## 2021-10-25 NOTE — Assessment & Plan Note (Signed)
Reports response to fioricet when needed

## 2021-11-11 ENCOUNTER — Ambulatory Visit (HOSPITAL_COMMUNITY): Payer: Medicaid Other

## 2021-12-29 DIAGNOSIS — E669 Obesity, unspecified: Secondary | ICD-10-CM | POA: Diagnosis not present

## 2021-12-29 DIAGNOSIS — Z6831 Body mass index (BMI) 31.0-31.9, adult: Secondary | ICD-10-CM | POA: Diagnosis not present

## 2021-12-29 DIAGNOSIS — R051 Acute cough: Secondary | ICD-10-CM | POA: Diagnosis not present

## 2022-02-07 ENCOUNTER — Encounter: Payer: Medicaid Other | Admitting: Family Medicine

## 2022-02-19 ENCOUNTER — Encounter: Payer: Medicaid Other | Admitting: Family Medicine

## 2022-02-21 ENCOUNTER — Other Ambulatory Visit: Payer: Self-pay

## 2022-02-21 ENCOUNTER — Encounter (HOSPITAL_COMMUNITY): Payer: Self-pay | Admitting: Emergency Medicine

## 2022-02-21 ENCOUNTER — Emergency Department (HOSPITAL_COMMUNITY)
Admission: EM | Admit: 2022-02-21 | Discharge: 2022-02-21 | Disposition: A | Discharge status: Home / Self Care | Payer: Medicaid Other | Attending: Emergency Medicine | Admitting: Emergency Medicine

## 2022-02-21 ENCOUNTER — Emergency Department (HOSPITAL_COMMUNITY): Payer: Medicaid Other

## 2022-02-21 DIAGNOSIS — R29818 Other symptoms and signs involving the nervous system: Secondary | ICD-10-CM | POA: Diagnosis not present

## 2022-02-21 DIAGNOSIS — R531 Weakness: Secondary | ICD-10-CM | POA: Insufficient documentation

## 2022-02-21 LAB — BASIC METABOLIC PANEL
Anion gap: 9 (ref 5–15)
BUN: 16 mg/dL (ref 6–20)
CO2: 23 mmol/L (ref 22–32)
Calcium: 9.1 mg/dL (ref 8.9–10.3)
Chloride: 104 mmol/L (ref 98–111)
Creatinine, Ser: 0.87 mg/dL (ref 0.44–1.00)
GFR, Estimated: >60 mL/min (ref >60)
Glucose, Bld: 90 mg/dL (ref 70–99)
Potassium: 3.7 mmol/L (ref 3.5–5.1)
Sodium: 136 mmol/L (ref 135–145)

## 2022-02-21 LAB — HEPATIC FUNCTION PANEL
ALT: 21 U/L (ref 0–44)
AST: 18 U/L (ref 15–41)
Albumin: 4.1 g/dL (ref 3.5–5.0)
Alkaline Phosphatase: 45 U/L (ref 38–126)
Bilirubin, Direct: 0.1 mg/dL (ref 0.0–0.2)
Indirect Bilirubin: 1.2 mg/dL — ABNORMAL HIGH (ref 0.3–0.9)
Total Bilirubin: 1.3 mg/dL — ABNORMAL HIGH (ref 0.3–1.2)
Total Protein: 7.5 g/dL (ref 6.5–8.1)

## 2022-02-21 LAB — CBC
HCT: 38.1 % (ref 36.0–46.0)
Hemoglobin: 12.8 g/dL (ref 12.0–15.0)
MCH: 31.5 pg (ref 26.0–34.0)
MCHC: 33.6 g/dL (ref 30.0–36.0)
MCV: 93.8 fL (ref 80.0–100.0)
Platelets: 211 10*3/uL (ref 150–400)
RBC: 4.06 MIL/uL (ref 3.87–5.11)
RDW: 13.2 % (ref 11.5–15.5)
WBC: 4.8 10*3/uL (ref 4.0–10.5)
nRBC: 0 % (ref 0.0–0.2)

## 2022-02-21 LAB — MAGNESIUM: Magnesium: 2 mg/dL (ref 1.7–2.4)

## 2022-02-21 LAB — URINALYSIS, ROUTINE W REFLEX MICROSCOPIC
Bilirubin Urine: NEGATIVE
Glucose, UA: NEGATIVE mg/dL
Hgb urine dipstick: NEGATIVE
Ketones, ur: NEGATIVE mg/dL
Leukocytes,Ua: NEGATIVE
Nitrite: NEGATIVE
Protein, ur: NEGATIVE mg/dL
Specific Gravity, Urine: 1.02 (ref 1.005–1.030)
pH: 6 (ref 5.0–8.0)

## 2022-02-21 LAB — CBG MONITORING, ED: Glucose-Capillary: 80 mg/dL (ref 70–99)

## 2022-02-21 LAB — PREGNANCY, URINE: Preg Test, Ur: NEGATIVE

## 2022-02-21 MED ORDER — LORAZEPAM 2 MG/ML IJ SOLN
1.0000 mg | Freq: Once | INTRAMUSCULAR | Status: AC
  Administered 2022-02-21: 1 mg via INTRAVENOUS
  Filled 2022-02-21: qty 1

## 2022-02-21 NOTE — ED Provider Notes (Addendum)
[facility] Provider Note   CSN: [id number] Arrival date & time: 02/21/22  1259     History  Chief Complaint  Patient presents with   Weakness    Marissa [name] is a 42 y.o. female.  Patient presenting for episodes that been occurring for months.  With inability to speak and feeling paralyzed in certain areas of the body not being able to walk and jerking movements.  Patient states that she feels paralyzed in her right right leg able to feel touch complaining of legs feeling weak.  Patient stated that this episode started about 3 in the morning.  Usually they are brief for they only last for about an hour.  Patient's primary care doctor is Dr. Moshe Cipro.  Patient had been followed by [facility] neurology in the past saw them in February 2022.  These events been going on for years.  [place] neurology feels that they are a psychosomatic disorder and stress related.  However patient's had abnormal MRIs in the past.  Patient used to a long time ago be followed by [facility] neurology.  She is now being referred again to [facility] neurology by Dr. Moshe Cipro but does not have an appointment with them until April.  Patient states she has a lot of anxiety about MRIs but has no strict contraindication to MRI.  Symptoms today are predominantly right leg and speech difficult patient forms words fine.  But voice is muted.  Past medical history sniffer hyperlipidemia anxiety history of migraines.  Supposedly CVA in September 2017 right frontal area.  Past surgical history significant for patent foramen ovale closure in 2017.  Patient does not use tobacco products.       Home Medications Prior to Admission medications   Medication Sig Start Date End Date Taking? Authorizing Provider  acetaminophen (TYLENOL) 500 MG tablet Take 1,000 mg by mouth every 6 (six) hours as needed for mild pain.   Yes [provider]  ergocalciferol (VITAMIN D2) 1.25 MG (50000 UT)  capsule Take 1 capsule (50,000 Units total) by mouth once a week. One capsule once weekly 10/25/21  Yes Fayrene Helper, MD  butalbital-acetaminophen-caffeine (FIORICET) [phone] MG tablet Take 1 tablet by mouth every 6 (six) hours as needed for headache. Patient not taking: Reported on 02/21/2022 10/24/21   Fayrene Helper, MD      Allergies    Triptans and Morphine and related    Review of Systems   Review of Systems  Constitutional:  Negative for chills and fever.  HENT:  Negative for ear pain and sore throat.   Eyes:  Negative for pain and visual disturbance.  Respiratory:  Negative for cough and shortness of breath.   Cardiovascular:  Negative for chest pain and palpitations.  Gastrointestinal:  Negative for abdominal pain and vomiting.  Genitourinary:  Negative for dysuria and hematuria.  Musculoskeletal:  Negative for arthralgias and back pain.  Skin:  Negative for color change and rash.  Neurological:  Positive for speech difficulty and weakness. Negative for seizures and syncope.  All other systems reviewed and are negative.   Physical Exam Updated Vital Signs BP 110/78 (BP Location: Right Arm)   Pulse 88   Temp 98.9 F (37.2 C)   Resp 16   Ht 1.575 m (5\' 2" )   Wt 81.2 kg   LMP 01/27/2022 (Approximate)   SpO2 99%   BMI 32.74 kg/m  Physical Exam Vitals and nursing note reviewed.  Constitutional:  General: She is not in acute distress.    Appearance: She is well-developed.  HENT:     Head: Normocephalic and atraumatic.  Eyes:     Conjunctiva/sclera: Conjunctivae normal.  Cardiovascular:     Rate and Rhythm: Normal rate and regular rhythm.     Heart sounds: No murmur heard. Pulmonary:     Effort: Pulmonary effort is normal. No respiratory distress.     Breath sounds: Normal breath sounds.  Abdominal:     Palpations: Abdomen is soft.     Tenderness: There is no abdominal tenderness.  Musculoskeletal:        General: No swelling.     Cervical back:  Neck supple.  Skin:    General: Skin is warm and dry.     Capillary Refill: Capillary refill takes less than 2 seconds.  Neurological:     Mental Status: She is alert.     Cranial Nerves: Cranial nerve deficit present.     Motor: Weakness present.  Psychiatric:        Mood and Affect: Mood normal.     ED Results / Procedures / Treatments   Labs (all labs ordered are listed, but only abnormal results are displayed) Labs Reviewed  HEPATIC FUNCTION PANEL - Abnormal; Notable for the following components:      Result Value   Total Bilirubin 1.3 (*)    Indirect Bilirubin 1.2 (*)    All other components within normal limits  BASIC METABOLIC PANEL  CBC  URINALYSIS, ROUTINE W REFLEX MICROSCOPIC  PREGNANCY, URINE  MAGNESIUM  CBG MONITORING, ED    EKG EKG Interpretation  Date/Time:  Friday February 21 2022 13:22:04 EST Ventricular Rate:  94 PR Interval:  160 QRS Duration: 90 QT Interval:  350 QTC Calculation: 437 R Axis:   67 Text Interpretation: Normal sinus rhythm Cannot rule out Anterior infarct , age undetermined Abnormal ECG When compared with ECG of 03-Sep-2020 19:56, PREVIOUS ECG IS PRESENT No significant change since last tracing Confirmed by Fredia Sorrow [phone]) on 02/21/2022 3:19:11 PM  Radiology MR Brain Wo Contrast (neuro protocol)  Result Date: 02/21/2022 CLINICAL DATA:  Neuro deficit, acute, stroke suspected. Additional history provided by the scanning technologist: The patient reports intermittent speech difficulty, feeling "paralyzed" in areas of her body, inability to walk, jerking movements. EXAM: MRI HEAD WITHOUT CONTRAST TECHNIQUE: Multiplanar, multiecho pulse sequences of the brain and surrounding structures were obtained without intravenous contrast. COMPARISON:  Brain MRI 09/04/2020. FINDINGS: Brain: Cerebral volume is normal. Partially empty sella turcica. No cortical encephalomalacia is identified. No significant cerebral white matter disease. There is no  acute infarct. No evidence of an intracranial mass. No chronic intracranial blood products. No extra-axial fluid collection. No midline shift. Vascular: Maintained flow voids within the proximal large arterial vessels. Skull and upper cervical spine: No focal suspicious marrow lesion. Sinuses/Orbits: No mass or acute finding within the imaged orbits. No significant paranasal sinus disease. IMPRESSION: 1.  No evidence of acute intracranial abnormality. 2. Partially empty sella turcica. This finding can reflect incidental anatomic variation, or alternatively, it can be associated with idiopathic intracranial hypertension. 3. Otherwise unremarkable non-contrast MRI appearance of the brain. Electronically Signed   By: Kellie Simmering D.O.   On: 02/21/2022 17:07    Procedures Procedures    Medications Ordered in ED Medications  LORazepam (ATIVAN) injection 1 mg (1 mg Intravenous Given 02/21/22 1625)    ED Course/ Medical Decision Making/ A&P  Medical Decision Making Amount and/or Complexity of Data Reviewed Labs: ordered. Radiology: ordered.  Risk Prescription drug management.   Clinically probably psychosomatic.  Certainly the voice changes do not seem to fit an acute stroke.  But with patient's longstanding history and prior stroke supposedly in 2017 we will get MRI brain.  Will give Ativan to help her through that.  Patient already has follow-up with [facility] neurology planned for April.  Urinalysis negative magnesium hepatic panel pending pregnancy test negative basic metabolic panel is electrolytes completely normal.  CBC no leukocytosis hemoglobin normal EKG without any acute changes really no significant change from previous.  Will get the MRI.  Rule out any structural abnormality.  MRI without any acute findings.  Patient was able to walk to the bathroom here.  Patient has follow-up with [place] neurology in April.  Does not want to follow back up with [facility]  neurology.  Additional neurology workup is warranted.  Neurology in the past that thought the findings were psychosomatic.  However it is not clear whether MS has been completely ruled out.  Final Clinical Impression(s) / ED Diagnoses Final diagnoses:  Weakness    Rx / DC Orders ED Discharge Orders     None         Fredia Sorrow, MD 02/21/22 1555    Fredia Sorrow, MD 02/21/22 1556    Fredia Sorrow, MD 02/21/22 2015

## 2022-02-21 NOTE — Discharge Instructions (Signed)
MRI today without any acute findings.  Recommend you follow up with neurology.  Understand he got an appointment pending with [place] neurology.  In the meantime he can return back to your primary care doctor.  Additional neurology workup is warranted.

## 2022-02-21 NOTE — ED Notes (Signed)
Pt transported to MRI 

## 2022-02-21 NOTE — ED Notes (Signed)
Patient assisted by wheel chair to the restroom and back to room.

## 2022-02-21 NOTE — ED Triage Notes (Signed)
Pt staets has been having episodes for month of not being able to speak, feeling paralyzed to certain areas of body, not being able to walk, jerking movements.  Has been to Dr Moshe Cipro and other ED's for this. Pt states yesterday right foot started turning in and couldn't walk. Today arrived a/o, right leg intermittently jumping, slow to talk and stuttering. Pt answering questions appropriately. Tearful. Denies pain. Denies dizziness. States she feels paralyzed in her right leg. Able to feel touch to both legs. C/o both legs feeling weak. Color wnl.

## 2022-02-24 ENCOUNTER — Encounter: Payer: Self-pay | Admitting: Family Medicine

## 2022-02-28 ENCOUNTER — Ambulatory Visit (INDEPENDENT_AMBULATORY_CARE_PROVIDER_SITE_OTHER): Payer: Medicaid Other | Admitting: Family Medicine

## 2022-02-28 ENCOUNTER — Encounter: Payer: Self-pay | Admitting: Family Medicine

## 2022-02-28 VITALS — BP 126/81 | HR 85 | Ht 62.0 in | Wt 174.1 lb

## 2022-02-28 DIAGNOSIS — Z09 Encounter for follow-up examination after completed treatment for conditions other than malignant neoplasm: Secondary | ICD-10-CM | POA: Diagnosis not present

## 2022-02-28 DIAGNOSIS — R29818 Other symptoms and signs involving the nervous system: Secondary | ICD-10-CM

## 2022-02-28 DIAGNOSIS — R930 Abnormal findings on diagnostic imaging of skull and head, not elsewhere classified: Secondary | ICD-10-CM

## 2022-02-28 DIAGNOSIS — R531 Weakness: Secondary | ICD-10-CM | POA: Diagnosis not present

## 2022-02-28 DIAGNOSIS — R4701 Aphasia: Secondary | ICD-10-CM

## 2022-02-28 DIAGNOSIS — H0259 Other disorders affecting eyelid function: Secondary | ICD-10-CM | POA: Diagnosis not present

## 2022-02-28 DIAGNOSIS — Q2112 Patent foramen ovale: Secondary | ICD-10-CM

## 2022-02-28 DIAGNOSIS — R4689 Other symptoms and signs involving appearance and behavior: Secondary | ICD-10-CM

## 2022-02-28 DIAGNOSIS — R9431 Abnormal electrocardiogram [ECG] [EKG]: Secondary | ICD-10-CM

## 2022-02-28 NOTE — Patient Instructions (Addendum)
Keep appointment in Feb  as before, can change if have Cardiology or Neurology appointment  You are referred urgently to Neurology and Cardiology, these are very important  Thanks for choosing [facility], we consider it a privelige to serve you.

## 2022-02-28 NOTE — Progress Notes (Unsigned)
   MELONI HINZ     MRN: [id number]      DOB: April 23, 1980   HPI Ms. Biby is herewith repeated episodes of abnormal cociousness, inabilityto move, every day  since 02/02, at least 2 per day, 2 hrs long still doing everything worknetc  ROS Denies recent fever or chills. Denies sinus pressure, nasal congestion, ear pain or sore throat. Denies chest congestion, productive cough or wheezing. Denies chest pains, palpitations and leg swelling Denies abdominal pain, nausea, vomiting,diarrhea or constipation.   Denies dysuria, frequency, hesitancy or incontinence. Denies joint pain, swelling and limitation in mobility. Denies headaches, seizures, numbness, or tingling. Denies depression, anxiety or insomnia. Denies skin break down or rash.   PE  BP 126/81 (BP Location: Right Arm, Patient Position: Sitting, Cuff Size: Normal)   Pulse 85   Ht 5\' 2"  (1.575 m)   Wt 174 lb 1.3 oz (79 kg)   LMP 01/27/2022 (Approximate)   SpO2 99%   BMI 31.84 kg/m   Patient alert and oriented and in no cardiopulmonary distress.  HEENT: No facial asymmetry, EOMI,     Neck supple .  Chest: Clear to auscultation bilaterally.  CVS: S1, S2 no murmurs, no S3.Regular rate.  ABD: Soft non tender.   Ext: No edema  MS: Adequate ROM spine, shoulders, hips and knees.  Skin: Intact, no ulcerations or rash noted.  Psych: Good eye contact, normal affect. Memory intact not anxious or depressed appearing.  CNS: CN 2-12 intact, power,  normal throughout.no focal deficits noted.   Assessment & Plan  ***

## 2022-03-03 ENCOUNTER — Encounter: Payer: Self-pay | Admitting: Family Medicine

## 2022-03-03 DIAGNOSIS — R9431 Abnormal electrocardiogram [ECG] [EKG]: Secondary | ICD-10-CM | POA: Insufficient documentation

## 2022-03-03 DIAGNOSIS — Z09 Encounter for follow-up examination after completed treatment for conditions other than malignant neoplasm: Secondary | ICD-10-CM

## 2022-03-03 HISTORY — DX: Encounter for follow-up examination after completed treatment for conditions other than malignant neoplasm: Z09

## 2022-03-03 NOTE — Assessment & Plan Note (Signed)
Will re establish with Cardiology, recent EKG in the ED abnormal, and cardiology evaluation indicated, report states unable to r/o anterior infarct

## 2022-03-03 NOTE — Assessment & Plan Note (Signed)
Recent EKG in ED, cannot r/o anterior infarct, refer to cardiology, has episodic weaknss and ppseudo paralysis, h/o PFO, cardiology eval indicated

## 2022-03-03 NOTE — Assessment & Plan Note (Signed)
Episodes where incapable of movement, difficulty with speech, excessive blinking and seeming reduced responsiveness, needs Neurology

## 2022-03-03 NOTE — Assessment & Plan Note (Signed)
Increasingly frequent episodes which are debilitating, needs neurology eval and management, soonest availble appt at [facility] where she was most recently seen

## 2022-03-03 NOTE — Assessment & Plan Note (Signed)
Patient in for follow up of recent ED visit Discharge summary, and laboratory and radiology data are reviewed, and any questions or concerns  are discussed. Specific issues requiring follow up are specifically addressed.

## 2022-03-14 ENCOUNTER — Encounter: Payer: Self-pay | Admitting: Family Medicine

## 2022-03-14 ENCOUNTER — Ambulatory Visit (INDEPENDENT_AMBULATORY_CARE_PROVIDER_SITE_OTHER): Payer: Medicaid Other | Admitting: Family Medicine

## 2022-03-14 VITALS — BP 118/70 | HR 92 | Resp 16 | Ht 62.0 in | Wt 174.0 lb

## 2022-03-14 DIAGNOSIS — F321 Major depressive disorder, single episode, moderate: Secondary | ICD-10-CM | POA: Diagnosis not present

## 2022-03-14 DIAGNOSIS — G43019 Migraine without aura, intractable, without status migrainosus: Secondary | ICD-10-CM

## 2022-03-14 DIAGNOSIS — R4689 Other symptoms and signs involving appearance and behavior: Secondary | ICD-10-CM | POA: Diagnosis not present

## 2022-03-14 DIAGNOSIS — J309 Allergic rhinitis, unspecified: Secondary | ICD-10-CM | POA: Diagnosis not present

## 2022-03-14 MED ORDER — MONTELUKAST SODIUM 10 MG PO TABS: 10.0000 mg | ORAL_TABLET | Freq: Every day | ORAL | 3 refills | Status: DC

## 2022-03-14 MED ORDER — BUTALBITAL-APAP-CAFFEINE 50-325-40 MG PO TABS: ORAL_TABLET | ORAL | 0 refills | Status: DC

## 2022-03-14 NOTE — Progress Notes (Unsigned)
   Marissa [name]     MRN: [id number]      DOB: [birth year]   HPI Marissa [name] is here increasingly debilitatesd, c/o increased frequency of headaches in past 4 weeks, fioricet does help c/o c/o blurry vision and double vision at times , sometimes has has difficulty with speech, walking  and also has difficulty feeding herself Has appointments fr both Cardiology, and Neurology, anxious to get sooner appointment with Neurology Cries often, feels as if she is at her wits end, will not "give up"but reports it is becoming more challenge to function Though scheduled for pap , states she gets them elsewhere so visit is changed to f/u C/o cough and increased sinus fullness and head pressure  x 1 day, thinks it is allergy related ,  ROS Denies recent fever or chills. Denies  ear pain or sore throat. Denies chest congestion, productive cough or wheezing. Denies chest pains, palpitations and leg swelling Denies abdominal pain, nausea, vomiting,diarrhea or constipation.   Denies dysuria, frequency, hesitancy or incontinence. . Denies skin break down or rash.   PE  BP 118/70   Pulse 92   Resp 16   Ht 5' 2"$  (1.575 m)   Wt 174 lb (78.9 kg)   LMP 01/27/2022 (Approximate)   SpO2 96%   BMI 31.83 kg/m   Patient alert and oriented and in no cardiopulmonary distress.  HEENT: No facial asymmetry, EOMI,     Neck supple .No sinus tenderness, nasal congestion present  Chest: Clear to auscultation bilaterally.  CVS: S1, S2 no murmurs, no S3.Regular rate.  .   Ext: No edema  MS: Adequate ROM spine, shoulders, hips and knees.  Skin: Intact, no ulcerations or rash noted.  Psych: Good eye contact, normal affect. Memory intact mildly  anxious not  depressed appearing.Mildly tearful at times  CNS: CN 2-12 intact, power,  normal throughout.no focal deficits noted.   Assessment & Plan  Common migraine with intractable migraine Reports good response to Fioricet and requests refill, notes  increased headache frequency in past 2 months, though states not linked to episodes  which she has of altered mental status and inability to speak, and pseudo paralysis  Spell of abnormal behavior Reports these are increasing in frequency, anxious to resume neurology eval, I recommend calling to see if there is a cancellation Advised her to stop driving based on symptoms she is reporting  Depression, major, single episode, moderate ([place]) Refer  for therapy  Allergic rhinitis Uncontrolled symptoms, reports OTC meds not helpful, montelukast prescribed

## 2022-03-14 NOTE — Patient Instructions (Signed)
Follow-up in 4 months, call if you need me sooner.  Fioricet tablets are prescribed for headache when needed maximum twice weekly use please.  Montelukast is prescribed for uncontrolled allergy symptoms which are now developing.  Please call the cancellation list at [facility] from time to time to see if you can get a sooner appointment with urology.  Based on the symptoms you report I recommend that you do not drive and have someone drive you.  I am referring you for therapy.  Please do schedule your Pap this is overdue.  Thanks for choosing [facility], we consider it a privelige to serve you.

## 2022-03-16 ENCOUNTER — Encounter: Payer: Self-pay | Admitting: Family Medicine

## 2022-03-16 DIAGNOSIS — J309 Allergic rhinitis, unspecified: Secondary | ICD-10-CM | POA: Insufficient documentation

## 2022-03-16 DIAGNOSIS — F321 Major depressive disorder, single episode, moderate: Secondary | ICD-10-CM | POA: Insufficient documentation

## 2022-03-16 NOTE — Progress Notes (Incomplete)
   DIM CARDI     MRN: [id number]      DOB: 05-30-1980   HPI Marissa [name] is here increasingly debilitatesd, c/o increased frequency of headaches in past 4 weeks, fioricet does help c/o c/o blurry vision and double vision at times , sometimes has has difficulty with speech, walking  and also has difficulty feeding herself Has appointments fr both Cardiology, and Neurology, anxious to get sooner appointment with Neurology Cries often, feels as if she is at her wits end, will not "give up"but reports it is becoming more challenge to function Though scheduled for pap , states she gets them elsewhere so viisit is changed to f/u C/o cough and increase dsinis fullness and head pressure  x 1 day, thinks it is allergy replated   ROS Denies recent fever or chills. Denies sinus pressure, nasal congestion, ear pain or sore throat. Denies chest congestion, productive cough or wheezing. Denies chest pains, palpitations and leg swelling Denies abdominal pain, nausea, vomiting,diarrhea or constipation.   Denies dysuria, frequency, hesitancy or incontinence. Denies joint pain, swelling and limitation in mobility. Denies headaches, seizures, numbness, or tingling. Denies depression, anxiety or insomnia. Denies skin break down or rash.   PE  BP 118/70   Pulse 92   Resp 16   Ht 5' 2"$  (1.575 m)   Wt 174 lb (78.9 kg)   LMP 01/27/2022 (Approximate)   SpO2 96%   BMI 31.83 kg/m   Patient alert and oriented and in no cardiopulmonary distress.  HEENT: No facial asymmetry, EOMI,     Neck supple .  Chest: Clear to auscultation bilaterally.  CVS: S1, S2 no murmurs, no S3.Regular rate.  ABD: Soft non tender.   Ext: No edema  MS: Adequate ROM spine, shoulders, hips and knees.  Skin: Intact, no ulcerations or rash noted.  Psych: Good eye contact, normal affect. Memory intact not anxious or depressed appearing.  CNS: CN 2-12 intact, power,  normal throughout.no focal deficits  noted.   Assessment & Plan  No problem-specific Assessment & Plan notes found for this encounter.

## 2022-03-16 NOTE — Assessment & Plan Note (Signed)
Uncontrolled symptoms, reports OTC meds not helpful, montelukast prescribed

## 2022-03-16 NOTE — Assessment & Plan Note (Signed)
Reports good response to Fioricet and requests refill, notes increased headache frequency in past 2 months, though states not linked to episodes  which she has of altered mental status and inability to speak, and pseudo paralysis

## 2022-03-16 NOTE — Assessment & Plan Note (Signed)
Reports these are increasing in frequency, anxious to resume neurology eval, I recommend calling to see if there is a cancellation Advised her to stop driving based on symptoms she is reporting

## 2022-03-16 NOTE — Assessment & Plan Note (Signed)
Refer  for therapy

## 2022-03-20 ENCOUNTER — Encounter: Payer: Self-pay | Admitting: Radiology

## 2022-03-25 ENCOUNTER — Encounter: Payer: Self-pay | Admitting: Internal Medicine

## 2022-03-25 ENCOUNTER — Ambulatory Visit: Payer: Medicaid Other | Attending: Internal Medicine | Admitting: Internal Medicine

## 2022-03-25 VITALS — BP 112/60 | HR 76 | Ht 62.0 in | Wt 174.5 lb

## 2022-03-25 DIAGNOSIS — Q2112 Patent foramen ovale: Secondary | ICD-10-CM | POA: Diagnosis not present

## 2022-03-25 DIAGNOSIS — Z8774 Personal history of (corrected) congenital malformations of heart and circulatory system: Secondary | ICD-10-CM

## 2022-03-25 NOTE — Patient Instructions (Addendum)
Medication Instructions:  Your physician recommends that you continue on your current medications as directed. Please refer to the Current Medication list given to you today.  Labwork: none  Testing/Procedures: none  Follow-Up: Your physician recommends that you schedule a follow-up appointment in: as needed  Any Other Special Instructions Will Be Listed Below (If Applicable). Please get a smart watch to monitor for abnormal heart rhythm   If you need a refill on your cardiac medications before your next appointment, please call your pharmacy.

## 2022-03-25 NOTE — Progress Notes (Addendum)
Cardiology Office Note  Date: 03/25/2022   ID: Marissa [name], DOB [birth year], MRN [id number]  PCP:  Fayrene Helper, MD  Cardiologist:  Chalmers Guest, MD Electrophysiologist:  None   Reason for Office Visit: Evaluation of abnormal EKG at the request of Dr. Moshe Cipro   History of Present Illness: Marissa [name] is a 42 y.o. 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  EKG from 02/21/2022 showed normal sinus rhythm and cannot rule out anterior infarct. I personally reviewed the EKG which showed normal sinus rhythm and no ST-T changes.  EKG today showed normal sinus rhythm and no ST changes as well. She has been following with neurology for weakness symptoms on the right side of her body and was deemed to have somatic symptoms. MRI brain performed currently was unremarkable.  Neurology stopped her aspirin for CVA indication. She was previously on Lipitor in 2017 when she had CVA but had myalgias and self discontinued it.  Past Medical History:  Diagnosis Date   Allergy    seasonal   Anxiety    Back pain with right-sided radiculopathy 12/12/2020   Cerebrovascular accident (CVA) due to embolism ([place]) 10/08/2015   R  frontal   Common migraine with intractable migraine 11/08/2015   Encounter for examination following treatment at hospital 03/03/2022   Encounter for gynecological examination with Papanicolaou smear of cervix 12/30/2018   Hyperlipidemia    Stroke ([place])    Vitamin D deficiency     Past Surgical History:  Procedure Laterality Date   CESAREAN SECTION     PATENT FORAMEN OVALE CLOSURE  09/25/2015   TUBAL LIGATION      Current Outpatient Medications  Medication Sig Dispense Refill   acetaminophen (TYLENOL) 500 MG tablet Take 1,000 mg by mouth every 6 (six) hours as needed for mild pain.     butalbital-acetaminophen-caffeine (FIORICET) 50-325-40 MG  tablet Take one tablet by mouth twice daily, as needed, for headache. Maximum is twice per week 20 tablet 0   ergocalciferol (VITAMIN D2) 1.25 MG (50000 UT) capsule Take 1 capsule (50,000 Units total) by mouth once a week. One capsule once weekly 12 capsule 2   montelukast (SINGULAIR) 10 MG tablet Take 1 tablet (10 mg total) by mouth at bedtime. 30 tablet 3   No current facility-administered medications for this visit.   Allergies:  Triptans and Morphine and related   Social History: The patient  reports that she has never smoked. She has never used smokeless tobacco. She reports that she does not drink alcohol and does not use drugs.   Family History: The patient's family history includes COPD in her maternal grandfather; Cancer in her maternal grandfather; Heart Problems in her maternal grandmother; Heart attack in her maternal uncle; Heart disease in her maternal aunt, maternal grandmother, and maternal uncle; Hypertension in her mother.   ROS:  Please see the history of present illness. Otherwise, complete review of systems is positive for none.  All other systems are reviewed and negative.   Physical Exam: VS:  BP 112/60 (BP Location: Right Arm, Patient Position: Sitting, Cuff Size: Large)   Pulse 76   Ht '5\' 2"'$  (1.575 m)   Wt 174 lb 8 oz (79.2 kg)   SpO2 98%   BMI 31.92 kg/m , BMI Body mass index is 31.92 kg/m.  Wt Readings from Last 3 Encounters:  03/25/22  174 lb 8 oz (79.2 kg)  03/14/22 174 lb (78.9 kg)  02/28/22 174 lb 1.3 oz (79 kg)    General: Patient appears comfortable at rest. HEENT: Conjunctiva and lids normal, oropharynx clear with moist mucosa. Neck: Supple, no elevated JVP or carotid bruits, no thyromegaly. Lungs: Clear to auscultation, nonlabored breathing at rest. Cardiac: Regular rate and rhythm, no S3 or significant systolic murmur, no pericardial rub. Abdomen: Soft, nontender, no hepatomegaly, bowel sounds present, no guarding or rebound. Extremities: No  pitting edema, distal pulses 2+. Skin: Warm and dry. Musculoskeletal: No kyphosis. Neuropsychiatric: Alert and oriented x3, affect grossly appropriate.  ECG:  An ECG dated 02/21/2022 and 03/25/2022 was personally reviewed today and demonstrated:  Normal sinus rhythm, no ST changes  Recent Labwork: 10/24/2021: TSH 1.710 02/21/2022: ALT 21; AST 18; BUN 16; Creatinine, Ser 0.87; Hemoglobin 12.8; Magnesium 2.0; Platelets 211; Potassium 3.7; Sodium 136     Component Value Date/Time   CHOL 172 10/24/2021 1127   TRIG 39 10/24/2021 1127   HDL 70 10/24/2021 1127   CHOLHDL 2.5 10/24/2021 1127   CHOLHDL 2.4 08/13/2020 0414   VLDL 6 08/13/2020 0414   LDLCALC 94 10/24/2021 1127   [place] 94 09/05/2019 1154    Other Studies Reviewed Today:   Assessment and Plan: Patient is a 42 year old F known to have 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  # Abnormal EKG -I reviewed EKGs on the EMR from 02/2022 and 03/2022 which showed normal sinus rhythm and has no ST-T changes. Patient worried about 02/21/2022 EKG, I reassured the patient that nothing is abnormal on the EKG and the computer reading is not definitive.  # s/p PFO closure with 18 mm Amplatzer device in 2017 -Patient had R frontal stroke in 2017, had PFO on echocardiogram and underwent successful PFO closure with 18 mm Amplatzer device in 2017.  She was supposed to be on DAPT for 6 months after the procedure but self discontinued Plavix earlier than 6 months. No indication of aspirin 81 mg once daily due to presence of Amplatzer device.  # History of R frontal CVA in 2017 # Somatic symptoms -Follows up with neurology  I have spent a total of 45 minutes with patient reviewing chart, EKGs, labs and examining patient as well as establishing an assessment and plan that was discussed with the patient.  > 50% of time was spent in direct patient care.       Medication Adjustments/Labs and Tests Ordered: Current medicines are reviewed at length with the patient today.  Concerns regarding medicines are outlined above.   Tests Ordered: No orders of the defined types were placed in this encounter.   Medication Changes: No orders of the defined types were placed in this encounter.   Disposition:  Follow up prn  Signed Maya Scholer Fidel Levy, MD, 03/25/2022 11:14 AM    [facility] [place], [place], [place] [id number]

## 2022-04-28 ENCOUNTER — Encounter (HOSPITAL_COMMUNITY): Payer: Self-pay

## 2022-04-28 ENCOUNTER — Emergency Department (HOSPITAL_COMMUNITY)
Admission: EM | Admit: 2022-04-28 | Discharge: 2022-04-29 | Disposition: A | Discharge status: Home / Self Care | Payer: Medicaid Other | Attending: Emergency Medicine | Admitting: Emergency Medicine

## 2022-04-28 ENCOUNTER — Other Ambulatory Visit: Payer: Self-pay

## 2022-04-28 ENCOUNTER — Emergency Department (HOSPITAL_COMMUNITY): Payer: Medicaid Other

## 2022-04-28 DIAGNOSIS — R0602 Shortness of breath: Secondary | ICD-10-CM | POA: Insufficient documentation

## 2022-04-28 DIAGNOSIS — R202 Paresthesia of skin: Secondary | ICD-10-CM | POA: Diagnosis not present

## 2022-04-28 DIAGNOSIS — R531 Weakness: Secondary | ICD-10-CM | POA: Insufficient documentation

## 2022-04-28 DIAGNOSIS — R569 Unspecified convulsions: Secondary | ICD-10-CM | POA: Diagnosis not present

## 2022-04-28 DIAGNOSIS — R29818 Other symptoms and signs involving the nervous system: Secondary | ICD-10-CM | POA: Diagnosis not present

## 2022-04-28 DIAGNOSIS — R069 Unspecified abnormalities of breathing: Secondary | ICD-10-CM | POA: Diagnosis not present

## 2022-04-28 DIAGNOSIS — R000 Tachycardia, unspecified: Secondary | ICD-10-CM | POA: Diagnosis not present

## 2022-04-28 LAB — COMPREHENSIVE METABOLIC PANEL
ALT: 31 U/L (ref 0–44)
AST: 30 U/L (ref 15–41)
Albumin: 3.7 g/dL (ref 3.5–5.0)
Alkaline Phosphatase: 51 U/L (ref 38–126)
Anion gap: 8 (ref 5–15)
BUN: 16 mg/dL (ref 6–20)
CO2: 22 mmol/L (ref 22–32)
Calcium: 8.8 mg/dL — ABNORMAL LOW (ref 8.9–10.3)
Chloride: 104 mmol/L (ref 98–111)
Creatinine, Ser: 1.01 mg/dL — ABNORMAL HIGH (ref 0.44–1.00)
GFR, Estimated: >60 mL/min (ref >60)
Glucose, Bld: 107 mg/dL — ABNORMAL HIGH (ref 70–99)
Potassium: 3.3 mmol/L — ABNORMAL LOW (ref 3.5–5.1)
Sodium: 134 mmol/L — ABNORMAL LOW (ref 135–145)
Total Bilirubin: 0.6 mg/dL (ref 0.3–1.2)
Total Protein: 7 g/dL (ref 6.5–8.1)

## 2022-04-28 LAB — PROTIME-INR
INR: 1 (ref 0.8–1.2)
Prothrombin Time: 12.8 seconds (ref 11.4–15.2)

## 2022-04-28 LAB — CBC
HCT: 35.9 % — ABNORMAL LOW (ref 36.0–46.0)
Hemoglobin: 12 g/dL (ref 12.0–15.0)
MCH: 31.4 pg (ref 26.0–34.0)
MCHC: 33.4 g/dL (ref 30.0–36.0)
MCV: 94 fL (ref 80.0–100.0)
Platelets: 215 10*3/uL (ref 150–400)
RBC: 3.82 MIL/uL — ABNORMAL LOW (ref 3.87–5.11)
RDW: 13 % (ref 11.5–15.5)
WBC: 5.6 10*3/uL (ref 4.0–10.5)
nRBC: 0 % (ref 0.0–0.2)

## 2022-04-28 LAB — ETHANOL: Alcohol, Ethyl (B): <10 mg/dL (ref <10)

## 2022-04-28 LAB — APTT: aPTT: 28 seconds (ref 24–36)

## 2022-04-28 LAB — DIFFERENTIAL
Abs Immature Granulocytes: 0.01 10*3/uL (ref 0.00–0.07)
Basophils Absolute: 0 10*3/uL (ref 0.0–0.1)
Basophils Relative: 0 %
Eosinophils Absolute: 0.1 10*3/uL (ref 0.0–0.5)
Eosinophils Relative: 2 %
Immature Granulocytes: 0 %
Lymphocytes Relative: 54 %
Lymphs Abs: 3.1 10*3/uL (ref 0.7–4.0)
Monocytes Absolute: 0.4 10*3/uL (ref 0.1–1.0)
Monocytes Relative: 7 %
Neutro Abs: 2 10*3/uL (ref 1.7–7.7)
Neutrophils Relative %: 37 %

## 2022-04-28 MED ORDER — DIAZEPAM 5 MG/ML IJ SOLN
2.5000 mg | Freq: Once | INTRAMUSCULAR | Status: DC
  Filled 2022-04-28: qty 2

## 2022-04-28 NOTE — ED Triage Notes (Signed)
Pt presents with [facility] with complaints of SOB and weakness that started prior to arrival. Pt was slumped over and not replying but was alert per EMS. Pt got on truck and complained of increased SOB and some difficulty swallowing. Hx of TIAs. Pt is alert and answering questions appropriately.

## 2022-04-28 NOTE — Progress Notes (Signed)
2218 elert for activation of code stroke 2223 down to scan 2230 back form scan 2231 paged tsmd 2236 tsmd on screen 2242 NCCT results to tsmd  LKW 4.8.2024 1130 per mom Nettie Elm) when on screen with [facility].

## 2022-04-28 NOTE — ED Notes (Addendum)
Pt mom is at bedside and states that patient started acting different yesterday around 1130 am. Pt mom states today around 4 she started having worsening symptoms. Pt mom states that this has been going on for a long time and that she may be fine one day and then no the next.

## 2022-04-28 NOTE — Consult Note (Signed)
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  Recommend reassurance and referral to psychiatry, referral to [facility] appropriate.  Sign Out:       Discussed with Emergency Department Provider    ------------------------------------------------------------------------------  Advanced Imaging: Advanced Imaging Deferred because:  Stroke not suspected with clinical presentation and exam   Metrics: Last Known Well: 04/28/2022 11:30:00 [facility] Notification Time: 04/28/2022 22:31:00 Arrival Time: 04/28/2022 21:57:00 Stamp Time: 04/28/2022 22:31:00 Initial Response Time: 04/28/2022 22:35:49 Symptoms: left sided weakness, speech changes . Initial patient interaction: 04/28/2022 22:39:35 NIHSS Assessment Completed: 04/28/2022 22:48:24 Patient is not a candidate for Thrombolytic. Thrombolytic Medical Decision: 04/28/2022 22:48:26 Patient was not deemed candidate for Thrombolytic because of following reasons: Last Well Known Above 4.5 Hours.  CT head showed no acute hemorrhage or acute core infarct.  Primary Provider Notified of Diagnostic Impression and Management Plan on: 04/28/2022 23:11:12    ------------------------------------------------------------------------------  History of Present Illness: Patient is a 41  year old Female.  Patient was brought by EMS for symptoms of left sided weakness, speech changes . The patient has a h/o migraines, htn and a month ago began having episodes approximately every other day where she has weakness on one side of her body and difficulty talking. Per her mother on each occasion a new symptom seems to be added. She was seen a month ago with question of Functional neurologic disorder. She had normal MRI brain Feb 2024 and has been referred to [facility] for further evaluation. Her mother states that after 11:30 today her daughter told her that "she had another episode" and was having difficulty with her left side. She denies a headache today.    Past Medical History:      Hypertension      Migraine Headaches      There is no history of Diabetes Mellitus  Medications:  No Anticoagulant use  No Antiplatelet use Reviewed EMR for current medications  Allergies:  Reviewed  Social History: Drug Use: No  Family History:  There is no family history of premature cerebrovascular disease pertinent to this consultation  ROS : 14 Points Review of Systems was performed and was negative except mentioned in HPI.  Past Surgical History: There Is No Surgical History Contributory To Today's Visit     Examination: BP(134/84), Pulse(76), Blood Glucose(99) 1A: Level of Consciousness - Alert; keenly responsive + 0 1B: Ask Month and Age - Both Questions Right + 0 1C: Blink Eyes & Squeeze Hands - Performs Both Tasks + 0 2: Test Horizontal Extraocular Movements - Normal + 0 3: Test Visual Fields - No Visual Loss + 0 4: Test Facial Palsy (Use Grimace if Obtunded) - Normal symmetry + 0 5A: Test Left Arm Motor Drift - Drift, hits bed + 2 5B: Test Right Arm Motor Drift - No Drift for 10 Seconds + 0 6A: Test Left Leg Motor Drift - Drift,  but doesn't hit bed + 1 6B: Test Right Leg Motor Drift - No Drift for 5 Seconds + 0 7: Test Limb Ataxia (FNF/Heel-Shin) - No Ataxia + 0 8:  Test Sensation - Normal; No sensory loss + 0 9: Test Language/Aphasia - Normal; No aphasia + 0 10: Test Dysarthria - Mild-Moderate Dysarthria: Slurring but can be understood + 1 11: Test Extinction/Inattention - No abnormality + 0  NIHSS Score: 4  NIHSS Free Text : whispering, high pitched effortful speech  fluctuating exam, distractible, excessively slow finger to nose and language exam.  Pre-Morbid Modified Rankin Scale: 0 Points = No symptoms at all  Spoke with : Dr. Edilia Bo  Patient/Family was informed the Neurology Consult would occur via TeleHealth consult by way of interactive audio and video telecommunications and consented to receiving care in this manner.   Patient is being evaluated for possible acute neurologic impairment and high probability of imminent or life-threatening deterioration. I spent total of 46 minutes providing care to this patient, including time for face to face visit via telemedicine, review of medical records, imaging studies and discussion of findings with providers, the patient and/or family.   Dr Rutherford Guys   [facility] For Inpatient follow-up with [facility] physician please call [facility] [phone]. This is not an outpatient service. Post hospital discharge, please contact hospital directly.  Please do not communicate with [facility] physicians via secure chat. If you have any questions, Please contact [facility]. Please call or reconsult our service if there are any clinical or diagnostic changes.

## 2022-04-28 NOTE — ED Notes (Signed)
SPOK paged @ 2220

## 2022-04-28 NOTE — ED Provider Notes (Signed)
[place] EMERGENCY DEPARTMENT AT [facility] Provider Note   CSN: [id number] Arrival date & time: 04/28/22  2157  An emergency department physician performed an initial assessment on this suspected stroke patient at 2215.  History  Chief Complaint  Patient presents with   Code Stroke    Marissa [name] is a 42 y.o. female.   Shortness of Breath Patient presents for shortness of breath and left-sided weakness.  Onset was 4 PM today.  Patient reports that she will have "episodes" that are similar in nature.  She reports history of CVA several years ago.  She denies current use of antiplatelet or anticoagulation medication.  She states that she did undergo repair of PFO.  Currently, she denies any areas of pain.  She does not have any areas of numbness.  Any areas of weakness include left arm and left leg.  She does have voice change.  She endorses difficulty swallowing.     Home Medications Prior to Admission medications   Medication Sig Start Date End Date Taking? Authorizing Provider  acetaminophen (TYLENOL) 500 MG tablet Take 1,000 mg by mouth every 6 (six) hours as needed for mild pain.    [provider]  butalbital-acetaminophen-caffeine (FIORICET) 50-325-40 MG tablet Take one tablet by mouth twice daily, as needed, for headache. Maximum is twice per week 03/14/22   Kerri Perches, MD  ergocalciferol (VITAMIN D2) 1.25 MG (50000 UT) capsule Take 1 capsule (50,000 Units total) by mouth once a week. One capsule once weekly 10/25/21   Kerri Perches, MD  montelukast (SINGULAIR) 10 MG tablet Take 1 tablet (10 mg total) by mouth at bedtime. 03/14/22   Kerri Perches, MD      Allergies    Triptans and Morphine and related    Review of Systems   Review of Systems  HENT:  Positive for trouble swallowing and voice change.   Respiratory:  Positive for shortness of breath.   Neurological:  Positive for weakness.    Physical Exam Updated Vital  Signs BP 134/84   Pulse 79   Resp 18   Ht 5\' 2"  (1.575 m)   Wt 74.8 kg   SpO2 97%   BMI 30.18 kg/m  Physical Exam Vitals and nursing note reviewed.  Constitutional:      General: She is not in acute distress.    Appearance: Normal appearance. She is well-developed. She is not ill-appearing, toxic-appearing or diaphoretic.  HENT:     Head: Normocephalic and atraumatic.     Right Ear: External ear normal.     Left Ear: External ear normal.     Nose: Nose normal.     Mouth/Throat:     Mouth: Mucous membranes are moist.     Pharynx: Oropharynx is clear.  Eyes:     Extraocular Movements: Extraocular movements intact.     Conjunctiva/sclera: Conjunctivae normal.  Cardiovascular:     Rate and Rhythm: Normal rate and regular rhythm.  Pulmonary:     Effort: Pulmonary effort is normal. No respiratory distress.     Breath sounds: Normal breath sounds. No wheezing or rales.  Chest:     Chest wall: No tenderness.  Abdominal:     General: There is no distension.     Palpations: Abdomen is soft.     Tenderness: There is no abdominal tenderness.  Musculoskeletal:        General: No swelling or deformity.     Cervical back: Normal range of motion  and neck supple.     Right lower leg: No edema.     Left lower leg: No edema.  Skin:    General: Skin is warm and dry.     Capillary Refill: Capillary refill takes less than 2 seconds.     Coloration: Skin is not jaundiced or pale.  Neurological:     Mental Status: She is alert and oriented to person, place, and time.     Cranial Nerves: No cranial nerve deficit.     Sensory: No sensory deficit.     Motor: Weakness (3/5 strength in left upper and lower extremity.) present.  Psychiatric:        Mood and Affect: Mood is anxious.        Speech: Speech normal.        Behavior: Behavior normal. Behavior is cooperative.     ED Results / Procedures / Treatments   Labs (all labs ordered are listed, but only abnormal results are  displayed) Labs Reviewed  CBC - Abnormal; Notable for the following components:      Result Value   RBC 3.82 (*)    HCT 35.9 (*)    All other components within normal limits  COMPREHENSIVE METABOLIC PANEL - Abnormal; Notable for the following components:   Sodium 134 (*)    Potassium 3.3 (*)    Glucose, Bld 107 (*)    Creatinine, Ser 1.01 (*)    Calcium 8.8 (*)    All other components within normal limits  ETHANOL  PROTIME-INR  APTT  DIFFERENTIAL  RAPID URINE DRUG SCREEN, HOSP PERFORMED  URINALYSIS, ROUTINE W REFLEX MICROSCOPIC  TSH  T3, FREE  T4, FREE  CBG MONITORING, ED  I-STAT CHEM 8, ED  POC URINE PREG, ED    EKG EKG Interpretation  Date/Time:  Monday April 28 2022 22:10:06 EDT Ventricular Rate:  79 PR Interval:  167 QRS Duration: 116 QT Interval:  392 QTC Calculation: 450 R Axis:   54 Text Interpretation: Sinus rhythm Nonspecific intraventricular conduction delay Confirmed by Gloris Manchesterixon, Chason Mciver [phone]) on 04/28/2022 11:09:41 PM  Radiology CT HEAD CODE STROKE WO CONTRAST  Result Date: 04/28/2022 CLINICAL DATA:  Code stroke. Initial evaluation for neuro deficit, stroke. EXAM: CT HEAD WITHOUT CONTRAST TECHNIQUE: Contiguous axial images were obtained from the base of the skull through the vertex without intravenous contrast. RADIATION DOSE REDUCTION: This exam was performed according to the departmental dose-optimization program which includes automated exposure control, adjustment of the mA and/or kV according to patient size and/or use of iterative reconstruction technique. COMPARISON:  Prior study from 02/21/2022. FINDINGS: Brain: Cerebral volume within normal limits. No acute intracranial hemorrhage. No acute large vessel territory infarct. No mass lesion or midline shift. No hydrocephalus or extra-axial fluid collection. Vascular: No abnormal hyperdense vessel. Skull: Scalp soft tissues and calvarium demonstrate no acute finding. Sinuses/Orbits: Globes and orbital soft tissues  within normal limits. Visualized paranasal sinuses are clear. No mastoid effusion. Other: None. ASPECTS [facility]([place] Stroke Program Early CT Score) - Ganglionic level infarction (caudate, lentiform nuclei, internal capsule, insula, M1-M3 cortex): 7 - Supraganglionic infarction (M4-M6 cortex): 3 Total score (0-10 with 10 being normal): 10 IMPRESSION: 1. Negative head CT.  No acute intracranial abnormality. 2. ASPECTS is 10. Results were called by telephone at the time of interpretation on 04/28/2022 at 10:46 pm to provider Gloris ManchesterYAN Aleyna Cueva , who verbally acknowledged these results. Electronically Signed   By: Rise MuBenjamin  McClintock M.D.   On: 04/28/2022 22:46    Procedures Procedures  Medications Ordered in ED Medications - No data to display  ED Course/ Medical Decision Making/ A&P                             Medical Decision Making Amount and/or Complexity of Data Reviewed Labs: ordered. Radiology: ordered.  Risk Prescription drug management.   This patient presents to the ED for concern of shortness of breath and left hemibody weakness, this involves an extensive number of treatment options, and is a complaint that carries with it a high risk of complications and morbidity.  The differential diagnosis includes CVA, complex migraine, seizure, conversion disorder   Co morbidities that complicate the patient evaluation  PFO s/p closure, CVA, TIA, spells of abnormal behavior, depression, anxiety, HLD, migraine headaches   Additional history obtained:  Additional history obtained from patient's mother External records from outside source obtained and reviewed including EMR   Lab Tests:  I Ordered, and personally interpreted labs.  The pertinent results include: Mild hypokalemia, normal hemoglobin, no leukocytosis   Imaging Studies ordered:  I ordered imaging studies including CT head I independently visualized and interpreted imaging which showed no acute findings I agree with the  radiologist interpretation   Cardiac Monitoring: / EKG:  The patient was maintained on a cardiac monitor.  I personally viewed and interpreted the cardiac monitored which showed an underlying rhythm of: Sinus rhythm   Consultations Obtained:  I requested consultation with the neurologist, Dr. Ether Griffins,  and discussed lab and imaging findings as well as pertinent plan - they recommend: History consistent with functional disorder, no further stroke workup   Problem List / ED Course / Critical interventions / Medication management  Patient presents for shortness of breath and left-sided weakness.  She states that she has "episodes" of similar symptoms.  Areas of weakness will alternate between left and right.  Onset today was 4 PM.  On arrival, vital signs are normal.  Patient is speaking in hoarse, whispered voice.  Oropharynx is widely patent.  Breathing is unlabored.  On neurologic exam, she has left hemibody weakness.  Patient does have documented history of CVA.  She states that this was several years ago.  She was diagnosed with a PFO but underwent closure.  She is not currently on any type platelet or anticoagulant medications.  Given her acute focal weakness, code stroke was initiated.  Patient underwent CT imaging which did not show acute findings.  She underwent evaluation by neurologist over telehealth.  Neurologist received corroborating information from mother.  Patient reportedly has these episodes every other day.  Neurology does not feel that she needs any further stroke workup or admission.  On reassessment, patient has recurrence of left hemibody motor function.  She continues to speak in a soft whispered voice.  She was given something to drink and did not have any difficulty swallowing.  She does request discharge home at this time.  She is set up for outpatient follow-up with neurology at [facility].  Patient's mother requested thyroid studies which were ordered.  She was advised to  follow-up on results through MyChart or PCP.  She was able to tolerate p.o. intake.  Prior to be discharged, she did have a recurrence of a "episode".  Care of patient was signed out to oncoming ED provider.   Social Determinants of Health:  Has access to outpatient care        Final Clinical Impression(s) / ED Diagnoses Final diagnoses:  Left-sided weakness    Rx / DC Orders ED Discharge Orders     None         Gloris Manchester, MD 04/29/22 [phone]

## 2022-04-28 NOTE — ED Notes (Signed)
Patient transported to CT 

## 2022-04-29 LAB — TSH: TSH: 6.746 u[IU]/mL — ABNORMAL HIGH (ref 0.350–4.500)

## 2022-04-29 LAB — CBG MONITORING, ED: Glucose-Capillary: 99 mg/dL (ref 70–99)

## 2022-04-29 NOTE — Progress Notes (Signed)
Code stroke  Call time  1019 Beeper time  1021 Start   1029 End   1033 Soc   1033 Epic   [phone] Rad called  [phone]

## 2022-04-29 NOTE — ED Provider Notes (Signed)
2:16 AM Assumed care from Dr. Durwin Nora, please see their note for full history, physical and decision making until this point. In brief this is a 42 y.o. year old female who presented to the ED tonight with Code Stroke     Not a stroke. Keeps having different complaints that are not objectively substantiated. Pending reevaluation for disposition.   Nursing informed me that patient said she was ready to go and was discharged.   Discharge instructions, including strict return precautions for new or worsening symptoms, given. Patient and/or family verbalized understanding and agreement with the plan as described.   Labs, studies and imaging reviewed by myself and considered in medical decision making if ordered. Imaging interpreted by radiology.  Labs Reviewed  CBC - Abnormal; Notable for the following components:      Result Value   RBC 3.82 (*)    HCT 35.9 (*)    All other components within normal limits  COMPREHENSIVE METABOLIC PANEL - Abnormal; Notable for the following components:   Sodium 134 (*)    Potassium 3.3 (*)    Glucose, Bld 107 (*)    Creatinine, Ser 1.01 (*)    Calcium 8.8 (*)    All other components within normal limits  ETHANOL  PROTIME-INR  APTT  DIFFERENTIAL  RAPID URINE DRUG SCREEN, HOSP PERFORMED  URINALYSIS, ROUTINE W REFLEX MICROSCOPIC  TSH  T3, FREE  T4, FREE  CBG MONITORING, ED  I-STAT CHEM 8, ED  POC URINE PREG, ED    CT HEAD CODE STROKE WO CONTRAST  Final Result      No follow-ups on file.    Remy Dia, Barbara Cower, MD 04/29/22 [phone]

## 2022-04-29 NOTE — ED Notes (Signed)
Pt's mother states pt is having difficulty breathing.  Pt is resting in bed, once pulse ox placed on her left hand her arm began to twitch shaking the pulse ox off. Equal bilateral rise & fall of chest, no nostril flaring noted, oxygen saturation 100% on room air.  EDP made aware.

## 2022-04-30 ENCOUNTER — Emergency Department (HOSPITAL_COMMUNITY)
Admission: EM | Admit: 2022-04-30 | Discharge: 2022-04-30 | Disposition: A | Discharge status: Home / Self Care | Payer: Medicaid Other | Attending: Emergency Medicine | Admitting: Emergency Medicine

## 2022-04-30 ENCOUNTER — Telehealth: Payer: Self-pay | Admitting: Family Medicine

## 2022-04-30 ENCOUNTER — Emergency Department (HOSPITAL_COMMUNITY): Payer: Medicaid Other

## 2022-04-30 ENCOUNTER — Other Ambulatory Visit: Payer: Self-pay

## 2022-04-30 DIAGNOSIS — F444 Conversion disorder with motor symptom or deficit: Secondary | ICD-10-CM

## 2022-04-30 DIAGNOSIS — R4701 Aphasia: Secondary | ICD-10-CM | POA: Diagnosis not present

## 2022-04-30 DIAGNOSIS — R251 Tremor, unspecified: Secondary | ICD-10-CM | POA: Insufficient documentation

## 2022-04-30 DIAGNOSIS — R29818 Other symptoms and signs involving the nervous system: Secondary | ICD-10-CM | POA: Diagnosis not present

## 2022-04-30 DIAGNOSIS — R531 Weakness: Secondary | ICD-10-CM | POA: Insufficient documentation

## 2022-04-30 DIAGNOSIS — R4781 Slurred speech: Secondary | ICD-10-CM | POA: Diagnosis not present

## 2022-04-30 DIAGNOSIS — Z8673 Personal history of transient ischemic attack (TIA), and cerebral infarction without residual deficits: Secondary | ICD-10-CM | POA: Insufficient documentation

## 2022-04-30 DIAGNOSIS — R29810 Facial weakness: Secondary | ICD-10-CM | POA: Insufficient documentation

## 2022-04-30 DIAGNOSIS — F447 Conversion disorder with mixed symptom presentation: Secondary | ICD-10-CM | POA: Diagnosis not present

## 2022-04-30 DIAGNOSIS — R259 Unspecified abnormal involuntary movements: Secondary | ICD-10-CM | POA: Diagnosis not present

## 2022-04-30 LAB — COMPREHENSIVE METABOLIC PANEL
ALT: 30 U/L (ref 0–44)
AST: 25 U/L (ref 15–41)
Albumin: 3.9 g/dL (ref 3.5–5.0)
Alkaline Phosphatase: 48 U/L (ref 38–126)
Anion gap: 9 (ref 5–15)
BUN: 17 mg/dL (ref 6–20)
CO2: 18 mmol/L — ABNORMAL LOW (ref 22–32)
Calcium: 9.1 mg/dL (ref 8.9–10.3)
Chloride: 107 mmol/L (ref 98–111)
Creatinine, Ser: 0.92 mg/dL (ref 0.44–1.00)
GFR, Estimated: >60 mL/min (ref >60)
Glucose, Bld: 104 mg/dL — ABNORMAL HIGH (ref 70–99)
Potassium: 3.3 mmol/L — ABNORMAL LOW (ref 3.5–5.1)
Sodium: 134 mmol/L — ABNORMAL LOW (ref 135–145)
Total Bilirubin: 1 mg/dL (ref 0.3–1.2)
Total Protein: 7.1 g/dL (ref 6.5–8.1)

## 2022-04-30 LAB — CBC
HCT: 37.8 % (ref 36.0–46.0)
Hemoglobin: 12.9 g/dL (ref 12.0–15.0)
MCH: 31.7 pg (ref 26.0–34.0)
MCHC: 34.1 g/dL (ref 30.0–36.0)
MCV: 92.9 fL (ref 80.0–100.0)
Platelets: 206 10*3/uL (ref 150–400)
RBC: 4.07 MIL/uL (ref 3.87–5.11)
RDW: 13 % (ref 11.5–15.5)
WBC: 6 10*3/uL (ref 4.0–10.5)
nRBC: 0 % (ref 0.0–0.2)

## 2022-04-30 LAB — URINALYSIS, ROUTINE W REFLEX MICROSCOPIC
Bilirubin Urine: NEGATIVE
Glucose, UA: NEGATIVE mg/dL
Hgb urine dipstick: NEGATIVE
Ketones, ur: NEGATIVE mg/dL
Leukocytes,Ua: NEGATIVE
Nitrite: NEGATIVE
Protein, ur: NEGATIVE mg/dL
Specific Gravity, Urine: 1.011 (ref 1.005–1.030)
pH: 8 (ref 5.0–8.0)

## 2022-04-30 LAB — DIFFERENTIAL
Abs Immature Granulocytes: 0.02 10*3/uL (ref 0.00–0.07)
Basophils Absolute: 0 10*3/uL (ref 0.0–0.1)
Basophils Relative: 0 %
Eosinophils Absolute: 0.1 10*3/uL (ref 0.0–0.5)
Eosinophils Relative: 1 %
Immature Granulocytes: 0 %
Lymphocytes Relative: 40 %
Lymphs Abs: 2.4 10*3/uL (ref 0.7–4.0)
Monocytes Absolute: 0.3 10*3/uL (ref 0.1–1.0)
Monocytes Relative: 6 %
Neutro Abs: 3.2 10*3/uL (ref 1.7–7.7)
Neutrophils Relative %: 53 %

## 2022-04-30 LAB — RAPID URINE DRUG SCREEN, HOSP PERFORMED
Amphetamines: NOT DETECTED
Barbiturates: NOT DETECTED
Benzodiazepines: NOT DETECTED
Cocaine: NOT DETECTED
Opiates: NOT DETECTED
Tetrahydrocannabinol: NOT DETECTED

## 2022-04-30 LAB — ETHANOL: Alcohol, Ethyl (B): <10 mg/dL (ref <10)

## 2022-04-30 LAB — APTT: aPTT: 26 seconds (ref 24–36)

## 2022-04-30 LAB — POC URINE PREG, ED: Preg Test, Ur: NEGATIVE

## 2022-04-30 LAB — PROTIME-INR
INR: 1 (ref 0.8–1.2)
Prothrombin Time: 13.1 seconds (ref 11.4–15.2)

## 2022-04-30 MED ORDER — LORAZEPAM 2 MG/ML IJ SOLN
1.0000 mg | Freq: Once | INTRAMUSCULAR | Status: AC | PRN
  Administered 2022-04-30: 1 mg via INTRAVENOUS
  Filled 2022-04-30: qty 1

## 2022-04-30 MED ORDER — TENECTEPLASE FOR STROKE
PACK | INTRAVENOUS | Status: AC
  Filled 2022-04-30: qty 10

## 2022-04-30 NOTE — Telephone Encounter (Signed)
New message    The patient friend no name given called and stated the patient was C/o having trouble breathing and recent emergency room visit for stroke-like symptoms.  Advise the patient's friend that Dr. Lodema Hong does not have any openings today. Due to her sympathy to call 911 or go back to the nearest emergency room.  The caller disconnected the call.

## 2022-04-30 NOTE — Consult Note (Signed)
[facility] Telemedicine Consult   Requesting Provider: Gerhard Munch Consult Participants: Myself, bedside nurses Hilary Hertz, atrium nurse Misty Location of the provider: [facility] hospital Location of the patient: [facility]  This consult was provided via telemedicine with 2-way video and audio communication. The patient/family was informed that care would be provided in this way and agreed to receive care in this manner.    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 Teresa Coombs in February 2023, with second opinion appointment scheduled for tomorrow with another neurologist.  She has had repeated presentations for fluctuating weakness at times involving both legs and at other times unilateral, numbness, gait difficulty, eye blinking, speech difficulty, lightheadedness.  The length frequency and severity of the episodes has fluctuated over time but these episodes have been ongoing since at least 2017 based on chart review  She has previously not tolerated 81 mg of aspirin daily because she felt it made her "feel cold"  LKW: Sunday  Thrombolytic given?: No, Out of the window Checklist of contradindications was reviewed.  Risks, benefits and alternatives were discussed IR Thrombectomy? No, exam not cw LVO Modified Rankin Scale: 1-No significant post stroke disability and can perform usual duties with stroke symptoms Time of teleneurologist evaluation: 11:02 AM  Exam: Vitals:   04/30/22 1100 04/30/22 1102  BP: (!) 147/95   Pulse:  98  Resp:  13  SpO2:  100%    General: Somewhat flat affect, mildly anxious but cooperative Pulmonary: breathing  comfortably Cardiac: regular rate and rhythm on monitor   NIH Stroke scale 1A: Level of Consciousness - 0 1B: Ask Month and Age - 0 1C: 'Blink Eyes' & 'Squeeze Hands' - 0 2: Test Horizontal Extraocular Movements - 0 3: Test Visual Fields - 0 4: Test Facial Palsy - 0 inconsistent left-sided facial weakness at rest, right side does not elevate as well with smiling but again this is inconsistent 5A: Test Left Arm Motor Drift - 1 drift downwards without pronation with upward drift of the right arm 5B: Test Right Arm Motor Drift - 0 6A: Test Left Leg Motor Drift - 1 6B: Test Right Leg Motor Drift - 0 7: Test Limb Ataxia - 0 ataxia in proportion to drift of the left upper extremity 8: Test Sensation - 0 reports when initially reported numbness has improved at the time of my evaluation 9: Test Language/Aphasia- 0 10: Test Dysarthria - 1 mildly slurred speech at times 11: Test Extinction/Inattention - 0 NIHSS score: 3   Imaging Reviewed:   MRI brain 02/21/2022 CLINICAL DATA:  Neuro deficit, acute, stroke suspected. Additional history provided by the scanning technologist: The patient reports intermittent speech difficulty, feeling "paralyzed" in areas of her body, inability to walk, jerking movements.   1.  No evidence of acute intracranial abnormality. 2. Partially empty sella turcica. This finding can reflect incidental anatomic variation, or alternatively, it can be associated with idiopathic intracranial hypertension. 3. Otherwise unremarkable non-contrast MRI appearance of the brain.  08/13/2020 personally reviewed, agree with radiology:   MRI brain: 1. The patient was unable to tolerate the full examination. As a result, only axial and coronal diffusion-weighted imaging, and a sagittal T1 weighted sequence, could be obtained. 2. 3 mm focus of restricted diffusion within  the medial right temporal lobe/hippocampus, compatible with acute infarction.   MRA head: 1. No  intracranial large vessel occlusion or proximal high-grade arterial stenosis. 2. 1-2 mm inferomedially projecting vascular protrusion arising from the distal cavernous left ICA, which may reflect an aneurysm or infundibulum.   09/04/2020 MRI brain personally reviewed, agree with radiology:   Incomplete study due to patient claustrophobia as above. SWI and axial T1 images were not obtained. Within this confine: No acute intracranial pathology.  Labs reviewed in epic and pertinent values follow:  Basic Metabolic Panel: Recent Labs  Lab 04/28/22 05-16-2130 04/30/22 1057  NA 134* 134*  K 3.3* 3.3*  CL 104 107  CO2 22 18*  GLUCOSE 107* 104*  BUN 16 17  CREATININE 1.01* 0.92  CALCIUM 8.8* 9.1    CBC: Recent Labs  Lab 04/28/22 05/16/2130 04/30/22 1057  WBC 5.6 6.0  NEUTROABS 2.0 3.2  HGB 12.0 12.9  HCT 35.9* 37.8  MCV 94.0 92.9  PLT 215 206    Coagulation Studies: Recent Labs    04/28/22 05/16/30 04/30/22 1057  LABPROT 12.8 13.1  INR 1.0 1.0    Assessment: Overall presentation and nonorganic findings even by video examination most consistent with functional neurological disorder.  [email or web address] handout was reviewed with patient and link to handout was given to ED provider so that a printout can be given to the patient.  She was appreciative of this information.  However given prior MRI reports of diffusion restriction lesions, I do think repeat MRI is reasonable to exclude any acute intracranial abnormality.  If this is negative she can continue to have close outpatient follow-up with her outpatient neurologist.    Recommendations:  -MRI brain without contrast -If this is positive for acute stroke would admit for stroke workup, otherwise outpatient neurology follow-up -Appreciate management of shortness of breath and any other medical complaints per ED  This patient is receiving care for possible acute neurological changes. There was 40 minutes of care by this provider at the time  of service, including time for direct evaluation via telemedicine, review of medical records, imaging studies and discussion of findings with providers, the patient and/or family.   Brooke Dare MD-PhD [facility] [phone]   If 8pm-8am, please page neurology on call as listed in AMION.  CRITICAL CARE Performed by: Gordy Councilman   Total critical care time: 40 minutes  Critical care time was exclusive of separately billable procedures and treating other patients.  Critical care was necessary to treat or prevent imminent or life-threatening deterioration -emergent evaluation for consideration of thrombectomy or TNK  Critical care was time spent personally by me on the following activities: development of treatment plan with patient and/or surrogate as well as nursing, discussions with consultants, evaluation of patient's response to treatment, examination of patient, obtaining history from patient or surrogate, ordering and performing treatments and interventions, ordering and review of laboratory studies, ordering and review of radiographic studies, pulse oximetry and re-evaluation of patient's condition.

## 2022-04-30 NOTE — ED Triage Notes (Signed)
Pt BIB by EMS for Code Stroke. Pt LKW 10AM, Right sided facial droop/weakness. Pt has hx of TIA

## 2022-04-30 NOTE — Telephone Encounter (Deleted)
Error

## 2022-04-30 NOTE — Discharge Instructions (Signed)
As discussed, with your episodic weakness, facial asymmetry, is importantly follow-up with your [name] neurology team.  Do not hesitate to return here for concerning changes in your condition.

## 2022-04-30 NOTE — Progress Notes (Addendum)
Code stroke cart activated at 1057. Tele neuro paged at 1058. Pt had already been to CT. Dr Iver Nestle on screen at 1102. 1107 Dr Iver Nestle said No TNK. Dr Iver Nestle off screen at 1125.  Ricci Barker, Tele Stroke RN

## 2022-04-30 NOTE — ED Provider Notes (Signed)
EMERGENCY DEPARTMENT AT [facility] Provider Note   CSN: [id number] Arrival date & time: 04/30/22  1048  An emergency department physician performed an initial assessment on this suspected stroke patient at 1047.  History  Chief Complaint  Patient presents with   Code Stroke    Marissa [name] is a 42 y.o. female.  HPI Patient presents via EMS designated as a code stroke. Patient is initially accompanied by those individuals, but subsequently her family member.  Patient developed changes 45 minutes prior to ED arrival.  Changes notable for left facial droop, left arm weakness. Reportedly the patient has a history of TIA.  Patient was moderately hypertensive in transport, otherwise unremarkable according to EMS. Patient denies pain.  She notes, similarly, that she is not taking aspirin, nor Plavix.    Home Medications Prior to Admission medications   Medication Sig Start Date End Date Taking? Authorizing Provider  butalbital-acetaminophen-caffeine (FIORICET) 50-325-40 MG tablet Take one tablet by mouth twice daily, as needed, for headache. Maximum is twice per week Patient taking differently: Take 1 tablet by mouth 2 (two) times daily as needed for headache or migraine. Maximum is twice per week 03/14/22  Yes Kerri PerchesSimpson, Margaret E, MD  ergocalciferol (VITAMIN D2) 1.25 MG (50000 UT) capsule Take 1 capsule (50,000 Units total) by mouth once a week. One capsule once weekly Patient not taking: Reported on 04/30/2022 10/25/21   Kerri PerchesSimpson, Margaret E, MD      Allergies    Triptans and Morphine and related    Review of Systems   Review of Systems  Unable to perform ROS: Acuity of condition    Physical Exam Updated Vital Signs BP (!) 147/95   Pulse 87   Temp 98.8 F (37.1 C) (Oral)   Resp (!) 26   SpO2 100%  Physical Exam Vitals and nursing note reviewed.  Constitutional:      General: She is not in acute distress.    Appearance: She is well-developed.   HENT:     Head: Normocephalic and atraumatic.  Eyes:     Conjunctiva/sclera: Conjunctivae normal.  Cardiovascular:     Rate and Rhythm: Normal rate and regular rhythm.  Pulmonary:     Effort: Pulmonary effort is normal. No respiratory distress.     Breath sounds: Normal breath sounds. No stridor.  Abdominal:     General: There is no distension.  Skin:    General: Skin is warm and dry.  Neurological:     Mental Status: She is alert and oriented to person, place, and time.     Cranial Nerves: Facial asymmetry present.     Motor: Tremor present.     Comments: Patient holding her mouth to the right, but able to exhale with symmetric face.  Speech is minimal, but clear. Left arm similarly volitional, patient has tremor when asked to raise it, and when asked to hold it against gravity, does not drop it on her head.  Psychiatric:        Mood and Affect: Mood normal.     ED Results / Procedures / Treatments   Labs (all labs ordered are listed, but only abnormal results are displayed) Labs Reviewed  COMPREHENSIVE METABOLIC PANEL - Abnormal; Notable for the following components:      Result Value   Sodium 134 (*)    Potassium 3.3 (*)    CO2 18 (*)    Glucose, Bld 104 (*)    All other components within normal limits  URINALYSIS, ROUTINE W REFLEX MICROSCOPIC - Abnormal; Notable for the following components:   Color, Urine STRAW (*)    All other components within normal limits  ETHANOL  PROTIME-INR  APTT  CBC  DIFFERENTIAL  RAPID URINE DRUG SCREEN, HOSP PERFORMED  POC URINE PREG, ED  I-STAT CHEM 8, ED    EKG EKG Interpretation  Date/Time:  Wednesday April 30 2022 11:01:50 EDT Ventricular Rate:  97 PR Interval:  165 QRS Duration: 102 QT Interval:  353 QTC Calculation: 449 R Axis:   64 Text Interpretation: Sinus rhythm Abnormal R-wave progression, early transition Confirmed by Gerhard Munch [phone]) on 04/30/2022 11:45:45 AM  Radiology MR BRAIN WO CONTRAST  Result  Date: 04/30/2022 CLINICAL DATA:  Neuro deficit, acute, stroke suspected. EXAM: MRI HEAD WITHOUT CONTRAST TECHNIQUE: Multiplanar, multiecho pulse sequences of the brain and surrounding structures were obtained without intravenous contrast. COMPARISON:  Head CT earlier same day.  MRI 02/21/2022 FINDINGS: Brain: The brain has a normal appearance without evidence of malformation, atrophy, old or acute small or large vessel infarction, mass lesion, hemorrhage, hydrocephalus or extra-axial collection. Vascular: Major vessels at the base of the brain show flow. Venous sinuses appear patent. Skull and upper cervical spine: Normal. Sinuses/Orbits: Clear/normal. Other: None significant. IMPRESSION: Normal examination. No abnormality seen to explain the presenting symptoms. Electronically Signed   By: Paulina Fusi M.D.   On: 04/30/2022 12:36   CT HEAD CODE STROKE WO CONTRAST  Result Date: 04/30/2022 CLINICAL DATA:  Code stroke.  Neuro deficit, acute, stroke suspected EXAM: CT HEAD WITHOUT CONTRAST TECHNIQUE: Contiguous axial images were obtained from the base of the skull through the vertex without intravenous contrast. RADIATION DOSE REDUCTION: This exam was performed according to the departmental dose-optimization program which includes automated exposure control, adjustment of the mA and/or kV according to patient size and/or use of iterative reconstruction technique. COMPARISON:  CT head 04/28/2022. FINDINGS: Brain: No evidence of acute large vascular territory infarction, hemorrhage, hydrocephalus, extra-axial collection or mass lesion/mass effect. Vascular: No hyperdense vessel. Skull: No acute fracture Sinuses/Orbits: Clear sinuses.  No acute orbital findings. ASPECTS [facility] Stroke Program Early CT Score) total score (0-10 with 10 being normal): 10. IMPRESSION: 1. No evidence of acute intracranial abnormality. 2. ASPECTS is 10. Code stroke imaging results were communicated on 04/30/2022 at 10:57 am to provider Dr.  Jeraldine Loots via telephone, who verbally acknowledged these results. Electronically Signed   By: Feliberto Harts M.D.   On: 04/30/2022 10:59   CT HEAD CODE STROKE WO CONTRAST  Result Date: 04/28/2022 CLINICAL DATA:  Code stroke. Initial evaluation for neuro deficit, stroke. EXAM: CT HEAD WITHOUT CONTRAST TECHNIQUE: Contiguous axial images were obtained from the base of the skull through the vertex without intravenous contrast. RADIATION DOSE REDUCTION: This exam was performed according to the departmental dose-optimization program which includes automated exposure control, adjustment of the mA and/or kV according to patient size and/or use of iterative reconstruction technique. COMPARISON:  Prior study from 02/21/2022. FINDINGS: Brain: Cerebral volume within normal limits. No acute intracranial hemorrhage. No acute large vessel territory infarct. No mass lesion or midline shift. No hydrocephalus or extra-axial fluid collection. Vascular: No abnormal hyperdense vessel. Skull: Scalp soft tissues and calvarium demonstrate no acute finding. Sinuses/Orbits: Globes and orbital soft tissues within normal limits. Visualized paranasal sinuses are clear. No mastoid effusion. Other: None. ASPECTS [facility] Stroke Program Early CT Score) - Ganglionic level infarction (caudate, lentiform nuclei, internal capsule, insula, M1-M3 cortex): 7 - Supraganglionic infarction (M4-M6 cortex): 3 Total score (0-10  with 10 being normal): 10 IMPRESSION: 1. Negative head CT.  No acute intracranial abnormality. 2. ASPECTS is 10. Results were called by telephone at the time of interpretation on 04/28/2022 at 10:46 pm to provider Gloris Manchester , who verbally acknowledged these results. Electronically Signed   By: Rise Mu M.D.   On: 04/28/2022 22:46    Procedures Procedures    Medications Ordered in ED Medications  tenecteplase (TNKASE) 50 MG injection for Stroke (  Not Given 04/30/22 1118)  LORazepam (ATIVAN) injection 1 mg (1 mg  Intravenous Given 04/30/22 1200)    ED Course/ Medical Decision Making/ A&P                             Medical Decision Making Adult female with a history of TIA reportedly, MRI in the distant past with questionable stroke versus artifact, and evaluation 2 days ago for somewhat similar presentation of presents with facial asymmetry, left arm weakness, designated as a code stroke.  Evaluation conducted expeditiously with stat CT which I discussed with radiology, negative for hemorrhage, obvious stroke.  Patient's other evaluation concerning for TIA versus stroke, versus functional neurologic changes.  Case discussed at length with neurology as well.   Amount and/or Complexity of Data Reviewed Independent Historian: EMS External Data Reviewed: notes. Labs: ordered. Decision-making details documented in ED Course. Radiology: ordered and independent interpretation performed. Decision-making details documented in ED Course. ECG/medicine tests: ordered and independent interpretation performed. Decision-making details documented in ED Course.  Risk Prescription drug management. Decision regarding hospitalization.  Cardiac 90 sinus normal Pulse ox 100% room air normal  11:46 AM I discussed the patient's case with the neurologist, with concern for possible MRI evidence of stroke in the distant past, patient will have that study performed now, on exam the patient herself is in no distress, face is now symmetric. 1:56 PM Patient in no distress, awake, alert, face is symmetric, speech is clear.  With her mother present I discussed all findings again including MRI without evidence for stroke.  In essence this adult female presents for the second time this week, with another similar episode of left-sided changes, possible facial droop, but is objectively neurologically unremarkable.  As per most recent evaluation some suspicion for functional neurologic disorder, the patient has scheduled follow-up  at [facility] in 5 days.  No evidence for concurrent infection, she is not hypoxic, has no increased work of breathing.  No fever, no hypotension.  Patient discharged in stable condition.        Final Clinical Impression(s) / ED Diagnoses Final diagnoses:  Weakness    Rx / DC Orders ED Discharge Orders     None         Gerhard Munch, MD 04/30/22 1357

## 2022-04-30 NOTE — Telephone Encounter (Signed)
FYI.Patient spouse called said the EMS has picked up patient he asked for Dr Lodema Hong meet at the hospital she knows the patient.  Patient hung up the phone again

## 2022-04-30 NOTE — Progress Notes (Signed)
2 large hoops and 2 small hoop earrings removed from patient and place in zip loc bag and given to patient's nurse.

## 2022-04-30 NOTE — Progress Notes (Signed)
Code stroke times 1046 call time 1048 beeper time 1054 exam started 1055 exam finished 1056 exam completed in [facility] 1054 [facility] Radiology called

## 2022-05-01 ENCOUNTER — Encounter: Payer: Self-pay | Admitting: Family Medicine

## 2022-05-01 DIAGNOSIS — R4781 Slurred speech: Secondary | ICD-10-CM | POA: Diagnosis not present

## 2022-05-01 DIAGNOSIS — R4789 Other speech disturbances: Secondary | ICD-10-CM | POA: Diagnosis not present

## 2022-05-01 LAB — CBG MONITORING, ED: Glucose-Capillary: 76 mg/dL (ref 70–99)

## 2022-05-01 NOTE — Telephone Encounter (Signed)
ED visit was reviewed while pt was there  I have directly sen a message to the pt today

## 2022-05-02 NOTE — Telephone Encounter (Signed)
Note printed and mychart msg sent to pt with appt info

## 2022-05-05 DIAGNOSIS — R404 Transient alteration of awareness: Secondary | ICD-10-CM | POA: Diagnosis not present

## 2022-05-05 DIAGNOSIS — F447 Conversion disorder with mixed symptom presentation: Secondary | ICD-10-CM | POA: Diagnosis not present

## 2022-05-05 DIAGNOSIS — R4189 Other symptoms and signs involving cognitive functions and awareness: Secondary | ICD-10-CM | POA: Diagnosis not present

## 2022-05-06 ENCOUNTER — Telehealth: Payer: Self-pay | Admitting: Family Medicine

## 2022-05-06 NOTE — Telephone Encounter (Signed)
See mychart msg sent per patient

## 2022-05-06 NOTE — Telephone Encounter (Signed)
Called in on pt behalf wants a call back in regard to pt

## 2022-05-07 ENCOUNTER — Ambulatory Visit: Payer: Medicaid Other | Admitting: Family Medicine

## 2022-05-07 DIAGNOSIS — R569 Unspecified convulsions: Secondary | ICD-10-CM | POA: Diagnosis not present

## 2022-05-07 DIAGNOSIS — F447 Conversion disorder with mixed symptom presentation: Secondary | ICD-10-CM | POA: Diagnosis not present

## 2022-05-08 DIAGNOSIS — R569 Unspecified convulsions: Secondary | ICD-10-CM | POA: Diagnosis not present

## 2022-05-08 DIAGNOSIS — F447 Conversion disorder with mixed symptom presentation: Secondary | ICD-10-CM | POA: Diagnosis not present

## 2022-05-09 DIAGNOSIS — F447 Conversion disorder with mixed symptom presentation: Secondary | ICD-10-CM | POA: Diagnosis not present

## 2022-05-13 ENCOUNTER — Ambulatory Visit: Payer: Medicaid Other | Admitting: Family Medicine

## 2022-05-13 ENCOUNTER — Encounter: Payer: Self-pay | Admitting: Family Medicine

## 2022-05-13 VITALS — BP 130/80 | HR 81 | Ht 62.0 in | Wt 177.0 lb

## 2022-05-13 DIAGNOSIS — G43019 Migraine without aura, intractable, without status migrainosus: Secondary | ICD-10-CM | POA: Diagnosis not present

## 2022-05-13 DIAGNOSIS — F447 Conversion disorder with mixed symptom presentation: Secondary | ICD-10-CM | POA: Diagnosis not present

## 2022-05-13 DIAGNOSIS — R479 Unspecified speech disturbances: Secondary | ICD-10-CM

## 2022-05-13 DIAGNOSIS — E559 Vitamin D deficiency, unspecified: Secondary | ICD-10-CM

## 2022-05-13 MED ORDER — ERGOCALCIFEROL 1.25 MG (50000 UT) PO CAPS: 50000.0000 [IU] | ORAL_CAPSULE | ORAL | 2 refills | Status: DC

## 2022-05-13 NOTE — Patient Instructions (Addendum)
F/U in 3 months, call if you need me sooner  Work excuse from last day you worked for 6 months will be provided. Length of time may differ , depending on your progress  Keep in close touch with Neurology and Doctors treating you at [facility]  YOu need someone with you all the time  No driving now  If you have uncontrolled seizure or could be in danger to yourself or someone, then you need to be taken urgently to the nearest emergenc y room  I elieve that you will get better once a diagnosis is made and treatment goes into effect  Thanks for choosing [facility], we consider it a privelige to serve you.

## 2022-05-14 ENCOUNTER — Telehealth: Payer: Self-pay | Admitting: Family Medicine

## 2022-05-14 NOTE — Telephone Encounter (Signed)
FMLA   Copied Noted Sleeved  

## 2022-05-15 ENCOUNTER — Encounter: Payer: Self-pay | Admitting: Family Medicine

## 2022-05-15 NOTE — Telephone Encounter (Signed)
Patient called and her social worker asked to if provider will write a note saying the after visit summary is not good enough needs a letter stating out of work starting 04.09.2024 for 6 months that will not return back to work. Call patient at [phone]. Patient was seen day before yesterday.

## 2022-05-15 NOTE — Telephone Encounter (Signed)
Note up front for patient to pick up patient is aware

## 2022-05-15 NOTE — Telephone Encounter (Signed)
Letter at front for pick up patient aware.

## 2022-05-20 ENCOUNTER — Encounter: Payer: Self-pay | Admitting: Family Medicine

## 2022-05-21 ENCOUNTER — Telehealth: Payer: Self-pay | Admitting: Family Medicine

## 2022-05-21 ENCOUNTER — Encounter: Payer: Self-pay | Admitting: Family Medicine

## 2022-05-21 DIAGNOSIS — F447 Conversion disorder with mixed symptom presentation: Secondary | ICD-10-CM | POA: Insufficient documentation

## 2022-05-21 NOTE — Telephone Encounter (Signed)
See mychart msg

## 2022-05-21 NOTE — Assessment & Plan Note (Signed)
Reporting worsening symptoms, at risk being left alone, incapable of working and driving Work excuse for an initial 6 month period which may vary depending on clinical condition, FMLA paperwork to be completed once available. Family is awarethat she needs to be taken to the nearest ED if she is at risk of danger to herself or of someone

## 2022-05-21 NOTE — Assessment & Plan Note (Signed)
Currently on once weekly supplement

## 2022-05-21 NOTE — Assessment & Plan Note (Signed)
Noticable disturbance in speech, tone of voice as well as ability to express herself

## 2022-05-21 NOTE — Progress Notes (Signed)
   Marissa [name]     MRN: [id number]      DOB: 1980-08-19  Chief Complaint  Patient presents with   Hospitalization Follow-up    Pt went to [facility] ED for involuntary Movement, slurred speech and left facial weakness on 04/10 CT scan and MRI done earlier at [facility] r/o stroke or TIA. Pt was instructed to f/u with Neuro. Pt most recent OV on 04/15 with [facility] and has completed an EEG. Patient may benefit from neuropsychiatric evaluation given multiple domains that seem to be affected.    HPI Marissa [name] is here for follow up and re-evaluation she is accompanied by her Mother and spouse. Report is of increased frequency and severity of abnormal behavior, which is now including violent behavior like throwing objects as well as a change has been noticed in her speech, supporting video documentation is presented Evaluation at [facility], Neurology recently done and she has completed an EEG,no reporting from [facility] as yet, family anxious to get a diagnosis and start treatment  Pt requesting return to work and being able to drive, though she is fully aware that neither is possible  ROS Denies recent fever or chills. Denies sinus pressure, nasal congestion, ear pain or sore throat. Denies chest congestion, productive cough or wheezing. Denies chest pains, palpitations and leg swelling Denies abdominal pain, nausea, vomiting,diarrhea or constipation.   Denies dysuria, frequency, hesitancy or incontinence. Denies joint pain, swelling and limitation in mobility. Denies skin break down or rash.   PE  BP 130/80   Pulse 81   Ht 5\' 2"  (1.575 m)   Wt 177 lb (80.3 kg)   SpO2 98%   BMI 32.37 kg/m   Patient alert  and in no cardiopulmonary distress.Speech noted to be different and behavior ar times childlike  HEENT: No facial asymmetry, EOMI,     Neck supple .  Chest: Clear to auscultation bilaterally.  CVS: S1, S2 no murmurs, no S3.Regular rate.  Ext: No edema  MS: Adequate ROM spine,  shoulders, hips and knees.  Skin: Intact, no ulcerations or rash noted.  Psych: Good eye contact, abnormal affect.  anxious .  CNS: CN 2-12 intact, power,  normal throughout.no focal deficits noted.   Assessment & Plan  Functional neurological symptom disorder with mixed symptoms Reporting worsening symptoms, at risk being left alone, incapable of working and driving Work excuse for an initial 6 month period which may vary depending on clinical condition, FMLA paperwork to be completed once available. Family is awarethat she needs to be taken to the nearest ED if she is at risk of danger to herself or of someone    Speech disturbance Noticable disturbance in speech, tone of voice as well as ability to express herself  Common migraine with intractable migraine Reports relief with fioricet when needed, which seems to be about 3 times per month  Vitamin D deficiency Currently on once weekly supplement

## 2022-05-21 NOTE — Assessment & Plan Note (Signed)
Reports relief with fioricet when needed, which seems to be about 3 times per month

## 2022-05-21 NOTE — Telephone Encounter (Signed)
Please let Tyonna know that I reviewed notes from her consultations at [facility], and Ithe recommendartion that I see is neediding Neropsychiatry . I plan to refer her to Neurop[sych at [facility], and wanted her to know that is the " next step" in diagnosing and treating her illness She should continue to follow up with the 2  Docs who have seen  her at [facility], as well I will enter referral as soon as you have messaged me that she is aware Please attatch addresses / contact info to paperwork I have completed for Dr Jill Alexanders Mhoon, and Dr Marjo Bicker  Thanks ?? Pls ask  Paperwork is sleeved and in your area

## 2022-05-21 NOTE — Telephone Encounter (Signed)
Patient called asked for nurse to return her call [phone]

## 2022-05-22 ENCOUNTER — Telehealth: Payer: Self-pay | Admitting: Family Medicine

## 2022-05-22 NOTE — Telephone Encounter (Signed)
Pts mother came by the office picked up forms that were completed by Dr. Lodema Hong, also dropped off DMV forms to have completed, states if these forms are not completed her license will be revoked in 30 days.

## 2022-05-22 NOTE — Telephone Encounter (Signed)
Pt called in regard to previous tele messages / mychart messages. Wants a call back in regard to visit and patients work.

## 2022-05-23 ENCOUNTER — Other Ambulatory Visit: Payer: Self-pay | Admitting: Family Medicine

## 2022-05-23 DIAGNOSIS — F447 Conversion disorder with mixed symptom presentation: Secondary | ICD-10-CM

## 2022-05-26 ENCOUNTER — Telehealth: Payer: Self-pay | Admitting: Family Medicine

## 2022-05-26 NOTE — Telephone Encounter (Signed)
Kim called from psychiatry Dr Thedore Mins office questions on the referral not understanding the [facility]?  Call Kim back [phone].

## 2022-05-26 NOTE — Telephone Encounter (Signed)
The initial referral says to [facility] psych but the referral was sent to Neuropsychiatric care center that is not affillated with [facility]

## 2022-05-27 ENCOUNTER — Telehealth: Payer: Self-pay | Admitting: Family Medicine

## 2022-05-27 NOTE — Telephone Encounter (Signed)
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

## 2022-05-28 ENCOUNTER — Encounter: Payer: Self-pay | Admitting: Family Medicine

## 2022-05-28 NOTE — Telephone Encounter (Signed)
Letter typed

## 2022-05-29 ENCOUNTER — Telehealth: Payer: Self-pay

## 2022-05-29 NOTE — Telephone Encounter (Signed)
Aware paperwork and letter are up front for pickup

## 2022-05-29 NOTE — Telephone Encounter (Signed)
..  Patient declines further follow up and engagement by the Managed Medicaid Team. Appropriate care team members and provider have been notified via electronic communication. The Managed Medicaid Team is available to follow up with the patient after provider conversation with the patient regarding recommendation for engagement and subsequent re-referral to the Managed Medicaid Team.     Marissa [name] Care Guide  Medicaid Managed  Care Guide [place]  [phone]  

## 2022-06-09 ENCOUNTER — Encounter: Payer: Self-pay | Admitting: Family Medicine

## 2022-06-09 NOTE — Telephone Encounter (Signed)
Patient aware she is going to touch base with [place] after her appointment on Wednesday as to what she wants to do.

## 2022-06-17 ENCOUNTER — Telehealth: Payer: Self-pay | Admitting: Family Medicine

## 2022-06-17 NOTE — Telephone Encounter (Signed)
Patient having trouble getting into neurologist and still having trouble with [name] gain. Asked for Dr Lodema Hong nurse to return her call today at [phone].

## 2022-06-18 ENCOUNTER — Other Ambulatory Visit: Payer: Medicaid Other | Admitting: Obstetrics and Gynecology

## 2022-06-18 ENCOUNTER — Encounter: Payer: Self-pay | Admitting: Obstetrics and Gynecology

## 2022-06-18 NOTE — Patient Instructions (Signed)
Hi Ms. Severy, thank you for speaking with me today-have a nice day!  Ms. Rourke was given information about Medicaid Managed Care team care coordination services as a part of their [employer] Medicaid benefit. [name] verbally consented to engagement with the [facility] Managed Care team.   If you are experiencing a medical emergency, please call 911 or report to your local emergency department or urgent care.   If you have a non-emergency medical problem during routine business hours, please contact your provider's office and ask to speak with a nurse.   For questions related to your Healthy [facility] health plan, please call: [phone] or visit the homepage here: [email or web address]  If you would like to schedule transportation through your Healthy [facility] plan, please call the following number at least 2 days in advance of your appointment: [phone]  For information about your ride after you set it up, call Ride Assist at [phone]. Use this number to activate a Will Call pickup, or if your transportation is late for a scheduled pickup. Use this number, too, if you need to make a change or cancel a previously scheduled reservation.  If you need transportation services right away, call [phone]. The after-hours call center is staffed 24 hours to handle ride assistance and urgent reservation requests (including discharges) 365 days a year. Urgent trips include sick visits, hospital discharge requests and life-sustaining treatment.  Call the [facility] Line at [phone], at any time, 24 hours a day, 7 days a week. If you are in danger or need immediate medical attention call 911.  If you would like help to quit smoking, call 1-800-QUIT-NOW (570-713-6632) OR Espaol: 1-855-Djelo-Ya (4-742-595-6387) o para ms informacin haga clic aqu or Text READY to 564-332 to register via text  Ms. [name] - following are  the goals we discussed in your visit today:   Goals Addressed    Timeframe:  Long-Range Goal Priority:  High Start Date:  06/18/22                           Expected End Date:    ongoing                   Follow Up Date 07/21/22    - practice safe sex - schedule appointment for vaccines needed due to my age or health - schedule recommended health tests (blood work, mammogram, colonoscopy, pap test) - schedule and keep appointment for annual check-up    Why is this important?   Screening tests can find diseases early when they are easier to treat.  Your doctor or nurse will talk with you about which tests are important for you.  Getting shots for common diseases like the flu and shingles will help prevent them  Patient verbalizes understanding of instructions and care plan provided today and agrees to view in MyChart. Active MyChart status and patient understanding of how to access instructions and care plan via MyChart confirmed with patient.     The Managed Medicaid care management team will reach out to the patient again over the next 30 business  days.  The  Patient  has been provided with contact information for the Managed Medicaid care management team and has been advised to call with any health related questions or concerns.   [name] RN, BSN [place]  [facility] Care Management Coordinator - Managed Medicaid High Risk [phone]   Following is a  copy of your plan of care:  Care Plan : RN Care Manager Plan of Care  Updates made by [name], RN since 06/18/2022 12:00 AM     Problem: Health Promotion or Disease Self-Management (General Plan of Care)      Long-Range Goal: Chronic Disease Management and Care Coordination Needs   Start Date: 06/18/2022  Expected End Date: 09/18/2022  Priority: High  Note:   Current Barriers:  Knowledge Deficits related to plan of care for management of migraines, rhinitis, back pain, h/o CVA Care Coordination  needs related to NEURO appointment Chronic Disease Management support and education needs related to migraines, rhinitis, back pain, h/o CVA 06/18/22: Spoke with patient by phone today at the request of her insurance plan, [employer].  Patient doing okay today, but growing more and more frustrated because she has not been able to get an appt with NEURO-did see someone at [facility] but was not happy there, awaiting another referral ? [name].  RNCM Clinical Goal(s):  Patient will verbalize understanding of plan for management of migraines, rhinitis, back pain, h/o CVA as evidenced by patient report. verbalize basic understanding of migraines, rhinitis, back pain, h/o CVA disease process and self health management plan as evidenced by patient report. take all medications exactly as prescribed and will call provider for medication related questions as evidenced by patient report. demonstrate understanding of rationale for each prescribed medication as evidenced by patient report attend all scheduled medical appointments as evidenced by patient report and EMR review Demonstrate ongoing  adherence to prescribed treatment plan for migraines, rhinitis, back pain, h/o CVA as evidenced by patient report and EMR review continue to work with RN Care Manager to address care management and care coordination needs related to migraines, rhinitis, back pain, h/o CVA as evidenced by adherence to CM Team Scheduled appointments through collaboration with RN Care manager, provider, and care team.   Interventions: Inter-disciplinary care team collaboration (see longitudinal plan of care) Evaluation of current treatment plan related to  self management and patient's adherence to plan as established by provider   (Status:  New goal.)  Long Term Goal Evaluation of current treatment plan related to migraines, rhinitis, back pain, h/o CVA self-management and patient's adherence to plan as  established by provider. Discussed plans with patient for ongoing care management follow up and provided patient with direct contact information for care management team Reviewed medications with patient Collaborated with PCP  regarding appointment Reviewed scheduled/upcoming provider appointments Discussed plans with patient for ongoing care management follow up and provided patient with direct contact information for care management team Assessed social determinant of health barriers 06/18/22:  Message sent to provider to request assistance with NEURO appt.  Patient Goals/Self-Care Activities: Take all medications as prescribed Attend all scheduled provider appointments Call pharmacy for medication refills 3-7 days in advance of running out of medications Perform all self care activities independently  Perform IADL's (shopping, preparing meals, housekeeping, managing finances) independently Call provider office for new concerns or questions   Follow Up Plan:  The patient has been provided with contact information for the care management team and has been advised to call with any health related questions or concerns.  The care management team will reach out to the patient again over the next 30 business days.

## 2022-06-18 NOTE — Telephone Encounter (Signed)
Pt called in regard to [facility] referrals. Patient states that she is going back in forth in regard to referral and has not heard anything in regard. Wants a call back.

## 2022-06-18 NOTE — Patient Outreach (Signed)
Medicaid Managed Care   Nurse Care Manager Note  06/18/2022 Name:  Marissa [name] MRN:  [id number] DOB:  January 13, 1981  Marissa [name] is an 42 y.o. year old female who is a primary patient of Kerri Perches, MD.  The [facility] Managed Care Coordination team was consulted for assistance with:    migraines, rhinitis, back pain, h/o CVA  [name] was given information about Medicaid Managed Care Coordination team services today. Marissa [name] Patient agreed to services and verbal consent obtained.  Engaged with patient by telephone for initial visit in response to provider referral for case management and/or care coordination services.   Assessments/Interventions:  Review of past medical history, allergies, medications, health status, including review of consultants reports, laboratory and other test data, was performed as part of comprehensive evaluation and provision of chronic care management services.  SDOH (Social Determinants of Health) assessments and interventions performed: SDOH Interventions    Flowsheet Row Patient Outreach Telephone from 06/18/2022 in [place] [facility] DEPARTMENT Office Visit from 03/14/2022 in [facility]  SDOH Interventions    Food Insecurity Interventions Intervention Not Indicated --  Housing Interventions Intervention Not Indicated --  Transportation Interventions Intervention Not Indicated --  Utilities Interventions Intervention Not Indicated --  Depression Interventions/Treatment  -- Counseling, [id number] Referral     Care Plan  Allergies  Allergen Reactions   Triptans     Cannot use triptans for migraine per neurology due to stroke history   Morphine And Codeine Hives    Feels like skin is burning     Medications Reviewed Today     Reviewed by [name], RN (Registered Nurse) on 06/18/22 at 1013  Med List Status: <None>   Medication Order Taking? Sig Documenting Provider Last Dose Status  Informant  butalbital-acetaminophen-caffeine (FIORICET) 50-325-40 MG tablet [id number] No Take one tablet by mouth twice daily, as needed, for headache. Maximum is twice per week  Patient taking differently: Take 1 tablet by mouth 2 (two) times daily as needed for headache or migraine. Maximum is twice per week   Kerri Perches, MD Taking Active Self  ergocalciferol (VITAMIN D2) 1.25 MG (50000 UT) capsule [id number]  Take 1 capsule (50,000 Units total) by mouth once a week. One capsule once weekly Kerri Perches, MD  Active            Patient Active Problem List   Diagnosis Date Noted   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   H/O Amplatzer atrial septal defect closure 03/25/2022   Depression, major, single episode, moderate (HCC) 03/16/2022   Allergic rhinitis 03/16/2022   Abnormal EKG 03/03/2022   Encounter for examination following treatment at hospital 03/03/2022   Abnormal MRA, brain 10/25/2021   Back pain with right-sided radiculopathy 12/12/2020   Neurological disorder 09/07/2020   Speech disturbance 09/03/2020   Spell of abnormal behavior    Abnormal MRI of head    TIA (transient ischemic attack) 08/13/2020   History of CVA (cerebrovascular accident) 12/09/2018   Anxiety 12/09/2018   Obesity (BMI 30.0-34.9) 12/09/2018   Common migraine with intractable migraine 11/08/2015   PFO (patent foramen ovale) 10/08/2015   Vitamin D deficiency 10/08/2015   Conditions to be addressed/monitored per PCP order:  Mmgraines, rhinitis, back pain, h/o CVA  Care Plan : RN Care Manager Plan of Care  Updates made by [name], RN since 06/18/2022 12:00 AM     Problem: Health Promotion or Disease Self-Management (  General Plan of Care)      Long-Range Goal: Chronic Disease Management and Care Coordination Needs   Start Date: 06/18/2022  Expected End Date: 09/18/2022  Priority: High  Note:   Current Barriers:  Knowledge Deficits related to  plan of care for management of migraines, rhinitis, back pain, h/o CVA Care Coordination needs related to NEURO appointment Chronic Disease Management support and education needs related to migraines, rhinitis, back pain, h/o CVA 06/18/22: Spoke with patient by phone today at the request of her insurance plan, [employer].  Patient doing okay today, but growing more and more frustrated because she has not been able to get an appt with NEURO-did see someone at [facility] but was not happy there, awaiting another referral ? [name].  RNCM Clinical Goal(s):  Patient will verbalize understanding of plan for management of migraines, rhinitis, back pain, h/o CVA as evidenced by patient report. verbalize basic understanding of migraines, rhinitis, back pain, h/o CVA disease process and self health management plan as evidenced by patient report. take all medications exactly as prescribed and will call provider for medication related questions as evidenced by patient report. demonstrate understanding of rationale for each prescribed medication as evidenced by patient report attend all scheduled medical appointments as evidenced by patient report and EMR review Demonstrate ongoing  adherence to prescribed treatment plan for migraines, rhinitis, back pain, h/o CVA as evidenced by patient report and EMR review continue to work with RN Care Manager to address care management and care coordination needs related to migraines, rhinitis, back pain, h/o CVA as evidenced by adherence to CM Team Scheduled appointments through collaboration with RN Care manager, provider, and care team.   Interventions: Inter-disciplinary care team collaboration (see longitudinal plan of care) Evaluation of current treatment plan related to  self management and patient's adherence to plan as established by provider   (Status:  New goal.)  Long Term Goal Evaluation of current treatment plan related to  migraines, rhinitis, back pain, h/o CVA self-management and patient's adherence to plan as established by provider. Discussed plans with patient for ongoing care management follow up and provided patient with direct contact information for care management team Reviewed medications with patient Collaborated with PCP  regarding appointment Reviewed scheduled/upcoming provider appointments Discussed plans with patient for ongoing care management follow up and provided patient with direct contact information for care management team Assessed social determinant of health barriers 06/18/22:  Message sent to provider to request assistance with NEURO appt.  Patient Goals/Self-Care Activities: Take all medications as prescribed Attend all scheduled provider appointments Call pharmacy for medication refills 3-7 days in advance of running out of medications Perform all self care activities independently  Perform IADL's (shopping, preparing meals, housekeeping, managing finances) independently Call provider office for new concerns or questions   Follow Up Plan:  The patient has been provided with contact information for the care management team and has been advised to call with any health related questions or concerns.  The care management team will reach out to the patient again over the next 30 business  days.   Long-Range Goal: Establish Plan of care for Chronic Disease Management Needs and Care Coordination Needs   Priority: High  Note:   Timeframe:  Long-Range Goal Priority:  High Start Date:  06/18/22                           Expected End Date:  ongoing                   Follow Up Date 07/21/22    - practice safe sex - schedule appointment for vaccines needed due to my age or health - schedule recommended health tests (blood work, mammogram, colonoscopy, pap test) - schedule and keep appointment for annual check-up    Why is this important?   Screening tests can find diseases early when  they are easier to treat.  Your doctor or nurse will talk with you about which tests are important for you.  Getting shots for common diseases like the flu and shingles will help prevent them.     Follow Up:  Patient agrees to Care Plan and Follow-up.  Plan: The Managed Medicaid care management team will reach out to the patient again over the next 30 business  days. and The  Patient has been provided with contact information for the Managed Medicaid care management team and has been advised to call with any health related questions or concerns.  Date/time of next scheduled RN care management/care coordination outreach:07/21/22 at 1230

## 2022-06-23 ENCOUNTER — Encounter: Payer: Self-pay | Admitting: Family Medicine

## 2022-07-01 NOTE — Telephone Encounter (Signed)
Patient aware and appt scheduled to see [place] 07/03/22

## 2022-07-03 ENCOUNTER — Ambulatory Visit (INDEPENDENT_AMBULATORY_CARE_PROVIDER_SITE_OTHER): Payer: Medicaid Other | Admitting: Family Medicine

## 2022-07-03 ENCOUNTER — Encounter: Payer: Self-pay | Admitting: Family Medicine

## 2022-07-03 VITALS — BP 131/82 | HR 96 | Ht 62.0 in | Wt 179.1 lb

## 2022-07-03 DIAGNOSIS — M5410 Radiculopathy, site unspecified: Secondary | ICD-10-CM | POA: Diagnosis not present

## 2022-07-03 DIAGNOSIS — F447 Conversion disorder with mixed symptom presentation: Secondary | ICD-10-CM | POA: Diagnosis not present

## 2022-07-03 MED ORDER — PREDNISONE 10 MG PO TABS
10.0000 mg | ORAL_TABLET | Freq: Two times a day (BID) | ORAL | 0 refills | Status: DC
Start: 2022-07-03 — End: 2022-10-07

## 2022-07-03 NOTE — Patient Instructions (Signed)
F/U in 3 months, cancel July follow up please  5 day course of prednisone is prescribed for acute back pain and you are referred to Dr Romeo Apple  I will communicate with your case worker as well as my referral staff to see if an appointment DATE can be set for your Neuropsychiatric appointment at [facility], the referral was placed since May 2 , by your neurologist at [facility], and that is the proposed course of treatment of your condition  New work excuse letter to start from 07/03/2022 to return 01/22/2023 to be provided today   I recommend starting the application process for long term disability  Thanks for choosing [facility], we consider it a privelige to serve you.

## 2022-07-03 NOTE — Progress Notes (Signed)
   DESHANDRA KOELLING     MRN: [id number]      DOB: 11/09/1980  Chief Complaint  Patient presents with   Follow-up    Follow up on neurological spells    HPI Ms. Popke is here for follow up and re-evaluation of chronic medical conditions, medication management and review of any available recent lab and radiology data.  1 week h/o unprovoked LBP radiating down left leg Ongoing recurrent episodes of abnormal movement and behavior which is disabling, she is unable to function, ruled ot on 2 separate occasions as being epilepsy most recently in Aril 2024 a t [facility]. Referral has been placed since 05/22/2022 by Neurologist at [facility] to Neuro Psychiatry for evaluation and management and up to 07/03/2022 patient has been unable to get an appointment scheduled.Because of her sym[ptoms, she is unsafe to be left on her own, unable to drive and is unable to function independently. Work note provided to extend her time out of work until 01/22/2023, she does want to work however currently incapable    ROS Denies recent fever or chills. Denies sinus pressure, nasal congestion, ear pain or sore throat. Denies chest congestion, productive cough or wheezing. Denies chest pains, palpitations and leg swelling Denies abdominal pain, nausea, vomiting,diarrhea or constipation.   Denies dysuria, frequency, hesitancy or incontinence.  Does have anxiety  over current health , still waiting and hoping for a dx and treatment which will allow her to function independently Denies skin break down or rash.   PE  BP 131/82 (BP Location: Right Arm, Patient Position: Sitting, Cuff Size: Large)   Pulse 96   Ht 5\' 2"  (1.575 m)   Wt 179 lb 1.9 oz (81.2 kg)   SpO2 98%   BMI 32.76 kg/m   Patient alert and oriented and in no cardiopulmonary distress.  HEENT: No facial asymmetry, EOMI,     Neck supple .  Chest: Clear to auscultation bilaterally.  CVS: S1, S2 no murmurs, no S3.Regular rate.   Ext: No edema  MS:  Decreased  ROM lumbar spine, adequate in shoulders, hips and knees.  Skin: Intact, no ulcerations or rash noted.  Psych: Good eye contact, normal affect. CNS: CN 2-12 intact, power,  normal throughout.no focal deficits noted.   Assessment & Plan  Acute back pain with radiculopathy New onset, unprovoked, short course of predniosne prescribed and she is referred to Ortho  Functional neurological symptom disorder with mixed symptoms Ongoing disabling symptoms, needs Neuropsych appt date and time , Neurologist at [facility] has recommended this based on normal brain scan and EEG which does not correlate with episodes of abnormal behavior, ruling out seizure disorder for a 2nd time in this patient. Needs furhter evaluation and management by Sub specialist  recommended, work excuse extended until 01/22/2023, and long term disability is recommended. Referral staf to work on this to help the process to move along.  Attempt made to get appt date at [facility] was unsuccessful, however , an appt at [facility] is offered will refer to [facility]

## 2022-07-06 DIAGNOSIS — M5410 Radiculopathy, site unspecified: Secondary | ICD-10-CM | POA: Insufficient documentation

## 2022-07-06 NOTE — Assessment & Plan Note (Addendum)
Ongoing disabling symptoms, needs Neuropsych appt date and time , Neurologist at [facility] has recommended this based on normal brain scan and EEG which does not correlate with episodes of abnormal behavior, ruling out seizure disorder for a 2nd time in this patient. Needs furhter evaluation and management by Sub specialist  recommended, work excuse extended until 01/22/2023, and long term disability is recommended. Referral staf to work on this to help the process to move along.  Attempt made to get appt date at [facility] was unsuccessful, however , an appt at [facility] is offered will refer to [facility]

## 2022-07-06 NOTE — Assessment & Plan Note (Signed)
New onset, unprovoked, short course of predniosne prescribed and she is referred to Ortho

## 2022-07-08 ENCOUNTER — Encounter: Payer: Self-pay | Admitting: Family Medicine

## 2022-07-14 ENCOUNTER — Telehealth: Payer: Self-pay | Admitting: Radiology

## 2022-07-14 ENCOUNTER — Other Ambulatory Visit: Payer: Self-pay | Admitting: Orthopedic Surgery

## 2022-07-14 ENCOUNTER — Telehealth: Payer: Self-pay | Admitting: Orthopedic Surgery

## 2022-07-14 ENCOUNTER — Telehealth: Payer: Self-pay

## 2022-07-14 ENCOUNTER — Encounter: Payer: Self-pay | Admitting: Orthopedic Surgery

## 2022-07-14 ENCOUNTER — Ambulatory Visit (INDEPENDENT_AMBULATORY_CARE_PROVIDER_SITE_OTHER): Payer: Medicaid Other | Admitting: Orthopedic Surgery

## 2022-07-14 VITALS — BP 137/86 | HR 87 | Ht 62.0 in | Wt 183.0 lb

## 2022-07-14 DIAGNOSIS — M545 Low back pain, unspecified: Secondary | ICD-10-CM | POA: Diagnosis not present

## 2022-07-14 DIAGNOSIS — M5410 Radiculopathy, site unspecified: Secondary | ICD-10-CM

## 2022-07-14 DIAGNOSIS — F40240 Claustrophobia: Secondary | ICD-10-CM

## 2022-07-14 MED ORDER — ALPRAZOLAM 0.5 MG PO TABS
0.5000 mg | ORAL_TABLET | Freq: Every evening | ORAL | 0 refills | Status: DC | PRN
Start: 2022-07-14 — End: 2022-10-07

## 2022-07-14 NOTE — Telephone Encounter (Signed)
Patient came back in and advised me that she needs medication for the MRI  so I will send the message to Dr Romeo Apple for the [employer]

## 2022-07-14 NOTE — Telephone Encounter (Signed)
Marissa [name]  Patient has appt tomorrow for her MRI and she needs medicine.   Ph[facility]: [facility] in [place]

## 2022-07-14 NOTE — Telephone Encounter (Signed)
Tori told me patient called, I already created another referral, didn't know there was already one open, closing this one see other one.

## 2022-07-14 NOTE — Progress Notes (Signed)
Chief Complaint  Patient presents with   Back Pain    Bil lower back pain with left leg pain with some radiating to the buttocks and thigh and some tingling in the foot    42 year old female with some type of central neurologic process presents with bilateral leg pain and lower back pain which she has had for years.  Worse over the last year  She has never had physical therapy or extensive medical management  She complains of bilateral leg pain occasional numbness occasional radiation nothing constant  Red flags were negative  Problem list, medical hx, medications and allergies reviewed   BP 137/86   Pulse 87   Ht 5\' 2"  (1.575 m)   Wt 183 lb (83 kg)   BMI 33.47 kg/m  Physical Exam Vitals and nursing note reviewed.  Constitutional:      Appearance: Normal appearance.  HENT:     Head: Normocephalic and atraumatic.  Eyes:     General: No scleral icterus.       Right eye: No discharge.        Left eye: No discharge.     Extraocular Movements: Extraocular movements intact.     Conjunctiva/sclera: Conjunctivae normal.     Pupils: Pupils are equal, round, and reactive to light.  Cardiovascular:     Rate and Rhythm: Normal rate.     Pulses: Normal pulses.  Musculoskeletal:     Comments: She had definite balance issues and clonus if not rigidity on the left side of her leg when lightly stimulated  She had tenderness in her lower back  Her straight leg raises were negative  She had normal hip range of motion bilaterally.  There was no detectable weakness on the right but there was some on the left especially with dorsiflexion of the left foot  Skin:    General: Skin is warm and dry.     Capillary Refill: Capillary refill takes less than 2 seconds.  Neurological:     General: No focal deficit present.     Mental Status: She is alert and oriented to person, place, and time.     Sensory: No sensory deficit.     Motor: Weakness present.     Coordination: Coordination abnormal.      Gait: Gait abnormal.     Deep Tendon Reflexes: Reflexes abnormal.  Psychiatric:        Mood and Affect: Mood normal.        Behavior: Behavior normal.        Thought Content: Thought content normal.        Judgment: Judgment normal.     Encounter Diagnoses  Name Primary?   Lumbar pain Yes   Radicular pain of left lower extremity     .ASSESSMENT/PLAN   She had an MRI back in 2022  I agree with the report dictated by Virgilio Belling that she has some mild facet arthritis and mild disc space loss at L5-S1 and over the seen at L4 and 5  She has a bizarre presentation appears to have some type of neurologic process going on.  She has ointment scheduled with [name] or [facility] to try to evaluate.  She is already had CT scans and MRI as they were negative  I really do not have much to offer her  I am going to get an MRI of her back to see if she has a herniated disc  I offered her Robaxin prednisone anti-inflammatories and gabapentin but she  preferred to take Tylenol

## 2022-07-14 NOTE — Progress Notes (Signed)
Requested Prescriptions   Signed Prescriptions Disp Refills   ALPRAZolam (XANAX) 0.5 MG tablet 1 tablet 0    Sig: Take 1 tablet (0.5 mg total) by mouth at bedtime as needed for anxiety. Take for mri

## 2022-07-14 NOTE — Patient Instructions (Signed)
While we are working on your approval for MRI please go ahead and call to schedule your appointment with [facility] within at least one (1) week.   Central Scheduling [phone]  

## 2022-07-14 NOTE — Telephone Encounter (Signed)
Patient called for something for claustrophobia before the MRI scan

## 2022-07-15 ENCOUNTER — Ambulatory Visit
Admission: RE | Admit: 2022-07-15 | Discharge: 2022-07-15 | Disposition: A | Discharge status: Home / Self Care | Payer: Medicaid Other | Source: Ambulatory Visit | Attending: Orthopedic Surgery | Admitting: Orthopedic Surgery

## 2022-07-15 ENCOUNTER — Ambulatory Visit: Payer: Medicaid Other | Admitting: Family Medicine

## 2022-07-15 DIAGNOSIS — M5410 Radiculopathy, site unspecified: Secondary | ICD-10-CM

## 2022-07-15 DIAGNOSIS — M545 Low back pain, unspecified: Secondary | ICD-10-CM

## 2022-07-21 ENCOUNTER — Other Ambulatory Visit: Payer: Medicaid Other | Admitting: Obstetrics and Gynecology

## 2022-07-21 ENCOUNTER — Encounter: Payer: Self-pay | Admitting: Obstetrics and Gynecology

## 2022-07-21 NOTE — Patient Outreach (Signed)
Medicaid Managed Care   Nurse Care Manager Note  07/21/2022 Name:  Marissa [name] MRN:  [id number] DOB:  [birth year]  Marissa [name] is an 42 y.o. year old female who is a primary patient of Marissa Perches, MD.  The [facility] Managed Care Coordination team was consulted for assistance with:    Chronic healthcare management needs, migraines, rhinitis, back pain, h/o CVA  Marissa [name] was given information about Medicaid Managed Care Coordination team services today. Marissa [name] Patient agreed to services and verbal consent obtained.  Engaged with patient by telephone for follow up visit in response to provider referral for case management and/or care coordination services.   Assessments/Interventions:  Review of past medical history, allergies, medications, health status, including review of consultants reports, laboratory and other test data, was performed as part of comprehensive evaluation and provision of chronic care management services.  SDOH (Social Determinants of Health) assessments and interventions performed: SDOH Interventions    Flowsheet Row Patient Outreach Telephone from 07/21/2022 in [place] POPULATION HEALTH DEPARTMENT Patient Outreach Telephone from 06/18/2022 in [place] POPULATION HEALTH DEPARTMENT Office Visit from 03/14/2022 in [facility]  SDOH Interventions     Food Insecurity Interventions -- Intervention Not Indicated --  Housing Interventions -- Intervention Not Indicated --  Transportation Interventions -- Intervention Not Indicated --  Utilities Interventions -- Intervention Not Indicated --  Alcohol Usage Interventions Intervention Not Indicated (Score <7) -- --  Depression Interventions/Treatment  -- -- Counseling, [id number] Referral     Care Plan  Allergies  Allergen Reactions   Triptans     Cannot use triptans for migraine per neurology due to stroke history   Morphine And Codeine Hives    Feels like skin is  burning    Medications Reviewed Today     Reviewed by Marissa Chandler, RN (Registered Nurse) on 07/21/22 at 1443  Med List Status: <None>   Medication Order Taking? Sig Documenting Provider Last Dose Status Informant  ALPRAZolam (XANAX) 0.5 MG tablet [id number] No Take 1 tablet (0.5 mg total) by mouth at bedtime as needed for anxiety. Take for mri  Patient not taking: Reported on 07/21/2022   Vickki Hearing, MD Not Taking Active   butalbital-acetaminophen-caffeine [facility]) [phone] MG tablet [id number]  Take one tablet by mouth twice daily, as needed, for headache. Maximum is twice per week  Patient taking differently: Take 1 tablet by mouth 2 (two) times daily as needed for headache or migraine. Maximum is twice per week   Marissa Perches, MD  Active Self  ergocalciferol (VITAMIN D2) 1.25 MG (50000 UT) capsule [id number]  Take 1 capsule (50,000 Units total) by mouth once a week. One capsule once weekly Marissa Perches, MD  Active   predniSONE (DELTASONE) 10 MG tablet [id number]  Take 1 tablet (10 mg total) by mouth 2 (two) times daily with a meal.  Patient not taking: Reported on 07/14/2022   Marissa Perches, MD  Active            Patient Active Problem List   Diagnosis Date Noted   Acute back pain with radiculopathy 07/06/2022   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   H/O Amplatzer atrial septal defect closure 03/25/2022   Depression, major, single episode, moderate (HCC) 03/16/2022   Allergic rhinitis 03/16/2022   Abnormal EKG 03/03/2022   Encounter for examination following treatment at hospital 03/03/2022   Abnormal MRA, brain 10/25/2021   Back pain with  right-sided radiculopathy 12/12/2020   Neurological disorder 09/07/2020   Speech disturbance 09/03/2020   Spell of abnormal behavior    Abnormal MRI of head    TIA (transient ischemic attack) 08/13/2020   History of CVA (cerebrovascular accident) 12/09/2018   Anxiety  12/09/2018   Obesity (BMI 30.0-34.9) 12/09/2018   Common migraine with intractable migraine 11/08/2015   PFO (patent foramen ovale) 10/08/2015   Vitamin D deficiency 10/08/2015   Conditions to be addressed/monitored per PCP order:  Chronic healthcare management needs, migraines, rhinitis, back pain, h/o CVA  Care Plan : RN Care Manager Plan of Care  Updates made by Marissa Chandler, RN since 07/21/2022 12:00 AM     Problem: Health Promotion or Disease Self-Management (General Plan of Care)      Long-Range Goal: Chronic Disease Management and Care Coordination Needs   Start Date: 06/18/2022  Expected End Date: 09/18/2022  Priority: High  Note:   Current Barriers:  Knowledge Deficits related to plan of care for management of migraines, rhinitis, back pain, h/o CVA Care Coordination needs related to NEURO appointment Chronic Disease Management support and education needs related to migraines, rhinitis, back pain, h/o CVA 07/21/22:  patient with continued abnormal movements and behavior.  "Excruciating" leg pain, L > R.  Has upcoming appts at [facility] and [place].  In process of applying for LTD.  Using walker and cane prn and taking OTC Tylenol and Motrin.    RNCM Clinical Goal(s):  Patient will verbalize understanding of plan for management of migraines, rhinitis, back pain, h/o CVA as evidenced by patient report. verbalize basic understanding of migraines, rhinitis, back pain, h/o CVA disease process and self health management plan as evidenced by patient report. take all medications exactly as prescribed and will call provider for medication related questions as evidenced by patient report. demonstrate understanding of rationale for each prescribed medication as evidenced by patient report attend all scheduled medical appointments as evidenced by patient report and EMR review Demonstrate ongoing  adherence to prescribed treatment plan for migraines, rhinitis, back pain, h/o CVA as evidenced by  patient report and EMR review continue to work with RN Care Manager to address care management and care coordination needs related to migraines, rhinitis, back pain, h/o CVA as evidenced by adherence to CM Team Scheduled appointments through collaboration with RN Care manager, provider, and care team.   Interventions: Inter-disciplinary care team collaboration (see longitudinal plan of care) Evaluation of current treatment plan related to  self management and patient's adherence to plan as established by provider   (Status:  New goal.)  Long Term Goal Evaluation of current treatment plan related to migraines, rhinitis, back pain, h/o CVA self-management and patient's adherence to plan as established by provider. Discussed plans with patient for ongoing care management follow up and provided patient with direct contact information for care management team Reviewed medications with patient Collaborated with PCP  regarding appointment Reviewed scheduled/upcoming provider appointments Discussed plans with patient for ongoing care management follow up and provided patient with direct contact information for care management team Assessed social determinant of health barriers 06/18/22:  Message sent to provider to request assistance with NEURO appt.  Patient Goals/Self-Care Activities: Take all medications as prescribed Attend all scheduled provider appointments Call pharmacy for medication refills 3-7 days in advance of running out of medications Perform all self care activities independently  Perform IADL's (shopping, preparing meals, housekeeping, managing finances) independently Call provider office for new concerns or questions   Follow Up Plan:  The patient has been provided with contact information for the care management team and has been advised to call with any health related questions or concerns.  The care management team will reach out to the patient again over the next 30 business   days.   Long-Range Goal: Establish Plan of care for Chronic Disease Management Needs and Care Coordination Needs   Priority: High  Note:   Timeframe:  Long-Range Goal Priority:  High Start Date:  06/18/22                           Expected End Date:    ongoing                   Follow Up Date: 08/22/22   - practice safe sex - schedule appointment for vaccines needed due to my age or health - schedule recommended health tests (blood work, mammogram, colonoscopy, pap test) - schedule and keep appointment for annual check-up    Why is this important?   Screening tests can find diseases early when they are easier to treat.  Your doctor or nurse will talk with you about which tests are important for you.  Getting shots for common diseases like the flu and shingles will help prevent them.  07/21/22:  Patient seen by Gaylord Shih and PCP in June.  Has NEUROPSYCH appt at [facility] 8/1 and appt at [facility] for movement disorder 7/31    Follow Up:  Patient agrees to Care Plan and Follow-up.  Plan: The Managed Medicaid care management team will reach out to the patient again over the next 30 business  days. and The  Patient has been provided with contact information for the Managed Medicaid care management team and has been advised to call with any health related questions or concerns.  Date/time of next scheduled RN care management/care coordination outreach:  08/22/22 at 230

## 2022-07-21 NOTE — Patient Instructions (Signed)
Hi Marissa [name], thanks for speaking to me this afternoon.  I hope you have a nice week!  [name] was given information about Medicaid Managed Care team care coordination services as a part of their Healthy Blue Medicaid benefit. Marissa [name] verbally consented to engagement with the [facility] Managed Care team.   If you are experiencing a medical emergency, please call 911 or report to your local emergency department or urgent care.   If you have a non-emergency medical problem during routine business hours, please contact your provider's office and ask to speak with a nurse.   For questions related to your Healthy [facility] health plan, please call: [phone] or visit the homepage here: [email or web address]  If you would like to schedule transportation through your Healthy [facility] plan, please call the following number at least 2 days in advance of your appointment: [phone]  For information about your ride after you set it up, call Ride Assist at [phone]. Use this number to activate a Will Call pickup, or if your transportation is late for a scheduled pickup. Use this number, too, if you need to make a change or cancel a previously scheduled reservation.  If you need transportation services right away, call [phone]. The after-hours call center is staffed 24 hours to handle ride assistance and urgent reservation requests (including discharges) 365 days a year. Urgent trips include sick visits, hospital discharge requests and life-sustaining treatment.  Call the [facility] Line at [phone], at any time, 24 hours a day, 7 days a week. If you are in danger or need immediate medical attention call 911.  If you would like help to quit smoking, call 1-800-QUIT-NOW ((909) 460-4350) OR Espaol: 1-855-Djelo-Ya (1-761-607-3710) o para ms informacin haga clic aqu or Text READY to 626-948 to register via text  Ms.  Marissa [name] - following are the goals we discussed in your visit today:   Goals Addressed    Timeframe:  Long-Range Goal Priority:  High Start Date:  06/18/22                           Expected End Date:    ongoing                   Follow Up Date: 08/22/22   - practice safe sex - schedule appointment for vaccines needed due to my age or health - schedule recommended health tests (blood work, mammogram, colonoscopy, pap test) - schedule and keep appointment for annual check-up    Why is this important?   Screening tests can find diseases early when they are easier to treat.  Your doctor or nurse will talk with you about which tests are important for you.  Getting shots for common diseases like the flu and shingles will help prevent them.  07/21/22:  Patient seen by Gaylord Shih and PCP in June.  Has NEUROPSYCH appt at [facility] 8/1 and appt at [facility] for movement disorder 7/31   Patient verbalizes understanding of instructions and care plan provided today and agrees to view in MyChart. Active MyChart status and patient understanding of how to access instructions and care plan via MyChart confirmed with patient.     The Managed Medicaid care management team will reach out to the patient again over the next 30 business  days.  The  Patient has been provided with contact information for the Managed Medicaid care management team and has been advised to call with any health  related questions or concerns.   [name] RN, BSN [place]  [facility] Care Management Coordinator - Managed Medicaid High Risk [phone]   Following is a copy of your plan of care:  Care Plan : RN Care Manager Plan of Care  Updates made by [name], RN since 07/21/2022 12:00 AM     Problem: Health Promotion or Disease Self-Management (General Plan of Care)      Long-Range Goal: Chronic Disease Management and Care Coordination Needs   Start Date: 06/18/2022  Expected End Date: 09/18/2022  Priority: High   Note:   Current Barriers:  Knowledge Deficits related to plan of care for management of migraines, rhinitis, back pain, h/o CVA Care Coordination needs related to NEURO appointment Chronic Disease Management support and education needs related to migraines, rhinitis, back pain, h/o CVA 07/21/22:  patient with continued abnormal movements and behavior.  "Excruciating" leg pain, L > R.  Has upcoming appts at [facility] and [place].  In process of applying for LTD.  Using walker and cane prn and taking OTC Tylenol and Motrin.    RNCM Clinical Goal(s):  Patient will verbalize understanding of plan for management of migraines, rhinitis, back pain, h/o CVA as evidenced by patient report. verbalize basic understanding of migraines, rhinitis, back pain, h/o CVA disease process and self health management plan as evidenced by patient report. take all medications exactly as prescribed and will call provider for medication related questions as evidenced by patient report. demonstrate understanding of rationale for each prescribed medication as evidenced by patient report attend all scheduled medical appointments as evidenced by patient report and EMR review Demonstrate ongoing  adherence to prescribed treatment plan for migraines, rhinitis, back pain, h/o CVA as evidenced by patient report and EMR review continue to work with RN Care Manager to address care management and care coordination needs related to migraines, rhinitis, back pain, h/o CVA as evidenced by adherence to CM Team Scheduled appointments through collaboration with RN Care manager, provider, and care team.   Interventions: Inter-disciplinary care team collaboration (see longitudinal plan of care) Evaluation of current treatment plan related to  self management and patient's adherence to plan as established by provider   (Status:  New goal.)  Long Term Goal Evaluation of current treatment plan related to migraines, rhinitis, back pain, h/o CVA  self-management and patient's adherence to plan as established by provider. Discussed plans with patient for ongoing care management follow up and provided patient with direct contact information for care management team Reviewed medications with patient Collaborated with PCP  regarding appointment Reviewed scheduled/upcoming provider appointments Discussed plans with patient for ongoing care management follow up and provided patient with direct contact information for care management team Assessed social determinant of health barriers 06/18/22:  Message sent to provider to request assistance with NEURO appt.  Patient Goals/Self-Care Activities: Take all medications as prescribed Attend all scheduled provider appointments Call pharmacy for medication refills 3-7 days in advance of running out of medications Perform all self care activities independently  Perform IADL's (shopping, preparing meals, housekeeping, managing finances) independently Call provider office for new concerns or questions   Follow Up Plan:  The patient has been provided with contact information for the care management team and has been advised to call with any health related questions or concerns.  The care management team will reach out to the patient again over the next 30 business  days.

## 2022-07-22 ENCOUNTER — Telehealth (INDEPENDENT_AMBULATORY_CARE_PROVIDER_SITE_OTHER): Payer: Medicaid Other | Admitting: Orthopedic Surgery

## 2022-07-22 ENCOUNTER — Telehealth: Payer: Self-pay | Admitting: Radiology

## 2022-07-22 ENCOUNTER — Telehealth: Payer: Self-pay | Admitting: Orthopedic Surgery

## 2022-07-22 DIAGNOSIS — M5410 Radiculopathy, site unspecified: Secondary | ICD-10-CM

## 2022-07-22 DIAGNOSIS — M5126 Other intervertebral disc displacement, lumbar region: Secondary | ICD-10-CM

## 2022-07-22 NOTE — Telephone Encounter (Signed)
-----   Message from Vickki Hearing, MD sent at 07/22/2022 11:06 AM EDT ----- Monday appointment canceled  Patient's results given by phone  Marissa [name] the patient needs to be referred to Dr. Christell Constant for L5-S1 disc protrusion

## 2022-07-22 NOTE — Telephone Encounter (Signed)
L5-S1 disc protrusion left  Patient advised to see neurosurgery  We will make the referral  Patient results given.

## 2022-07-22 NOTE — Telephone Encounter (Signed)
error 

## 2022-07-22 NOTE — Telephone Encounter (Signed)
Put in order cassandra will call

## 2022-07-28 ENCOUNTER — Ambulatory Visit: Payer: Medicaid Other | Admitting: Orthopedic Surgery

## 2022-07-28 ENCOUNTER — Other Ambulatory Visit: Payer: Self-pay | Admitting: Family Medicine

## 2022-07-30 MED ORDER — BUTALBITAL-APAP-CAFFEINE 50-325-40 MG PO TABS: ORAL_TABLET | ORAL | 0 refills | Status: DC

## 2022-07-31 ENCOUNTER — Other Ambulatory Visit (INDEPENDENT_AMBULATORY_CARE_PROVIDER_SITE_OTHER): Payer: Medicaid Other

## 2022-07-31 ENCOUNTER — Ambulatory Visit: Payer: Medicaid Other | Admitting: Orthopedic Surgery

## 2022-07-31 DIAGNOSIS — M5410 Radiculopathy, site unspecified: Secondary | ICD-10-CM

## 2022-07-31 MED ORDER — PREGABALIN 75 MG PO CAPS: 75.0000 mg | ORAL_CAPSULE | Freq: Two times a day (BID) | ORAL | 0 refills | Status: DC

## 2022-07-31 NOTE — Progress Notes (Signed)
Orthopedic Spine Surgery Office Note  Assessment: Patient is a 42 y.o. female with low back pain that radiates into the left posterior thigh and leg into the plantar aspect of the foot.  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 Discussed that the conservative treatments include:  -activity modification  -physical therapy  -over the counter pain medications  -medrol dosepak  -lumbar steroid injections -Patient has tried Tylenol, ibuprofen, prednisone, heating pad -Prescribed Lyrica to try for pain relief.  We talked about microdiscectomy or injection as treatment options for her.  After going over both of those options, she wanted to try an injection.  Referral was provided to her today -She has multiple pains and symptoms.  I explained to her that her disc herniation at L5/S1 can cause radiating left leg pain but it does not explain any of her other pain or symptoms (like the tremor in her right arm).  She is seeing neurology for some of these other symptoms and encouraged her to continue to follow with them -Patient should return to office in 4 weeks, x-rays at next visit: None   Patient expressed understanding of the plan and all questions were answered to the patient's satisfaction.   ___________________________________________________________________________   History:  Patient is a 42 y.o. female who presents today for lumbar spine.  Patient has had 2 years of low back pain.  It has gotten progressively worse with time.  She has noticed within the last 8 weeks the development of severe left leg pain.  She says she feels it along the posterior aspect of the left thigh and left leg.  It goes into the plantar aspect of the foot.  The pain is worst in the posterior aspect of the left leg.  She says at times it feels like  a blood pressure cuff is squeezing that part of the leg really tightly.  She notices numbness and paresthesias mostly in the posterior aspect of the left leg and plantar aspect of the foot.  No other numbness or paresthesias.  She is not having pain radiating into the right lower extremity.   Weakness: Yes, left leg feels weak especially below the knee.  No other weakness noted Symptoms of imbalance: Denies Paresthesias and numbness: Yes, along the posterior aspect of the left leg and into the plantar aspect of the left foot.  No other numbness or paresthesias Bowel or bladder incontinence: Denies Saddle anesthesia: Denies  Treatments tried: Tylenol, ibuprofen, prednisone, heating pad  Review of systems: Denies fevers and chills, night sweats, unexplained weight loss, history of cancer.  Has had pain that wakes her at night  Past medical history: Migraines History of stroke Chronic pain Anxiety Hyperlipidemia   Allergies: Triptans, morphine, codeine  Past surgical history:  Tubal ligation PFO closure C-section  Social history: Denies use of nicotine product (smoking, vaping, patches, smokeless) Alcohol use: Denies Denies recreational drug use   Physical Exam:  BMI of 33.5  General: no acute distress, appears stated age Neurologic: alert, answering questions appropriately, following commands Respiratory: unlabored breathing on room air, symmetric chest rise Psychiatric: appropriate affect, normal cadence to speech   MSK (spine):  -Strength exam      Left  Right EHL    4/5  4/5 TA    4/5  5/5 GSC    4/5  5/5 Knee extension  5/5  5/5 Hip flexion   5/5  5/5  -Sensory exam    Sensation intact to light touch in L3-S1 nerve distributions of bilateral lower extremities  2/4 DTRs in bilateral patella and Achilles  -Straight leg raise: Positive on the left, negative on the right -Femoral nerve stretch test: Negative bilaterally -Clonus: no beats  bilaterally  -Left hip exam: No pain through range of motion, negative Stinchfield, negative FABER -Right hip exam: No pain through range of motion, negative Stinchfield, negative FABER  Imaging: XR of the lumbar spine from 07/31/2022 was independently reviewed and interpreted, showing disc height loss at L4/5 and L5/S1.  No other significant degenerative changes.  No evidence of instability on flexion/extension views.  No fracture or dislocation seen.  MRI of the lumbar spine from 07/15/2022 was independently reviewed and interpreted, showing disc bulge at L4/5. Left paracentral disc herniation at L5/S1. 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    Patient name: Marissa [name] Patient MRN: [id number] Date of visit: 07/31/22

## 2022-08-04 ENCOUNTER — Encounter: Payer: Self-pay | Admitting: Family Medicine

## 2022-08-06 NOTE — Telephone Encounter (Signed)
Letter printed.

## 2022-08-12 ENCOUNTER — Ambulatory Visit: Payer: Medicaid Other | Admitting: Family Medicine

## 2022-08-13 ENCOUNTER — Telehealth: Payer: Self-pay | Admitting: Orthopedic Surgery

## 2022-08-13 ENCOUNTER — Encounter: Payer: Self-pay | Admitting: Family Medicine

## 2022-08-13 NOTE — Telephone Encounter (Signed)
Read note to patient from [facility] patient  stating Dr. Christell Constant did not take her out of work. Patient's PCP did. Patient said ok and disconnected call.

## 2022-08-13 NOTE — Telephone Encounter (Signed)
Patient called advised Marissa [name] need an after visit note faxed to them explaining why she is staying out of work.  Fax# [phone]  Case# [id number] Patient said she is unable to walk,stand due to weakness and numbness in her feet and legs.  The number to contact patient is [phone]

## 2022-08-19 NOTE — Telephone Encounter (Signed)
Letter typed and awaiting signature

## 2022-08-20 DIAGNOSIS — F447 Conversion disorder with mixed symptom presentation: Secondary | ICD-10-CM | POA: Diagnosis not present

## 2022-08-21 ENCOUNTER — Ambulatory Visit: Payer: Medicaid Other | Admitting: Obstetrics and Gynecology

## 2022-08-21 DIAGNOSIS — F447 Conversion disorder with mixed symptom presentation: Secondary | ICD-10-CM | POA: Diagnosis not present

## 2022-08-21 DIAGNOSIS — F459 Somatoform disorder, unspecified: Secondary | ICD-10-CM | POA: Diagnosis not present

## 2022-08-22 ENCOUNTER — Other Ambulatory Visit: Payer: Medicaid Other | Admitting: Obstetrics and Gynecology

## 2022-08-22 ENCOUNTER — Encounter: Payer: Self-pay | Admitting: Obstetrics and Gynecology

## 2022-08-22 ENCOUNTER — Telehealth: Payer: Self-pay | Admitting: Family Medicine

## 2022-08-22 NOTE — Telephone Encounter (Signed)
CAP forms Copied Noted Sleeved (put in providers box)

## 2022-08-22 NOTE — Patient Instructions (Signed)
Hi Ms. Thien, thanks for speaking with me and updating me-I hope you have a great rest of the day and great weekend!  [name] was given information about Medicaid Managed Care team care coordination services as a part of their Healthy Blue Medicaid benefit. Marissa [name] verbally consented to engagement with the [facility] Managed Care team.   If you are experiencing a medical emergency, please call 911 or report to your local emergency department or urgent care.   If you have a non-emergency medical problem during routine business hours, please contact your provider's office and ask to speak with a nurse.   For questions related to your Healthy [facility] health plan, please call: [phone] or visit the homepage here: [email or web address]  If you would like to schedule transportation through your Healthy [facility] plan, please call the following number at least 2 days in advance of your appointment: [phone]  For information about your ride after you set it up, call Ride Assist at [phone]. Use this number to activate a Will Call pickup, or if your transportation is late for a scheduled pickup. Use this number, too, if you need to make a change or cancel a previously scheduled reservation.  If you need transportation services right away, call [phone]. The after-hours call center is staffed 24 hours to handle ride assistance and urgent reservation requests (including discharges) 365 days a year. Urgent trips include sick visits, hospital discharge requests and life-sustaining treatment.  Call the [facility] Line at [phone], at any time, 24 hours a day, 7 days a week. If you are in danger or need immediate medical attention call 911.  If you would like help to quit smoking, call 1-800-QUIT-NOW ((801) 291-5824) OR Espaol: 1-855-Djelo-Ya (3-557-322-0254) o para ms informacin haga clic aqu or Text READY to 270-623 to  register via text  Marissa [name] - following are the goals we discussed in your visit today:   Goals Addressed    Timeframe:  Long-Range Goal Priority:  High Start Date:  06/18/22                           Expected End Date:    ongoing                   Follow Up Date: 09/25/22   - practice safe sex - schedule appointment for vaccines needed due to my age or health - schedule recommended health tests (blood work, mammogram, colonoscopy, pap test) - schedule and keep appointment for annual check-up    Why is this important?   Screening tests can find diseases early when they are easier to treat.  Your doctor or nurse will talk with you about which tests are important for you.  Getting shots for common diseases like the flu and shingles will help prevent them.  08/22/22-seen by [name] and [name]-has ORTHO appt Monday.  PT/ST ordered  Patient verbalizes understanding of instructions and care plan provided today and agrees to view in MyChart. Active MyChart status and patient understanding of how to access instructions and care plan via MyChart confirmed with patient.     The Managed Medicaid care management team will reach out to the patient again over the next 30 business  days.  The  Patient  has been provided with contact information for the Managed Medicaid care management team and has been advised to call with any health related questions or concerns.   Verble Styron Administrator, arts,  BSN [place]  [facility] Care Management Coordinator - Managed [place] High Risk [phone]   Following is a copy of your plan of care:  Care Plan : RN Care Manager Plan of Care  Updates made by [name], RN since 08/22/2022 12:00 AM     Problem: Health Promotion or Disease Self-Management (General Plan of Care)      Long-Range Goal: Chronic Disease Management and Care Coordination Needs   Start Date: 06/18/2022  Expected End Date: 11/22/2022  Priority: High  Note:   Current Barriers:   Knowledge Deficits related to plan of care for management of migraines, rhinitis, back pain, h/o CVA Care Coordination needs related to NEURO appointment Chronic Disease Management support and education needs related to migraines, rhinitis, back pain, h/o CVA 08/22/22:  Seen by [facility] and [name]-PT/ST ordered.  Has appt with ORTHO for injections-herniated disc causing leg pain.     RNCM Clinical Goal(s):  Patient will verbalize understanding of plan for management of migraines, rhinitis, back pain, h/o CVA as evidenced by patient report. verbalize basic understanding of migraines, rhinitis, back pain, h/o CVA disease process and self health management plan as evidenced by patient report. take all medications exactly as prescribed and will call provider for medication related questions as evidenced by patient report. demonstrate understanding of rationale for each prescribed medication as evidenced by patient report attend all scheduled medical appointments as evidenced by patient report and EMR review Demonstrate ongoing  adherence to prescribed treatment plan for migraines, rhinitis, back pain, h/o CVA as evidenced by patient report and EMR review continue to work with RN Care Manager to address care management and care coordination needs related to migraines, rhinitis, back pain, h/o CVA as evidenced by adherence to CM Team Scheduled appointments through collaboration with RN Care manager, provider, and care team.   Interventions: Inter-disciplinary care team collaboration (see longitudinal plan of care) Evaluation of current treatment plan related to  self management and patient's adherence to plan as established by provider   (Status:  New goal.)  Long Term Goal Evaluation of current treatment plan related to migraines, rhinitis, back pain, h/o CVA self-management and patient's adherence to plan as established by provider. Discussed plans with patient for ongoing care management follow up and  provided patient with direct contact information for care management team Reviewed medications with patient Collaborated with PCP  regarding appointment Reviewed scheduled/upcoming provider appointments Discussed plans with patient for ongoing care management follow up and provided patient with direct contact information for care management team Assessed social determinant of health barriers 06/18/22:  Message sent to provider to request assistance with NEURO appt.  Patient Goals/Self-Care Activities: Take all medications as prescribed Attend all scheduled provider appointments Call pharmacy for medication refills 3-7 days in advance of running out of medications Perform all self care activities independently  Perform IADL's (shopping, preparing meals, housekeeping, managing finances) independently Call provider office for new concerns or questions  08/22/22:  patient will f/u regarding therapy appts.  Follow Up Plan:  The patient has been provided with contact information for the care management team and has been advised to call with any health related questions or concerns.  The care management team will reach out to the patient again over the next 30 business  days.

## 2022-08-22 NOTE — Patient Outreach (Signed)
Medicaid Managed Care   Nurse Care Manager Note  08/22/2022 Name:  Marissa [name] MRN:  [id number] DOB:  08/31/1980  Marissa [name] is an 42 y.o. year old female who is a primary patient of Kerri Perches, MD.  The [facility] Managed Care Coordination team was consulted for assistance with:    Chronic healthcare management needs, migraines, rhinitis, chronic pain, h/o CVA, functional movement/neurological disorder, right arm tremor, herniated disc  Marissa [name] was given information about Medicaid Managed Care Coordination team services today. Marissa [name] Patient agreed to services and verbal consent obtained.  Engaged with patient by telephone for follow up visit in response to provider referral for case management and/or care coordination services.   Assessments/Interventions:  Review of past medical history, allergies, medications, health status, including review of consultants reports, laboratory and other test data, was performed as part of comprehensive evaluation and provision of chronic care management services.  SDOH (Social Determinants of Health) assessments and interventions performed: SDOH Interventions    Flowsheet Row Patient Outreach Telephone from 08/22/2022 in [place] POPULATION HEALTH DEPARTMENT Patient Outreach Telephone from 07/21/2022 in [place] POPULATION HEALTH DEPARTMENT Patient Outreach Telephone from 06/18/2022 in [place] POPULATION HEALTH DEPARTMENT Office Visit from 03/14/2022 in [facility]  SDOH Interventions      Food Insecurity Interventions -- -- Intervention Not Indicated --  Housing Interventions -- -- Intervention Not Indicated --  Transportation Interventions -- -- Intervention Not Indicated --  Utilities Interventions -- -- Intervention Not Indicated --  Alcohol Usage Interventions -- Intervention Not Indicated (Score <7) -- --  Depression Interventions/Treatment  -- -- -- Counseling, [id number] Referral   Financial Strain Interventions Intervention Not Indicated -- -- --  Health Literacy Interventions Intervention Not Indicated -- -- --     Care Plan Allergies  Allergen Reactions   Triptans     Cannot use triptans for migraine per neurology due to stroke history   Morphine And Codeine Hives    Feels like skin is burning    Medications Reviewed Today     Reviewed by [name], RN (Registered Nurse) on 08/22/22 at 1436  Med List Status: <None>   Medication Order Taking? Sig Documenting Provider Last Dose Status Informant  ALPRAZolam (XANAX) 0.5 MG tablet [id number] No Take 1 tablet (0.5 mg total) by mouth at bedtime as needed for anxiety. Take for mri  Patient not taking: Reported on 07/21/2022   Vickki Hearing, MD Not Taking Active   butalbital-acetaminophen-caffeine [facility]) [phone] MG tablet [id number]  Take one tablet by mouth twice daily, as needed, for headache. Maximum is twice per week Kerri Perches, MD  Active   ergocalciferol (VITAMIN D2) 1.25 MG (50000 UT) capsule [id number] No Take 1 capsule (50,000 Units total) by mouth once a week. One capsule once weekly Kerri Perches, MD Taking Active   predniSONE (DELTASONE) 10 MG tablet [id number] No Take 1 tablet (10 mg total) by mouth 2 (two) times daily with a meal.  Patient not taking: Reported on 07/14/2022   Kerri Perches, MD Not Taking Active   pregabalin (LYRICA) 75 MG capsule [id number]  Take 1 capsule (75 mg total) by mouth 2 (two) times daily. London Sheer, MD  Active            Patient Active Problem List   Diagnosis Date Noted   Acute back pain with radiculopathy 07/06/2022   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  H/O Amplatzer atrial septal defect closure 03/25/2022   Depression, major, single episode, moderate (HCC) 03/16/2022   Allergic rhinitis 03/16/2022   Abnormal EKG 03/03/2022   Encounter for examination following treatment at hospital  03/03/2022   Abnormal MRA, brain 10/25/2021   Back pain with right-sided radiculopathy 12/12/2020   Neurological disorder 09/07/2020   Speech disturbance 09/03/2020   Spell of abnormal behavior    Abnormal MRI of head    TIA (transient ischemic attack) 08/13/2020   History of CVA (cerebrovascular accident) 12/09/2018   Anxiety 12/09/2018   Obesity (BMI 30.0-34.9) 12/09/2018   Common migraine with intractable migraine 11/08/2015   PFO (patent foramen ovale) 10/08/2015   Vitamin D deficiency 10/08/2015   Conditions to be addressed/monitored per PCP order:  Chronic healthcare management needs, migraines, rhinitis, chronic pain, h/o CVA, functional movement/neurological disorder, right arm tremor, herniated disc  Care Plan : RN Care Manager Plan of Care  Updates made by [name], RN since 08/22/2022 12:00 AM     Problem: Health Promotion or Disease Self-Management (General Plan of Care)      Long-Range Goal: Chronic Disease Management and Care Coordination Needs   Start Date: 06/18/2022  Expected End Date: 11/22/2022  Priority: High  Note:   Current Barriers:  Knowledge Deficits related to plan of care for management of migraines, rhinitis, back pain, h/o CVA Care Coordination needs related to NEURO appointment Chronic Disease Management support and education needs related to migraines, rhinitis, back pain, h/o CVA 08/22/22:  Seen by [facility] and [name]-PT/ST ordered.  Has appt with ORTHO for injections-herniated disc causing leg pain.     RNCM Clinical Goal(s):  Patient will verbalize understanding of plan for management of migraines, rhinitis, back pain, h/o CVA as evidenced by patient report. verbalize basic understanding of migraines, rhinitis, back pain, h/o CVA disease process and self health management plan as evidenced by patient report. take all medications exactly as prescribed and will call provider for medication related questions as evidenced by patient report. demonstrate  understanding of rationale for each prescribed medication as evidenced by patient report attend all scheduled medical appointments as evidenced by patient report and EMR review Demonstrate ongoing  adherence to prescribed treatment plan for migraines, rhinitis, back pain, h/o CVA as evidenced by patient report and EMR review continue to work with RN Care Manager to address care management and care coordination needs related to migraines, rhinitis, back pain, h/o CVA as evidenced by adherence to CM Team Scheduled appointments through collaboration with RN Care manager, provider, and care team.   Interventions: Inter-disciplinary care team collaboration (see longitudinal plan of care) Evaluation of current treatment plan related to  self management and patient's adherence to plan as established by provider   (Status:  New goal.)  Long Term Goal Evaluation of current treatment plan related to migraines, rhinitis, back pain, h/o CVA self-management and patient's adherence to plan as established by provider. Discussed plans with patient for ongoing care management follow up and provided patient with direct contact information for care management team Reviewed medications with patient Collaborated with PCP  regarding appointment Reviewed scheduled/upcoming provider appointments Discussed plans with patient for ongoing care management follow up and provided patient with direct contact information for care management team Assessed social determinant of health barriers 06/18/22:  Message sent to provider to request assistance with NEURO appt.  Patient Goals/Self-Care Activities: Take all medications as prescribed Attend all scheduled provider appointments Call pharmacy for medication refills 3-7 days in advance of running  out of medications Perform all self care activities independently  Perform IADL's (shopping, preparing meals, housekeeping, managing finances) independently Call provider office for  new concerns or questions  08/22/22:  patient will f/u regarding therapy appts.  Follow Up Plan:  The patient has been provided with contact information for the care management team and has been advised to call with any health related questions or concerns.  The care management team will reach out to the patient again over the next 30 business  days.   Long-Range Goal: Establish Plan of care for Chronic Disease Management Needs and Care Coordination Needs   Priority: High  Note:   Timeframe:  Long-Range Goal Priority:  High Start Date:  06/18/22                           Expected End Date:    ongoing                   Follow Up Date: 09/25/22   - practice safe sex - schedule appointment for vaccines needed due to my age or health - schedule recommended health tests (blood work, mammogram, colonoscopy, pap test) - schedule and keep appointment for annual check-up    Why is this important?   Screening tests can find diseases early when they are easier to treat.  Your doctor or nurse will talk with you about which tests are important for you.  Getting shots for common diseases like the flu and shingles will help prevent them.  08/22/22-seen by Marissa [name] and [name]-has ORTHO appt Monday.  PT/ST ordered   Follow Up:  Patient agrees to Care Plan and Follow-up.  Plan: The Managed Medicaid care management team will reach out to the patient again over the next 30 business  days. and The  Patient has been provided with contact information for the Managed Medicaid care management team and has been advised to call with any health related questions or concerns.  Date/time of next scheduled RN care management/care coordination outreach: 09/25/22 at 0900.

## 2022-08-25 ENCOUNTER — Other Ambulatory Visit: Payer: Self-pay

## 2022-08-25 ENCOUNTER — Ambulatory Visit: Payer: Medicaid Other | Admitting: Physical Medicine and Rehabilitation

## 2022-08-25 VITALS — BP 137/87 | HR 85

## 2022-08-25 DIAGNOSIS — M5416 Radiculopathy, lumbar region: Secondary | ICD-10-CM | POA: Diagnosis not present

## 2022-08-25 MED ORDER — METHYLPREDNISOLONE ACETATE 80 MG/ML IJ SUSP
80.0000 mg | Freq: Once | INTRAMUSCULAR | Status: AC
  Administered 2022-08-25: 80 mg

## 2022-08-25 NOTE — Patient Instructions (Signed)

## 2022-08-25 NOTE — Progress Notes (Signed)
Functional Pain Scale - descriptive words and definitions  Distressing (6)    Pain is present/unable to complete most ADLs limited by pain/sleep is difficult and active distraction is only marginal. Moderate range order  Average Pain 9   +Driver, -BT, -Dye Allergies.  Lower back pain on left side that radiates into the left leg down the back of the leg to the foot

## 2022-08-26 DIAGNOSIS — Z0279 Encounter for issue of other medical certificate: Secondary | ICD-10-CM

## 2022-08-28 ENCOUNTER — Ambulatory Visit: Payer: Medicaid Other | Admitting: Orthopedic Surgery

## 2022-08-29 ENCOUNTER — Telehealth: Payer: Self-pay | Admitting: Family Medicine

## 2022-08-29 NOTE — Telephone Encounter (Signed)
Attending Physicians statement   Noted  Copied Sleeved  Original in PCP box Copy front desk folder

## 2022-09-01 NOTE — Telephone Encounter (Signed)
Form completed and signed by m on 09/01/2022, left at nurse's station

## 2022-09-02 ENCOUNTER — Encounter: Payer: Self-pay | Admitting: Family Medicine

## 2022-09-04 ENCOUNTER — Encounter: Payer: Self-pay | Admitting: Family Medicine

## 2022-09-05 NOTE — Telephone Encounter (Signed)
Patient mother picked up forms

## 2022-09-06 NOTE — Procedures (Signed)
S1 Lumbosacral Transforaminal Epidural Steroid Injection - Sub-Pedicular Approach with Fluoroscopic Guidance   Patient: Marissa [name]      Date of Birth: 1980/07/24 MRN: [id number] PCP: Kerri Perches, MD      Visit Date: 08/25/2022   Universal Protocol:    Date/Time: 08/17/246:29 PM  Consent Given By: the patient  Position:  PRONE  Additional Comments: Vital signs were monitored before and after the procedure. Patient was prepped and draped in the usual sterile fashion. The correct patient, procedure, and site was verified.   Injection Procedure Details:  Procedure Site One Meds Administered:  Meds ordered this encounter  Medications   methylPREDNISolone acetate (DEPO-MEDROL) injection 80 mg    Laterality: Left  Location/Site:  S1 Foramen   Needle size: 22 ga.  Needle type: Spinal  Needle Placement: Transforaminal  Findings:   -Comments: Excellent flow of contrast along the nerve, nerve root and into the epidural space.  Epidurogram: Contrast epidurogram showed no nerve root cut off or restricted flow pattern.  Procedure Details: After squaring off the sacral end-plate to get a true AP view, the C-arm was positioned so that the best possible view of the S1 foramen was visualized. The soft tissues overlying this structure were infiltrated with 2-3 ml. of 1% Lidocaine without Epinephrine.    The spinal needle was inserted toward the target using a "trajectory" view along the fluoroscope beam.  Under AP and lateral visualization, the needle was advanced so it did not puncture dura. Biplanar projections were used to confirm position. Aspiration was confirmed to be negative for CSF and/or blood. A 1-2 ml. volume of Isovue-250 was injected and flow of contrast was noted at each level. Radiographs were obtained for documentation purposes.   After attaining the desired flow of contrast documented above, a 0.5 to 1.0 ml test dose of 0.25% Marcaine was injected into  each respective transforaminal space.  The patient was observed for 90 seconds post injection.  After no sensory deficits were reported, and normal lower extremity motor function was noted,   the above injectate was administered so that equal amounts of the injectate were placed at each foramen (level) into the transforaminal epidural space.   Additional Comments:  No complications occurred Dressing: Band-Aid with 2 x 2 sterile gauze    Post-procedure details: Patient was observed during the procedure. Post-procedure instructions were reviewed.  Patient left the clinic in stable condition.

## 2022-09-06 NOTE — Progress Notes (Signed)
Marissa [name] - 42 y.o. female MRN [id number]  Date of birth: 1980-09-06  Office Visit Note: Visit Date: 08/25/2022 PCP: Kerri Perches, MD Referred by: London Sheer, MD  Subjective: Chief Complaint  Patient presents with   Lower Back - Pain   HPI:  Marissa [name] is a 42 y.o. female who comes in today at the request of Dr. Willia Craze for planned Left S1-2 Lumbar Transforaminal epidural steroid injection with fluoroscopic guidance.  The patient has failed conservative care including home exercise, medications, time and activity modification.  This injection will be diagnostic and hopefully therapeutic.  Please see requesting physician notes for further details and justification. Clear S1 dermatome symptoms with L5-S1 paracentral protrusion and left lateral recess at L5-S1.   ROS Otherwise per HPI.  Assessment & Plan: Visit Diagnoses:    ICD-10-CM   1. Lumbar radiculopathy  M54.16 XR C-ARM NO REPORT    Epidural Steroid injection    methylPREDNISolone acetate (DEPO-MEDROL) injection 80 mg      Plan: No additional findings.   Meds & Orders:  Meds ordered this encounter  Medications   methylPREDNISolone acetate (DEPO-MEDROL) injection 80 mg    Orders Placed This Encounter  Procedures   XR C-ARM NO REPORT   Epidural Steroid injection    Follow-up: Return for visit to requesting provider as needed.   Procedures: No procedures performed  S1 Lumbosacral Transforaminal Epidural Steroid Injection - Sub-Pedicular Approach with Fluoroscopic Guidance   Patient: Marissa [name]      Date of Birth: [birth year] MRN: [id number] PCP: Kerri Perches, MD      Visit Date: 08/25/2022   Universal Protocol:    Date/Time: 08/17/246:29 PM  Consent Given By: the patient  Position:  PRONE  Additional Comments: Vital signs were monitored before and after the procedure. Patient was prepped and draped in the usual sterile fashion. The correct patient, procedure,  and site was verified.   Injection Procedure Details:  Procedure Site One Meds Administered:  Meds ordered this encounter  Medications   methylPREDNISolone acetate (DEPO-MEDROL) injection 80 mg    Laterality: Left  Location/Site:  S1 Foramen   Needle size: 22 ga.  Needle type: Spinal  Needle Placement: Transforaminal  Findings:   -Comments: Excellent flow of contrast along the nerve, nerve root and into the epidural space.  Epidurogram: Contrast epidurogram showed no nerve root cut off or restricted flow pattern.  Procedure Details: After squaring off the sacral end-plate to get a true AP view, the C-arm was positioned so that the best possible view of the S1 foramen was visualized. The soft tissues overlying this structure were infiltrated with 2-3 ml. of 1% Lidocaine without Epinephrine.    The spinal needle was inserted toward the target using a "trajectory" view along the fluoroscope beam.  Under AP and lateral visualization, the needle was advanced so it did not puncture dura. Biplanar projections were used to confirm position. Aspiration was confirmed to be negative for CSF and/or blood. A 1-2 ml. volume of Isovue-250 was injected and flow of contrast was noted at each level. Radiographs were obtained for documentation purposes.   After attaining the desired flow of contrast documented above, a 0.5 to 1.0 ml test dose of 0.25% Marcaine was injected into each respective transforaminal space.  The patient was observed for 90 seconds post injection.  After no sensory deficits were reported, and normal lower extremity motor function was noted,   the above injectate was administered so  that equal amounts of the injectate were placed at each foramen (level) into the transforaminal epidural space.   Additional Comments:  No complications occurred Dressing: Band-Aid with 2 x 2 sterile gauze    Post-procedure details: Patient was observed during the procedure. Post-procedure  instructions were reviewed.  Patient left the clinic in stable condition.   Clinical History: MRI LUMBAR SPINE WITHOUT CONTRAST   TECHNIQUE: Multiplanar, multisequence MR imaging of the lumbar spine was performed. No intravenous contrast was administered.   COMPARISON:  Lumbar spine radiographs 12/05/2020   FINDINGS: Segmentation:  Standard.   Alignment:  Normal.   Vertebrae: No fracture or suspicious marrow lesion. Moderate degenerative endplate changes at L5-S1, Modic type 2 greater than 1.   Conus medullaris and cauda equina: Conus extends to the upper L1 level. Conus and cauda equina appear normal.   Paraspinal and other soft tissues: Unremarkable.   Disc levels:   T12-L1 and L1-2: Negative.   L2-3: Minimal disc bulging without stenosis.   L3-4: Mild disc bulging and mild facet hypertrophy without stenosis.   L4-5: Disc desiccation and mild-to-moderate disc space narrowing. Disc bulging, a central disc protrusion, and moderate facet and ligamentum flavum hypertrophy result in mild spinal stenosis, mild bilateral lateral recess stenosis, and mild bilateral neural foraminal stenosis.   L5-S1: Disc desiccation and moderate disc space narrowing. Disc bulging, a large central to left subarticular disc protrusion, and mild facet hypertrophy result in mild spinal stenosis, mild right and severe left lateral recess stenosis, and mild left neural foraminal stenosis, likely with left S1 nerve root impingement.   IMPRESSION: 1. Large disc protrusion at L5-S1 with left lateral recess stenosis and S1 nerve root impingement. 2. Mild spinal and neural foraminal stenosis at L4-5.     Electronically Signed   By: Sebastian Ache M.D.   On: 07/21/2022 18:43     Objective:  VS:  HT:    WT:   BMI:     BP:137/87  HR:85bpm  TEMP: ( )  RESP:  Physical Exam   Imaging: No results found.

## 2022-09-08 DIAGNOSIS — F447 Conversion disorder with mixed symptom presentation: Secondary | ICD-10-CM | POA: Diagnosis not present

## 2022-09-08 DIAGNOSIS — F445 Conversion disorder with seizures or convulsions: Secondary | ICD-10-CM | POA: Diagnosis not present

## 2022-09-10 ENCOUNTER — Telehealth: Payer: Self-pay | Admitting: Family Medicine

## 2022-09-10 NOTE — Telephone Encounter (Signed)
Form for [facility] completed and is in your box, pls copy and keep where  we can access as usual , also copy of CAP form returned Pls keep pt's printed list of upcomming appts up to  02/2023 as well, thanks

## 2022-09-11 NOTE — Telephone Encounter (Signed)
Forms copied and faxed

## 2022-09-17 ENCOUNTER — Ambulatory Visit (HOSPITAL_COMMUNITY): Payer: Medicaid Other

## 2022-09-17 ENCOUNTER — Other Ambulatory Visit: Payer: Self-pay

## 2022-09-17 DIAGNOSIS — M79604 Pain in right leg: Secondary | ICD-10-CM | POA: Diagnosis not present

## 2022-09-17 DIAGNOSIS — R29898 Other symptoms and signs involving the musculoskeletal system: Secondary | ICD-10-CM | POA: Insufficient documentation

## 2022-09-17 DIAGNOSIS — M79605 Pain in left leg: Secondary | ICD-10-CM | POA: Insufficient documentation

## 2022-09-17 DIAGNOSIS — M6281 Muscle weakness (generalized): Secondary | ICD-10-CM | POA: Diagnosis not present

## 2022-09-17 DIAGNOSIS — R262 Difficulty in walking, not elsewhere classified: Secondary | ICD-10-CM | POA: Insufficient documentation

## 2022-09-17 NOTE — Therapy (Addendum)
OUTPATIENT PHYSICAL THERAPY LOWER EXTREMITY EVALUATION   Patient Name: Marissa [name] MRN: [id number] DOB:11/29/1980, 42 y.o., female Today's Date: 09/17/2022  END OF SESSION:  PT End of Session - 09/17/22 1605     Visit Number 1    Number of Visits 8    Date for PT Re-Evaluation 11/12/22    Authorization Type [place] [employer] (requested 8 visits on 09/18/22)    PT Start Time 1035    PT Stop Time 1130    PT Time Calculation (min) 55 min    Equipment Utilized During Treatment Gait belt    Activity Tolerance Treatment limited secondary to medical complications (Comment)   symptoms of conversion disorder   Behavior During Therapy --   teary-eyed           Past Medical History:  Diagnosis Date   Allergy    seasonal   Anxiety    Back pain with right-sided radiculopathy 12/12/2020   Cerebrovascular accident (CVA) due to embolism (HCC) 10/08/2015   R  frontal   Common migraine with intractable migraine 11/08/2015   Encounter for examination following treatment at hospital 03/03/2022   Encounter for gynecological examination with Papanicolaou smear of cervix 12/30/2018   Hyperlipidemia    Stroke (HCC)    Vitamin D deficiency    Past Surgical History:  Procedure Laterality Date   CESAREAN SECTION     PATENT FORAMEN OVALE CLOSURE  09/25/2015   TUBAL LIGATION     Patient Active Problem List   Diagnosis Date Noted   Acute back pain with radiculopathy 07/06/2022   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   H/O Amplatzer atrial septal defect closure 03/25/2022   Depression, major, single episode, moderate (HCC) 03/16/2022   Allergic rhinitis 03/16/2022   Abnormal EKG 03/03/2022   Encounter for examination following treatment at hospital 03/03/2022   Abnormal MRA, brain 10/25/2021   Back pain with right-sided radiculopathy 12/12/2020   Neurological disorder 09/07/2020   Speech disturbance 09/03/2020   Spell of abnormal behavior     Abnormal MRI of head    TIA (transient ischemic attack) 08/13/2020   History of CVA (cerebrovascular accident) 12/09/2018   Anxiety 12/09/2018   Obesity (BMI 30.0-34.9) 12/09/2018   Common migraine with intractable migraine 11/08/2015   PFO (patent foramen ovale) 10/08/2015   Vitamin D deficiency 10/08/2015    PCP: Kerri Perches MD  REFERRING PROVIDER: Peyton Bottoms, MD  REFERRING DIAG: F44.7 (ICD-10-CM) - Conversion disorder with mixed symptom presentation  THERAPY DIAG:  Muscle weakness (generalized) - Plan: PT plan of care cert/re-cert  Difficulty in walking, not elsewhere classified - Plan: PT plan of care cert/re-cert  Other symptoms and signs involving the musculoskeletal system - Plan: PT plan of care cert/re-cert  Pain in both lower extremities - Plan: PT plan of care cert/re-cert  Rationale for Evaluation and Treatment: Rehabilitation  ONSET DATE: Years ago  SUBJECTIVE:   SUBJECTIVE STATEMENT: Arrives to the clinic with weakness and numbness on B legs, tremors on R hand, and difficulty with walking/standing. Patient also reports that the body is stiffening a lot. Condition started years before and had PT. However, patient did not complete PT due to issues with scheduling. MRI of the lumbar spine and brain was done recently (see results below). Patient was then referred by her neurologist to outpatient PT evaluation and management with a diagnosis of conversion disorder.  PERTINENT HISTORY: Hx of herniated lumbar disc PAIN:  Are you having pain? Yes:  NPRS scale: 8/10 Pain location: L LE Pain description: burning and tingling, tooth-ache like Aggravating factors: 2 min of standing/walking Relieving factors: heating pad  PRECAUTIONS: Fall  RED FLAGS: Bowel or bladder incontinence: No and Spinal tumors: No   WEIGHT BEARING RESTRICTIONS: No  FALLS:  Has patient fallen in last 6 months? Yes. Number of falls 12  LIVING ENVIRONMENT: Lives with:   kids and husband Lives in: House/apartment (2nd floor) Stairs: Yes: External: 12-15 steps; bilateral but cannot reach both Has following equipment at home: Single point cane and standard walker  OCCUPATION: used to work at [employer] at [employer]  PLOF: Independent and Independent with basic ADLs  PATIENT GOALS: "trying to get back like I was"  NEXT MD VISIT: January 2025  OBJECTIVE:   DIAGNOSTIC FINDINGS:  07/21/22 CLINICAL DATA:  Lumbar radiculopathy, symptoms persist with > 6 wks treatment. Low back and left leg pain.   EXAM: MRI LUMBAR SPINE WITHOUT CONTRAST   TECHNIQUE: Multiplanar, multisequence MR imaging of the lumbar spine was performed. No intravenous contrast was administered.   COMPARISON:  Lumbar spine radiographs 12/05/2020   FINDINGS: Segmentation:  Standard.   Alignment:  Normal.   Vertebrae: No fracture or suspicious marrow lesion. Moderate degenerative endplate changes at L5-S1, Modic type 2 greater than 1.   Conus medullaris and cauda equina: Conus extends to the upper L1 level. Conus and cauda equina appear normal.   Paraspinal and other soft tissues: Unremarkable.   Disc levels:   T12-L1 and L1-2: Negative.   L2-3: Minimal disc bulging without stenosis.   L3-4: Mild disc bulging and mild facet hypertrophy without stenosis.   L4-5: Disc desiccation and mild-to-moderate disc space narrowing. Disc bulging, a central disc protrusion, and moderate facet and ligamentum flavum hypertrophy result in mild spinal stenosis, mild bilateral lateral recess stenosis, and mild bilateral neural foraminal stenosis.   L5-S1: Disc desiccation and moderate disc space narrowing. Disc bulging, a large central to left subarticular disc protrusion, and mild facet hypertrophy result in mild spinal stenosis, mild right and severe left lateral recess stenosis, and mild left neural foraminal stenosis, likely with left S1 nerve root impingement.    IMPRESSION: 1. Large disc protrusion at L5-S1 with left lateral recess stenosis and S1 nerve root impingement. 2. Mild spinal and neural foraminal stenosis at L4-5.  04/30/22 CLINICAL DATA:  Neuro deficit, acute, stroke suspected.   EXAM: MRI HEAD WITHOUT CONTRAST   TECHNIQUE: Multiplanar, multiecho pulse sequences of the brain and surrounding structures were obtained without intravenous contrast.   COMPARISON:  Head CT earlier same day.  MRI 02/21/2022   FINDINGS: Brain: The brain has a normal appearance without evidence of malformation, atrophy, old or acute small or large vessel infarction, mass lesion, hemorrhage, hydrocephalus or extra-axial collection.   Vascular: Major vessels at the base of the brain show flow. Venous sinuses appear patent.   Skull and upper cervical spine: Normal.   Sinuses/Orbits: Clear/normal.   Other: None significant.   IMPRESSION: Normal examination. No abnormality seen to explain the presenting symptoms.  PATIENT SURVEYS:  LEFS 10/80 = 12.5%  COGNITION: Overall cognitive status: Within functional limits for tasks assessed     SENSATION: WFL  COORDINATION:  Heel-to-shin: impaired  Alternating foot taps: WFL  MUSCLE LENGTH: Hamstrings: Right mild restriction deg; Left mild restriction  POSTURE: rounded shoulders and forward head  PALPATION: Grade 2 tenderness on major muscle mass of B LE  LOWER EXTREMITY ROM:  Active ROM Right eval Left eval  Hip flexion [facility]  Hip extension    Hip abduction [facility]  Hip adduction [facility]  Hip internal rotation    Hip external rotation    Knee flexion 25% limited* 25% limited*  Knee extension 25% limited* 25% limited*  Ankle dorsiflexion 25% limited* 25% limited*  Ankle plantarflexion 25% limited* 25% limited*  Ankle inversion    Ankle eversion     (Blank rows = not tested)  *due to pain and weakness  LOWER EXTREMITY MMT:  MMT Right eval Left eval  Hip flexion 4 4  Hip  extension    Hip abduction 4 4  Hip adduction 4 4  Hip internal rotation    Hip external rotation    Knee flexion 2+ 2+  Knee extension 2+ 2+  Ankle dorsiflexion 2+ 2+  Ankle plantarflexion 2+ 2+  Ankle inversion    Ankle eversion     (Blank rows = not tested)   FUNCTIONAL TESTS:  5 times sit to stand: 39 sec with patient holding to thighs and SPC and some assist from PT 2 minute walk test: 25 ft (Patient fainted and passed out after 1 min. Patient did not fall but was slowly sat in a chair. BP = 140/94 mmHg, SpO2 = 98%, HR = 80 bpm  GAIT: Distance walked: 25 ft Assistive device utilized: Single point cane on the R Level of assistance: CGA Comments: decreased step length on B, decreased swing phase on B, slightly wide BOS, mild to moderate unsteadiness seen.   TODAY'S TREATMENT:                                                                                                                              DATE:  09/17/22 Evaluation and patient education   PATIENT EDUCATION:  Education details: Educated on the effects of conversion disorder with movement and function.. Educated on the goals and course of rehab. Written HEP provided and reviewed  Person educated: Patient and Parent Education method: Verbal cues Education comprehension: returned demonstration  HOME EXERCISE PROGRAM: None provided to this date  ASSESSMENT:  CLINICAL IMPRESSION: Patient is a 42 y.o. female who was seen today for physical therapy evaluation and treatment for conversion disorder. Patient was diagnosed with conversion disorder by referring provider further defined by difficulty with walking and stair negotiation due to pain, weakness, and decreased soft tissue extensibility. Skilled PT is required to address the impairments and functional limitations listed below.  Patient tolerated today's session [facility] for the tasks performed except when doing the 5x STS where patient demonstrated progressive weakness  and required several attempts to perform a successful trial with increasing repetition. Patient reported at the end of the task the she's having an "attack". Patient was informed about not continuing with the due to progressive weakness but patient insisted to continue. After a minute of , patient fainted. Patient did not fall but was slowly sat in a chair. BP = 140/94  mmHg, SpO2 = 98%, HR = 80 bpm. To aid with recovery, patient was instructed to do slow deep breaths. After 10-15 minutes, patient fully recovered and was back to her consciousness and was able to walk out of the clinic with her [facility].  OBJECTIVE IMPAIRMENTS: Abnormal gait, decreased activity tolerance, decreased balance, decreased coordination, difficulty walking, decreased strength, impaired flexibility, and pain.   ACTIVITY LIMITATIONS: carrying, lifting, bending, squatting, stairs, transfers, bathing, toileting, dressing, self feeding, hygiene/grooming, locomotion level, and caring for others  PARTICIPATION LIMITATIONS: meal prep, cleaning, laundry, driving, shopping, occupation, and yard work  PERSONAL FACTORS: Behavior pattern, Time since onset of injury/illness/exacerbation, and 1-2 comorbidities: back pain and anxiety  are also affecting patient's functional outcome.   REHAB POTENTIAL: Fair    CLINICAL DECISION MAKING: Unstable/unpredictable  EVALUATION COMPLEXITY: High   GOALS: Goals reviewed with patient? Yes  SHORT TERM GOALS: Target date: 10/15/22 Pt will demonstrate indep in HEP to facilitate carry-over of skilled services and improve functional outcomes  Goal status: INITIAL  LONG TERM GOALS: Target date: 11/12/22.  Pt will decrease 5TSTS by at least 3 seconds in order to demonstrate clinically significant improvement in LE strength and balance  Baseline: 39 sec Goal status: INITIAL  2.  Pt will increase by at least 40 ft in order to demonstrate clinically significant improvement in community  ambulation  Baseline: 25 ft Goal status: INITIAL  3.  Pt will demonstrate increase in LE strength to 4-/5 to facilitate ease and safety in ambulation  Baseline: 2+ Goal status: INITIAL  4.  Pt will demonstrate improved flexibility in the LEs to slight restriction facilitate ease in ambulation and ADLs  Baseline: mild restriction Goal status: INITIAL  5.  Patient will demonstrate improved coordination as manifested by performing heel-to-shin accurately on B in 2/4 trials. Baseline: impaired Goal status: INITIAL   PLAN:  PT FREQUENCY: 1x/week  PT DURATION: 8 weeks  PLANNED INTERVENTIONS: Therapeutic exercises, Therapeutic activity, Neuromuscular re-education, Balance training, Gait training, Patient/Family education, Self Care, and Stair training  PLAN FOR NEXT SESSION: Provide HEP. Begin LE strengthening, flexibility, balance and gait activities.   Tish Frederickson. Shauntel Prest, PT, DPT, OCS Board-Certified Clinical Specialist in Orthopedic PT PT Compact Privilege # ([place]): [id number] T 09/17/2022, 5:19 PM

## 2022-09-19 ENCOUNTER — Ambulatory Visit: Payer: Medicaid Other | Admitting: Orthopedic Surgery

## 2022-09-19 DIAGNOSIS — M5416 Radiculopathy, lumbar region: Secondary | ICD-10-CM | POA: Diagnosis not present

## 2022-09-19 NOTE — Progress Notes (Addendum)
Orthopedic Spine Surgery Office Note   Assessment: Patient is a 42 y.o. female with low back pain that radiates into the left posterior thigh and leg into the plantar aspect of the foot.  Has a disc herniation at L5/S1 causing S1 radiculopathy     Plan: -Patient has tried Tylenol, ibuprofen, prednisone, heating pad, lyrica, lumbar injection -I went over all of her nonoperative treatment options.  I told her that she has tried almost all of them besides gabapentin.  She wanted to try the gabapentin so this was prescribed to her today -I told her that if the gabapentin does not work then her options would be pain management or a microdiscectomy -I covered the risks, benefits, alternatives of microdiscectomy with her today.  She was not quite ready to proceed with that treatment.  She said she would call the office after trying the gabapentin to let me know how she would like to go forward with her treatment -Patient will call the office to update me on her symptoms     The patient has left sided radiculopathy due to her herniated disc at L5/S1. This patient has tried conservative treatment now for several months without any relief of their symptoms. Accordingly, discussed surgery in the form of microdiscectomy as a treatment option. The risks of the surgery including but not limited to recurrent disc herniation, persistent pain, dural tear, nerve root injury, foot drop or weak plantarflexion, infection, bleeding, fracture, instability, need for additional procedures, heart attack, and death were discussed with the patient. The benefits of the surgery would be faster relief of the patient's symptoms that are due to the herniated disc which would be the leg pain. I explained that back pain relief is not the goal of the surgery and it is not reliably alleviated with this surgery. The alternatives to surgical management were covered with the patient and included activity modification, physical therapy,  gabapentin/lyrica, over-the-counter pain medications, and injections.  All the patient's questions were answered to her satisfaction. After this discussion, the patient expressed understanding and wanted to wait on surgery. She said she would call to let me know how she does with gabapentin and if she would like to proceed with surgery or pain management if it does not work.    ___________________________________________________________________________     History:   Patient is a 42 y.o. female who presents today for follow up on her lumbar spine.  After last visit, patient tried Lyrica but she found that it made her jittery and she could not tolerate the side effects.  She also got a lumbar injection that provided her with good relief but only for a couple of days and then pain returned.  She is feeling pain in her low back and in the left leg.  She feels the pain mostly in the posterior aspect of the calf and the plantar aspect of the foot on the left side.  She is not having a right lower extremity pain.  Pain is mostly felt in the leg as opposed to the back.    Treatments tried: Tylenol, ibuprofen, prednisone, heating pad, lyrica, lumbar injection     Physical Exam:   General: no acute distress, appears stated age Neurologic: alert, answering questions appropriately, following commands Respiratory: unlabored breathing on room air, symmetric chest rise Psychiatric: appropriate affect, normal cadence to speech     MSK (spine):   -Strength exam  Left                  Right EHL                              4/5                  4/5 TA                                 4/5                  5/5 GSC                             4/5                  5/5 Knee extension            5/5                  5/5 Hip flexion                    5/5                  5/5   Showed cogwheel weakness with TA testing on the left  -Sensory exam                            Sensation intact to light touch in L3-S1 nerve distributions of bilateral lower extremities   2/4 DTRs in bilateral patella and Achilles     Imaging: XR of the lumbar spine from 07/31/2022 was previously independently reviewed and interpreted, showing disc height loss at L4/5 and L5/S1.  No other significant degenerative changes.  No evidence of instability on flexion/extension views.  No fracture or dislocation seen.   MRI of the lumbar spine from 07/15/2022 was previously independently reviewed and interpreted, showing disc bulge at L4/5. Left paracentral disc herniation at L5/S1. 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      Patient name: Marissa [name] Patient MRN: [id number] Date of visit: 09/19/22

## 2022-09-25 ENCOUNTER — Other Ambulatory Visit: Payer: Self-pay | Admitting: Family Medicine

## 2022-09-25 ENCOUNTER — Ambulatory Visit (HOSPITAL_COMMUNITY): Payer: Medicaid Other | Attending: Neurology

## 2022-09-25 ENCOUNTER — Encounter: Payer: Self-pay | Admitting: Orthopedic Surgery

## 2022-09-25 ENCOUNTER — Encounter: Payer: Self-pay | Admitting: Obstetrics and Gynecology

## 2022-09-25 ENCOUNTER — Other Ambulatory Visit: Payer: Medicaid Other | Admitting: Obstetrics and Gynecology

## 2022-09-25 DIAGNOSIS — M79604 Pain in right leg: Secondary | ICD-10-CM | POA: Insufficient documentation

## 2022-09-25 DIAGNOSIS — M6281 Muscle weakness (generalized): Secondary | ICD-10-CM | POA: Insufficient documentation

## 2022-09-25 DIAGNOSIS — R29898 Other symptoms and signs involving the musculoskeletal system: Secondary | ICD-10-CM | POA: Insufficient documentation

## 2022-09-25 DIAGNOSIS — M79605 Pain in left leg: Secondary | ICD-10-CM | POA: Diagnosis not present

## 2022-09-25 DIAGNOSIS — R262 Difficulty in walking, not elsewhere classified: Secondary | ICD-10-CM | POA: Insufficient documentation

## 2022-09-25 MED ORDER — GABAPENTIN 300 MG PO CAPS: 300.0000 mg | ORAL_CAPSULE | Freq: Three times a day (TID) | ORAL | 0 refills | Status: DC

## 2022-09-25 MED ORDER — BUTALBITAL-APAP-CAFFEINE 50-325-40 MG PO TABS: ORAL_TABLET | ORAL | 0 refills | Status: DC

## 2022-09-25 NOTE — Patient Instructions (Signed)
Hi Marissa [name], thank you for updating me this morning-have a nice day!  Marissa [name] was given information about Medicaid Managed Care team care coordination services as a part of their Healthy Blue Medicaid benefit. Marissa [name] verbally consented to engagement with the [facility] Managed Care team.   If you are experiencing a medical emergency, please call 911 or report to your local emergency department or urgent care.   If you have a non-emergency medical problem during routine business hours, please contact your provider's office and ask to speak with a nurse.   For questions related to your Healthy [facility] health plan, please call: [phone] or visit the homepage here: [email or web address]  If you would like to schedule transportation through your Healthy [facility] plan, please call the following number at least 2 days in advance of your appointment: [phone]  For information about your ride after you set it up, call Ride Assist at [phone]. Use this number to activate a Will Call pickup, or if your transportation is late for a scheduled pickup. Use this number, too, if you need to make a change or cancel a previously scheduled reservation.  If you need transportation services right away, call [phone]. The after-hours call center is staffed 24 hours to handle ride assistance and urgent reservation requests (including discharges) 365 days a year. Urgent trips include sick visits, hospital discharge requests and life-sustaining treatment.  Call the [facility] Line at [phone], at any time, 24 hours a day, 7 days a week. If you are in danger or need immediate medical attention call 911.  If you would like help to quit smoking, call 1-800-QUIT-NOW ((607)665-0540) OR Espaol: 1-855-Djelo-Ya (9-323-557-3220) o para ms informacin haga clic aqu or Text READY to 254-270 to register via text  Marissa [name] - following  are the goals we discussed in your visit today:   Goals Addressed    Timeframe:  Long-Range Goal Priority:  High Start Date:  06/18/22                           Expected End Date:    ongoing                   Follow Up Date: 10/27/22   - practice safe sex - schedule appointment for vaccines needed due to my age or health - schedule recommended health tests (blood work, mammogram, colonoscopy, pap test) - schedule and keep appointment for annual check-up    Why is this important?   Screening tests can find diseases early when they are easier to treat.  Your doctor or nurse will talk with you about which tests are important for you.  Getting shots for common diseases like the flu and shingles will help prevent them.  09/25/22:  Recently seen by Vassie Loll PCP appt 9/17.  Continues with NEURO   Patient verbalizes understanding of instructions and care plan provided today and agrees to view in MyChart. Active MyChart status and patient understanding of how to access instructions and care plan via MyChart confirmed with patient.     The Managed Medicaid care management team will reach out to the patient again over the next 30 business  days.  The  Patient  has been provided with contact information for the Managed Medicaid care management team and has been advised to call with any health related questions or concerns.   Marissa Der RN, BSN [place] [facility] Care Management Coordinator - Managed Medicaid High Risk [phone]   Following is a copy of your plan of care:  Care Plan : RN Care Manager Plan of Care  Updates made by [name], RN since 09/25/2022 12:00 AM     Problem: Health Promotion or Disease Self-Management (General Plan of Care)      Long-Range Goal: Chronic Disease Management and Care Coordination Needs   Start Date: 06/18/2022  Expected End Date: 11/22/2022  Priority: High  Note:   Current Barriers:  Knowledge Deficits related to  plan of care for management of migraines, rhinitis, back pain, h/o CVA Care Coordination needs related to NEURO appointment Chronic Disease Management support and education needs related to migraines, rhinitis, back pain, h/o CVA 09/25/22:  Temporary relief with injections for pain, new medication ordered, possible surgery? Attending NEURO PT, to begin ST, CBT  RNCM Clinical Goal(s):  Patient will verbalize understanding of plan for management of migraines, rhinitis, back pain, h/o CVA as evidenced by patient report. verbalize basic understanding of migraines, rhinitis, back pain, h/o CVA disease process and self health management plan as evidenced by patient report. take all medications exactly as prescribed and will call provider for medication related questions as evidenced by patient report. demonstrate understanding of rationale for each prescribed medication as evidenced by patient report attend all scheduled medical appointments as evidenced by patient report and EMR review Demonstrate ongoing  adherence to prescribed treatment plan for migraines, rhinitis, back pain, h/o CVA as evidenced by patient report and EMR review continue to work with RN Care Manager to address care management and care coordination needs related to migraines, rhinitis, back pain, h/o CVA as evidenced by adherence to CM Team Scheduled appointments through collaboration with RN Care manager, provider, and care team.   Interventions: Inter-disciplinary care team collaboration (see longitudinal plan of care) Evaluation of current treatment plan related to  self management and patient's adherence to plan as established by provider   (Status:  New goal.)  Long Term Goal Evaluation of current treatment plan related to migraines, rhinitis, back pain, h/o CVA self-management and patient's adherence to plan as established by provider. Discussed plans with patient for ongoing care management follow up and provided patient with  direct contact information for care management team Reviewed medications with patient Collaborated with PCP  regarding appointment Reviewed scheduled/upcoming provider appointments Discussed plans with patient for ongoing care management follow up and provided patient with direct contact information for care management team Assessed social determinant of health barriers 06/18/22:  Message sent to provider to request assistance with NEURO appt.  Patient Goals/Self-Care Activities: Take all medications as prescribed Attend all scheduled provider appointments Call pharmacy for medication refills 3-7 days in advance of running out of medications Perform all self care activities independently  Perform IADL's (shopping, preparing meals, housekeeping, managing finances) independently Call provider office for new concerns or questions  08/22/22:  patient will f/u regarding therapy appts.-completed  Follow Up Plan:  The patient has been provided with contact information for the care management team and has been advised to call with any health related questions or concerns.  The care management team will reach out to the patient again over the next 30 business  days.

## 2022-09-25 NOTE — Therapy (Addendum)
OUTPATIENT PHYSICAL THERAPY LOWER EXTREMITY TREATMENT   Patient Name: Marissa [name] MRN: [id number] DOB:1980/03/12, 42 y.o., female Today's Date: 09/25/2022  END OF SESSION:  PT End of Session - 09/25/22 1322     Visit Number 2    Number of Visits 8    Date for PT Re-Evaluation 11/12/22    Authorization Type [place] Medicaid Health (healthy blue approved 5 visits from 09/17/22-11/15/22)    Authorization Time Period 09/17/22-11/15/22    Authorization - Visit Number 1    Authorization - Number of Visits 5    Progress Note Due on Visit 5    PT Start Time 1300    PT Stop Time 1340    PT Time Calculation (min) 40 min    Equipment Utilized During Treatment Gait belt    Activity Tolerance Patient limited by pain    Behavior During Therapy WFL for tasks assessed/performed             Past Medical History:  Diagnosis Date   Allergy    seasonal   Anxiety    Back pain with right-sided radiculopathy 12/12/2020   Cerebrovascular accident (CVA) due to embolism (HCC) 10/08/2015   R  frontal   Common migraine with intractable migraine 11/08/2015   Encounter for examination following treatment at hospital 03/03/2022   Encounter for gynecological examination with Papanicolaou smear of cervix 12/30/2018   Hyperlipidemia    Stroke (HCC)    Vitamin D deficiency    Past Surgical History:  Procedure Laterality Date   CESAREAN SECTION     PATENT FORAMEN OVALE CLOSURE  09/25/2015   TUBAL LIGATION     Patient Active Problem List   Diagnosis Date Noted   Acute back pain with radiculopathy 07/06/2022   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   H/O Amplatzer atrial septal defect closure 03/25/2022   Depression, major, single episode, moderate (HCC) 03/16/2022   Allergic rhinitis 03/16/2022   Abnormal EKG 03/03/2022   Encounter for examination following treatment at hospital 03/03/2022   Abnormal MRA, brain 10/25/2021   Back pain with right-sided  radiculopathy 12/12/2020   Neurological disorder 09/07/2020   Speech disturbance 09/03/2020   Spell of abnormal behavior    Abnormal MRI of head    TIA (transient ischemic attack) 08/13/2020   History of CVA (cerebrovascular accident) 12/09/2018   Anxiety 12/09/2018   Obesity (BMI 30.0-34.9) 12/09/2018   Common migraine with intractable migraine 11/08/2015   PFO (patent foramen ovale) 10/08/2015   Vitamin D deficiency 10/08/2015    PCP: Kerri Perches MD  REFERRING PROVIDER: Peyton Bottoms, MD  REFERRING DIAG: F44.7 (ICD-10-CM) - Conversion disorder with mixed symptom presentation  THERAPY DIAG:  Muscle weakness (generalized)  Difficulty in walking, not elsewhere classified  Other symptoms and signs involving the musculoskeletal system  Pain in both lower extremities  Rationale for Evaluation and Treatment: Rehabilitation  ONSET DATE: Years ago  SUBJECTIVE:   SUBJECTIVE STATEMENT: Patient does not report of pain on the LE at the moment but claims that the legs feel weak. Patient states that she has 2 episodes of falls the other day but claims that she was not hurt.  EVAL: Arrives to the clinic with weakness and numbness on B legs, tremors on R hand, and difficulty with walking/standing. Patient also reports that the body is stiffening a lot. Condition started years before and had PT. However, patient did not complete PT due to issues with scheduling. MRI of the lumbar spine and  brain was done recently (see results below). Patient was then referred by her neurologist to outpatient PT evaluation and management with a diagnosis of conversion disorder.  PERTINENT HISTORY: Hx of herniated lumbar disc PAIN:  Are you having pain? Yes: NPRS scale: 8/10 Pain location: L LE Pain description: burning and tingling, tooth-ache like Aggravating factors: 2 min of standing/walking Relieving factors: heating pad  PRECAUTIONS: Fall  RED FLAGS: Bowel or bladder  incontinence: No and Spinal tumors: No   WEIGHT BEARING RESTRICTIONS: No  FALLS:  Has patient fallen in last 6 months? Yes. Number of falls 12  LIVING ENVIRONMENT: Lives with:  kids and husband Lives in: House/apartment (2nd floor) Stairs: Yes: External: 12-15 steps; bilateral but cannot reach both Has following equipment at home: Single point cane and standard walker  OCCUPATION: used to work at [employer] at [employer]  PLOF: Independent and Independent with basic ADLs  PATIENT GOALS: "trying to get back like I was"  NEXT MD VISIT: January 2025  OBJECTIVE:   DIAGNOSTIC FINDINGS:  07/21/22 CLINICAL DATA:  Lumbar radiculopathy, symptoms persist with > 6 wks treatment. Low back and left leg pain.   EXAM: MRI LUMBAR SPINE WITHOUT CONTRAST   TECHNIQUE: Multiplanar, multisequence MR imaging of the lumbar spine was performed. No intravenous contrast was administered.   COMPARISON:  Lumbar spine radiographs 12/05/2020   FINDINGS: Segmentation:  Standard.   Alignment:  Normal.   Vertebrae: No fracture or suspicious marrow lesion. Moderate degenerative endplate changes at L5-S1, Modic type 2 greater than 1.   Conus medullaris and cauda equina: Conus extends to the upper L1 level. Conus and cauda equina appear normal.   Paraspinal and other soft tissues: Unremarkable.   Disc levels:   T12-L1 and L1-2: Negative.   L2-3: Minimal disc bulging without stenosis.   L3-4: Mild disc bulging and mild facet hypertrophy without stenosis.   L4-5: Disc desiccation and mild-to-moderate disc space narrowing. Disc bulging, a central disc protrusion, and moderate facet and ligamentum flavum hypertrophy result in mild spinal stenosis, mild bilateral lateral recess stenosis, and mild bilateral neural foraminal stenosis.   L5-S1: Disc desiccation and moderate disc space narrowing. Disc bulging, a large central to left subarticular disc protrusion, and mild facet hypertrophy  result in mild spinal stenosis, mild right and severe left lateral recess stenosis, and mild left neural foraminal stenosis, likely with left S1 nerve root impingement.   IMPRESSION: 1. Large disc protrusion at L5-S1 with left lateral recess stenosis and S1 nerve root impingement. 2. Mild spinal and neural foraminal stenosis at L4-5.  04/30/22 CLINICAL DATA:  Neuro deficit, acute, stroke suspected.   EXAM: MRI HEAD WITHOUT CONTRAST   TECHNIQUE: Multiplanar, multiecho pulse sequences of the brain and surrounding structures were obtained without intravenous contrast.   COMPARISON:  Head CT earlier same day.  MRI 02/21/2022   FINDINGS: Brain: The brain has a normal appearance without evidence of malformation, atrophy, old or acute small or large vessel infarction, mass lesion, hemorrhage, hydrocephalus or extra-axial collection.   Vascular: Major vessels at the base of the brain show flow. Venous sinuses appear patent.   Skull and upper cervical spine: Normal.   Sinuses/Orbits: Clear/normal.   Other: None significant.   IMPRESSION: Normal examination. No abnormality seen to explain the presenting symptoms.  PATIENT SURVEYS:  LEFS 10/80 = 12.5%  COGNITION: Overall cognitive status: Within functional limits for tasks assessed     SENSATION: WFL  COORDINATION:  Heel-to-shin: impaired  Alternating foot taps: [facility]  MUSCLE LENGTH: Hamstrings: Right mild restriction deg; Left mild restriction  POSTURE: rounded shoulders and forward head  PALPATION: Grade 2 tenderness on major muscle mass of B LE  LOWER EXTREMITY ROM:  Active ROM Right eval Left eval  Hip flexion [facility]  Hip extension    Hip abduction [facility] WFL  Hip adduction [facility]  Hip internal rotation    Hip external rotation    Knee flexion 25% limited* 25% limited*  Knee extension 25% limited* 25% limited*  Ankle dorsiflexion 25% limited* 25% limited*  Ankle plantarflexion 25% limited* 25% limited*   Ankle inversion    Ankle eversion     (Blank rows = not tested)  *due to pain and weakness  LOWER EXTREMITY MMT:  MMT Right eval Left eval  Hip flexion 4 4  Hip extension    Hip abduction 4 4  Hip adduction 4 4  Hip internal rotation    Hip external rotation    Knee flexion 2+ 2+  Knee extension 2+ 2+  Ankle dorsiflexion 2+ 2+  Ankle plantarflexion 2+ 2+  Ankle inversion    Ankle eversion     (Blank rows = not tested)   FUNCTIONAL TESTS:  5 times sit to stand: 39 sec with patient holding to thighs and SPC and some assist from PT 2 minute walk test: 25 ft (Patient fainted and passed out after 1 min. Patient did not fall but was slowly sat in a chair. BP = 140/94 mmHg, SpO2 = 98%, HR = 80 bpm  GAIT: Distance walked: 25 ft Assistive device utilized: Single point cane on the R Level of assistance: CGA Comments: decreased step length on B, decreased swing phase on B, slightly wide BOS, mild to moderate unsteadiness seen.   TODAY'S TREATMENT:                                                                                                                              DATE:  09/25/22 NuStep, arm 10, seat 8, level 1 x 5' Seated hamstring stretch x 30 x 3 Seated hip adduction squeeze  x 3" x 10 Seat hip abd isometrics with a belt x 3" x 10 Seated heel slides x 10 on each Seated heel raises x 10 x 2  09/17/22 Evaluation and patient education   PATIENT EDUCATION:  Education details: Written HEP provided and reviewed Person educated: Patient Education method: Explanation, Demonstration, and Verbal cues Education comprehension: returned demonstration  HOME EXERCISE PROGRAM: Access Code: [email or web address] URL: [email or web address] Date: 09/25/2022 Prepared by: Krystal Clark  Exercises - Seated Hamstring Stretch  - 1-2 x daily - 7 x weekly - 3 reps - 30 hold - Seated Hip Adduction Isometrics with Ball  - 1-2 x daily - 7 x weekly - 1 sets - 10 reps - 3 hold -  Seated Isometric Hip Abduction with Belt  - 1-2 x daily - 7 x weekly - 1 sets - 10 reps -  3 hold - Seated Heel Slide  - 1-2 x daily - 7 x weekly - 1 sets - 10 reps - Seated Heel Raise  - 1 x daily - 7 x weekly - 2 sets - 10 reps  ASSESSMENT:  CLINICAL IMPRESSION: Interventions today were geared towards LE strengthening and flexibility. Tolerated all activities with increase in LE pain = 7/10. Demonstrated mild to moderate level of fatigue. Rest periods given. Provided mod amount of multimodal cueing to ensure correct execution of activity with poor to fair carry-over due to weakness, pain, and incoordination. Demonstrated sudden twitches, sustained contraction (ankles inversion) and jerks on LE during heel slides and heel raises. Patient states that she cannot control those motions. Patient also exhibits a noticeable sigh together with the jerks, twitches, and sustained contraction saying that she's having an "attack" of her condition. To date, skilled PT is required to address the impairments and improve function.  EVAL: Patient is a 42 y.o. female who was seen today for physical therapy evaluation and treatment for conversion disorder. Patient was diagnosed with conversion disorder by referring provider further defined by difficulty with walking and stair negotiation due to pain, weakness, and decreased soft tissue extensibility. Skilled PT is required to address the impairments and functional limitations listed below.  Patient tolerated today's session [facility] for the tasks performed except when doing the 5x STS where patient demonstrated progressive weakness and required several attempts to perform a successful trial with increasing repetition. Patient reported at the end of the task the she's having an "attack". Patient was informed about not continuing with the due to progressive weakness but patient insisted to continue. After a minute of , patient fainted. Patient did not fall but was slowly  sat in a chair. BP = 140/94 mmHg, SpO2 = 98%, HR = 80 bpm. To aid with recovery, patient was instructed to do slow deep breaths. After 10-15 minutes, patient fully recovered and was back to her consciousness and was able to walk out of the clinic with her [facility].  OBJECTIVE IMPAIRMENTS: Abnormal gait, decreased activity tolerance, decreased balance, decreased coordination, difficulty walking, decreased strength, impaired flexibility, and pain.   ACTIVITY LIMITATIONS: carrying, lifting, bending, squatting, stairs, transfers, bathing, toileting, dressing, self feeding, hygiene/grooming, locomotion level, and caring for others  PARTICIPATION LIMITATIONS: meal prep, cleaning, laundry, driving, shopping, occupation, and yard work  PERSONAL FACTORS: Behavior pattern, Time since onset of injury/illness/exacerbation, and 1-2 comorbidities: back pain and anxiety  are also affecting patient's functional outcome.   REHAB POTENTIAL: Fair    CLINICAL DECISION MAKING: Unstable/unpredictable  EVALUATION COMPLEXITY: High   GOALS: Goals reviewed with patient? Yes  SHORT TERM GOALS: Target date: 10/15/22 Pt will demonstrate indep in HEP to facilitate carry-over of skilled services and improve functional outcomes  Goal status: INITIAL  LONG TERM GOALS: Target date: 11/12/22.  Pt will decrease 5TSTS by at least 3 seconds in order to demonstrate clinically significant improvement in LE strength and balance  Baseline: 39 sec Goal status: INITIAL  2.  Pt will increase by at least 40 ft in order to demonstrate clinically significant improvement in community ambulation  Baseline: 25 ft Goal status: INITIAL  3.  Pt will demonstrate increase in LE strength to 4-/5 to facilitate ease and safety in ambulation  Baseline: 2+ Goal status: INITIAL  4.  Pt will demonstrate improved flexibility in the LEs to slight restriction facilitate ease in ambulation and ADLs  Baseline: mild restriction Goal status:  INITIAL  5.  Patient will demonstrate improved coordination as manifested by performing heel-to-shin accurately on B in 2/4 trials. Baseline: impaired Goal status: INITIAL   PLAN:  PT FREQUENCY: 1x/week  PT DURATION: 8 weeks  PLANNED INTERVENTIONS: Therapeutic exercises, Therapeutic activity, Neuromuscular re-education, Balance training, Gait training, Patient/Family education, Self Care, and Stair training  PLAN FOR NEXT SESSION: Continue POC and may progress as tolerated with emphasis on LE strengthening, flexibility, balance and gait activities.   Tish Frederickson. Prestina Raigoza, PT, DPT, OCS Board-Certified Clinical Specialist in Orthopedic PT PT Compact Privilege # ([place]): [phone] T 09/25/2022, 1:24 PM

## 2022-09-25 NOTE — Patient Outreach (Signed)
Medicaid Managed Care   Nurse Care Manager Note  09/25/2022 Name:  Marissa [name] MRN:  [id number] DOB:  1980/04/22  Marissa [name] is an 42 y.o. year old female who is a primary patient of Marissa Perches, MD.  The [facility] Managed Care Coordination team was consulted for assistance with:    Chronic healthcare management needs, migraines, rhinitis, chronic pain, h/o CVA, functional movement/neurological disorder, right arm tremor, herniated disc, non-epileptic seizures  Ms. Marissa [name] was given information about Medicaid Managed Care Coordination team services today. Marissa [name] Patient agreed to services and verbal consent obtained.  Engaged with patient by telephone for follow up visit in response to provider referral for case management and/or care coordination services.   Assessments/Interventions:  Review of past medical history, allergies, medications, health status, including review of consultants reports, laboratory and other test data, was performed as part of comprehensive evaluation and provision of chronic care management services.  SDOH (Social Determinants of Health) assessments and interventions performed: SDOH Interventions    Flowsheet Row Patient Outreach Telephone from 09/25/2022 in [place] POPULATION HEALTH DEPARTMENT Patient Outreach Telephone from 08/22/2022 in [place] POPULATION HEALTH DEPARTMENT Patient Outreach Telephone from 07/21/2022 in [place] POPULATION HEALTH DEPARTMENT Patient Outreach Telephone from 06/18/2022 in [place] POPULATION HEALTH DEPARTMENT Office Visit from 03/14/2022 in [name]  SDOH Interventions       Food Insecurity Interventions -- -- -- Intervention Not Indicated --  Housing Interventions -- -- -- Intervention Not Indicated --  Transportation Interventions -- -- -- Intervention Not Indicated --  Utilities Interventions -- -- -- Intervention Not Indicated --  Alcohol Usage Interventions -- --  Intervention Not Indicated (Score <7) -- --  Depression Interventions/Treatment  -- -- -- -- Counseling, [id number] Referral  Financial Strain Interventions -- Intervention Not Indicated -- -- --  Physical Activity Interventions Intervention Not Indicated, Other (Comments)  [patient not physically able to engage in this type of exercise] -- -- -- --  Health Literacy Interventions -- Intervention Not Indicated -- -- --     Care Plan Allergies  Allergen Reactions   Triptans     Cannot use triptans for migraine per neurology due to stroke history   Morphine And Codeine Hives    Feels like skin is burning     Medications Reviewed Today     Reviewed by Marissa Chandler, RN (Registered Nurse) on 09/25/22 at [phone]  Med List Status: <None>   Medication Order Taking? Sig Documenting Provider Last Dose Status Informant  ALPRAZolam (XANAX) 0.5 MG tablet [id number] No Take 1 tablet (0.5 mg total) by mouth at bedtime as needed for anxiety. Take for mri  Patient not taking: Reported on 07/21/2022   Marissa Hearing, MD Not Taking Active   butalbital-acetaminophen-caffeine [facility]) [phone] MG tablet [id number] No Take one tablet by mouth twice daily, as needed, for headache. Maximum is twice per week Marissa Perches, MD Taking Active   ergocalciferol (VITAMIN D2) 1.25 MG (50000 UT) capsule [id number] No Take 1 capsule (50,000 Units total) by mouth once a week. One capsule once weekly Marissa Perches, MD Taking Active   predniSONE (DELTASONE) 10 MG tablet [id number] No Take 1 tablet (10 mg total) by mouth 2 (two) times daily with a meal.  Patient not taking: Reported on 07/14/2022   Marissa Perches, MD Not Taking Active   pregabalin (LYRICA) 75 MG capsule [id number] No Take 1 capsule (75 mg total) by mouth 2 (two)  times daily. Marissa Sheer, MD Taking Expired 08/30/22 2359            Patient Active Problem List   Diagnosis Date Noted   Acute back pain with radiculopathy 07/06/2022    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   H/O Amplatzer atrial septal defect closure 03/25/2022   Depression, major, single episode, moderate (HCC) 03/16/2022   Allergic rhinitis 03/16/2022   Abnormal EKG 03/03/2022   Encounter for examination following treatment at hospital 03/03/2022   Abnormal MRA, brain 10/25/2021   Back pain with right-sided radiculopathy 12/12/2020   Neurological disorder 09/07/2020   Speech disturbance 09/03/2020   Spell of abnormal behavior    Abnormal MRI of head    TIA (transient ischemic attack) 08/13/2020   History of CVA (cerebrovascular accident) 12/09/2018   Anxiety 12/09/2018   Obesity (BMI 30.0-34.9) 12/09/2018   Common migraine with intractable migraine 11/08/2015   PFO (patent foramen ovale) 10/08/2015   Vitamin D deficiency 10/08/2015   Conditions to be addressed/monitored per PCP order:  Chronic healthcare management needs, migraines, rhinitis, chronic pain, h/o CVA, functional movement/neurological disorder, right arm tremor, herniated disc, non-epileptic seizures  Care Plan : RN Care Manager Plan of Care  Updates made by Marissa Chandler, RN since 09/25/2022 12:00 AM     Problem: Health Promotion or Disease Self-Management (General Plan of Care)      Long-Range Goal: Chronic Disease Management and Care Coordination Needs   Start Date: 06/18/2022  Expected End Date: 11/22/2022  Priority: High  Note:   Current Barriers:  Knowledge Deficits related to plan of care for management of migraines, rhinitis, back pain, h/o CVA Care Coordination needs related to NEURO appointment Chronic Disease Management support and education needs related to migraines, rhinitis, back pain, h/o CVA 09/25/22:  Temporary relief with injections for pain, new medication ordered, possible surgery? Attending NEURO PT, to begin ST, CBT  RNCM Clinical Goal(s):  Patient will verbalize understanding of plan for management of migraines, rhinitis,  back pain, h/o CVA as evidenced by patient report. verbalize basic understanding of migraines, rhinitis, back pain, h/o CVA disease process and self health management plan as evidenced by patient report. take all medications exactly as prescribed and will call provider for medication related questions as evidenced by patient report. demonstrate understanding of rationale for each prescribed medication as evidenced by patient report attend all scheduled medical appointments as evidenced by patient report and EMR review Demonstrate ongoing  adherence to prescribed treatment plan for migraines, rhinitis, back pain, h/o CVA as evidenced by patient report and EMR review continue to work with RN Care Manager to address care management and care coordination needs related to migraines, rhinitis, back pain, h/o CVA as evidenced by adherence to CM Team Scheduled appointments through collaboration with RN Care manager, provider, and care team.   Interventions: Inter-disciplinary care team collaboration (see longitudinal plan of care) Evaluation of current treatment plan related to  self management and patient's adherence to plan as established by provider   (Status:  New goal.)  Long Term Goal Evaluation of current treatment plan related to migraines, rhinitis, back pain, h/o CVA self-management and patient's adherence to plan as established by provider. Discussed plans with patient for ongoing care management follow up and provided patient with direct contact information for care management team Reviewed medications with patient Collaborated with PCP  regarding appointment Reviewed scheduled/upcoming provider appointments Discussed plans with patient for ongoing care management follow up and provided patient with  direct contact information for care management team Assessed social determinant of health barriers 06/18/22:  Message sent to provider to request assistance with NEURO appt.  Patient  Goals/Self-Care Activities: Take all medications as prescribed Attend all scheduled provider appointments Call pharmacy for medication refills 3-7 days in advance of running out of medications Perform all self care activities independently  Perform IADL's (shopping, preparing meals, housekeeping, managing finances) independently Call provider office for new concerns or questions  08/22/22:  patient will f/u regarding therapy appts.-completed  Follow Up Plan:  The patient has been provided with contact information for the care management team and has been advised to call with any health related questions or concerns.  The care management team will reach out to the patient again over the next 30 business  days.   Long-Range Goal: Establish Plan of care for Chronic Disease Management Needs and Care Coordination Needs   Priority: High  Note:   Timeframe:  Long-Range Goal Priority:  High Start Date:  06/18/22                           Expected End Date:    ongoing                   Follow Up Date: 10/27/22   - practice safe sex - schedule appointment for vaccines needed due to my age or health - schedule recommended health tests (blood work, mammogram, colonoscopy, pap test) - schedule and keep appointment for annual check-up    Why is this important?   Screening tests can find diseases early when they are easier to treat.  Your doctor or nurse will talk with you about which tests are important for you.  Getting shots for common diseases like the flu and shingles will help prevent them.  09/25/22:  Recently seen by Vassie Loll PCP appt 9/17.  Continues with NEURO PT.   Follow Up:  Patient agrees to Care Plan and Follow-up.  Plan: The Managed Medicaid care management team will reach out to the patient again over the next 30 business  days. and The  Patient has been provided with contact information for the Managed Medicaid care management team and has been advised to call with any  health related questions or concerns.  Date/time of next scheduled RN care management/care coordination outreach: 10/27/22 at 1030.

## 2022-10-01 ENCOUNTER — Ambulatory Visit (HOSPITAL_COMMUNITY): Payer: Medicaid Other

## 2022-10-01 DIAGNOSIS — R262 Difficulty in walking, not elsewhere classified: Secondary | ICD-10-CM

## 2022-10-01 DIAGNOSIS — M79605 Pain in left leg: Secondary | ICD-10-CM | POA: Diagnosis not present

## 2022-10-01 DIAGNOSIS — M79604 Pain in right leg: Secondary | ICD-10-CM

## 2022-10-01 DIAGNOSIS — M6281 Muscle weakness (generalized): Secondary | ICD-10-CM | POA: Diagnosis not present

## 2022-10-01 DIAGNOSIS — R29898 Other symptoms and signs involving the musculoskeletal system: Secondary | ICD-10-CM | POA: Diagnosis not present

## 2022-10-01 NOTE — Therapy (Signed)
OUTPATIENT PHYSICAL THERAPY LOWER EXTREMITY TREATMENT   Patient Name: Marissa [name] MRN: [id number] DOB:1980/10/31, 42 y.o., female Today's Date: 10/01/2022  END OF SESSION:  PT End of Session - 10/01/22 1132     Visit Number 3    Number of Visits 8    Date for PT Re-Evaluation 11/12/22    Authorization Type [place] [employer] (healthy blue approved 5 visits from 09/17/22-11/15/22)    Authorization Time Period 09/17/22-11/15/22    Authorization - Visit Number 2    Authorization - Number of Visits 5    Progress Note Due on Visit 5    PT Start Time 1120    PT Stop Time 1200    PT Time Calculation (min) 40 min    Equipment Utilized During Treatment Gait belt    Activity Tolerance Patient limited by pain    Behavior During Therapy WFL for tasks assessed/performed             Past Medical History:  Diagnosis Date   Allergy    seasonal   Anxiety    Back pain with right-sided radiculopathy 12/12/2020   Cerebrovascular accident (CVA) due to embolism (HCC) 10/08/2015   R  frontal   Common migraine with intractable migraine 11/08/2015   Encounter for examination following treatment at hospital 03/03/2022   Encounter for gynecological examination with Papanicolaou smear of cervix 12/30/2018   Hyperlipidemia    Stroke (HCC)    Vitamin D deficiency    Past Surgical History:  Procedure Laterality Date   CESAREAN SECTION     PATENT FORAMEN OVALE CLOSURE  09/25/2015   TUBAL LIGATION     Patient Active Problem List   Diagnosis Date Noted   Acute back pain with radiculopathy 07/06/2022   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   H/O Amplatzer atrial septal defect closure 03/25/2022   Depression, major, single episode, moderate (HCC) 03/16/2022   Allergic rhinitis 03/16/2022   Abnormal EKG 03/03/2022   Encounter for examination following treatment at hospital 03/03/2022   Abnormal MRA, brain 10/25/2021   Back pain with right-sided  radiculopathy 12/12/2020   Neurological disorder 09/07/2020   Speech disturbance 09/03/2020   Spell of abnormal behavior    Abnormal MRI of head    TIA (transient ischemic attack) 08/13/2020   History of CVA (cerebrovascular accident) 12/09/2018   Anxiety 12/09/2018   Obesity (BMI 30.0-34.9) 12/09/2018   Common migraine with intractable migraine 11/08/2015   PFO (patent foramen ovale) 10/08/2015   Vitamin D deficiency 10/08/2015    PCP: Kerri Perches MD  REFERRING PROVIDER: Peyton Bottoms, MD  REFERRING DIAG: F44.7 (ICD-10-CM) - Conversion disorder with mixed symptom presentation  THERAPY DIAG:  Muscle weakness (generalized)  Difficulty in walking, not elsewhere classified  Other symptoms and signs involving the musculoskeletal system  Pain in both lower extremities  Rationale for Evaluation and Treatment: Rehabilitation  ONSET DATE: Years ago  SUBJECTIVE:   SUBJECTIVE STATEMENT: Currently reports of L LE pain = 4/10. Patient does not report of any changes with function. Patient reports some difficulty with the hamstring stretches at home. States that she performed her HEP at one point and legs felt extremely weak after. Patient fell last Monday  EVAL: Arrives to the clinic with weakness and numbness on B legs, tremors on R hand, and difficulty with walking/standing. Patient also reports that the body is stiffening a lot. Condition started years before and had PT. However, patient did not complete PT due to issues  with scheduling. MRI of the lumbar spine and brain was done recently (see results below). Patient was then referred by her neurologist to outpatient PT evaluation and management with a diagnosis of conversion disorder.  PERTINENT HISTORY: Hx of herniated lumbar disc PAIN:  Are you having pain? Yes: NPRS scale: 8/10 Pain location: L LE Pain description: burning and tingling, tooth-ache like Aggravating factors: 2 min of standing/walking Relieving  factors: heating pad  PRECAUTIONS: Fall  RED FLAGS: Bowel or bladder incontinence: No and Spinal tumors: No   WEIGHT BEARING RESTRICTIONS: No  FALLS:  Has patient fallen in last 6 months? Yes. Number of falls 12  LIVING ENVIRONMENT: Lives with:  kids and husband Lives in: House/apartment (2nd floor) Stairs: Yes: External: 12-15 steps; bilateral but cannot reach both Has following equipment at home: Single point cane and standard walker  OCCUPATION: used to work at [employer] at [employer]  PLOF: Independent and Independent with basic ADLs  PATIENT GOALS: "trying to get back like I was"  NEXT MD VISIT: January 2025  OBJECTIVE:   DIAGNOSTIC FINDINGS:  07/21/22 CLINICAL DATA:  Lumbar radiculopathy, symptoms persist with > 6 wks treatment. Low back and left leg pain.   EXAM: MRI LUMBAR SPINE WITHOUT CONTRAST   TECHNIQUE: Multiplanar, multisequence MR imaging of the lumbar spine was performed. No intravenous contrast was administered.   COMPARISON:  Lumbar spine radiographs 12/05/2020   FINDINGS: Segmentation:  Standard.   Alignment:  Normal.   Vertebrae: No fracture or suspicious marrow lesion. Moderate degenerative endplate changes at L5-S1, Modic type 2 greater than 1.   Conus medullaris and cauda equina: Conus extends to the upper L1 level. Conus and cauda equina appear normal.   Paraspinal and other soft tissues: Unremarkable.   Disc levels:   T12-L1 and L1-2: Negative.   L2-3: Minimal disc bulging without stenosis.   L3-4: Mild disc bulging and mild facet hypertrophy without stenosis.   L4-5: Disc desiccation and mild-to-moderate disc space narrowing. Disc bulging, a central disc protrusion, and moderate facet and ligamentum flavum hypertrophy result in mild spinal stenosis, mild bilateral lateral recess stenosis, and mild bilateral neural foraminal stenosis.   L5-S1: Disc desiccation and moderate disc space narrowing. Disc bulging, a large  central to left subarticular disc protrusion, and mild facet hypertrophy result in mild spinal stenosis, mild right and severe left lateral recess stenosis, and mild left neural foraminal stenosis, likely with left S1 nerve root impingement.   IMPRESSION: 1. Large disc protrusion at L5-S1 with left lateral recess stenosis and S1 nerve root impingement. 2. Mild spinal and neural foraminal stenosis at L4-5.  04/30/22 CLINICAL DATA:  Neuro deficit, acute, stroke suspected.   EXAM: MRI HEAD WITHOUT CONTRAST   TECHNIQUE: Multiplanar, multiecho pulse sequences of the brain and surrounding structures were obtained without intravenous contrast.   COMPARISON:  Head CT earlier same day.  MRI 02/21/2022   FINDINGS: Brain: The brain has a normal appearance without evidence of malformation, atrophy, old or acute small or large vessel infarction, mass lesion, hemorrhage, hydrocephalus or extra-axial collection.   Vascular: Major vessels at the base of the brain show flow. Venous sinuses appear patent.   Skull and upper cervical spine: Normal.   Sinuses/Orbits: Clear/normal.   Other: None significant.   IMPRESSION: Normal examination. No abnormality seen to explain the presenting symptoms.  PATIENT SURVEYS:  LEFS 10/80 = 12.5%  COGNITION: Overall cognitive status: Within functional limits for tasks assessed     SENSATION: WFL  COORDINATION:  Heel-to-shin: impaired  Alternating foot taps: WFL  MUSCLE LENGTH: Hamstrings: Right mild restriction deg; Left mild restriction  POSTURE: rounded shoulders and forward head  PALPATION: Grade 2 tenderness on major muscle mass of B LE  LOWER EXTREMITY ROM:  Active ROM Right eval Left eval  Hip flexion [facility]  Hip extension    Hip abduction [facility] WFL  Hip adduction [facility]  Hip internal rotation    Hip external rotation    Knee flexion 25% limited* 25% limited*  Knee extension 25% limited* 25% limited*  Ankle dorsiflexion  25% limited* 25% limited*  Ankle plantarflexion 25% limited* 25% limited*  Ankle inversion    Ankle eversion     (Blank rows = not tested)  *due to pain and weakness  LOWER EXTREMITY MMT:  MMT Right eval Left eval  Hip flexion 4 4  Hip extension    Hip abduction 4 4  Hip adduction 4 4  Hip internal rotation    Hip external rotation    Knee flexion 2+ 2+  Knee extension 2+ 2+  Ankle dorsiflexion 2+ 2+  Ankle plantarflexion 2+ 2+  Ankle inversion    Ankle eversion     (Blank rows = not tested)   FUNCTIONAL TESTS:  5 times sit to stand: 39 sec with patient holding to thighs and SPC and some assist from PT 2 minute walk test: 25 ft (Patient fainted and passed out after 1 min. Patient did not fall but was slowly sat in a chair. BP = 140/94 mmHg, SpO2 = 98%, HR = 80 bpm  GAIT: Distance walked: 25 ft Assistive device utilized: Single point cane on the R Level of assistance: CGA Comments: decreased step length on B, decreased swing phase on B, slightly wide BOS, mild to moderate unsteadiness seen.   TODAY'S TREATMENT:                                                                                                                              DATE:  10/01/22 (in order) Recumbent bike, seat 9, x 5' Seated hip adduction squeeze  x 3" x 10 Seat hip abd isometrics with a belt x 3" x 10 Seated heel raises x 10 x 2   09/25/22 NuStep, arm 10, seat 8, level 1 x 5' Seated hamstring stretch x 30 x 3 Seated hip adduction squeeze  x 3" x 10 Seat hip abd isometrics with a belt x 3" x 10 Seated heel slides x 10 on each Seated heel raises x 10 x 2  09/17/22 Evaluation and patient education   PATIENT EDUCATION:  Education details: Written HEP provided and reviewed Person educated: Patient Education method: Explanation, Demonstration, and Verbal cues Education comprehension: returned demonstration  HOME EXERCISE PROGRAM: Access Code: [email or web address] URL:  [email or web address] Date: 09/25/2022 Prepared by: Krystal Clark  Exercises - Seated Hamstring Stretch  - 1-2 x daily - 7 x weekly - 3 reps - 30 hold - Seated  Hip Adduction Isometrics with Ball  - 1-2 x daily - 7 x weekly - 1 sets - 10 reps - 3 hold - Seated Isometric Hip Abduction with Belt  - 1-2 x daily - 7 x weekly - 1 sets - 10 reps - 3 hold - Seated Heel Slide  - 1-2 x daily - 7 x weekly - 1 sets - 10 reps - Seated Heel Raise  - 1 x daily - 7 x weekly - 2 sets - 10 reps  ASSESSMENT:  CLINICAL IMPRESSION: Interventions today were geared towards LE strengthening and flexibility. Tolerated all activities with increase in LE pain = 9/10 after performing the hip abd isometrics to which patient had an "attack". Demonstrated moderate level of fatigue. Frequent and prolonged rest periods given. Provided mod amount of multimodal cueing to ensure correct execution of activity with poor to fair carry-over due to weakness, pain, and incoordination. Still demonstrated sudden twitches and jerks on LE during heel slides and heel raises. At the end of the session, patient had a full blown episode where she passed out. BP = 104/44 with SpO2 = 97%. Patient also c/o frontal HA. After a few minutes of rest and deep breathing, patient regained her complete consciousness. BP was then 128/92 with SpO2 = 98%. Patient was wheeled out of the clinic and assisted to her car.  To date, skilled PT is required to address the impairments and improve function. If patient still continues to have an episode for the next 2 visits and if patient cannot tolerate the exercises, patient may need to be d/c from PT.  EVAL: Patient is a 42 y.o. female who was seen today for physical therapy evaluation and treatment for conversion disorder. Patient was diagnosed with conversion disorder by referring provider further defined by difficulty with walking and stair negotiation due to pain, weakness, and decreased soft tissue  extensibility. Skilled PT is required to address the impairments and functional limitations listed below.  Patient tolerated today's session [facility] for the tasks performed except when doing the 5x STS where patient demonstrated progressive weakness and required several attempts to perform a successful trial with increasing repetition. Patient reported at the end of the task the she's having an "attack". Patient was informed about not continuing with the due to progressive weakness but patient insisted to continue. After a minute of , patient fainted. Patient did not fall but was slowly sat in a chair. BP = 140/94 mmHg, SpO2 = 98%, HR = 80 bpm. To aid with recovery, patient was instructed to do slow deep breaths. After 10-15 minutes, patient fully recovered and was back to her consciousness and was able to walk out of the clinic with her [facility].  OBJECTIVE IMPAIRMENTS: Abnormal gait, decreased activity tolerance, decreased balance, decreased coordination, difficulty walking, decreased strength, impaired flexibility, and pain.   ACTIVITY LIMITATIONS: carrying, lifting, bending, squatting, stairs, transfers, bathing, toileting, dressing, self feeding, hygiene/grooming, locomotion level, and caring for others  PARTICIPATION LIMITATIONS: meal prep, cleaning, laundry, driving, shopping, occupation, and yard work  PERSONAL FACTORS: Behavior pattern, Time since onset of injury/illness/exacerbation, and 1-2 comorbidities: back pain and anxiety  are also affecting patient's functional outcome.   REHAB POTENTIAL: Fair    CLINICAL DECISION MAKING: Unstable/unpredictable  EVALUATION COMPLEXITY: High   GOALS: Goals reviewed with patient? Yes  SHORT TERM GOALS: Target date: 10/15/22 Pt will demonstrate indep in HEP to facilitate carry-over of skilled services and improve functional outcomes  Goal status: INITIAL  LONG TERM GOALS: Target  date: 11/12/22.  Pt will decrease 5TSTS by at least 3 seconds in  order to demonstrate clinically significant improvement in LE strength and balance  Baseline: 39 sec Goal status: INITIAL  2.  Pt will increase by at least 40 ft in order to demonstrate clinically significant improvement in community ambulation  Baseline: 25 ft Goal status: INITIAL  3.  Pt will demonstrate increase in LE strength to 4-/5 to facilitate ease and safety in ambulation  Baseline: 2+ Goal status: INITIAL  4.  Pt will demonstrate improved flexibility in the LEs to slight restriction facilitate ease in ambulation and ADLs  Baseline: mild restriction Goal status: INITIAL  5.  Patient will demonstrate improved coordination as manifested by performing heel-to-shin accurately on B in 2/4 trials. Baseline: impaired Goal status: INITIAL   PLAN:  PT FREQUENCY: 1x/week  PT DURATION: 8 weeks  PLANNED INTERVENTIONS: Therapeutic exercises, Therapeutic activity, Neuromuscular re-education, Balance training, Gait training, Patient/Family education, Self Care, and Stair training  PLAN FOR NEXT SESSION: Continue POC and may progress as tolerated with emphasis on LE strengthening, flexibility, balance and gait activities.   Tish Frederickson. Jillane Po, PT, DPT, OCS Board-Certified Clinical Specialist in Orthopedic PT PT Compact Privilege # ([place]): [id number] T 10/01/2022, 11:33 AM

## 2022-10-07 ENCOUNTER — Encounter: Payer: Self-pay | Admitting: Family Medicine

## 2022-10-07 ENCOUNTER — Encounter: Payer: Self-pay | Admitting: Orthopedic Surgery

## 2022-10-07 ENCOUNTER — Ambulatory Visit (INDEPENDENT_AMBULATORY_CARE_PROVIDER_SITE_OTHER): Payer: Medicaid Other | Admitting: Family Medicine

## 2022-10-07 VITALS — BP 129/85 | HR 85 | Ht 62.0 in | Wt 183.0 lb

## 2022-10-07 DIAGNOSIS — G43019 Migraine without aura, intractable, without status migrainosus: Secondary | ICD-10-CM | POA: Diagnosis not present

## 2022-10-07 DIAGNOSIS — F447 Conversion disorder with mixed symptom presentation: Secondary | ICD-10-CM

## 2022-10-07 DIAGNOSIS — R479 Unspecified speech disturbances: Secondary | ICD-10-CM

## 2022-10-07 DIAGNOSIS — M5410 Radiculopathy, site unspecified: Secondary | ICD-10-CM

## 2022-10-07 NOTE — Assessment & Plan Note (Addendum)
Persistent symptoms of recurrent falls, difficulty wih speech, jerking episodes, no dx of seizure d/o , continue with planned therapy and neurology follow up and management at [facility] Unable to drive or live independantly

## 2022-10-07 NOTE — Patient Instructions (Signed)
F/U ind February, ca mll if you need me sooner  Continue to pursue all appointments and keep hope   Careful not to fall  Thanks for choosing [facility], we consider it a privelige to serve you.

## 2022-10-07 NOTE — Assessment & Plan Note (Signed)
States that surgery has been presented as an option for pain relief, holding off on this now

## 2022-10-07 NOTE — Assessment & Plan Note (Signed)
Improved speech noted at visit, however at times still has expressive aphasia

## 2022-10-07 NOTE — Progress Notes (Signed)
Marissa [name]     MRN: [id number]      DOB: 05-11-1980  Chief Complaint  Patient presents with   Follow-up    Follow up, discuss headaches, passing out bp drops, legs give out currently in PT    HPI Marissa [name] is here for follow up Still having passing out episodes of legs giving out, fell 4 days ago, and alos legs gave out last week and was unable to speak when bending putting her head in a freezer, spouse had to carry her toa seat,   having headacheas, triggers seem to be noise and light In pT reports supposed to be for upper extremities but states it is being don on lower extremities, she gets weak and " has a spell " every time she goes Is lined up for speech therapy which is improving Larey Seat last Saturday , legs give out  has not qualified for CSP Dx with functional neurologic d/ o feels "better" now that she has a formal dx and is working hard at getting to her best level of functioning while accepting her illness ROS Denies recent fever or chills. Denies sinus pressure, nasal congestion, ear pain or sore throat. Denies chest congestion, productive cough or wheezing. Denies chest pains, palpitations and leg swelling Denies abdominal pain, nausea, vomiting,diarrhea or constipation.   Denies dysuria, frequency, hesitancy or incontinence. Denies skin break down or rash.   PE  BP 129/85 (BP Location: Right Arm, Patient Position: Sitting, Cuff Size: Large)   Pulse 85   Ht 5\' 2"  (1.575 m)   Wt 183 lb (83 kg)   SpO2 98%   BMI 33.47 kg/m   Patient alert and oriented and in no cardiopulmonary distress.  HEENT: No facial asymmetry, EOMI,     Neck supple .  Chest: Clear to auscultation bilaterally.  CVS: S1, S2 no murmurs, no S3.Regular rate.  ABD: Soft non tender.   Ext: No edema    Psych: Good eye contact, normal affect. Memory intact not anxious or depressed appearing.  CNS: CN 2-12 intact, power,  normal throughout.no focal deficits noted.   Assessment &  Plan  Functional neurological symptom disorder with mixed symptoms Persistent symptoms of recurrent falls, difficulty wih speech, jerking episodes, no dx of seizure d/o , continue with planned therapy and neurology follow up and management at [facility] Unable to drive or live independantly  Speech disturbance Improved speech noted at visit, however at times still has expressive aphasia  Back pain with right-sided radiculopathy States that surgery has been presented as an option for pain relief, holding off on this now  Common migraine with intractable migraine Reports that firoricet affords relief , but needs more often than prescribed, no change in dosing, may need Neurology management

## 2022-10-07 NOTE — Assessment & Plan Note (Signed)
Reports that firoricet affords relief , but needs more often than prescribed, no change in dosing, may need Neurology management

## 2022-10-08 ENCOUNTER — Ambulatory Visit (HOSPITAL_COMMUNITY): Payer: Medicaid Other

## 2022-10-08 DIAGNOSIS — M79604 Pain in right leg: Secondary | ICD-10-CM | POA: Diagnosis not present

## 2022-10-08 DIAGNOSIS — M6281 Muscle weakness (generalized): Secondary | ICD-10-CM | POA: Diagnosis not present

## 2022-10-08 DIAGNOSIS — R29898 Other symptoms and signs involving the musculoskeletal system: Secondary | ICD-10-CM | POA: Diagnosis not present

## 2022-10-08 DIAGNOSIS — R262 Difficulty in walking, not elsewhere classified: Secondary | ICD-10-CM

## 2022-10-08 DIAGNOSIS — M79605 Pain in left leg: Secondary | ICD-10-CM | POA: Diagnosis not present

## 2022-10-08 NOTE — Therapy (Addendum)
OUTPATIENT PHYSICAL THERAPY LOWER EXTREMITY TREATMENT   Patient Name: Marissa [name] MRN: [id number] DOB:[birth year], 42 y.o., female Today's Date: 10/08/2022  END OF SESSION:  PT End of Session - 10/08/22 1048     Visit Number 4    Number of Visits 8    Date for PT Re-Evaluation 11/12/22    Authorization Type [employer] (healthy blue approved 5 visits from 09/17/22-11/15/22)    Authorization Time Period 09/17/22-11/15/22    Authorization - Visit Number 3    Authorization - Number of Visits 5    Progress Note Due on Visit 4    PT Start Time 1020    PT Stop Time 1115    PT Time Calculation (min) 55 min    Activity Tolerance Patient limited by pain;Treatment limited secondary to medical complications (Comment);Other (comment)   conversion d/o symptoms   Behavior During Therapy WFL for tasks assessed/performed            Past Medical History:  Diagnosis Date   Allergy    seasonal   Anxiety    Back pain with right-sided radiculopathy 12/12/2020   Cerebrovascular accident (CVA) due to embolism (HCC) 10/08/2015   R  frontal   Common migraine with intractable migraine 11/08/2015   Encounter for examination following treatment at hospital 03/03/2022   Encounter for gynecological examination with Papanicolaou smear of cervix 12/30/2018   Hyperlipidemia    Stroke (HCC)    Vitamin D deficiency    Past Surgical History:  Procedure Laterality Date   CESAREAN SECTION     PATENT FORAMEN OVALE CLOSURE  09/25/2015   TUBAL LIGATION     Patient Active Problem List   Diagnosis Date Noted   Acute back pain with radiculopathy 07/06/2022   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   H/O Amplatzer atrial septal defect closure 03/25/2022   Depression, major, single episode, moderate (HCC) 03/16/2022   Allergic rhinitis 03/16/2022   Abnormal EKG 03/03/2022   Encounter for examination following treatment at hospital 03/03/2022   Abnormal MRA, brain  10/25/2021   Back pain with right-sided radiculopathy 12/12/2020   Neurological disorder 09/07/2020   Speech disturbance 09/03/2020   Spell of abnormal behavior    Abnormal MRI of head    TIA (transient ischemic attack) 08/13/2020   History of CVA (cerebrovascular accident) 12/09/2018   Anxiety 12/09/2018   Obesity (BMI 30.0-34.9) 12/09/2018   Common migraine with intractable migraine 11/08/2015   PFO (patent foramen ovale) 10/08/2015   Vitamin D deficiency 10/08/2015    PCP: Kerri Perches MD  REFERRING PROVIDER: Peyton Bottoms, MD  REFERRING DIAG: F44.7 (ICD-10-CM) - Conversion disorder with mixed symptom presentation  THERAPY DIAG:  Muscle weakness (generalized)  Difficulty in walking, not elsewhere classified  Other symptoms and signs involving the musculoskeletal system  Pain in both lower extremities  Rationale for Evaluation and Treatment: Rehabilitation  ONSET DATE: Years ago  SUBJECTIVE:   SUBJECTIVE STATEMENT: Still reports of pain on the L LE pain = 4/10. Patient states that she fell 4 days ago as she had another episode of the attack. Denies any injury from the fall. Patient is also concerned about the strength and tremors in her UE that has been going on.   EVAL: Arrives to the clinic with weakness and numbness on B legs, tremors on R hand, and difficulty with walking/standing. Patient also reports that the body is stiffening a lot. Condition started years before and had PT. However, patient did  not complete PT due to issues with scheduling. MRI of the lumbar spine and brain was done recently (see results below). Patient was then referred by her neurologist to outpatient PT evaluation and management with a diagnosis of conversion disorder.  PERTINENT HISTORY: Hx of herniated lumbar disc PAIN:  Are you having pain? Yes: NPRS scale: 8/10 Pain location: L LE Pain description: burning and tingling, tooth-ache like Aggravating factors: 2 min of  standing/walking Relieving factors: heating pad  PRECAUTIONS: Fall  RED FLAGS: Bowel or bladder incontinence: No and Spinal tumors: No   WEIGHT BEARING RESTRICTIONS: No  FALLS:  Has patient fallen in last 6 months? Yes. Number of falls 12  LIVING ENVIRONMENT: Lives with:  kids and husband Lives in: House/apartment (2nd floor) Stairs: Yes: External: 12-15 steps; bilateral but cannot reach both Has following equipment at home: Single point cane and standard walker  OCCUPATION: used to work at [employer] at [employer]  PLOF: Independent and Independent with basic ADLs  PATIENT GOALS: "trying to get back like I was"  NEXT MD VISIT: January 2025  OBJECTIVE:   DIAGNOSTIC FINDINGS:  07/21/22 CLINICAL DATA:  Lumbar radiculopathy, symptoms persist with > 6 wks treatment. Low back and left leg pain.   EXAM: MRI LUMBAR SPINE WITHOUT CONTRAST   TECHNIQUE: Multiplanar, multisequence MR imaging of the lumbar spine was performed. No intravenous contrast was administered.   COMPARISON:  Lumbar spine radiographs 12/05/2020   FINDINGS: Segmentation:  Standard.   Alignment:  Normal.   Vertebrae: No fracture or suspicious marrow lesion. Moderate degenerative endplate changes at L5-S1, Modic type 2 greater than 1.   Conus medullaris and cauda equina: Conus extends to the upper L1 level. Conus and cauda equina appear normal.   Paraspinal and other soft tissues: Unremarkable.   Disc levels:   T12-L1 and L1-2: Negative.   L2-3: Minimal disc bulging without stenosis.   L3-4: Mild disc bulging and mild facet hypertrophy without stenosis.   L4-5: Disc desiccation and mild-to-moderate disc space narrowing. Disc bulging, a central disc protrusion, and moderate facet and ligamentum flavum hypertrophy result in mild spinal stenosis, mild bilateral lateral recess stenosis, and mild bilateral neural foraminal stenosis.   L5-S1: Disc desiccation and moderate disc space  narrowing. Disc bulging, a large central to left subarticular disc protrusion, and mild facet hypertrophy result in mild spinal stenosis, mild right and severe left lateral recess stenosis, and mild left neural foraminal stenosis, likely with left S1 nerve root impingement.   IMPRESSION: 1. Large disc protrusion at L5-S1 with left lateral recess stenosis and S1 nerve root impingement. 2. Mild spinal and neural foraminal stenosis at L4-5.  04/30/22 CLINICAL DATA:  Neuro deficit, acute, stroke suspected.   EXAM: MRI HEAD WITHOUT CONTRAST   TECHNIQUE: Multiplanar, multiecho pulse sequences of the brain and surrounding structures were obtained without intravenous contrast.   COMPARISON:  Head CT earlier same day.  MRI 02/21/2022   FINDINGS: Brain: The brain has a normal appearance without evidence of malformation, atrophy, old or acute small or large vessel infarction, mass lesion, hemorrhage, hydrocephalus or extra-axial collection.   Vascular: Major vessels at the base of the brain show flow. Venous sinuses appear patent.   Skull and upper cervical spine: Normal.   Sinuses/Orbits: Clear/normal.   Other: None significant.   IMPRESSION: Normal examination. No abnormality seen to explain the presenting symptoms.  PATIENT SURVEYS:  LEFS 10/80 = 12.5%  COGNITION: Overall cognitive status: Within functional limits for tasks assessed  SENSATION: WFL  COORDINATION:  Heel-to-shin: impaired  Alternating foot taps: WFL  MUSCLE LENGTH: Hamstrings: Right mild restriction deg; Left mild restriction  POSTURE: rounded shoulders and forward head  PALPATION: Grade 2 tenderness on major muscle mass of B LE  LOWER EXTREMITY ROM:  Active ROM Right eval Left eval  Hip flexion [facility]  Hip extension    Hip abduction [facility] WFL  Hip adduction [facility]  Hip internal rotation    Hip external rotation    Knee flexion 25% limited* 25% limited*  Knee extension 25% limited*  25% limited*  Ankle dorsiflexion 25% limited* 25% limited*  Ankle plantarflexion 25% limited* 25% limited*  Ankle inversion    Ankle eversion     (Blank rows = not tested)  *due to pain and weakness  LOWER EXTREMITY MMT:  MMT Right eval Left eval  Hip flexion 4 4  Hip extension    Hip abduction 4 4  Hip adduction 4 4  Hip internal rotation    Hip external rotation    Knee flexion 2+ 2+  Knee extension 2+ 2+  Ankle dorsiflexion 2+ 2+  Ankle plantarflexion 2+ 2+  Ankle inversion    Ankle eversion     (Blank rows = not tested)   FUNCTIONAL TESTS:  5 times sit to stand: 39 sec with patient holding to thighs and SPC and some assist from PT 2 minute walk test: 25 ft (Patient fainted and passed out after 1 min. Patient did not fall but was slowly sat in a chair. BP = 140/94 mmHg, SpO2 = 98%, HR = 80 bpm  GAIT: Distance walked: 25 ft Assistive device utilized: Single point cane on the R Level of assistance: CGA Comments: decreased step length on B, decreased swing phase on B, slightly wide BOS, mild to moderate unsteadiness seen.   TODAY'S TREATMENT:                                                                                                                              DATE:  10/08/22 (in order) Seated heel raises x 10 x 2 Seat hip abd isometrics with a belt x 3" x 10 x 2 Seated hip adduction squeeze  x 3" x 10 x 2 Seated heel slides with a towel x 10 x 2 on each  10/01/22 (in order) Recumbent bike, seat 9, x 5' Seated hip adduction squeeze  x 3" x 10 Seat hip abd isometrics with a belt x 3" x 10 Seated heel raises x 10 x 2  09/25/22 NuStep, arm 10, seat 8, level 1 x 5' Seated hamstring stretch x 30 x 3 Seated hip adduction squeeze  x 3" x 10 Seat hip abd isometrics with a belt x 3" x 10 Seated heel slides x 10 on each Seated heel raises x 10 x 2  09/17/22 Evaluation and patient education   PATIENT EDUCATION:  Education details: Written HEP provided and  reviewed Person  educated: Patient Education method: Explanation, Demonstration, and Verbal cues Education comprehension: returned demonstration  HOME EXERCISE PROGRAM: Access Code: [email or web address] URL: [email or web address] Date: 09/25/2022 Prepared by: Krystal Clark  Exercises - Seated Hamstring Stretch  - 1-2 x daily - 7 x weekly - 3 reps - 30 hold - Seated Hip Adduction Isometrics with Ball  - 1-2 x daily - 7 x weekly - 1 sets - 10 reps - 3 hold - Seated Isometric Hip Abduction with Belt  - 1-2 x daily - 7 x weekly - 1 sets - 10 reps - 3 hold - Seated Heel Slide  - 1-2 x daily - 7 x weekly - 1 sets - 10 reps - Seated Heel Raise  - 1 x daily - 7 x weekly - 2 sets - 10 reps  ASSESSMENT:  CLINICAL IMPRESSION: Interventions today were geared towards LE strengthening and flexibility. Still demonstrated sudden twitches and jerks on LE during hip abd, hip adduction squeeze, and heel raises. Reported of slight discomfort with the heel slides. Mild incoordination noted on heel slides. Noted to have some shallow but slight fast breathing during rest breaks. Patient is able to tolerate the exercises today better than the previous 2 sessions due as patient only had a mild episode of the "attack" (speech slightly interrupted) and did not pass out/lose consciousness. Patient needed to have frequent and prolonged rest breaks as well as a slowly paced activity. After a few minutes of rest, patient's speech went back to baseline and patient states that she is alright to be wheeled out from the treatment area. Patient was was still instructed to monitor herself and ask for help from staff if needed. Patient may need to be d/c from PT on the next visit if no progress has been made.  EVAL: Patient is a 42 y.o. female who was seen today for physical therapy evaluation and treatment for conversion disorder. Patient was diagnosed with conversion disorder by referring provider further defined by  difficulty with walking and stair negotiation due to pain, weakness, and decreased soft tissue extensibility. Skilled PT is required to address the impairments and functional limitations listed below.  Patient tolerated today's session [facility] for the tasks performed except when doing the 5x STS where patient demonstrated progressive weakness and required several attempts to perform a successful trial with increasing repetition. Patient reported at the end of the task the she's having an "attack". Patient was informed about not continuing with the due to progressive weakness but patient insisted to continue. After a minute of , patient fainted. Patient did not fall but was slowly sat in a chair. BP = 140/94 mmHg, SpO2 = 98%, HR = 80 bpm. To aid with recovery, patient was instructed to do slow deep breaths. After 10-15 minutes, patient fully recovered and was back to her consciousness and was able to walk out of the clinic with her [facility].  OBJECTIVE IMPAIRMENTS: Abnormal gait, decreased activity tolerance, decreased balance, decreased coordination, difficulty walking, decreased strength, impaired flexibility, and pain.   ACTIVITY LIMITATIONS: carrying, lifting, bending, squatting, stairs, transfers, bathing, toileting, dressing, self feeding, hygiene/grooming, locomotion level, and caring for others  PARTICIPATION LIMITATIONS: meal prep, cleaning, laundry, driving, shopping, occupation, and yard work  PERSONAL FACTORS: Behavior pattern, Time since onset of injury/illness/exacerbation, and 1-2 comorbidities: back pain and anxiety  are also affecting patient's functional outcome.   REHAB POTENTIAL: Fair    CLINICAL DECISION MAKING: Unstable/unpredictable  EVALUATION COMPLEXITY: High   GOALS: Goals reviewed with patient? Yes  SHORT TERM GOALS: Target date: 10/15/22 Pt will demonstrate indep in HEP to facilitate carry-over of skilled services and improve functional outcomes  Goal status:  INITIAL  LONG TERM GOALS: Target date: 11/12/22.  Pt will decrease 5TSTS by at least 3 seconds in order to demonstrate clinically significant improvement in LE strength and balance  Baseline: 39 sec Goal status: INITIAL  2.  Pt will increase by at least 40 ft in order to demonstrate clinically significant improvement in community ambulation  Baseline: 25 ft Goal status: INITIAL  3.  Pt will demonstrate increase in LE strength to 4-/5 to facilitate ease and safety in ambulation  Baseline: 2+ Goal status: INITIAL  4.  Pt will demonstrate improved flexibility in the LEs to slight restriction facilitate ease in ambulation and ADLs  Baseline: mild restriction Goal status: INITIAL  5.  Patient will demonstrate improved coordination as manifested by performing heel-to-shin accurately on B in 2/4 trials. Baseline: impaired Goal status: INITIAL   PLAN:  PT FREQUENCY: 1x/week  PT DURATION: 8 weeks  PLANNED INTERVENTIONS: Therapeutic exercises, Therapeutic activity, Neuromuscular re-education, Balance training, Gait training, Patient/Family education, Self Care, and Stair training  PLAN FOR NEXT SESSION: Re-assess net visit.   Tish Frederickson. Jacquelin Krajewski, PT, DPT, OCS Board-Certified Clinical Specialist in Orthopedic PT PT Compact Privilege # ([place]): [id number] T 10/08/2022, 11:29 AM

## 2022-10-09 ENCOUNTER — Encounter: Payer: Self-pay | Admitting: Family Medicine

## 2022-10-09 ENCOUNTER — Telehealth: Payer: Self-pay | Admitting: Family Medicine

## 2022-10-09 NOTE — Telephone Encounter (Signed)
Medical clearance [facility]  Noted Copied Sleeved (put in provider box)  Fax to [phone] Attn April

## 2022-10-10 ENCOUNTER — Encounter: Payer: Self-pay | Admitting: Family Medicine

## 2022-10-10 DIAGNOSIS — F447 Conversion disorder with mixed symptom presentation: Secondary | ICD-10-CM | POA: Insufficient documentation

## 2022-10-10 DIAGNOSIS — F459 Somatoform disorder, unspecified: Secondary | ICD-10-CM | POA: Insufficient documentation

## 2022-10-13 ENCOUNTER — Ambulatory Visit (HOSPITAL_COMMUNITY): Payer: Medicaid Other

## 2022-10-13 ENCOUNTER — Encounter (HOSPITAL_COMMUNITY): Payer: Self-pay

## 2022-10-13 DIAGNOSIS — R29898 Other symptoms and signs involving the musculoskeletal system: Secondary | ICD-10-CM | POA: Diagnosis not present

## 2022-10-13 DIAGNOSIS — M79604 Pain in right leg: Secondary | ICD-10-CM | POA: Diagnosis not present

## 2022-10-13 DIAGNOSIS — R262 Difficulty in walking, not elsewhere classified: Secondary | ICD-10-CM

## 2022-10-13 DIAGNOSIS — M79605 Pain in left leg: Secondary | ICD-10-CM | POA: Diagnosis not present

## 2022-10-13 DIAGNOSIS — M6281 Muscle weakness (generalized): Secondary | ICD-10-CM | POA: Diagnosis not present

## 2022-10-13 NOTE — Therapy (Signed)
OUTPATIENT PHYSICAL THERAPY LOWER EXTREMITY PROGRESS NOTE   Patient Name: Marissa [name] MRN: [id number] DOB:02-17-1980, 42 y.o., female Today's Date: 10/13/2022  Progress Note Reporting Period 09/17/22 to 10/13/22  See note below for Objective Data and Assessment of Progress/Goals.      END OF SESSION:  PT End of Session - 10/13/22 1106     Visit Number 5    Number of Visits 8    Date for PT Re-Evaluation 11/12/22    Authorization Type [place] Medicaid Health (healthy blue approved 5 visits from 09/17/22-11/15/22)    Authorization Time Period 09/17/22-11/15/22    Authorization - Visit Number 4    Authorization - Number of Visits 5    Progress Note Due on Visit 4    PT Start Time 1100    PT Stop Time 1145    PT Time Calculation (min) 45 min    Equipment Utilized During Treatment Gait belt    Activity Tolerance Treatment limited secondary to medical complications (Comment)   conversion disorder   Behavior During Therapy [facility] for tasks assessed/performed            Past Medical History:  Diagnosis Date   Allergy    seasonal   Anxiety    Back pain with right-sided radiculopathy 12/12/2020   Cerebrovascular accident (CVA) due to embolism (HCC) 10/08/2015   R  frontal   Common migraine with intractable migraine 11/08/2015   Encounter for examination following treatment at hospital 03/03/2022   Encounter for gynecological examination with Papanicolaou smear of cervix 12/30/2018   Hyperlipidemia    Stroke (HCC)    Vitamin D deficiency    Past Surgical History:  Procedure Laterality Date   CESAREAN SECTION     PATENT FORAMEN OVALE CLOSURE  09/25/2015   TUBAL LIGATION     Patient Active Problem List   Diagnosis Date Noted   Somatoform disorder, 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   Acute back pain with radiculopathy 07/06/2022   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   H/O Amplatzer  atrial septal defect closure 03/25/2022   Depression, major, single episode, moderate (HCC) 03/16/2022   Allergic rhinitis 03/16/2022   Abnormal EKG 03/03/2022   Encounter for examination following treatment at hospital 03/03/2022   Abnormal MRA, brain 10/25/2021   Back pain with right-sided radiculopathy 12/12/2020   Neurological disorder 09/07/2020   Speech disturbance 09/03/2020   Spell of abnormal behavior    Abnormal MRI of head    TIA (transient ischemic attack) 08/13/2020   History of CVA (cerebrovascular accident) 12/09/2018   Anxiety 12/09/2018   Obesity (BMI 30.0-34.9) 12/09/2018   Common migraine with intractable migraine 11/08/2015   PFO (patent foramen ovale) 10/08/2015   Vitamin D deficiency 10/08/2015    PCP: Kerri Perches MD  REFERRING PROVIDER: Peyton Bottoms, MD  REFERRING DIAG: F44.7 (ICD-10-CM) - Conversion disorder with mixed symptom presentation  THERAPY DIAG:  Muscle weakness (generalized)  Pain in both lower extremities  Difficulty in walking, not elsewhere classified  Other symptoms and signs involving the musculoskeletal system  Rationale for Evaluation and Treatment: Rehabilitation  ONSET DATE: Years ago  SUBJECTIVE:   SUBJECTIVE STATEMENT: Patient of reports of having L LE pain = 9/10 and and weakness. Patient states that she still has the "attacks" of the conversion disorder. Patient states that she's doing her HEP which can trigger the "attacks" at times. Patient states that she feels that her condition is unchanged  since the start of PT. Patient states that she thinks that PT is not needed at the moment as the body is not as strong enough to endure the activity in the clinic.  EVAL: Arrives to the clinic with weakness and numbness on B legs, tremors on R hand, and difficulty with walking/standing. Patient also reports that the body is stiffening a lot. Condition started years before and had PT. However, patient did not complete PT  due to issues with scheduling. MRI of the lumbar spine and brain was done recently (see results below). Patient was then referred by her neurologist to outpatient PT evaluation and management with a diagnosis of conversion disorder.  PERTINENT HISTORY: Hx of herniated lumbar disc PAIN:  Are you having pain? Yes: NPRS scale: 8/10 Pain location: L LE Pain description: burning and tingling, tooth-ache like Aggravating factors: 2 min of standing/walking Relieving factors: heating pad  PRECAUTIONS: Fall  RED FLAGS: Bowel or bladder incontinence: No and Spinal tumors: No   WEIGHT BEARING RESTRICTIONS: No  FALLS:  Has patient fallen in last 6 months? Yes. Number of falls 12  LIVING ENVIRONMENT: Lives with:  kids and husband Lives in: House/apartment (2nd floor) Stairs: Yes: External: 12-15 steps; bilateral but cannot reach both Has following equipment at home: Single point cane and standard walker  OCCUPATION: used to work at [employer] at [employer]  PLOF: Independent and Independent with basic ADLs  PATIENT GOALS: "trying to get back like I was"  NEXT MD VISIT: January 2025  OBJECTIVE: all findings are from initial evaluation (09/17/22) unless otherwise dated.  DIAGNOSTIC FINDINGS:  07/21/22 CLINICAL DATA:  Lumbar radiculopathy, symptoms persist with > 6 wks treatment. Low back and left leg pain.   EXAM: MRI LUMBAR SPINE WITHOUT CONTRAST   TECHNIQUE: Multiplanar, multisequence MR imaging of the lumbar spine was performed. No intravenous contrast was administered.   COMPARISON:  Lumbar spine radiographs 12/05/2020   FINDINGS: Segmentation:  Standard.   Alignment:  Normal.   Vertebrae: No fracture or suspicious marrow lesion. Moderate degenerative endplate changes at L5-S1, Modic type 2 greater than 1.   Conus medullaris and cauda equina: Conus extends to the upper L1 level. Conus and cauda equina appear normal.   Paraspinal and other soft tissues:  Unremarkable.   Disc levels:   T12-L1 and L1-2: Negative.   L2-3: Minimal disc bulging without stenosis.   L3-4: Mild disc bulging and mild facet hypertrophy without stenosis.   L4-5: Disc desiccation and mild-to-moderate disc space narrowing. Disc bulging, a central disc protrusion, and moderate facet and ligamentum flavum hypertrophy result in mild spinal stenosis, mild bilateral lateral recess stenosis, and mild bilateral neural foraminal stenosis.   L5-S1: Disc desiccation and moderate disc space narrowing. Disc bulging, a large central to left subarticular disc protrusion, and mild facet hypertrophy result in mild spinal stenosis, mild right and severe left lateral recess stenosis, and mild left neural foraminal stenosis, likely with left S1 nerve root impingement.   IMPRESSION: 1. Large disc protrusion at L5-S1 with left lateral recess stenosis and S1 nerve root impingement. 2. Mild spinal and neural foraminal stenosis at L4-5.  04/30/22 CLINICAL DATA:  Neuro deficit, acute, stroke suspected.   EXAM: MRI HEAD WITHOUT CONTRAST   TECHNIQUE: Multiplanar, multiecho pulse sequences of the brain and surrounding structures were obtained without intravenous contrast.   COMPARISON:  Head CT earlier same day.  MRI 02/21/2022   FINDINGS: Brain: The brain has a normal appearance without evidence of malformation, atrophy,  old or acute small or large vessel infarction, mass lesion, hemorrhage, hydrocephalus or extra-axial collection.   Vascular: Major vessels at the base of the brain show flow. Venous sinuses appear patent.   Skull and upper cervical spine: Normal.   Sinuses/Orbits: Clear/normal.   Other: None significant.   IMPRESSION: Normal examination. No abnormality seen to explain the presenting symptoms.  PATIENT SURVEYS:  LEFS 10/13/22: 13/80 = 16.3% from 10/80 = 12.5%  COGNITION: Overall cognitive status: Within functional limits for tasks  assessed     SENSATION: WFL  COORDINATION:  Heel-to-shin: impaired  Alternating foot taps: WFL  MUSCLE LENGTH: Hamstrings: Right mild restriction deg; Left mild restriction  POSTURE: rounded shoulders and forward head  PALPATION: Grade 2 tenderness on major muscle mass of B LE  LOWER EXTREMITY ROM:  Active ROM Right eval Left eval Right 10/13/22 Left 10/13/22  Hip flexion [facility] WFL  Hip extension      Hip abduction [facility] WFL  Hip adduction WFL [facility] WFL  Hip internal rotation      Hip external rotation      Knee flexion 25% limited* 25% limited* 25% limited* 25% limited*  Knee extension 25% limited* 25% limited* 25% limited* 25% limited*  Ankle dorsiflexion 25% limited* 25% limited* [facility] WFL  Ankle plantarflexion 25% limited* 25% limited* [facility] WFl  Ankle inversion      Ankle eversion       (Blank rows = not tested)  *due to pain and weakness  LOWER EXTREMITY MMT:  MMT Right eval Left eval Right 10/13/22 Left 10/13/22  Hip flexion 4 4 3+ 4  Hip extension      Hip abduction 4 4 4- 4-  Hip adduction 4 4 4- 4-  Hip internal rotation      Hip external rotation      Knee flexion 2+ 2+ 2+  2-  Knee extension 2+ 2+ 2+ 2-  Ankle dorsiflexion 2+ 2+ 2+ 2-  Ankle plantarflexion 2+ 2+ 2+ 2-  Ankle inversion      Ankle eversion       (Blank rows = not tested)   FUNCTIONAL TESTS:  5 times sit to stand: 10/13/22: 1 min 26 sec (without the use of SPC but needs mod assist from PT)  from 39 sec with patient holding to thighs and SPC and some assist from PT 2 minute walk test: 10/13/22: not tested, 09/17/22: 25 ft (Patient fainted and passed out after 1 min. Patient did not fall but was slowly sat in a chair. BP = 140/94 mmHg, SpO2 = 98%, HR = 80 bpm  GAIT: Distance walked: 25 ft Assistive device utilized: Single point cane on the R Level of assistance: CGA Comments: decreased step length on B, decreased swing phase on B, slightly wide BOS, mild to moderate  unsteadiness seen.   TODAY'S TREATMENT:  DATE:  10/13/22 Progress note (5TSTS, subjective information, ROM, MMT)  10/08/22 (in order) Seated heel raises x 10 x 2 Seat hip abd isometrics with a belt x 3" x 10 x 2 Seated hip adduction squeeze  x 3" x 10 x 2 Seated heel slides with a towel x 10 x 2 on each  10/01/22 (in order) Recumbent bike, seat 9, x 5' Seated hip adduction squeeze  x 3" x 10 Seat hip abd isometrics with a belt x 3" x 10 Seated heel raises x 10 x 2  09/25/22 NuStep, arm 10, seat 8, level 1 x 5' Seated hamstring stretch x 30 x 3 Seated hip adduction squeeze  x 3" x 10 Seat hip abd isometrics with a belt x 3" x 10 Seated heel slides x 10 on each Seated heel raises x 10 x 2  09/17/22 Evaluation and patient education   PATIENT EDUCATION:  Education details: Education on the importance of continuing HEP and reason for D/C. Person educated: Patient and Parent Education method: Explanation Education comprehension: verbalized understanding  HOME EXERCISE PROGRAM: Access Code: [email or web address] URL: [email or web address] Date: 09/25/2022 Prepared by: Krystal Clark  Exercises - Seated Hamstring Stretch  - 1-2 x daily - 7 x weekly - 3 reps - 30 hold - Seated Hip Adduction Isometrics with Ball  - 1-2 x daily - 7 x weekly - 1 sets - 10 reps - 3 hold - Seated Isometric Hip Abduction with Belt  - 1-2 x daily - 7 x weekly - 1 sets - 10 reps - 3 hold - Seated Heel Slide  - 1-2 x daily - 7 x weekly - 1 sets - 10 reps - Seated Heel Raise  - 1 x daily - 7 x weekly - 2 sets - 10 reps  ASSESSMENT:  CLINICAL IMPRESSION: While taking subjective information today, patient reported of mod to severe muscle spasm on the low back that came abruptly. Patient took some mustard sauce given by her mother which helped with the spasm. When the spasm subsided,  objective tests and measures were performed. Patient demonstrated no significant change in function and impairments as reflected on the subjective and objective findings. However, 5x sit-to-stand was found to be significantly slower today due to less assistance from a cane. Patient performed 5x sit-to-stand with mod difficulty and exertion. Rest periods were provided. With this, other functional tests such as the was not done to prevent another episode/"attack" from the conversion disorder from happening. Over the past weeks, patient's activities were regressed in order to accommodate to patient's tolerance from physical activity. However, patient still gets episodes from her condition despite frequent rest and regression of activities. With this, skilled PT is not appropriate at this time due to inability to tolerate simple physical activities as manifested by the presence of "attacks" on each session. Patient may still benefit from doing her HEP (with hamstring stretch removed) with repetitions/sets as tolerated. Patient is now d/c from skilled PT.  EVAL: Patient is a 42 y.o. female who was seen today for physical therapy evaluation and treatment for conversion disorder. Patient was diagnosed with conversion disorder by referring provider further defined by difficulty with walking and stair negotiation due to pain, weakness, and decreased soft tissue extensibility. Skilled PT is required to address the impairments and functional limitations listed below.  Patient tolerated today's session [facility] for the tasks performed except when doing the 5x STS where patient demonstrated progressive weakness and required several attempts to perform a successful trial  with increasing repetition. Patient reported at the end of the task the she's having an "attack". Patient was informed about not continuing with the due to progressive weakness but patient insisted to continue. After a minute of , patient fainted.  Patient did not fall but was slowly sat in a chair. BP = 140/94 mmHg, SpO2 = 98%, HR = 80 bpm. To aid with recovery, patient was instructed to do slow deep breaths. After 10-15 minutes, patient fully recovered and was back to her consciousness and was able to walk out of the clinic with her [facility].  OBJECTIVE IMPAIRMENTS: Abnormal gait, decreased activity tolerance, decreased balance, decreased coordination, difficulty walking, decreased strength, impaired flexibility, and pain.   ACTIVITY LIMITATIONS: carrying, lifting, bending, squatting, stairs, transfers, bathing, toileting, dressing, self feeding, hygiene/grooming, locomotion level, and caring for others  PARTICIPATION LIMITATIONS: meal prep, cleaning, laundry, driving, shopping, occupation, and yard work  PERSONAL FACTORS: Behavior pattern, Time since onset of injury/illness/exacerbation, and 1-2 comorbidities: back pain and anxiety  are also affecting patient's functional outcome.   REHAB POTENTIAL: Fair    CLINICAL DECISION MAKING: Unstable/unpredictable  EVALUATION COMPLEXITY: High   GOALS: Goals reviewed with patient? Yes  SHORT TERM GOALS: Target date: 10/15/22 Pt will demonstrate indep in HEP to facilitate carry-over of skilled services and improve functional outcomes  Goal status: MET  LONG TERM GOALS: Target date: 11/12/22.  Pt will decrease 5TSTS by at least 3 seconds in order to demonstrate clinically significant improvement in LE strength and balance  Baseline: 39 sec Goal status: NOT MET  2.  Pt will increase by at least 40 ft in order to demonstrate clinically significant improvement in community ambulation  Baseline: 25 ft Goal status: NOT ASSESSED  3.  Pt will demonstrate increase in LE strength to 4-/5 to facilitate ease and safety in ambulation  Baseline: 2+ Goal status: NOT MET  4.  Pt will demonstrate improved flexibility in the LEs to slight restriction facilitate ease in ambulation and ADLs   Baseline: mild restriction Goal status: NOT ASSESSED  5.  Patient will demonstrate improved coordination as manifested by performing heel-to-shin accurately on B in 2/4 trials. Baseline: impaired Goal status: NOT ASSESSED   PLAN:  PT FREQUENCY:  0  PT DURATION: other: 0  PLANNED INTERVENTIONS:  Skilled PT not required at this time. Patient is d/c from PT and just need to continue her HEP as tolerated.   Marissa [name], PT, DPT, OCS Board-Certified Clinical Specialist in Orthopedic PT PT Compact Privilege # ([place]): [phone] T 10/13/2022, 1:52 PM

## 2022-10-13 NOTE — Therapy (Signed)
PHYSICAL THERAPY DISCHARGE SUMMARY  Visits from Start of Care: 5  Current functional level related to goals / functional outcomes: See progress note 10/13/22   Remaining deficits: See progress note 10/13/22   Education / Equipment: See progress note 10/13/22   Patient agrees to discharge. Patient goals were not met. Patient is being discharged due to did not respond to therapy.   Tish Frederickson. Oisin Yoakum, PT, DPT, OCS Board-Certified Clinical Specialist in Orthopedic PT PT Compact Privilege # ([place]): [phone] T

## 2022-10-15 ENCOUNTER — Ambulatory Visit (HOSPITAL_COMMUNITY): Payer: Medicaid Other

## 2022-10-20 ENCOUNTER — Telehealth: Payer: Self-pay | Admitting: Family Medicine

## 2022-10-20 ENCOUNTER — Encounter: Payer: Self-pay | Admitting: Family Medicine

## 2022-10-20 NOTE — Telephone Encounter (Signed)
CAP  Noted  Copied Sleeved  Original in PCP box Copy front desk folder

## 2022-10-20 NOTE — Telephone Encounter (Signed)
Forms completed & faxed- put in completed folder

## 2022-10-27 ENCOUNTER — Other Ambulatory Visit: Payer: Medicaid Other | Admitting: Obstetrics and Gynecology

## 2022-10-27 NOTE — Patient Outreach (Signed)
  Medicaid Managed Care   Unsuccessful Attempt Note   10/27/2022 Name: Marissa [name] MRN: [id number] DOB: [birth year]  Referred by: Kerri Perches, MD Reason for referral : High Risk Managed Medicaid (Unsuccessful telephone outreach)  An unsuccessful telephone outreach was attempted today. The patient was referred to the case management team for assistance with care management and care coordination.    Follow Up Plan: The Managed Medicaid care management team will reach out to the patient again over the next 30 business  days. and The  Patient has been provided with contact information for the Managed Medicaid care management team and has been advised to call with any health related questions or concerns.   [name] RN, BSN [place]  [employer], production - Managed Medicaid High Risk [phone]

## 2022-10-27 NOTE — Patient Instructions (Signed)
Hi Marissa [name], I am sorry to miss you today, I hope you are okay  - as a part of your Medicaid benefit, you are eligible for care management and care coordination services at no cost or copay. I was unable to reach you by phone today but would be happy to help you with your health related needs. Please feel free to call me at [phone].  A member of the Managed Medicaid care management team will reach out to you again over the next 30 business days.   [name] RN, BSN [place]  [employer], production - Managed Medicaid High Risk [phone]

## 2022-11-04 ENCOUNTER — Telehealth: Payer: Self-pay | Admitting: Family Medicine

## 2022-11-04 NOTE — Telephone Encounter (Signed)
Mother called in on patient behalf.  Patient was referred to a provider by Social services and was seen today . The provider is suggesting patient have wheelchair.  Wants a call back  to discuss with pcp

## 2022-11-05 ENCOUNTER — Encounter: Payer: Self-pay | Admitting: Family Medicine

## 2022-11-05 NOTE — Telephone Encounter (Signed)
Patient aware.

## 2022-11-25 ENCOUNTER — Other Ambulatory Visit: Payer: Self-pay | Admitting: Obstetrics and Gynecology

## 2022-11-25 NOTE — Patient Instructions (Signed)
Hi Marissa [name], thank you for the updates today-I hope the rest of your day goes okay.  Marissa [name] was given information about Medicaid Managed Care team care coordination services as a part of their Healthy [facility] Medicaid benefit. Marissa [name] verbally consented to engagement with the [facility] Managed Care team.   If you are experiencing a medical emergency, please call 911 or report to your local emergency department or urgent care.   If you have a non-emergency medical problem during routine business hours, please contact your provider's office and ask to speak with a nurse.   For questions related to your Healthy [facility] health plan, please call: [phone] or visit the homepage here: [email or web address]  If you would like to schedule transportation through your Healthy [facility] plan, please call the following number at least 2 days in advance of your appointment: [phone]  For information about your ride after you set it up, call Ride Assist at [phone]. Use this number to activate a Will Call pickup, or if your transportation is late for a scheduled pickup. Use this number, too, if you need to make a change or cancel a previously scheduled reservation.  If you need transportation services right away, call [phone]. The after-hours call center is staffed 24 hours to handle ride assistance and urgent reservation requests (including discharges) 365 days a year. Urgent trips include sick visits, hospital discharge requests and life-sustaining treatment.  Call the [facility] Line at [phone], at any time, 24 hours a day, 7 days a week. If you are in danger or need immediate medical attention call 911.  If you would like help to quit smoking, call 1-800-QUIT-NOW (806-507-2329) OR Espaol: 1-855-Djelo-Ya (6-606-301-6010) o para ms informacin haga clic aqu or Text READY to 932-355 to register via text  Marissa [name]  - following are the goals we discussed in your visit today:   Goals Addressed    Timeframe:  Long-Range Goal Priority:  High Start Date:  06/18/22                           Expected End Date:    ongoing                   Follow Up Date: 12/25/22   - practice safe sex - schedule appointment for vaccines needed due to my age or health - schedule recommended health tests (blood work, mammogram, colonoscopy, pap test) - schedule and keep appointment for annual check-up    Why is this important?   Screening tests can find diseases early when they are easier to treat.  Your doctor or nurse will talk with you about which tests are important for you.  Getting shots for common diseases like the flu and shingles will help prevent them.  11/25/22:  on wait list for ST/CBT-? December start date.  Patient verbalizes understanding of instructions and care plan provided today and agrees to view in MyChart. Active MyChart status and patient understanding of how to access instructions and care plan via MyChart confirmed with patient.     The Managed Medicaid care management team will reach out to the patient again over the next 30 business  days.  The  Patient has been provided with contact information for the Managed Medicaid care management team and has been advised to call with any health related questions or concerns.   [name] RN, BSN [place]  [facility] [employer]  Management Coordinator - Managed Medicaid High Risk [phone]   Following is a copy of your plan of care:  Care Plan : RN Care Manager Plan of Care  Updates made by [name], RN since 11/25/2022 12:00 AM     Problem: Health Promotion or Disease Self-Management (General Plan of Care)      Long-Range Goal: Chronic Disease Management and Care Coordination Needs   Start Date: 06/18/2022  Expected End Date: 02/25/2023  Priority: High  Note:   Current Barriers:  Knowledge Deficits related to plan of care for  management of migraines, rhinitis, back pain, h/o CVA Care Coordination needs related to NEURO appointment-completed. Chronic Disease Management support and education needs related to migraines, rhinitis, back pain, h/o CVA 11/25/22:  PT completed-to have eval for w/c.  On wait list for ST/CBT-? Dec. start date.  Recent evals by SS for disability.  States she os now having outbursts of screaming/laughter.  RNCM Clinical Goal(s):  Patient will verbalize understanding of plan for management of migraines, rhinitis, back pain, h/o CVA as evidenced by patient report. verbalize basic understanding of migraines, rhinitis, back pain, h/o CVA disease process and self health management plan as evidenced by patient report. take all medications exactly as prescribed and will call provider for medication related questions as evidenced by patient report. demonstrate understanding of rationale for each prescribed medication as evidenced by patient report attend all scheduled medical appointments as evidenced by patient report and EMR review Demonstrate ongoing  adherence to prescribed treatment plan for migraines, rhinitis, back pain, h/o CVA as evidenced by patient report and EMR review continue to work with RN Care Manager to address care management and care coordination needs related to migraines, rhinitis, back pain, h/o CVA as evidenced by adherence to CM Team Scheduled appointments through collaboration with RN Care manager, provider, and care team.   Interventions: Inter-disciplinary care team collaboration (see longitudinal plan of care) Evaluation of current treatment plan related to  self management and patient's adherence to plan as established by provider   (Status:  New goal.)  Long Term Goal Evaluation of current treatment plan related to migraines, rhinitis, back pain, h/o CVA self-management and patient's adherence to plan as established by provider. Discussed plans with patient for ongoing care  management follow up and provided patient with direct contact information for care management team Reviewed medications with patient Collaborated with PCP  regarding appointment Reviewed scheduled/upcoming provider appointments Discussed plans with patient for ongoing care management follow up and provided patient with direct contact information for care management team Assessed social determinant of health barriers 06/18/22:  Message sent to provider to request assistance with NEURO appt.-completed.  Patient Goals/Self-Care Activities: Take all medications as prescribed Attend all scheduled provider appointments Call pharmacy for medication refills 3-7 days in advance of running out of medications Perform all self care activities independently  Perform IADL's (shopping, preparing meals, housekeeping, managing finances) independently Call provider office for new concerns or questions  08/22/22:  patient will f/u regarding therapy appts.-completed  Follow Up Plan:  The patient has been provided with contact information for the care management team and has been advised to call with any health related questions or concerns.  The care management team will reach out to the patient again over the next 30 business  days.

## 2022-11-25 NOTE — Patient Outreach (Signed)
Medicaid Managed Care   Nurse Care Manager Note  11/25/2022 Name:  Marissa [name] MRN:  [id number] DOB:  [birth year]  Marissa [name] is an 42 y.o. year old female who is a primary patient of Marissa Perches, MD.  The [facility] Managed Care Coordination team was consulted for assistance with:    Chronic healthcare management needs, migraines, rhinitis, chronic pain, h/o CVA, functional movement disorder, herniated disc, right arm tremor, non epileptic seizures  [name] was given information about Medicaid Managed Care Coordination team services today. Marissa [name] Patient agreed to services and verbal consent obtained.  Engaged with patient by telephone for follow up visit in response to provider referral for case management and/or care coordination services.   Assessments/Interventions:  Review of past medical history, allergies, medications, health status, including review of consultants reports, laboratory and other test data, was performed as part of comprehensive evaluation and provision of chronic care management services.  SDOH (Social Determinants of Health) assessments and interventions performed: SDOH Interventions    Flowsheet Row Patient Outreach Telephone from 11/25/2022 in [place] POPULATION HEALTH DEPARTMENT Patient Outreach Telephone from 09/25/2022 in [place] POPULATION HEALTH DEPARTMENT Patient Outreach Telephone from 08/22/2022 in [place] POPULATION HEALTH DEPARTMENT Patient Outreach Telephone from 07/21/2022 in  POPULATION HEALTH DEPARTMENT Patient Outreach Telephone from 06/18/2022 in  POPULATION HEALTH DEPARTMENT Office Visit from 03/14/2022 in [facility]  SDOH Interventions        Food Insecurity Interventions Intervention Not Indicated -- -- -- Intervention Not Indicated --  Housing Interventions -- -- -- -- Intervention Not Indicated --  Transportation Interventions -- -- -- -- Intervention Not Indicated --   Utilities Interventions -- -- -- -- Intervention Not Indicated --  Alcohol Usage Interventions -- -- -- Intervention Not Indicated (Score <7) -- --  Depression Interventions/Treatment  -- -- -- -- -- Counseling, [id number] Referral  Financial Strain Interventions -- -- Intervention Not Indicated -- -- --  Physical Activity Interventions -- Intervention Not Indicated, Other (Comments)  [patient not physically able to engage in this type of exercise] -- -- -- --  Social Connections Interventions Intervention Not Indicated -- -- -- -- --  Health Literacy Interventions -- -- Intervention Not Indicated -- -- --     Care Plan Allergies  Allergen Reactions   Triptans     Cannot use triptans for migraine per neurology due to stroke history   Morphine And Codeine Hives    Feels like skin is burning     Medications Reviewed Today     Reviewed by [name], RN (Registered Nurse) on 11/25/22 at 1235  Med List Status: <None>   Medication Order Taking? Sig Documenting Provider Last Dose Status Informant  butalbital-acetaminophen-caffeine (FIORICET) 50-325-40 MG tablet [id number] No Take one tablet by mouth twice daily, as needed, for headache. Maximum is twice per week Marissa Perches, MD Taking Active   ergocalciferol (VITAMIN D2) 1.25 MG (50000 UT) capsule [id number] No Take 1 capsule (50,000 Units total) by mouth once a week. One capsule once weekly Marissa Perches, MD Taking Active   gabapentin (NEURONTIN) 300 MG capsule [id number] No Take 1 capsule (300 mg total) by mouth 3 (three) times daily. 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  Acute back pain with radiculopathy 07/06/2022   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   H/O Amplatzer atrial septal defect  closure 03/25/2022   Depression, major, single episode, moderate (HCC) 03/16/2022   Allergic rhinitis 03/16/2022   Abnormal EKG 03/03/2022   Encounter for examination following treatment at hospital 03/03/2022   Abnormal MRA, brain 10/25/2021   Back pain with right-sided radiculopathy 12/12/2020   Neurological disorder 09/07/2020   Speech disturbance 09/03/2020   Spell of abnormal behavior    Abnormal MRI of head    TIA (transient ischemic attack) 08/13/2020   History of CVA (cerebrovascular accident) 12/09/2018   Anxiety 12/09/2018   Obesity (BMI 30.0-34.9) 12/09/2018   Common migraine with intractable migraine 11/08/2015   PFO (patent foramen ovale) 10/08/2015   Vitamin D deficiency 10/08/2015   Conditions to be addressed/monitored per PCP order:  Chronic healthcare management needs, migraines, rhinitis, chronic pain, h/o CVA, functional movement disorder, herniated disc, right arm tremor, non epileptic seizures  Care Plan : RN Care Manager Plan of Care  Updates made by [name], RN since 11/25/2022 12:00 AM     Problem: Health Promotion or Disease Self-Management (General Plan of Care)      Long-Range Goal: Chronic Disease Management and Care Coordination Needs   Start Date: 06/18/2022  Expected End Date: 02/25/2023  Priority: High  Note:   Current Barriers:  Knowledge Deficits related to plan of care for management of migraines, rhinitis, back pain, h/o CVA Care Coordination needs related to NEURO appointment-completed. Chronic Disease Management support and education needs related to migraines, rhinitis, back pain, h/o CVA 11/25/22:  PT completed-to have eval for w/c.  On wait list for ST/CBT-? Dec. start date.  Recent evals by SS for disability.  States she os now having outbursts of screaming/laughter.  RNCM Clinical Goal(s):  Patient will verbalize understanding of plan for management of migraines, rhinitis, back pain, h/o CVA as evidenced by patient  report. verbalize basic understanding of migraines, rhinitis, back pain, h/o CVA disease process and self health management plan as evidenced by patient report. take all medications exactly as prescribed and will call provider for medication related questions as evidenced by patient report. demonstrate understanding of rationale for each prescribed medication as evidenced by patient report attend all scheduled medical appointments as evidenced by patient report and EMR review Demonstrate ongoing  adherence to prescribed treatment plan for migraines, rhinitis, back pain, h/o CVA as evidenced by patient report and EMR review continue to work with RN Care Manager to address care management and care coordination needs related to migraines, rhinitis, back pain, h/o CVA as evidenced by adherence to CM Team Scheduled appointments through collaboration with RN Care manager, provider, and care team.   Interventions: Inter-disciplinary care team collaboration (see longitudinal plan of care) Evaluation of current treatment plan related to  self management and patient's adherence to plan as established by provider   (Status:  New goal.)  Long Term Goal Evaluation of current treatment plan related to migraines, rhinitis, back pain, h/o CVA self-management and patient's adherence to plan as established by provider. Discussed plans with patient for ongoing care management follow up and provided patient with direct contact information for care management team Reviewed medications with patient Collaborated with PCP  regarding appointment Reviewed scheduled/upcoming provider appointments Discussed plans with patient for ongoing care management follow up and provided patient with direct contact information for care management team Assessed social determinant of health barriers 06/18/22:  Message sent to  provider to request assistance with NEURO appt.-completed.  Patient Goals/Self-Care Activities: Take all  medications as prescribed Attend all scheduled provider appointments Call pharmacy for medication refills 3-7 days in advance of running out of medications Perform all self care activities independently  Perform IADL's (shopping, preparing meals, housekeeping, managing finances) independently Call provider office for new concerns or questions  08/22/22:  patient will f/u regarding therapy appts.-completed  Follow Up Plan:  The patient has been provided with contact information for the care management team and has been advised to call with any health related questions or concerns.  The care management team will reach out to the patient again over the next 30 business  days.   Long-Range Goal: Establish Plan of care for Chronic Disease Management Needs and Care Coordination Needs   Priority: High  Note:   Timeframe:  Long-Range Goal Priority:  High Start Date:  06/18/22                           Expected End Date:    ongoing                   Follow Up Date: 12/25/22   - practice safe sex - schedule appointment for vaccines needed due to my age or health - schedule recommended health tests (blood work, mammogram, colonoscopy, pap test) - schedule and keep appointment for annual check-up    Why is this important?   Screening tests can find diseases early when they are easier to treat.  Your doctor or nurse will talk with you about which tests are important for you.  Getting shots for common diseases like the flu and shingles will help prevent them.  11/25/22:  on wait list for ST/CBT-? December start date.   Follow Up:  Patient agrees to Care Plan and Follow-up.  Plan: The Managed Medicaid care management team will reach out to the patient again over the next 30 business  days. and The  Patient has been provided with contact information for the Managed Medicaid care management team and has been advised to call with any health related questions or concerns.  Date/time of next scheduled RN  care management/care coordination outreach:  12/25/22 at 1030

## 2022-12-01 ENCOUNTER — Other Ambulatory Visit: Payer: Self-pay | Admitting: Family Medicine

## 2022-12-03 MED ORDER — BUTALBITAL-APAP-CAFFEINE 50-325-40 MG PO TABS: ORAL_TABLET | ORAL | 0 refills | Status: DC

## 2022-12-04 ENCOUNTER — Ambulatory Visit (HOSPITAL_COMMUNITY): Payer: Medicaid Other | Attending: Neurology | Admitting: Occupational Therapy

## 2022-12-04 DIAGNOSIS — R29898 Other symptoms and signs involving the musculoskeletal system: Secondary | ICD-10-CM | POA: Insufficient documentation

## 2022-12-04 DIAGNOSIS — R278 Other lack of coordination: Secondary | ICD-10-CM | POA: Insufficient documentation

## 2022-12-05 NOTE — Therapy (Signed)
[facility] at [facility] [address] [place], [place], [address] Phone: [phone]   Fax:  [phone]  Occupational Therapy Evaluation  Patient Details  Name: Marissa [name] MRN: [id number] Date of Birth: [birth year] No data recorded  Encounter Date: 12/04/2022   OT End of Session - 12/05/22 0943     Visit Number 1    Number of Visits 1    Date for OT Re-Evaluation 12/05/22    Authorization Type [employer] [place]    OT Start Time [phone]    OT Stop Time 0922    OT Time Calculation (min) 30 min    Activity Tolerance Patient tolerated treatment well    Behavior During Therapy [facility] for tasks assessed/performed             Past Medical History:  Diagnosis Date   Allergy    seasonal   Anxiety    Back pain with right-sided radiculopathy 12/12/2020   Cerebrovascular accident (CVA) due to embolism (HCC) 10/08/2015   R  frontal   Common migraine with intractable migraine 11/08/2015   Encounter for examination following treatment at hospital 03/03/2022   Encounter for gynecological examination with Papanicolaou smear of cervix 12/30/2018   Hyperlipidemia    Stroke [facility])    Vitamin D deficiency     Past Surgical History:  Procedure Laterality Date   CESAREAN SECTION     PATENT FORAMEN OVALE CLOSURE  09/25/2015   TUBAL LIGATION      Visit Diagnosis: Other lack of coordination  Other symptoms and signs involving the musculoskeletal system    Problem List Patient Active Problem List   Diagnosis Date Noted   Somatoform disorder, 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   Acute back pain with radiculopathy 07/06/2022   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   H/O Amplatzer atrial septal defect closure 03/25/2022   Depression, major, single episode, moderate (HCC) 03/16/2022   Allergic rhinitis 03/16/2022   Abnormal EKG 03/03/2022   Encounter for  examination following treatment at hospital 03/03/2022   Abnormal MRA, brain 10/25/2021   Back pain with right-sided radiculopathy 12/12/2020   Neurological disorder 09/07/2020   Speech disturbance 09/03/2020   Spell of abnormal behavior    Abnormal MRI of head    TIA (transient ischemic attack) 08/13/2020   History of CVA (cerebrovascular accident) 12/09/2018   Anxiety 12/09/2018   Obesity (BMI 30.0-34.9) 12/09/2018   Common migraine with intractable migraine 11/08/2015   PFO (patent foramen ovale) 10/08/2015   Vitamin D deficiency 10/08/2015   This session, pt presents to OT session for a Wheel chair Evaluation. She was able to walk into the clinic and back to the assessment room (47ft), with a single point cane. Overall pt is a high fall risk, with multiple falls reported in the past month, due to her neurologic disorder. OT and pt discussed timeline of receiving a WC, as well as likelihood of insurance approving due to ambulation ability. Pt and her mother overall decided to purchase a manual wheelchair out of pocket at this time due to high need now, due to fall frequency. All questions and concerns answered/addressed, and pt will be discharged from OT at this time.    Trish Mage, OTR/L [facility] [phone], OT 12/05/2022, 9:46 AM  [facility] Outpatient Rehabilitation at [facility] [address] [place], [place], [address] Phone: [phone]   Fax:  [phone]  Name:  Marissa [name] MRN: [id number] Date of Birth: 11/20/1980

## 2022-12-23 ENCOUNTER — Other Ambulatory Visit: Payer: Self-pay | Admitting: Family Medicine

## 2022-12-25 ENCOUNTER — Other Ambulatory Visit: Payer: Self-pay | Admitting: Obstetrics and Gynecology

## 2022-12-25 ENCOUNTER — Encounter: Payer: Self-pay | Admitting: Orthopedic Surgery

## 2022-12-25 NOTE — Patient Instructions (Signed)
Hi Marissa [name], nice to speak with you this morning-have a nice day.  I hope your appointments go well.  Marissa [name] was given information about Medicaid Managed Care team care coordination services as a part of their Healthy [employer] Medicaid benefit. Marissa [name] verbally consented to engagement with the [facility] Managed Care team.   If you are experiencing a medical emergency, please call 911 or report to your local emergency department or urgent care.   If you have a non-emergency medical problem during routine business hours, please contact your provider's office and ask to speak with a nurse.   For questions related to your Healthy [facility] health plan, please call: [phone] or visit the homepage here: [email or web address]  If you would like to schedule transportation through your Healthy [facility] plan, please call the following number at least 2 days in advance of your appointment: [phone]  For information about your ride after you set it up, call Ride Assist at [phone]. Use this number to activate a Will Call pickup, or if your transportation is late for a scheduled pickup. Use this number, too, if you need to make a change or cancel a previously scheduled reservation.  If you need transportation services right away, call [phone]. The after-hours call center is staffed 24 hours to handle ride assistance and urgent reservation requests (including discharges) 365 days a year. Urgent trips include sick visits, hospital discharge requests and life-sustaining treatment.  Call the [facility] Line at [phone], at any time, 24 hours a day, 7 days a week. If you are in danger or need immediate medical attention call 911.  If you would like help to quit smoking, call 1-800-QUIT-NOW ((416)262-4917) OR Espaol: 1-855-Djelo-Ya (4-742-595-6387) o para ms informacin haga clic aqu or Text READY to 564-332 to register via  text  Ms. Marissa [name] - following are the goals we discussed in your visit today:   Goals Addressed    Timeframe:  Long-Range Goal Priority:  High Start Date:  06/18/22                           Expected End Date:    ongoing                   Follow Up Date: 01/26/23   - practice safe sex - schedule appointment for vaccines needed due to my age or health - schedule recommended health tests (blood work, mammogram, colonoscopy, pap test) - schedule and keep appointment for annual check-up    Why is this important?   Screening tests can find diseases early when they are easier to treat.  Your doctor or nurse will talk with you about which tests are important for you.  Getting shots for common diseases like the flu and shingles will help prevent them.  12/25/22? Surg 12/13.  ST 12/24.  Patient verbalizes understanding of instructions and care plan provided today and agrees to view in MyChart. Active MyChart status and patient understanding of how to access instructions and care plan via MyChart confirmed with patient.     The Managed Medicaid care management team will reach out to the patient again over the next 30 business  days.  The  Patient has been provided with contact information for the Managed Medicaid care management team and has been advised to call with any health related questions or concerns.   [name] RN, BSN [place]  [employer] Care Management  Coordinator - Managed Medicaid High Risk [phone]   Following is a copy of your plan of care:  Care Plan : RN Care Manager Plan of Care  Updates made by [name], RN since 12/25/2022 12:00 AM     Problem: Health Promotion or Disease Self-Management (General Plan of Care)      Long-Range Goal: Chronic Disease Management and Care Coordination Needs   Start Date: 06/18/2022  Expected End Date: 02/25/2023  Priority: High  Note:   Current Barriers:  Knowledge Deficits related to plan of care for  management of migraines, rhinitis, back pain, h/o CVA Care Coordination needs related to NEURO appointment-completed. Chronic Disease Management support and education needs related to migraines, rhinitis, back pain, h/o CVA 12/25/22: Possible surgery 12/13 for herniated disc.  Increase in headaches during  last month.  Has ST 12/24 and will schedule CBT this month.  W/C on hold.    RNCM Clinical Goal(s):  Patient will verbalize understanding of plan for management of migraines, rhinitis, back pain, h/o CVA as evidenced by patient report. verbalize basic understanding of migraines, rhinitis, back pain, h/o CVA disease process and self health management plan as evidenced by patient report. take all medications exactly as prescribed and will call provider for medication related questions as evidenced by patient report. demonstrate understanding of rationale for each prescribed medication as evidenced by patient report attend all scheduled medical appointments as evidenced by patient report and EMR review Demonstrate ongoing  adherence to prescribed treatment plan for migraines, rhinitis, back pain, h/o CVA as evidenced by patient report and EMR review continue to work with RN Care Manager to address care management and care coordination needs related to migraines, rhinitis, back pain, h/o CVA as evidenced by adherence to CM Team Scheduled appointments through collaboration with RN Care manager, provider, and care team.   Interventions: Inter-disciplinary care team collaboration (see longitudinal plan of care) Evaluation of current treatment plan related to  self management and patient's adherence to plan as established by provider   (Status:  New goal.)  Long Term Goal Evaluation of current treatment plan related to migraines, rhinitis, back pain, h/o CVA self-management and patient's adherence to plan as established by provider. Discussed plans with patient for ongoing care management follow up and  provided patient with direct contact information for care management team Reviewed medications with patient Collaborated with PCP  regarding appointment Reviewed scheduled/upcoming provider appointments Discussed plans with patient for ongoing care management follow up and provided patient with direct contact information for care management team Assessed social determinant of health barriers 06/18/22:  Message sent to provider to request assistance with NEURO appt.-completed.  Patient Goals/Self-Care Activities: Take all medications as prescribed Attend all scheduled provider appointments Call pharmacy for medication refills 3-7 days in advance of running out of medications Perform all self care activities independently  Perform IADL's (shopping, preparing meals, housekeeping, managing finances) independently Call provider office for new concerns or questions  08/22/22:  patient will f/u regarding therapy appts.-completed  Follow Up Plan:  The patient has been provided with contact information for the care management team and has been advised to call with any health related questions or concerns.  The care management team will reach out to the patient again over the next 30 business  days.

## 2022-12-25 NOTE — Patient Outreach (Addendum)
Medicaid Managed Care   Nurse Care Manager Note  12/25/2022 Name:  Marissa [name] MRN:  [id number] DOB:  05/18/1980  Marissa [name] is an 42 y.o. year old female who is a primary patient of Kerri Perches, MD.  The [facility] Managed Care Coordination team was consulted for assistance with:    Chronic healthcare management needs, migraines, rhintis, chronic pain, h/o CVA, functional movement disorder, herniated disc, right arm tremor, non epileptic seizures  Marissa [name] was given information about Medicaid Managed Care Coordination team services today. Marissa [name] Patient agreed to services and verbal consent obtained.  Engaged with patient by telephone for follow up visit in response to provider referral for case management and/or care coordination services.   Assessments/Interventions:  Review of past medical history, allergies, medications, health status, including review of consultants reports, laboratory and other test data, was performed as part of comprehensive evaluation and provision of chronic care management services.  SDOH (Social Determinants of Health) assessments and interventions performed: SDOH Interventions    Flowsheet Row Patient Outreach Telephone from 12/25/2022 in [place] POPULATION HEALTH DEPARTMENT Patient Outreach Telephone from 11/25/2022 in [place] POPULATION HEALTH DEPARTMENT Patient Outreach Telephone from 09/25/2022 in [place] POPULATION HEALTH DEPARTMENT Patient Outreach Telephone from 08/22/2022 in [place] POPULATION HEALTH DEPARTMENT Patient Outreach Telephone from 07/21/2022 in [place] POPULATION HEALTH DEPARTMENT Patient Outreach Telephone from 06/18/2022 in [place] POPULATION HEALTH DEPARTMENT  SDOH Interventions        Food Insecurity Interventions -- Intervention Not Indicated -- -- -- Intervention Not Indicated  Housing Interventions Intervention Not Indicated -- -- -- -- Intervention Not Indicated  Transportation Interventions  -- -- -- -- -- Intervention Not Indicated  Utilities Interventions -- -- -- -- -- Intervention Not Indicated  Alcohol Usage Interventions -- -- -- -- Intervention Not Indicated (Score <7) --  Financial Strain Interventions -- -- -- Intervention Not Indicated -- --  Physical Activity Interventions -- -- Intervention Not Indicated, Other (Comments)  [patient not physically able to engage in this type of exercise] -- -- --  Social Connections Interventions -- Intervention Not Indicated -- -- -- --  Health Literacy Interventions -- -- -- Intervention Not Indicated -- --     Care Plan Allergies  Allergen Reactions   Triptans     Cannot use triptans for migraine per neurology due to stroke history   Morphine And Codeine Hives    Feels like skin is burning     Medications Reviewed Today     Reviewed by [name], RN (Registered Nurse) on 12/25/22 at 1050  Med List Status: <None>   Medication Order Taking? Sig Documenting Provider Last Dose Status Informant  butalbital-acetaminophen-caffeine (FIORICET) 50-325-40 MG tablet [id number]  Take one tablet by mouth twice daily, as needed, for headache. Maximum is twice per week Kerri Perches, MD  Active   ergocalciferol (VITAMIN D2) 1.25 MG (50000 UT) capsule [id number] No Take 1 capsule (50,000 Units total) by mouth once a week. One capsule once weekly Kerri Perches, MD Taking Active   gabapentin (NEURONTIN) 300 MG capsule [id number] No Take 1 capsule (300 mg total) by mouth 3 (three) times daily. 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   Acute back pain with radiculopathy 07/06/2022  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   H/O Amplatzer atrial septal defect closure 03/25/2022   Depression, major, single episode,  moderate (HCC) 03/16/2022   Allergic rhinitis 03/16/2022   Abnormal EKG 03/03/2022   Encounter for examination following treatment at hospital 03/03/2022   Abnormal MRA, brain 10/25/2021   Back pain with right-sided radiculopathy 12/12/2020   Neurological disorder 09/07/2020   Speech disturbance 09/03/2020   Spell of abnormal behavior    Abnormal MRI of head    TIA (transient ischemic attack) 08/13/2020   History of CVA (cerebrovascular accident) 12/09/2018   Anxiety 12/09/2018   Obesity (BMI 30.0-34.9) 12/09/2018   Common migraine with intractable migraine 11/08/2015   PFO (patent foramen ovale) 10/08/2015   Vitamin D deficiency 10/08/2015   Conditions to be addressed/monitored per PCP order:  Chronic healthcare management needs, migraines, rhintis, chronic pain, h/o CVA, functional movement disorder, herniated disc, right arm tremor, non epileptic seizures  Care Plan : RN Care Manager Plan of Care  Updates made by [name], RN since 12/25/2022 12:00 AM     Problem: Health Promotion or Disease Self-Management (General Plan of Care)      Long-Range Goal: Chronic Disease Management and Care Coordination Needs   Start Date: 06/18/2022  Expected End Date: 02/25/2023  Priority: High  Note:   Current Barriers:  Knowledge Deficits related to plan of care for management of migraines, rhinitis, back pain, h/o CVA Care Coordination needs related to NEURO appointment-completed. Chronic Disease Management support and education needs related to migraines, rhinitis, back pain, h/o CVA 12/25/22: Possible surgery 12/13 for herniated disc.  Increase in headaches during  last month.  Has ST 12/24 and will schedule CBT this month.  W/C on hold.    RNCM Clinical Goal(s):  Patient will verbalize understanding of plan for management of migraines, rhinitis, back pain, h/o CVA as evidenced by patient report. verbalize basic understanding of migraines, rhinitis, back pain, h/o CVA disease process  and self health management plan as evidenced by patient report. take all medications exactly as prescribed and will call provider for medication related questions as evidenced by patient report. demonstrate understanding of rationale for each prescribed medication as evidenced by patient report attend all scheduled medical appointments as evidenced by patient report and EMR review Demonstrate ongoing  adherence to prescribed treatment plan for migraines, rhinitis, back pain, h/o CVA as evidenced by patient report and EMR review continue to work with RN Care Manager to address care management and care coordination needs related to migraines, rhinitis, back pain, h/o CVA as evidenced by adherence to CM Team Scheduled appointments through collaboration with RN Care manager, provider, and care team.   Interventions: Inter-disciplinary care team collaboration (see longitudinal plan of care) Evaluation of current treatment plan related to  self management and patient's adherence to plan as established by provider   (Status:  New goal.)  Long Term Goal Evaluation of current treatment plan related to migraines, rhinitis, back pain, h/o CVA self-management and patient's adherence to plan as established by provider. Discussed plans with patient for ongoing care management follow up and provided patient with direct contact information for care management team Reviewed medications with patient Collaborated with PCP  regarding appointment Reviewed scheduled/upcoming provider appointments Discussed plans with patient for ongoing care management follow up and provided patient with direct contact information for care management team Assessed social determinant of health barriers 06/18/22:  Message sent to provider to request assistance with NEURO appt.-completed.  Patient Goals/Self-Care  Activities: Take all medications as prescribed Attend all scheduled provider appointments Call pharmacy for medication  refills 3-7 days in advance of running out of medications Perform all self care activities independently  Perform IADL's (shopping, preparing meals, housekeeping, managing finances) independently Call provider office for new concerns or questions  08/22/22:  patient will f/u regarding therapy appts.-completed  Follow Up Plan:  The patient has been provided with contact information for the care management team and has been advised to call with any health related questions or concerns.  The care management team will reach out to the patient again over the next 30 business  days.   Long-Range Goal: Establish Plan of care for Chronic Disease Management Needs and Care Coordination Needs   Priority: High  Note:   Timeframe:  Long-Range Goal Priority:  High Start Date:  06/18/22                           Expected End Date:    ongoing                   Follow Up Date: 01/26/23   - practice safe sex - schedule appointment for vaccines needed due to my age or health - schedule recommended health tests (blood work, mammogram, colonoscopy, pap test) - schedule and keep appointment for annual check-up    Why is this important?   Screening tests can find diseases early when they are easier to treat.  Your doctor or nurse will talk with you about which tests are important for you.  Getting shots for common diseases like the flu and shingles will help prevent them.  12/25/22? Surg 12/13.  ST 12/24.   Follow Up:  Patient agrees to Care Plan and Follow-up.  Plan: The Managed Medicaid care management team will reach out to the patient again over the next 30 business  days. and The  Patient has been provided with contact information for the Managed Medicaid care management team and has been advised to call with any health related questions or concerns.  Date/time of next scheduled RN care management/care coordination outreach:  01/26/23 at 315

## 2022-12-26 ENCOUNTER — Encounter: Payer: Self-pay | Admitting: Family Medicine

## 2022-12-26 ENCOUNTER — Ambulatory Visit: Payer: Self-pay | Admitting: Family Medicine

## 2022-12-26 ENCOUNTER — Ambulatory Visit: Payer: Medicaid Other | Admitting: Family Medicine

## 2022-12-26 ENCOUNTER — Ambulatory Visit (HOSPITAL_COMMUNITY)
Admission: RE | Admit: 2022-12-26 | Discharge: 2022-12-26 | Disposition: A | Discharge status: Home / Self Care | Payer: Medicaid Other | Source: Ambulatory Visit | Attending: Family Medicine | Admitting: Family Medicine

## 2022-12-26 VITALS — BP 121/82 | HR 80 | Ht 62.0 in | Wt 184.0 lb

## 2022-12-26 DIAGNOSIS — M542 Cervicalgia: Secondary | ICD-10-CM | POA: Diagnosis not present

## 2022-12-26 MED ORDER — KETOROLAC TROMETHAMINE 60 MG/2ML IM SOLN
60.0000 mg | Freq: Once | INTRAMUSCULAR | Status: AC
  Administered 2022-12-26: 60 mg via INTRAMUSCULAR

## 2022-12-26 MED ORDER — METHOCARBAMOL 750 MG PO TABS: 750.0000 mg | ORAL_TABLET | Freq: Three times a day (TID) | ORAL | 0 refills | Status: DC | PRN

## 2022-12-26 NOTE — Telephone Encounter (Signed)
  Chief Complaint: neck pain Symptoms: neck pain, left arm pain, upper back pain Frequency: constant Pertinent Negatives: Patient denies fever, inability to turn head, swelling, difficulty breathing Disposition: [] ED /[] Urgent Care (no appt availability in office) / [x] Appointment(In office/virtual)/ []  [facility]/ [] Home Care/ [] Refused Recommended Disposition /[] [place] [facility]/ []  Follow-up with PCP Additional Notes: Patient called in stating that she is experiencing severe neck pain that is radiating down her left arm and into her upper back. Patient reports she has neurology condition and is followed by neurology. Patient reports she experiences this pain, tingling in her arm, and headaches at baseline. Patient reports that the pain specifically has increased today compared to her baseline pain level. Patient is unsure if this is related to her ongoing neurology condition. Patient is specifically requesting to be seen in office today for evaluation of pain increase and possible pain management. Appt scheduled 12/6. This RN advised patient if symptoms worsen to call back for reevaluation. Patient verbalized understanding.    Copied from CRM [id number]. Topic: Clinical - Medical Advice >> Dec 26, 2022 11:11 AM Theodis Sato wrote: PT states she has severe neck pain radiating down her arms Reason for Disposition  [1] SEVERE neck pain (e.g., excruciating, unable to do any normal activities) AND [2] not improved after 2 hours of pain medicine  Answer Assessment - Initial Assessment Questions 1. ONSET: "When did the pain begin?"      The last few months but worse last night 2. LOCATION: "Where does it hurt?"      Both sides of neck down left arm and into upper back 3. PATTERN "Does the pain come and go, or has it been constant since it started?"      constant 4. SEVERITY: "How bad is the pain?"  (Scale 1-10; or mild, moderate, severe)   - NO PAIN (0): no pain or only slight  stiffness    - MILD (1-3): doesn't interfere with normal activities    - MODERATE (4-7): interferes with normal activities or awakens from sleep    - SEVERE (8-10):  excruciating pain, unable to do any normal activities      severe 5. RADIATION: "Does the pain go anywhere else, shoot into your arms?"     Down left arm, and upper back 6. CORD SYMPTOMS: "Any weakness or numbness of the arms or legs?"     tingling in arm at baseline 7. CAUSE: "What do you think is causing the neck pain?"     I have neurology problems so pain is constant but this is worse than normal 8. NECK OVERUSE: "Any recent activities that involved turning or twisting the neck?"     no 9. OTHER SYMPTOMS: "Do you have any other symptoms?" (e.g., headache, fever, chest pain, difficulty breathing, neck swelling)     Headaches at baseline  Protocols used: Neck Pain or Stiffness-A-AH

## 2022-12-26 NOTE — Telephone Encounter (Signed)
Pt scheduled for ov today.

## 2022-12-26 NOTE — Patient Instructions (Signed)

## 2022-12-26 NOTE — Assessment & Plan Note (Addendum)
Advise patient to visit ED due the severity of symptoms Neck Xray ordered. Toradol 60 mg IM injection given today Can take Robaxin 750 mg PRN I explained to the patient that non-pharmacological interventions include the application of ice or heat, rest, and recommended range of motion exercises along with gentle stretching.The patient was instructed to follow up if symptoms worsen or persist. The patient verbalized understanding of the care plan, and all questions were answered.

## 2022-12-26 NOTE — Progress Notes (Signed)
Established Patient Office Visit   Subjective  Patient ID: Marissa [name], female    DOB: 03-24-1980  Age: 42 y.o. MRN: [id number]  Chief Complaint  Patient presents with   Acute Visit    Severe Neck Pain radiating down shoulders, down into back making arms tingle pain in left arm. .     She  has a past medical history of Allergy, Anxiety, Back pain with right-sided radiculopathy (12/12/2020), Cerebrovascular accident (CVA) due to embolism (HCC) (10/08/2015), Common migraine with intractable migraine (11/08/2015), Encounter for examination following treatment at hospital (03/03/2022), Encounter for gynecological examination with Papanicolaou smear of cervix (12/30/2018), Hyperlipidemia, Stroke (HCC), and Vitamin D deficiency.  Patient reports neck pain that began last night and has been constant and unchanged since onset, with a severity of 8/10. The pain radiates to both arms and is accompanied by chest discomfort. She also experiences jaw pain with yawning, opening her mouth, and movement, which aggravates the discomfort. Additional symptoms include headaches, leg pain, numbness, photophobia, and visual changes. Notably, there is no pain associated with swallowing. She has tried heat therapy and neck support, including massaging her shoulders and back, but these measures have provided limited relief.     Review of Systems  Constitutional:  Negative for chills and fever.  Respiratory:  Negative for shortness of breath.   Musculoskeletal:  Positive for joint pain, myalgias and neck pain.      Objective:     BP 121/82   Pulse 80   Ht 5\' 2"  (1.575 m)   Wt 184 lb (83.5 kg)   SpO2 98%   BMI 33.65 kg/m  BP Readings from Last 3 Encounters:  12/26/22 121/82  10/07/22 129/85  08/25/22 137/87      Physical Exam Vitals reviewed.  Constitutional:      General: She is not in acute distress.    Appearance: Normal appearance. She is not ill-appearing, toxic-appearing or  diaphoretic.  HENT:     Head: Normocephalic.  Eyes:     General:        Right eye: No discharge.        Left eye: No discharge.     Conjunctiva/sclera: Conjunctivae normal.  Cardiovascular:     Rate and Rhythm: Normal rate.     Pulses: Normal pulses.     Heart sounds: Normal heart sounds.  Pulmonary:     Effort: Pulmonary effort is normal. No respiratory distress.     Breath sounds: Normal breath sounds.  Musculoskeletal:     Cervical back: Rigidity present. No edema or erythema. Pain with movement present. Decreased range of motion.  Skin:    General: Skin is warm and dry.     Capillary Refill: Capillary refill takes less than 2 seconds.  Neurological:     Mental Status: She is alert and oriented to person, place, and time.     Gait: Gait abnormal.  Psychiatric:        Mood and Affect: Mood normal.        Behavior: Behavior normal.      No results found for any visits on 12/26/22.  The ASCVD Risk score (Arnett DK, et al., 2019) failed to calculate for the following reasons:   The patient has a prior MI or stroke diagnosis    Assessment & Plan:  Neck pain -     DG Neck Soft Tissue; Future -     Methocarbamol; Take 1 tablet (750 mg total) by mouth every 8 (eight) hours  as needed for muscle spasms.  Dispense: 30 tablet; Refill: 0 -     Ketorolac Tromethamine  Acute neck pain Assessment & Plan: Advise patient to visit ED due the severity of symptoms Neck Xray ordered. Toradol 60 mg IM injection given today Can take Robaxin 750 mg PRN I explained to the patient that non-pharmacological interventions include the application of ice or heat, rest, and recommended range of motion exercises along with gentle stretching.The patient was instructed to follow up if symptoms worsen or persist. The patient verbalized understanding of the care plan, and all questions were answered.      Return if symptoms worsen or fail to improve.   Cruzita Lederer Newman Nip, FNP

## 2022-12-30 ENCOUNTER — Telehealth: Payer: Self-pay | Admitting: Orthopedic Surgery

## 2022-12-30 NOTE — Telephone Encounter (Signed)
Called pt to inform pt that Dr Christell Constant will be out of office 12/27. Pt also stated she needed to cancel her surgery. Pt stated she left voicemail for April B. Pt post op appt cancelled. Pt phone number is [phone].

## 2022-12-31 ENCOUNTER — Encounter: Payer: Self-pay | Admitting: Orthopedic Surgery

## 2022-12-31 ENCOUNTER — Inpatient Hospital Stay (HOSPITAL_COMMUNITY): Admission: RE | Admit: 2022-12-31 | Payer: Medicaid Other | Source: Ambulatory Visit

## 2023-01-01 ENCOUNTER — Encounter: Payer: Self-pay | Admitting: Family Medicine

## 2023-01-01 ENCOUNTER — Other Ambulatory Visit: Payer: Self-pay | Admitting: Family Medicine

## 2023-01-02 ENCOUNTER — Encounter (HOSPITAL_COMMUNITY): Admission: RE | Payer: Self-pay | Source: Home / Self Care

## 2023-01-02 ENCOUNTER — Ambulatory Visit (HOSPITAL_COMMUNITY): Admission: RE | Admit: 2023-01-02 | Payer: Medicaid Other | Source: Home / Self Care | Admitting: Orthopedic Surgery

## 2023-01-02 DIAGNOSIS — Z01818 Encounter for other preprocedural examination: Secondary | ICD-10-CM

## 2023-01-02 SURGERY — MICRODISCECTOMY LUMBAR LAMINECTOMY
Anesthesia: General | Laterality: Left

## 2023-01-06 MED ORDER — BUTALBITAL-APAP-CAFFEINE 50-325-40 MG PO TABS: ORAL_TABLET | ORAL | 3 refills | Status: DC

## 2023-01-09 ENCOUNTER — Ambulatory Visit: Payer: Self-pay | Admitting: Internal Medicine

## 2023-01-13 DIAGNOSIS — F985 Adult onset fluency disorder: Secondary | ICD-10-CM | POA: Diagnosis not present

## 2023-01-16 ENCOUNTER — Encounter: Payer: Medicaid Other | Admitting: Orthopedic Surgery

## 2023-01-26 ENCOUNTER — Other Ambulatory Visit: Payer: Self-pay | Admitting: Obstetrics and Gynecology

## 2023-01-26 NOTE — Patient Instructions (Signed)
 Hi [name], I am sorry I missed you today- as a part of your Medicaid benefit, you are eligible for care management and care coordination services at no cost or copay. I was unable to reach you by phone today but would be happy to help you with your health related needs. Please feel free to call me at [phone].  A member of the Managed Medicaid care management team will reach out to you again over the next 30 business days.   Veva Angles RN, BSN [place]  [employer] - Managed Medicaid High Risk [phone]

## 2023-01-26 NOTE — Patient Outreach (Signed)
  Medicaid Managed Care   Unsuccessful Attempt Note   01/26/2023 Name: Marissa [name] MRN: [id number] DOB: [birth year]  Referred by: [name], MD Reason for referral : High Risk Managed Medicaid (Unsuccessful telephone outreach)  An unsuccessful telephone outreach was attempted today. The patient was referred to the case management team for assistance with care management and care coordination.    Follow Up Plan: The Managed Medicaid care management team will reach out to the patient again over the next 30 business  days. and The  Patient has been provided with contact information for the Managed Medicaid care management team and has been advised to call with any health related questions or concerns.   Veva Angles RN, BSN [place]  [employer] - Managed Medicaid High Risk [phone]

## 2023-02-02 ENCOUNTER — Other Ambulatory Visit: Payer: Self-pay | Admitting: Obstetrics and Gynecology

## 2023-02-02 NOTE — Patient Instructions (Signed)
 Hi Marissa [name], thank you for speaking with me this afternoon and providing updates-I hope you have a nice afternoon!  Marissa [name] was given information about Medicaid Managed Care team care coordination services as a part of their [employer] Medicaid benefit. Marissa [name] verbally consented to engagement with the [facility] Managed Care team.   If you are experiencing a medical emergency, please call 911 or report to your local emergency department or urgent care.   If you have a non-emergency medical problem during routine business hours, please contact your provider's office and ask to speak with a nurse.   For questions related to your Healthy [facility] health plan, please call: [phone] or visit the homepage here: [email or web address]  If you would like to schedule transportation through your Healthy [facility] plan, please call the following number at least 2 days in advance of your appointment: [phone]  For information about your ride after you set it up, call Ride Assist at [phone]. Use this number to activate a Will Call pickup, or if your transportation is late for a scheduled pickup. Use this number, too, if you need to make a change or cancel a previously scheduled reservation.  If you need transportation services right away, call [phone]. The after-hours call center is staffed 24 hours to handle ride assistance and urgent reservation requests (including discharges) 365 days a year. Urgent trips include sick visits, hospital discharge requests and life-sustaining treatment.  Call the [facility] Line at [phone], at any time, 24 hours a day, 7 days a week. If you are in danger or need immediate medical attention call 911.  If you would like help to quit smoking, call 1-800-QUIT-NOW (917 607 4867) OR Espaol: 1-855-Djelo-Ya (8-144-664-6430) o para ms informacin haga clic aqu or Text READY to 799-599 to  register via text  Marissa [name] - following are the goals we discussed in your visit today:   Goals Addressed    Timeframe:  Long-Range Goal Priority:  High Start Date:  06/18/22                           Expected End Date:    ongoing                   Follow Up Date: 03/19/23   - practice safe sex - schedule appointment for vaccines needed due to my age or health - schedule recommended health tests (blood work, mammogram, colonoscopy, pap test) - schedule and keep appointment for annual check-up    Why is this important?   Screening tests can find diseases early when they are easier to treat.  Your doctor or nurse will talk with you about which tests are important for you.  Getting shots for common diseases like the flu and shingles will help prevent them.  02/02/23: Attending ST, has PCP and appt at [facility] next month.  CBT in April  Patient verbalizes understanding of instructions and care plan provided today and agrees to view in MyChart. Active MyChart status and patient understanding of how to access instructions and care plan via MyChart confirmed with patient.     The Managed Medicaid care management team will reach out to the patient again over the next 45 business  days.  The  Patient   has been provided with contact information for the Managed Medicaid care management team and has been advised to call with any health related questions or concerns.   Marissa [name]  Therapist, [facility] RN Care Manager Direct Dial [phone] [phone]    Following is a copy of your plan of care:  Care Plan : RN Care Manager Plan of Care  Updates made by [name], RN since 02/02/2023 12:00 AM     Problem: Health Promotion or Disease Self-Management (General Plan of Care)      Long-Range Goal: Chronic Disease Management and Care Coordination Needs   Start Date: 06/18/2022  Expected End Date: 05/03/2023  Priority: High  Note:   Current Barriers:   Knowledge Deficits related to plan of care for management of migraines, rhinitis, back pain, h/o CVA Care Coordination needs related to NEURO appointment-completed. Chronic Disease Management support and education needs related to migraines, rhinitis, back pain, h/o CVA 02/02/23: Attending ST, CBT to occur in April.  Headaches better with Fioricet. Has f/u with PCP and [name] next month.  Neck, back, leg pain continue-symptoms seem top be aggravated by heat and when she is tired.  Patient to f/u on PCS.  RNCM Clinical Goal(s):  Patient will verbalize understanding of plan for management of migraines, rhinitis, back pain, h/o CVA as evidenced by patient report. verbalize basic understanding of migraines, rhinitis, back pain, h/o CVA disease process and self health management plan as evidenced by patient report. take all medications exactly as prescribed and will call provider for medication related questions as evidenced by patient report. demonstrate understanding of rationale for each prescribed medication as evidenced by patient report attend all scheduled medical appointments as evidenced by patient report and EMR review Demonstrate ongoing  adherence to prescribed treatment plan for migraines, rhinitis, back pain, h/o CVA as evidenced by patient report and EMR review continue to work with RN Care Manager to address care management and care coordination needs related to migraines, rhinitis, back pain, h/o CVA as evidenced by adherence to CM Team Scheduled appointments through collaboration with RN Care manager, provider, and care team.   Interventions: Inter-disciplinary care team collaboration (see longitudinal plan of care) Evaluation of current treatment plan related to  self management and patient's adherence to plan as established by provider   (Status:  New goal.)  Long Term Goal Evaluation of current treatment plan related to migraines, rhinitis, back pain, h/o CVA self-management and  patient's adherence to plan as established by provider. Discussed plans with patient for ongoing care management follow up and provided patient with direct contact information for care management team Reviewed medications with patient Collaborated with PCP  regarding appointment Reviewed scheduled/upcoming provider appointments Discussed plans with patient for ongoing care management follow up and provided patient with direct contact information for care management team Assessed social determinant of health barriers 06/18/22:  Message sent to provider to request assistance with NEURO appt.-completed.  Patient Goals/Self-Care Activities: Take all medications as prescribed Attend all scheduled provider appointments Call pharmacy for medication refills 3-7 days in advance of running out of medications Perform all self care activities independently  Perform IADL's (shopping, preparing meals, housekeeping, managing finances) independently Call provider office for new concerns or questions  08/22/22:  patient will f/u regarding therapy appts.-completed 02/02/23:  patient to follow up on PCS.  Follow Up Plan:  The patient has been provided with contact information for the care management team and has been advised to call with any health related questions or concerns.  The care management team will reach out to the patient again over the next 45 business  days.

## 2023-02-02 NOTE — Patient Outreach (Signed)
 Medicaid Managed Care   Nurse Care Manager Note  02/02/2023 Name:  Marissa [name] MRN:  [id number] DOB:  [birth year]  Marissa [name] is an 43 y.o. year old female who is a primary [name] of Marissa Rollene BRAVO, MD.  The [facility] Managed Care Coordination team was consulted for assistance with:    Chronic healthcare management needs, migraines, rhinitis, chronic pain, h/o CVA, functional movement disorder. Herniated disc, right arm tremor, non-epileptic seizures  Ms. [name] was given information about Medicaid Managed Care Coordination team services today. Marissa [name] agreed to services and verbal consent obtained.  Engaged with [name] by telephone for follow up visit in response to provider referral for case management and/or care coordination services.   [name] is participating in a Managed Medicaid Plan:  Yes  Assessments/Interventions:  Review of past medical history, allergies, medications, health status, including review of consultants reports, laboratory and other test data, was performed as part of comprehensive evaluation and provision of chronic care management services.  SDOH (Social Drivers of Health) assessments and interventions performed: SDOH Interventions    Flowsheet Row [name] Outreach Telephone from 02/02/2023 in [place] POPULATION HEALTH DEPARTMENT [name] Outreach Telephone from 12/25/2022 in [place] POPULATION HEALTH DEPARTMENT [name] Outreach Telephone from 11/25/2022 in [place] POPULATION HEALTH DEPARTMENT [name] Outreach Telephone from 09/25/2022 in [place] POPULATION HEALTH DEPARTMENT [name] Outreach Telephone from 08/22/2022 in [place] POPULATION HEALTH DEPARTMENT [name] Outreach Telephone from 07/21/2022 in [place] POPULATION HEALTH DEPARTMENT  SDOH Interventions        Food Insecurity Interventions -- -- Intervention Not Indicated -- -- --  Housing Interventions -- Intervention Not Indicated -- -- -- --  Transportation  Interventions Intervention Not Indicated -- -- -- -- --  Utilities Interventions Intervention Not Indicated -- -- -- -- --  Alcohol Usage Interventions -- -- -- -- -- Intervention Not Indicated (Score <7)  Financial Strain Interventions -- -- -- -- Intervention Not Indicated --  Physical Activity Interventions -- -- -- Intervention Not Indicated, Other (Comments)  [[name] not physically able to engage in this type of exercise] -- --  Social Connections Interventions -- -- Intervention Not Indicated -- -- --  Health Literacy Interventions -- -- -- -- Intervention Not Indicated --     Care Plan Allergies  Allergen Reactions   Triptans     Cannot use triptans for migraine per neurology due to stroke history   Morphine And Codeine Hives    Feels like skin is burning    Medications Reviewed Today     Reviewed by [name], RN (Registered Nurse) on 02/02/23 at 1423  Med List Status: <None>   Medication Order Taking? Sig Documenting Provider Last Dose Status Informant  acetaminophen  (TYLENOL ) 500 MG tablet [id number]  Take 1,000 mg by mouth every 6 (six) hours as needed for moderate pain (pain score 4-6). [provider]  Active Self  butalbital -acetaminophen -caffeine  (FIORICET) 50-325-40 MG tablet [id number]  Take one tablet by mouth twice daily, as needed, for headache. Maximum is twice per week Marissa Rollene BRAVO, MD  Active Self  butalbital -acetaminophen -caffeine  (FIORICET) 50-325-40 MG tablet [id number]  Take one tablet by mouth twice daily , as needed, for uncontrolled headache, maximum twice weekly Marissa Rollene BRAVO, MD  Active   ergocalciferol  (VITAMIN D2) 1.25 MG (50000 UT) capsule [id number]  Take 1 capsule (50,000 Units total) by mouth once a week. One capsule once weekly  [name] not taking: Reported on 12/26/2022   Marissa Rollene BRAVO, MD  Active Self  methocarbamol  (ROBAXIN -750) 750 MG tablet [id number]  Take 1 tablet (750 mg total) by mouth every 8 (eight) hours as  needed for muscle spasms. 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   Acute back pain with radiculopathy 07/06/2022   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   H/O Amplatzer atrial septal defect closure 03/25/2022   Depression, major, single episode, moderate (HCC) 03/16/2022   Allergic rhinitis 03/16/2022   Abnormal EKG 03/03/2022   Encounter for examination following treatment at hospital 03/03/2022   Abnormal MRA, brain 10/25/2021   Back pain with right-sided radiculopathy 12/12/2020   Neurological disorder 09/07/2020   Speech disturbance 09/03/2020   Spell of abnormal behavior    Abnormal MRI of head    TIA (transient ischemic attack) 08/13/2020   History of CVA (cerebrovascular accident) 12/09/2018   Anxiety 12/09/2018   Obesity (BMI 30.0-34.9) 12/09/2018   Common migraine with intractable migraine 11/08/2015   PFO (patent foramen ovale) 10/08/2015   Vitamin D  deficiency 10/08/2015   Conditions to be addressed/monitored per PCP order:  Chronic healthcare management needs, migraines, rhinitis, chronic pain, h/o CVA, functional movement disorder. Herniated disc, right arm tremor, non-epileptic seizures  Care Plan : RN Care Manager Plan of Care  Updates made by [name], RN since 02/02/2023 12:00 AM     Problem: Health Promotion or Disease Self-Management (General Plan of Care)      Long-Range Goal: Chronic Disease Management and Care Coordination Needs   Start Date: 06/18/2022  Expected End Date: 05/03/2023  Priority: High  Note:   Current Barriers:  Knowledge Deficits related to plan of care for management of migraines, rhinitis, back pain, h/o CVA Care Coordination needs related to NEURO appointment-completed. Chronic Disease  Management support and education needs related to migraines, rhinitis, back pain, h/o CVA 02/02/23: Attending ST, CBT to occur in April.  Headaches better with Fioricet. Has f/u with PCP and [name] next month.  Neck, back, leg pain continue-symptoms seem top be aggravated by heat and when she is tired.  [name] to f/u on PCS.  RNCM Clinical Goal(s):  [name] will verbalize understanding of plan for management of migraines, rhinitis, back pain, h/o CVA as evidenced by [name] report. verbalize basic understanding of migraines, rhinitis, back pain, h/o CVA disease process and self health management plan as evidenced by [name] report. take all medications exactly as prescribed and will call provider for medication related questions as evidenced by [name] report. demonstrate understanding of rationale for each prescribed medication as evidenced by [name] report attend all scheduled medical appointments as evidenced by [name] report and EMR review Demonstrate ongoing  adherence to prescribed treatment plan for migraines, rhinitis, back pain, h/o CVA as evidenced by [name] report and EMR review continue to work with RN Care Manager to address care management and care coordination needs related to migraines, rhinitis, back pain, h/o CVA as evidenced by adherence to CM Team Scheduled appointments through collaboration with RN Care manager, provider, and care team.   Interventions: Inter-disciplinary care team collaboration (see longitudinal plan of care) Evaluation of current treatment plan related to  self management and [name]'s adherence to plan as established by provider   (Status:  New goal.)  Long Term Goal Evaluation of current  treatment plan related to migraines, rhinitis, back pain, h/o CVA self-management and [name]'s adherence to plan as established by provider. Discussed plans with [name] for ongoing care management follow up and provided [name] with direct contact information for care  management team Reviewed medications with [name] Collaborated with PCP  regarding appointment Reviewed scheduled/upcoming provider appointments Discussed plans with [name] for ongoing care management follow up and provided [name] with direct contact information for care management team Assessed social determinant of health barriers 06/18/22:  Message sent to provider to request assistance with NEURO appt.-completed.  [name] Goals/Self-Care Activities: Take all medications as prescribed Attend all scheduled provider appointments Call pharmacy for medication refills 3-7 days in advance of running out of medications Perform all self care activities independently  Perform IADL's (shopping, preparing meals, housekeeping, managing finances) independently Call provider office for new concerns or questions  08/22/22:  [name] will f/u regarding therapy appts.-completed 02/02/23:  [name] to follow up on PCS.  Follow Up Plan:  The [name] has been provided with contact information for the care management team and has been advised to call with any health related questions or concerns.  The care management team will reach out to the [name] again over the next 45 business  days.   Long-Range Goal: Establish Plan of care for Chronic Disease Management Needs and Care Coordination Needs   Priority: High  Note:   Timeframe:  Long-Range Goal Priority:  High Start Date:  06/18/22                           Expected End Date:    ongoing                   Follow Up Date: 03/19/23   - practice safe sex - schedule appointment for vaccines needed due to my age or health - schedule recommended health tests (blood work, mammogram, colonoscopy, pap test) - schedule and keep appointment for annual check-up    Why is this important?   Screening tests can find diseases early when they are easier to treat.  Your doctor or nurse will talk with you about which tests are important for you.  Getting shots for  common diseases like the flu and shingles will help prevent them.  02/02/23: Attending ST, has PCP and appt at [facility] next month.  CBT in April   Follow Up:  [name] agrees to Care Plan and Follow-up.  Plan: The Managed Medicaid care management team will reach out to the [name] again over the next 45 business  days. and The  [name] has been provided with contact information for the Managed Medicaid care management team and has been advised to call with any health related questions or concerns.  Date/time of next scheduled RN care management/care coordination outreach: 03/19/23 at 1030.

## 2023-03-05 ENCOUNTER — Encounter: Payer: Self-pay | Admitting: Family Medicine

## 2023-03-05 ENCOUNTER — Ambulatory Visit: Payer: Medicaid Other | Admitting: Family Medicine

## 2023-03-05 VITALS — BP 127/82 | HR 96 | Ht 62.0 in | Wt 187.0 lb

## 2023-03-05 DIAGNOSIS — E559 Vitamin D deficiency, unspecified: Secondary | ICD-10-CM | POA: Diagnosis not present

## 2023-03-05 DIAGNOSIS — Z1231 Encounter for screening mammogram for malignant neoplasm of breast: Secondary | ICD-10-CM | POA: Diagnosis not present

## 2023-03-05 DIAGNOSIS — F447 Conversion disorder with mixed symptom presentation: Secondary | ICD-10-CM | POA: Diagnosis not present

## 2023-03-05 DIAGNOSIS — G43019 Migraine without aura, intractable, without status migrainosus: Secondary | ICD-10-CM | POA: Diagnosis not present

## 2023-03-05 DIAGNOSIS — E66811 Obesity, class 1: Secondary | ICD-10-CM | POA: Diagnosis not present

## 2023-03-05 NOTE — Patient Instructions (Addendum)
F/u in 6 months call if you need me sooner  Fasting cBC, lipid,  tSH, and vit d and cmp and eGFR to be drawn 3 to 5 days before next visit  I will reach out to your neurologist , letting them know of your concern re driving , as I have stated I am medically unable to reinstate driving privileges based on your diagnosis and will ask that Neurology determine this  Thanks for choosing [facility], we consider it a privelige to serve you.  Please schedule mammogram at checkout

## 2023-03-05 NOTE — Progress Notes (Signed)
   Marissa [name]     MRN: [id number]      DOB: August 08, 1980  Chief Complaint  Patient presents with   Follow-up    Follow up    HPI Marissa [name] is here for follow up and re-evaluation of chronic medical conditions, medication management and review of any available recent lab and radiology data.  Preventive health is updated, specifically  Cancer screening and Immunization.  Refuses immunization and mammogram will be scheduled Doing better overall, speech improved, she has had her disability approved which is contributing to Marissa [name] overall wellbeing Requests tearfully that her DL be reinstated , just to be able to get around locally to grocery store and school, less than an approximately 20 mile radius , relies on her Marissa [name]  modst of the time and has Marissa [name] who also helps, Marissa [name] has no license With her dx I am deferring this decision to Neurology and will send a letter to this effect ROS Denies recent fever or chills. Denies sinus pressure, nasal congestion, ear pain or sore throat. Denies chest congestion, productive cough or wheezing. Denies chest pains, palpitations and leg swelling Denies abdominal pain, nausea, vomiting,diarrhea or constipation.   Denies dysuria, frequency, hesitancy or incontinence. Denies joint pain, swelling and limitation in mobility. Denies headaches, seizures, numbness, or tingling. Denies skin break down or rash.   PE  BP 127/82 (BP Location: Right Arm, Patient Position: Sitting, Cuff Size: Large)   Pulse 96   Ht 5\' 2"  (1.575 m)   Wt 187 lb 0.6 oz (84.8 kg)   SpO2 98%   BMI 34.21 kg/m   Patient alert and oriented and in no cardiopulmonary distress.  HEENT: No facial asymmetry, EOMI,     Neck supple .  Chest: Clear to auscultation bilaterally.  CVS: S1, S2 no murmurs, no S3.Regular rate.  ABD: Soft non tender.   Ext: No edema  MS: Adequate ROM spine, shoulders, hips and knees.  Skin: Intact, no ulcerations or rash noted.  Psych: Good  eye contact, normal affect. Memory intact slightly anxious and at times tearful as discussed  CNS: CN 2-12 intact, power,  normal throughout.no focal deficits noted.   Assessment & Plan  Functional neurological symptom disorder with mixed symptoms Marked improvement since last visit Continue management  Determination re driving privileges to be determined bY neurology  Obesity (BMI 30.0-34.9)  Patient re-educated about  the importance of commitment to a  minimum of 150 minutes of exercise per week as able.  The importance of healthy food choices with portion control discussed, as well as eating regularly and within a 12 hour window most days. The need to choose "clean , green" food 50 to 75% of the time is discussed, as well as to make water the primary drink and set a goal of 64 ounces water daily.       03/05/2023   10:06 AM 12/26/2022    1:57 PM 10/07/2022    2:01 PM  Weight /BMI  Weight 187 lb 0.6 oz 184 lb 183 lb  Height 5\' 2"  (1.575 m) 5\' 2"  (1.575 m) 5\' 2"  (1.575 m)  BMI 34.21 kg/m2 33.65 kg/m2 33.47 kg/m2      Vitamin D deficiency Updated lab needed at/ before next visit.   Common migraine with intractable migraine Acute episode responds to fioricet , limited  amount prescribed for art most twice weekly use

## 2023-03-08 ENCOUNTER — Encounter: Payer: Self-pay | Admitting: Family Medicine

## 2023-03-08 NOTE — Assessment & Plan Note (Signed)
  Patient re-educated about  the importance of commitment to a  minimum of 150 minutes of exercise per week as able.  The importance of healthy food choices with portion control discussed, as well as eating regularly and within a 12 hour window most days. The need to choose "clean , green" food 50 to 75% of the time is discussed, as well as to make water the primary drink and set a goal of 64 ounces water daily.       03/05/2023   10:06 AM 12/26/2022    1:57 PM 10/07/2022    2:01 PM  Weight /BMI  Weight 187 lb 0.6 oz 184 lb 183 lb  Height 5\' 2"  (1.575 m) 5\' 2"  (1.575 m) 5\' 2"  (1.575 m)  BMI 34.21 kg/m2 33.65 kg/m2 33.47 kg/m2

## 2023-03-08 NOTE — Assessment & Plan Note (Signed)
 Updated lab needed at/ before next visit.

## 2023-03-08 NOTE — Assessment & Plan Note (Signed)
Acute episode responds to fioricet , limited  amount prescribed for art most twice weekly use

## 2023-03-08 NOTE — Assessment & Plan Note (Signed)
Marked improvement since last visit Continue management  Determination re driving privileges to be determined bY neurology

## 2023-03-10 NOTE — Telephone Encounter (Signed)
Note typed and faxed 

## 2023-03-19 ENCOUNTER — Other Ambulatory Visit: Payer: Self-pay | Admitting: Obstetrics and Gynecology

## 2023-03-19 NOTE — Patient Instructions (Signed)
 Visit Information  Ms. [name]  - as a part of your Medicaid benefit, you are eligible for care management and care coordination services at no cost or copay. I was unable to reach you by phone today but would be happy to help you with your health related needs. Please feel free to call me at [phone].  A member of the Managed Medicaid care management team will reach out to you again over the next 30 business days.   [name] RN, BSN, [employer] [facility] RN Care Manager Direct Dial [phone] [email or web address]

## 2023-03-19 NOTE — Patient Outreach (Signed)
  Medicaid Managed Care   Unsuccessful Attempt Note   03/19/2023 Name: Marissa [name] MRN: [id number] DOB: 1980/11/09  Referred by: Kerri Perches, MD Reason for referral : High Risk Managed Medicaid (Unsuccessful telephone outreach)  An unsuccessful telephone outreach was attempted today. The patient was referred to the case management team for assistance with care management and care coordination.    Follow Up Plan: The Managed Medicaid care management team will reach out to the patient again over the next 30 business  days. and The  Patient has been provided with contact information for the Managed Medicaid care management team and has been advised to call with any health related questions or concerns.   [name] RN, BSN, [employer] [facility] RN Care Manager Direct Dial [phone]/OZH [phone] Website: [email or web address]

## 2023-03-20 ENCOUNTER — Encounter (HOSPITAL_COMMUNITY): Payer: Self-pay

## 2023-03-20 ENCOUNTER — Ambulatory Visit (HOSPITAL_COMMUNITY)
Admission: RE | Admit: 2023-03-20 | Discharge: 2023-03-20 | Disposition: A | Discharge status: Home / Self Care | Payer: Medicaid Other | Source: Ambulatory Visit | Attending: Family Medicine | Admitting: Family Medicine

## 2023-03-20 DIAGNOSIS — Z1231 Encounter for screening mammogram for malignant neoplasm of breast: Secondary | ICD-10-CM | POA: Insufficient documentation

## 2023-03-24 ENCOUNTER — Other Ambulatory Visit (HOSPITAL_COMMUNITY): Payer: Self-pay | Admitting: Family Medicine

## 2023-03-24 ENCOUNTER — Encounter: Payer: Self-pay | Admitting: Family Medicine

## 2023-03-24 DIAGNOSIS — R928 Other abnormal and inconclusive findings on diagnostic imaging of breast: Secondary | ICD-10-CM

## 2023-03-30 ENCOUNTER — Other Ambulatory Visit: Payer: Self-pay | Admitting: Obstetrics and Gynecology

## 2023-03-30 NOTE — Patient Outreach (Signed)
 Medicaid Managed Care   Nurse Care Manager Note  03/30/2023 Name:  Marissa [name] MRN:  [id number] DOB:  06-14-1980  Marissa [name] is an 43 y.o. year old female who is a primary patient of Kerri Perches, MD.  The [facility] Managed Care Coordination team was consulted for assistance with:    Chronic healthcare management needs, migraines, rhinitis, , chronic pain, h/o CVA, functional movement disorder, non epileptic seizures , herniated disc.  Marissa [name] was given information about Medicaid Managed Care Coordination team services today. Marissa [name] Patient agreed to services and verbal consent obtained.  Engaged with patient by telephone for follow up visit in response to provider referral for case management and/or care coordination services.   Patient is participating in a Managed Medicaid Plan:  Yes  Assessments/Interventions:  Review of past medical history, allergies, medications, health status, including review of consultants reports, laboratory and other test data, was performed as part of comprehensive evaluation and provision of chronic care management services.  SDOH (Social Drivers of Health) assessments and interventions performed: SDOH Interventions    Flowsheet Row Patient Outreach Telephone from 03/30/2023 in [place] POPULATION HEALTH DEPARTMENT Patient Outreach Telephone from 02/02/2023 in [place] POPULATION HEALTH DEPARTMENT Patient Outreach Telephone from 12/25/2022 in [place] POPULATION HEALTH DEPARTMENT Patient Outreach Telephone from 11/25/2022 in [place] POPULATION HEALTH DEPARTMENT Patient Outreach Telephone from 09/25/2022 in  POPULATION HEALTH DEPARTMENT Patient Outreach Telephone from 08/22/2022 in  POPULATION HEALTH DEPARTMENT  SDOH Interventions        Food Insecurity Interventions -- -- -- Intervention Not Indicated -- --  Housing Interventions -- -- Intervention Not Indicated -- -- --  Transportation Interventions --  Intervention Not Indicated -- -- -- --  Utilities Interventions -- Intervention Not Indicated -- -- -- --  Alcohol Usage Interventions Intervention Not Indicated (Score <7) -- -- -- -- --  Financial Strain Interventions -- -- -- -- -- Intervention Not Indicated  Physical Activity Interventions -- -- -- -- Intervention Not Indicated, Other (Comments)  [patient not physically able to engage in this type of exercise] --  Stress Interventions Other (Comment)  [to start CBT in April] -- -- -- -- --  Social Connections Interventions -- -- -- Intervention Not Indicated -- --  Health Literacy Interventions -- -- -- -- -- Intervention Not Indicated     Care Plan Allergies  Allergen Reactions   Triptans     Cannot use triptans for migraine per neurology due to stroke history   Morphine And Codeine Hives    Feels like skin is burning    Medications Reviewed Today     Reviewed by [name], RN (Registered Nurse) on 03/30/23 at 1458  Med List Status: <None>   Medication Order Taking? Sig Documenting Provider Last Dose Status Informant  acetaminophen (TYLENOL) 500 MG tablet [id number] No Take 1,000 mg by mouth every 6 (six) hours as needed for moderate pain (pain score 4-6). [provider] Taking Active Self  butalbital-acetaminophen-caffeine (FIORICET) 50-325-40 MG tablet [id number] No Take one tablet by mouth twice daily , as needed, for uncontrolled headache, maximum twice weekly Kerri Perches, MD Taking Active   ergocalciferol (VITAMIN D2) 1.25 MG (50000 UT) capsule [id number] No Take 1 capsule (50,000 Units total) by mouth once a week. One capsule once weekly Kerri Perches, MD Taking Active Self  methocarbamol (ROBAXIN-750) 750 MG tablet [id number] No Take 1 tablet (750 mg total) by mouth every 8 (eight) hours as  needed for muscle spasms. Del Nigel Berthold, FNP Taking Active            Patient Active Problem List   Diagnosis Date Noted   Acute neck pain  12/26/2022   Somatoform disorder, 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   Acute back pain with radiculopathy 07/06/2022   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   H/O Amplatzer atrial septal defect closure 03/25/2022   Depression, major, single episode, moderate (HCC) 03/16/2022   Allergic rhinitis 03/16/2022   Abnormal EKG 03/03/2022   Encounter for examination following treatment at hospital 03/03/2022   Abnormal MRA, brain 10/25/2021   Back pain with right-sided radiculopathy 12/12/2020   Neurological disorder 09/07/2020   Speech disturbance 09/03/2020   Spell of abnormal behavior    Abnormal MRI of head    TIA (transient ischemic attack) 08/13/2020   History of CVA (cerebrovascular accident) 12/09/2018   Anxiety 12/09/2018   Obesity (BMI 30.0-34.9) 12/09/2018   Common migraine with intractable migraine 11/08/2015   PFO (patent foramen ovale) 10/08/2015   Vitamin D deficiency 10/08/2015   Conditions to be addressed/monitored per PCP order:  Chronic healthcare management needs, migraines, rhinitis, , chronic pain, h/o CVA, functional movement disorder, non epileptic seizures  Care Plan : RN Care Manager Plan of Care  Updates made by [name], RN since 03/30/2023 12:00 AM     Problem: Health Promotion or Disease Self-Management (General Plan of Care)      Long-Range Goal: Chronic Disease Management and Care Coordination Needs   Start Date: 06/18/2022  Expected End Date: 06/30/2023  Priority: High  Note:   Current Barriers:  Knowledge Deficits related to plan of care for management of migraines, rhinitis, back pain, h/o CVA Care Coordination needs related to NEURO appointment-completed. Chronic Disease Management support and education needs related to migraines, rhinitis, back pain, h/o CVA 03/30/23:  patient still waiting to hear about PCS.  States she feels  like ST is helping and has CBT  next month.  States triggers are heat, stress, and fatigue.  Headaches contolled.  To have diagnostic MMG and breast u/s  3/27-? Mass on MMG.  RNCM Clinical Goal(s):  Patient will verbalize understanding of plan for management of migraines, rhinitis, back pain, h/o CVA as evidenced by patient report. verbalize basic understanding of migraines, rhinitis, back pain, h/o CVA disease process and self health management plan as evidenced by patient report. take all medications exactly as prescribed and will call provider for medication related questions as evidenced by patient report. demonstrate understanding of rationale for each prescribed medication as evidenced by patient report attend all scheduled medical appointments as evidenced by patient report and EMR review Demonstrate ongoing  adherence to prescribed treatment plan for migraines, rhinitis, back pain, h/o CVA as evidenced by patient report and EMR review continue to work with RN Care Manager to address care management and care coordination needs related to migraines, rhinitis, back pain, h/o CVA as evidenced by adherence to CM Team Scheduled appointments through collaboration with RN Care manager, provider, and care team.   Interventions: Inter-disciplinary care team collaboration (see longitudinal plan of care) Evaluation of current treatment plan related to  self management and patient's adherence to plan as established by provider   (Status:  New goal.)  Long Term Goal Evaluation of current treatment plan related to migraines, rhinitis, back pain, h/o CVA self-management and patient's adherence to plan as established by provider. Discussed plans with  patient for ongoing care management follow up and provided patient with direct contact information for care management team Reviewed medications with patient Collaborated with PCP  regarding appointment Reviewed scheduled/upcoming provider appointments Discussed plans with patient for  ongoing care management follow up and provided patient with direct contact information for care management team Assessed social determinant of health barriers  Patient Goals/Self-Care Activities: Take all medications as prescribed Attend all scheduled provider appointments Call pharmacy for medication refills 3-7 days in advance of running out of medications Perform all self care activities independently  Perform IADL's (shopping, preparing meals, housekeeping, managing finances) independently Call provider office for new concerns or questions   Follow Up Plan:  The patient has been provided with contact information for the care management team and has been advised to call with any health related questions or concerns.  The care management team will reach out to the patient again over the next 45 business  days.   Long-Range Goal: Establish Plan of care for Chronic Disease Management Needs and Care Coordination Needs   Priority: High  Note:   Timeframe:  Long-Range Goal Priority:  High Start Date:  06/18/22                           Expected End Date:    ongoing                   Follow Up Date: 05/20/23   - practice safe sex - schedule appointment for vaccines needed due to my age or health - schedule recommended health tests (blood work, mammogram, colonoscopy, pap test) - schedule and keep appointment for annual check-up    Why is this important?   Screening tests can find diseases early when they are easier to treat.  Your doctor or nurse will talk with you about which tests are important for you.  Getting shots for common diseases like the flu and shingles will help prevent them.  03/30/23: Continues SP, CBT next month. Recent  PCP and NEURO appts.   Follow Up:  Patient agrees to Care Plan and Follow-up.  Plan: The Managed Medicaid care management team will reach out to the patient again over the next 60 business  days. and The  Patient has been provided with contact  information for the Managed Medicaid care management team and has been advised to call with any health related questions or concerns.  Date/time of next scheduled RN care management/care coordination outreach: 05/20/23 at 1.

## 2023-03-30 NOTE — Patient Instructions (Signed)
 Visit Information  Ms. Noy was given information about Medicaid Managed Care team care coordination services as a part of their [employer] Medicaid benefit. [name] verbally consented to engagement with the [facility] Managed Care team.   If you are experiencing a medical emergency, please call 911 or report to your local emergency department or urgent care.   If you have a non-emergency medical problem during routine business hours, please contact your provider's office and ask to speak with a nurse.   For questions related to your Healthy [facility] health plan, please call: [phone] or visit the homepage here: [email or web address]  If you would like to schedule transportation through your Healthy [facility] plan, please call the following number at least 2 days in advance of your appointment: [phone]  For information about your ride after you set it up, call Ride Assist at [phone]. Use this number to activate a Will Call pickup, or if your transportation is late for a scheduled pickup. Use this number, too, if you need to make a change or cancel a previously scheduled reservation.  If you need transportation services right away, call [phone]. The after-hours call center is staffed 24 hours to handle ride assistance and urgent reservation requests (including discharges) 365 days a year. Urgent trips include sick visits, hospital discharge requests and life-sustaining treatment.  Call the [facility] Line at [phone], at any time, 24 hours a day, 7 days a week. If you are in danger or need immediate medical attention call 911.  If you would like help to quit smoking, call 1-800-QUIT-NOW (575-770-8802) OR Espaol: 1-855-Djelo-Ya (0-932-355-7322) o para ms informacin haga clic aqu or Text READY to 025-427 to register via text  Ms. [name] - following are the goals we discussed in your visit today:    Goals Addressed    Timeframe:  Long-Range Goal Priority:  High Start Date:  06/18/22                           Expected End Date:    ongoing                   Follow Up Date: 05/20/23   - practice safe sex - schedule appointment for vaccines needed due to my age or health - schedule recommended health tests (blood work, mammogram, colonoscopy, pap test) - schedule and keep appointment for annual check-up    Why is this important?   Screening tests can find diseases early when they are easier to treat.  Your doctor or nurse will talk with you about which tests are important for you.  Getting shots for common diseases like the flu and shingles will help prevent them.  03/30/23: Continues SP, CBT next month. Recent  PCP and NEURO appts.  Patient verbalizes understanding of instructions and care plan provided today and agrees to view in MyChart. Active MyChart status and patient understanding of how to access instructions and care plan via MyChart confirmed with patient.     The Managed Medicaid care management team will reach out to the patient again over the next 60 business  days.  The  Patient  has been provided with contact information for the Managed Medicaid care management team and has been advised to call with any health related questions or concerns.   [name] RNC, MNN, BSN [facility] RN Care Manager Direct Dial [phone] Website: [email or web address]   Following is  a copy of your plan of care:  Care Plan : RN Care Manager Plan of Care  Updates made by [name], RN since 03/30/2023 12:00 AM     Problem: Health Promotion or Disease Self-Management (General Plan of Care)      Long-Range Goal: Chronic Disease Management and Care Coordination Needs   Start Date: 06/18/2022  Expected End Date: 06/30/2023  Priority: High  Note:   Current Barriers:  Knowledge Deficits related to plan of care for management of migraines,  rhinitis, back pain, h/o CVA Care Coordination needs related to NEURO appointment-completed. Chronic Disease Management support and education needs related to migraines, rhinitis, back pain, h/o CVA 03/30/23:  patient still waiting to hear about PCS.  States she feels  like ST is helping and has CBT next month.  States triggers are heat, stress, and fatigue.  Headaches contolled.  To have diagnostic MMG and breast u/s  3/27-? Mass on MMG.  RNCM Clinical Goal(s):  Patient will verbalize understanding of plan for management of migraines, rhinitis, back pain, h/o CVA as evidenced by patient report. verbalize basic understanding of migraines, rhinitis, back pain, h/o CVA disease process and self health management plan as evidenced by patient report. take all medications exactly as prescribed and will call provider for medication related questions as evidenced by patient report. demonstrate understanding of rationale for each prescribed medication as evidenced by patient report attend all scheduled medical appointments as evidenced by patient report and EMR review Demonstrate ongoing  adherence to prescribed treatment plan for migraines, rhinitis, back pain, h/o CVA as evidenced by patient report and EMR review continue to work with RN Care Manager to address care management and care coordination needs related to migraines, rhinitis, back pain, h/o CVA as evidenced by adherence to CM Team Scheduled appointments through collaboration with RN Care manager, provider, and care team.   Interventions: Inter-disciplinary care team collaboration (see longitudinal plan of care) Evaluation of current treatment plan related to  self management and patient's adherence to plan as established by provider   (Status:  New goal.)  Long Term Goal Evaluation of current treatment plan related to migraines, rhinitis, back pain, h/o CVA self-management and patient's adherence to plan as established by provider. Discussed  plans with patient for ongoing care management follow up and provided patient with direct contact information for care management team Reviewed medications with patient Collaborated with PCP  regarding appointment Reviewed scheduled/upcoming provider appointments Discussed plans with patient for ongoing care management follow up and provided patient with direct contact information for care management team Assessed social determinant of health barriers  Patient Goals/Self-Care Activities: Take all medications as prescribed Attend all scheduled provider appointments Call pharmacy for medication refills 3-7 days in advance of running out of medications Perform all self care activities independently  Perform IADL's (shopping, preparing meals, housekeeping, managing finances) independently Call provider office for new concerns or questions   Follow Up Plan:  The patient has been provided with contact information for the care management team and has been advised to call with any health related questions or concerns.  The care management team will reach out to the patient again over the next 45 business  days.

## 2023-03-31 ENCOUNTER — Other Ambulatory Visit (HOSPITAL_COMMUNITY)
Admission: RE | Admit: 2023-03-31 | Discharge: 2023-03-31 | Disposition: A | Discharge status: Home / Self Care | Source: Ambulatory Visit | Attending: Family Medicine | Admitting: Family Medicine

## 2023-03-31 DIAGNOSIS — E559 Vitamin D deficiency, unspecified: Secondary | ICD-10-CM | POA: Diagnosis not present

## 2023-03-31 DIAGNOSIS — E66811 Obesity, class 1: Secondary | ICD-10-CM | POA: Insufficient documentation

## 2023-03-31 LAB — COMPREHENSIVE METABOLIC PANEL
ALT: 29 U/L (ref 0–44)
AST: 24 U/L (ref 15–41)
Albumin: 3.7 g/dL (ref 3.5–5.0)
Alkaline Phosphatase: 51 U/L (ref 38–126)
Anion gap: 9 (ref 5–15)
BUN: 12 mg/dL (ref 6–20)
CO2: 22 mmol/L (ref 22–32)
Calcium: 9.1 mg/dL (ref 8.9–10.3)
Chloride: 105 mmol/L (ref 98–111)
Creatinine, Ser: 0.72 mg/dL (ref 0.44–1.00)
GFR, Estimated: >60 mL/min (ref >60)
Glucose, Bld: 95 mg/dL (ref 70–99)
Potassium: 3.9 mmol/L (ref 3.5–5.1)
Sodium: 136 mmol/L (ref 135–145)
Total Bilirubin: 0.6 mg/dL (ref 0.0–1.2)
Total Protein: 7.2 g/dL (ref 6.5–8.1)

## 2023-03-31 LAB — LIPID PANEL
Cholesterol: 192 mg/dL (ref 0–200)
HDL: 67 mg/dL (ref >40)
LDL Cholesterol: 115 mg/dL — ABNORMAL HIGH (ref 0–99)
Total CHOL/HDL Ratio: 2.9 ratio
Triglycerides: 48 mg/dL (ref <150)
VLDL: 10 mg/dL (ref 0–40)

## 2023-03-31 LAB — TSH: TSH: 2.493 u[IU]/mL (ref 0.350–4.500)

## 2023-03-31 LAB — CBC
HCT: 38.2 % (ref 36.0–46.0)
Hemoglobin: 12.5 g/dL (ref 12.0–15.0)
MCH: 31 pg (ref 26.0–34.0)
MCHC: 32.7 g/dL (ref 30.0–36.0)
MCV: 94.8 fL (ref 80.0–100.0)
Platelets: 208 10*3/uL (ref 150–400)
RBC: 4.03 MIL/uL (ref 3.87–5.11)
RDW: 13 % (ref 11.5–15.5)
WBC: 4.7 10*3/uL (ref 4.0–10.5)
nRBC: 0 % (ref 0.0–0.2)

## 2023-03-31 LAB — VITAMIN D 25 HYDROXY (VIT D DEFICIENCY, FRACTURES): Vit D, 25-Hydroxy: 23.19 ng/mL — ABNORMAL LOW (ref 30–100)

## 2023-04-01 ENCOUNTER — Encounter: Payer: Self-pay | Admitting: Family Medicine

## 2023-04-01 MED ORDER — VITAMIN D (ERGOCALCIFEROL) 1.25 MG (50000 UNIT) PO CAPS: 50000.0000 [IU] | ORAL_CAPSULE | ORAL | 8 refills | Status: DC

## 2023-04-02 ENCOUNTER — Ambulatory Visit (HOSPITAL_COMMUNITY)
Admission: RE | Admit: 2023-04-02 | Discharge: 2023-04-02 | Disposition: A | Discharge status: Home / Self Care | Source: Ambulatory Visit | Attending: Family Medicine | Admitting: Family Medicine

## 2023-04-02 DIAGNOSIS — R92343 Mammographic extreme density, bilateral breasts: Secondary | ICD-10-CM | POA: Diagnosis not present

## 2023-04-02 DIAGNOSIS — R928 Other abnormal and inconclusive findings on diagnostic imaging of breast: Secondary | ICD-10-CM | POA: Insufficient documentation

## 2023-04-02 DIAGNOSIS — N6002 Solitary cyst of left breast: Secondary | ICD-10-CM | POA: Diagnosis not present

## 2023-04-05 ENCOUNTER — Encounter: Payer: Self-pay | Admitting: Family Medicine

## 2023-04-16 ENCOUNTER — Encounter (HOSPITAL_COMMUNITY)

## 2023-04-16 ENCOUNTER — Ambulatory Visit (HOSPITAL_COMMUNITY)

## 2023-05-12 ENCOUNTER — Other Ambulatory Visit: Payer: Self-pay | Admitting: Family Medicine

## 2023-05-13 DIAGNOSIS — F447 Conversion disorder with mixed symptom presentation: Secondary | ICD-10-CM | POA: Diagnosis not present

## 2023-05-18 ENCOUNTER — Other Ambulatory Visit (HOSPITAL_COMMUNITY): Payer: Self-pay | Admitting: Family Medicine

## 2023-05-18 DIAGNOSIS — N63 Unspecified lump in unspecified breast: Secondary | ICD-10-CM

## 2023-05-19 ENCOUNTER — Encounter: Payer: Self-pay | Admitting: Family Medicine

## 2023-05-20 ENCOUNTER — Other Ambulatory Visit: Payer: Self-pay

## 2023-05-20 ENCOUNTER — Other Ambulatory Visit: Payer: Self-pay | Admitting: *Deleted

## 2023-05-20 NOTE — Patient Outreach (Signed)
 Complex Care Management   Visit Note  05/20/2023  Name:  Marissa [name] MRN: [id number] DOB: 12-03-1980  Situation: Referral received for Complex Care Management related to Stroke I obtained verbal consent from Patient.  Visit completed with patient  on the phone  Background:   Past Medical History:  Diagnosis Date   Allergy    seasonal   Anxiety    Back pain with right-sided radiculopathy 12/12/2020   Cerebrovascular accident (CVA) due to embolism (HCC) 10/08/2015   R  frontal   Common migraine with intractable migraine 11/08/2015   Encounter for examination following treatment at hospital 03/03/2022   Encounter for gynecological examination with Papanicolaou smear of cervix 12/30/2018   Hyperlipidemia    Stroke (HCC)    Vitamin D  deficiency     Assessment: Patient Reported Symptoms:  Cognitive Cognitive Status: Alert and oriented to person, place, and time, Insightful and able to interpret abstract concepts, Normal speech and language skills Cognitive/Intellectual Conditions Management [RPT]: None reported or documented in medical history or problem list   Health Maintenance Behaviors: Annual physical exam Healing Pattern: Average Health Facilitated by: Rest  Neurological Neurological Review of Symptoms: Headaches Neurological Conditions: Headache Neurological Management Strategies: Medication therapy Neurological Self-Management Outcome: 4 (good)  HEENT HEENT Symptoms Reported: Not assessed      Cardiovascular Cardiovascular Symptoms Reported: No symptoms reported Does patient have uncontrolled Hypertension?: No Cardiovascular Self-Management Outcome: 4 (good)  Respiratory Respiratory Symptoms Reported: No symptoms reported Respiratory Self-Management Outcome: 4 (good)  Endocrine Patient reports the following symptoms related to hypoglycemia or hyperglycemia : No symptoms reported Is patient diabetic?: No Endocrine Self-Management Outcome: 4 (good)   Gastrointestinal Gastrointestinal Symptoms Reported: Not assessed   Nutrition Risk Screen (CP): No indicators present  Genitourinary Genitourinary Symptoms Reported: Not assessed Genitourinary Self-Management Outcome: 4 (good)  Integumentary Integumentary Symptoms Reported: Not assessed    Musculoskeletal Musculoskelatal Symptoms Reviewed: No symptoms reported Musculoskeletal Conditions: Back pain Musculoskeletal Management Strategies: Medication therapy Musculoskeletal Self-Management Outcome: 3 (uncertain) Falls in the past year?: No Number of falls in past year: 1 or less Was there an injury with Fall?: No Fall Risk Category Calculator: 0 Patient Fall Risk Level: Low Fall Risk Patient at Risk for Falls Due to: No Fall Risks  Psychosocial Psychosocial Symptoms Reported: No symptoms reported     Quality of Family Relationships: helpful, involved, supportive Do you feel physically threatened by others?: No      03/05/2023   10:07 AM  Depression screen PHQ 2/9  Decreased Interest 0  Down, Depressed, Hopeless 0  PHQ - 2 Score 0    There were no vitals filed for this visit.  Medications Reviewed Today     Reviewed by Remona Carmel, RN (Registered Nurse) on 05/20/23 at 1311  Med List Status: <None>   Medication Order Taking? Sig Documenting Provider Last Dose Status Informant  acetaminophen  (TYLENOL ) 500 MG tablet [id number] Yes Take 1,000 mg by mouth every 6 (six) hours as needed for moderate pain (pain score 4-6). [provider] Taking Active Self  butalbital -acetaminophen -caffeine  (FIORICET) 50-325-40 MG tablet [id number] No Take one tablet by mouth twice daily , as needed, for uncontrolled headache, maximum twice weekly Towanda Fret, MD Unknown Active   methocarbamol  (ROBAXIN -750) 750 MG tablet [id number] No Take 1 tablet (750 mg total) by mouth every 8 (eight) hours as needed for muscle spasms.  Patient not taking: Reported on 05/20/2023   Rosanna Comment, FNP Not Taking Consider Medication Status and Discontinue  Vitamin D , Ergocalciferol , (DRISDOL ) 1.25 MG (50000 UNIT) CAPS capsule [id number] Yes Take 1 capsule (50,000 Units total) by mouth every 7 (seven) days. Towanda Fret, MD Taking Active             Recommendation:   PCP Follow-up  Follow Up Plan:   Closing From:  Complex Care Management Patient has met all care management goals. Care Management case will be closed. Patient has been provided contact information should new needs arise.   [name], BSN RN [facility], [facility] Health RN Care Manager Direct Dial: [phone]  Fax: [phone]

## 2023-05-20 NOTE — Patient Instructions (Signed)
 Visit Information  Marissa [name] was given information about Medicaid Managed Care team care coordination services as a part of their Healthy [employer] Medicaid benefit. [name] verbally consented to engagement with the [facility] Managed Care team.   If you are experiencing a medical emergency, please call 911 or report to your local emergency department or urgent care.   If you have a non-emergency medical problem during routine business hours, please contact your provider's office and ask to speak with a nurse.   For questions related to your Healthy [facility] health plan, please call: [phone] or visit the homepage here: [email or web address]  If you would like to schedule transportation through your Healthy [facility] plan, please call the following number at least 2 days in advance of your appointment: [phone]  For information about your ride after you set it up, call Ride Assist at [phone]. Use this number to activate a Will Call pickup, or if your transportation is late for a scheduled pickup. Use this number, too, if you need to make a change or cancel a previously scheduled reservation.  If you need transportation services right away, call [phone]. The after-hours call center is staffed 24 hours to handle ride assistance and urgent reservation requests (including discharges) 365 days a year. Urgent trips include sick visits, hospital discharge requests and life-sustaining treatment.  Call the [facility] Line at [phone], at any time, 24 hours a day, 7 days a week. If you are in danger or need immediate medical attention call 911.  If you would like help to quit smoking, call 1-800-QUIT-NOW ((769)280-2416) OR Espaol: 1-855-Djelo-Ya (5-638-756-4332) o para ms informacin haga clic aqu or Text READY to 951-884 to register via text  [name] - following are the goals we discussed in your visit today:    Goals Addressed   None       Patient verbalizes understanding of instructions and care plan provided today and agrees to view in MyChart. Active MyChart status and patient understanding of how to access instructions and care plan via MyChart confirmed with patient.     No further follow up required: Does not meet criteria. Complex Care Manager RN signing off, patient aware and verbalized full understanding  [name], BSN RN [facility], [facility] RN Care Manager Direct Dial: [phone]  Fax: [phone]   Following is a copy of your plan of care:  There are no care plans that you recently modified to display for this patient.

## 2023-05-21 MED ORDER — BUTALBITAL-APAP-CAFFEINE 50-325-40 MG PO TABS: ORAL_TABLET | ORAL | 1 refills | Status: DC

## 2023-05-27 DIAGNOSIS — F447 Conversion disorder with mixed symptom presentation: Secondary | ICD-10-CM | POA: Diagnosis not present

## 2023-07-09 DIAGNOSIS — L239 Allergic contact dermatitis, unspecified cause: Secondary | ICD-10-CM | POA: Diagnosis not present

## 2023-07-14 ENCOUNTER — Encounter: Payer: Self-pay | Admitting: Family Medicine

## 2023-07-14 MED ORDER — VITAMIN D (ERGOCALCIFEROL) 1.25 MG (50000 UNIT) PO CAPS: 50000.0000 [IU] | ORAL_CAPSULE | ORAL | 1 refills | Status: DC

## 2023-07-14 MED ORDER — BUTALBITAL-APAP-CAFFEINE 50-325-40 MG PO TABS: ORAL_TABLET | ORAL | 3 refills | Status: DC

## 2023-08-10 ENCOUNTER — Encounter: Payer: Self-pay | Admitting: Family Medicine

## 2023-08-18 DIAGNOSIS — F447 Conversion disorder with mixed symptom presentation: Secondary | ICD-10-CM | POA: Diagnosis not present

## 2023-08-18 DIAGNOSIS — F4323 Adjustment disorder with mixed anxiety and depressed mood: Secondary | ICD-10-CM | POA: Diagnosis not present

## 2023-09-02 ENCOUNTER — Encounter: Payer: Self-pay | Admitting: Family Medicine

## 2023-09-02 ENCOUNTER — Ambulatory Visit (INDEPENDENT_AMBULATORY_CARE_PROVIDER_SITE_OTHER): Payer: Medicaid Other | Admitting: Family Medicine

## 2023-09-02 VITALS — BP 126/84 | HR 95 | Resp 16 | Ht 62.0 in | Wt 184.1 lb

## 2023-09-02 DIAGNOSIS — G43019 Migraine without aura, intractable, without status migrainosus: Secondary | ICD-10-CM

## 2023-09-02 DIAGNOSIS — E559 Vitamin D deficiency, unspecified: Secondary | ICD-10-CM

## 2023-09-02 DIAGNOSIS — F321 Major depressive disorder, single episode, moderate: Secondary | ICD-10-CM | POA: Diagnosis not present

## 2023-09-02 DIAGNOSIS — R4689 Other symptoms and signs involving appearance and behavior: Secondary | ICD-10-CM

## 2023-09-02 DIAGNOSIS — F447 Conversion disorder with mixed symptom presentation: Secondary | ICD-10-CM

## 2023-09-02 DIAGNOSIS — Z1329 Encounter for screening for other suspected endocrine disorder: Secondary | ICD-10-CM

## 2023-09-02 DIAGNOSIS — E66811 Obesity, class 1: Secondary | ICD-10-CM

## 2023-09-02 DIAGNOSIS — K2100 Gastro-esophageal reflux disease with esophagitis, without bleeding: Secondary | ICD-10-CM | POA: Diagnosis not present

## 2023-09-02 DIAGNOSIS — Z01419 Encounter for gynecological examination (general) (routine) without abnormal findings: Secondary | ICD-10-CM

## 2023-09-02 DIAGNOSIS — Z13220 Encounter for screening for lipoid disorders: Secondary | ICD-10-CM

## 2023-09-02 MED ORDER — TOPIRAMATE 25 MG PO TABS: 25.0000 mg | ORAL_TABLET | Freq: Two times a day (BID) | ORAL | 4 refills | Status: DC

## 2023-09-02 NOTE — Patient Instructions (Addendum)
 F/u in January  Fasting cBC, lipid, cmp and EGFR, TSH and vit d in January 3 to 7 days before appointment  New treduce headache frquency since you report 4 per weekitwice daily topamax   H pylori test today for reflux symptoms x 1 month. You will be started on specific medication for reflux once I get the result back   Continue CBT at [facility] are referred for pap due in December ( [facility])  Thanks for choosing [facility], we consider it a privelige to serve you.

## 2023-09-04 ENCOUNTER — Ambulatory Visit: Payer: Self-pay | Admitting: Family Medicine

## 2023-09-04 LAB — H. PYLORI BREATH TEST: H pylori Breath Test: NEGATIVE

## 2023-09-04 MED ORDER — PANTOPRAZOLE SODIUM 40 MG PO TBEC: 40.0000 mg | DELAYED_RELEASE_TABLET | Freq: Every day | ORAL | 3 refills | Status: AC

## 2023-09-30 ENCOUNTER — Encounter: Payer: Self-pay | Admitting: Family Medicine

## 2023-09-30 DIAGNOSIS — K2100 Gastro-esophageal reflux disease with esophagitis, without bleeding: Secondary | ICD-10-CM | POA: Insufficient documentation

## 2023-09-30 NOTE — Assessment & Plan Note (Signed)
 Start topamax  to reduce frrequncy, currently uncontrolled

## 2023-09-30 NOTE — Assessment & Plan Note (Signed)
 Updated lab needed at/ before next visit.

## 2023-09-30 NOTE — Assessment & Plan Note (Signed)
 Doing CBT at [facility] with overall fairly good resoinse , stiill incapable of working

## 2023-09-30 NOTE — Progress Notes (Signed)
   Marissa [name]     MRN: [id number]      DOB: 1980-10-29  Chief Complaint  Patient presents with   Medical Management of Chronic Issues    6 month follow up     HPI Marissa [name] is here for follow up and re-evaluation of chronic medical conditions, medication management and review of any available recent lab and radiology data.  Preventive health is updated, specifically  Cancer screening and Immunization.   Questions or concerns regarding consultations or procedures which the PT has had in the interim are  addressed. The PT denies any adverse reactions to current medications since the last visit.   C/o increased headache frequency up to 4 days per week Ongoing  spells of abnormal behavior ROS Denies recent fever or chills. Denies sinus pressure, nasal congestion, ear pain or sore throat. Denies chest congestion, productive cough or wheezing. Denies chest pains, palpitations and leg swelling Denies abdominal pain, nausea, vomiting,diarrhea or constipation.   Denies dysuria, frequency, hesitancy or incontinence. Denies joint pain, swelling and limitation in mobility. Denies skin break down or rash.   PE  BP 126/84   Pulse 95   Resp 16   Ht 5' 2 (1.575 m)   Wt 184 lb 1.9 oz (83.5 kg)   SpO2 98%   BMI 33.68 kg/m   Patient alert and oriented and in no cardiopulmonary distress.  HEENT: No facial asymmetry, EOMI,     Neck supple .  Chest: Clear to auscultation bilaterally.  CVS: S1, S2 no murmurs, no S3.Regular rate.  ABD: Soft non tender.   Ext: No edema  MS: Adequate ROM spine, shoulders, hips and knees.  Skin: Intact, no ulcerations or rash noted.  Psych: Good eye contact, normal affect. Memory intact not anxious or depressed appearing.  CNS: CN 2-12 intact, power,  normal throughout.no focal deficits noted.   Assessment & Plan  Common migraine with intractable migraine Start topamax  to reduce frrequncy, currently uncontrolled  Vitamin D   deficiency Updated lab needed at/ before next visit.   Spell of abnormal behavior Still having episodes but less frequently , doing CBT at [facility]  Functional neurological symptom disorder with mixed symptoms Doing CBT at [facility] with overall fairly good resoinse , stiill incapable of working  Gastroesophageal reflux disease with esophagitis without hemorrhage Symptomatic, H pylori test and start PPI as well as behavior modification  Obesity (BMI 30.0-34.9)  Patient re-educated about  the importance of commitment to a  minimum of 150 minutes of exercise per week as able.  The importance of healthy food choices with portion control discussed, as well as eating regularly and within a 12 hour window most days. The need to choose clean , green food 50 to 75% of the time is discussed, as well as to make water the primary drink and set a goal of 64 ounces water daily.       09/02/2023   10:08 AM 03/05/2023   10:06 AM 12/26/2022    1:57 PM  Weight /BMI  Weight 184 lb 1.9 oz 187 lb 0.6 oz 184 lb  Height 5' 2 (1.575 m) 5' 2 (1.575 m) 5' 2 (1.575 m)  BMI 33.68 kg/m2 34.21 kg/m2 33.65 kg/m2

## 2023-09-30 NOTE — Assessment & Plan Note (Signed)
  Patient re-educated about  the importance of commitment to a  minimum of 150 minutes of exercise per week as able.  The importance of healthy food choices with portion control discussed, as well as eating regularly and within a 12 hour window most days. The need to choose clean , green food 50 to 75% of the time is discussed, as well as to make water the primary drink and set a goal of 64 ounces water daily.       09/02/2023   10:08 AM 03/05/2023   10:06 AM 12/26/2022    1:57 PM  Weight /BMI  Weight 184 lb 1.9 oz 187 lb 0.6 oz 184 lb  Height 5' 2 (1.575 m) 5' 2 (1.575 m) 5' 2 (1.575 m)  BMI 33.68 kg/m2 34.21 kg/m2 33.65 kg/m2

## 2023-09-30 NOTE — Assessment & Plan Note (Signed)
 Still having episodes but less frequently , doing CBT at [facility]

## 2023-09-30 NOTE — Assessment & Plan Note (Signed)
 Symptomatic, H pylori test and start PPI as well as behavior modification

## 2023-10-05 ENCOUNTER — Encounter: Admitting: Advanced Practice Midwife

## 2023-10-06 ENCOUNTER — Ambulatory Visit (HOSPITAL_COMMUNITY)
Admission: RE | Admit: 2023-10-06 | Discharge: 2023-10-06 | Disposition: A | Discharge status: Home / Self Care | Source: Ambulatory Visit | Attending: Family Medicine | Admitting: Family Medicine

## 2023-10-06 ENCOUNTER — Ambulatory Visit: Payer: Self-pay

## 2023-10-06 ENCOUNTER — Encounter (HOSPITAL_COMMUNITY): Payer: Self-pay

## 2023-10-06 DIAGNOSIS — R928 Other abnormal and inconclusive findings on diagnostic imaging of breast: Secondary | ICD-10-CM | POA: Diagnosis not present

## 2023-10-06 DIAGNOSIS — N63 Unspecified lump in unspecified breast: Secondary | ICD-10-CM

## 2023-10-06 NOTE — Telephone Encounter (Signed)
 FYI Only or Action Required?: Action required by provider: request for appointment.  Patient was last seen in primary care on 09/02/2023 by [name], MD.  Called Nurse Triage reporting Foot Problem.  Symptoms began several weeks ago.  Interventions attempted: Rest, hydration, or home remedies.  Symptoms are: gradually worsening.  Triage Disposition: See PCP When Office is Open (Within 3 Days)  Patient/caregiver understands and will follow disposition?: Yes, will follow disposition  Copied from CRM [id number]. Topic: Clinical - Red Word Triage >> Oct 06, 2023  2:13 PM Rachelle R wrote: Red Word that prompted transfer to Nurse Triage: Patient states for about the last week has been having weakness, numbness, burning, tingling, achy in both feet. Reason for Disposition  [1] MILD weakness (e.g., does not interfere with ability to work, go to school, normal activities) AND [2] persists > 1 week  Numbness (i.e., loss of sensation) in foot or toes  (Exception: Just tingling; numbness present > 2 weeks.)  Answer Assessment - Initial Assessment Questions 1. DESCRIPTION: Describe how you are feeling.     Pain and burning in feet, both 2. SEVERITY: How bad is it?  Can you stand and walk?     Can still stand and walk, with difficulty 3. ONSET: When did these symptoms begin? (e.g., hours, days, weeks, months)     About a week ago 4. CAUSE: What do you think is causing the weakness or fatigue? (e.g., not drinking enough fluids, medical problem, trouble sleeping)     Unsure, states that she has had disc problems in the past 5. NEW MEDICINES:  Have you started on any new medicines recently? (e.g., opioid pain medicines, benzodiazepines, muscle relaxants, antidepressants, antihistamines, neuroleptics, beta blockers)     denies 6. OTHER SYMPTOMS: Do you have any other symptoms? (e.g., chest pain, fever, cough, SOB, vomiting, diarrhea, bleeding, other areas of pain)      denies 7. PREGNANCY: Is there any chance you are pregnant? When was your last menstrual period?     Denies  Offered appts at region clinics as there is no appt at pcp clinic. Pt would like to only be seen at [facility]. Please call back today with appt options.  Answer Assessment - Initial Assessment Questions 1. ONSET: When did the pain start?      Few weeks ago 2. LOCATION: Where is the pain located?      Bilateral feet 3. PAIN: How bad is the pain? Affects work 4. WORK OR EXERCISE: Has there been any recent work or exercise that involved this part of the body?      denies 5. CAUSE: What do you think is causing the foot pain?     Unsure, has hx of disc problems 6. OTHER SYMPTOMS: Do you have any other symptoms? (e.g., leg pain, rash, fever, numbness)     Numbness/tingling bilaterally 7. PREGNANCY: Is there any chance you are pregnant? When was your last menstrual period?     Denies  Denies DM.  Protocols used: Weakness (Generalized) and Fatigue-A-AH, Foot Pain-A-AH

## 2023-10-12 DIAGNOSIS — F4323 Adjustment disorder with mixed anxiety and depressed mood: Secondary | ICD-10-CM | POA: Diagnosis not present

## 2023-10-12 DIAGNOSIS — F447 Conversion disorder with mixed symptom presentation: Secondary | ICD-10-CM | POA: Diagnosis not present

## 2023-10-14 ENCOUNTER — Encounter: Payer: Self-pay | Admitting: Family Medicine

## 2023-10-20 ENCOUNTER — Ambulatory Visit

## 2023-10-21 ENCOUNTER — Ambulatory Visit

## 2023-10-21 ENCOUNTER — Ambulatory Visit (INDEPENDENT_AMBULATORY_CARE_PROVIDER_SITE_OTHER)

## 2023-10-21 DIAGNOSIS — E538 Deficiency of other specified B group vitamins: Secondary | ICD-10-CM | POA: Diagnosis not present

## 2023-10-21 MED ORDER — CYANOCOBALAMIN 1000 MCG/ML IJ SOLN
1000.0000 ug | Freq: Once | INTRAMUSCULAR | Status: AC
  Administered 2023-10-21: 1000 ug via INTRAMUSCULAR

## 2023-10-21 NOTE — Progress Notes (Signed)
 Patient is in office today for a nurse visit for B12 Injection. Patient Injection was given in the  Left deltoid. Patient tolerated injection well.

## 2023-10-28 ENCOUNTER — Ambulatory Visit

## 2023-11-16 ENCOUNTER — Encounter: Payer: Self-pay | Admitting: Internal Medicine

## 2023-11-16 ENCOUNTER — Emergency Department (HOSPITAL_COMMUNITY)
Admission: EM | Admit: 2023-11-16 | Discharge: 2023-11-16 | Disposition: A | Discharge status: Home / Self Care | Source: Ambulatory Visit | Attending: Emergency Medicine | Admitting: Emergency Medicine

## 2023-11-16 ENCOUNTER — Ambulatory Visit (INDEPENDENT_AMBULATORY_CARE_PROVIDER_SITE_OTHER): Admitting: Internal Medicine

## 2023-11-16 ENCOUNTER — Emergency Department (HOSPITAL_COMMUNITY)

## 2023-11-16 ENCOUNTER — Encounter (HOSPITAL_COMMUNITY): Payer: Self-pay

## 2023-11-16 ENCOUNTER — Other Ambulatory Visit: Payer: Self-pay

## 2023-11-16 VITALS — BP 125/77 | HR 110 | Ht 62.0 in | Wt 181.6 lb

## 2023-11-16 DIAGNOSIS — F449 Dissociative and conversion disorder, unspecified: Secondary | ICD-10-CM | POA: Diagnosis not present

## 2023-11-16 DIAGNOSIS — R531 Weakness: Secondary | ICD-10-CM | POA: Insufficient documentation

## 2023-11-16 DIAGNOSIS — F447 Conversion disorder with mixed symptom presentation: Secondary | ICD-10-CM | POA: Diagnosis not present

## 2023-11-16 DIAGNOSIS — F4489 Other dissociative and conversion disorders: Secondary | ICD-10-CM | POA: Diagnosis not present

## 2023-11-16 DIAGNOSIS — G43019 Migraine without aura, intractable, without status migrainosus: Secondary | ICD-10-CM

## 2023-11-16 DIAGNOSIS — R519 Headache, unspecified: Secondary | ICD-10-CM | POA: Diagnosis present

## 2023-11-16 DIAGNOSIS — Z8673 Personal history of transient ischemic attack (TIA), and cerebral infarction without residual deficits: Secondary | ICD-10-CM | POA: Diagnosis not present

## 2023-11-16 DIAGNOSIS — G43909 Migraine, unspecified, not intractable, without status migrainosus: Secondary | ICD-10-CM | POA: Diagnosis not present

## 2023-11-16 LAB — DIFFERENTIAL
Abs Immature Granulocytes: 0.03 K/uL (ref 0.00–0.07)
Basophils Absolute: 0 K/uL (ref 0.0–0.1)
Basophils Relative: 0 %
Eosinophils Absolute: 0.1 K/uL (ref 0.0–0.5)
Eosinophils Relative: 1 %
Immature Granulocytes: 0 %
Lymphocytes Relative: 24 %
Lymphs Abs: 2 K/uL (ref 0.7–4.0)
Monocytes Absolute: 0.6 K/uL (ref 0.1–1.0)
Monocytes Relative: 7 %
Neutro Abs: 5.6 K/uL (ref 1.7–7.7)
Neutrophils Relative %: 68 %

## 2023-11-16 LAB — APTT: aPTT: 28 s (ref 24–36)

## 2023-11-16 LAB — CBC
HCT: 39.9 % (ref 36.0–46.0)
Hemoglobin: 13.2 g/dL (ref 12.0–15.0)
MCH: 31.4 pg (ref 26.0–34.0)
MCHC: 33.1 g/dL (ref 30.0–36.0)
MCV: 94.8 fL (ref 80.0–100.0)
Platelets: 256 K/uL (ref 150–400)
RBC: 4.21 MIL/uL (ref 3.87–5.11)
RDW: 12.8 % (ref 11.5–15.5)
WBC: 8.2 K/uL (ref 4.0–10.5)
nRBC: 0 % (ref 0.0–0.2)

## 2023-11-16 LAB — COMPREHENSIVE METABOLIC PANEL WITH GFR
ALT: 22 U/L (ref 0–44)
AST: 23 U/L (ref 15–41)
Albumin: 4.3 g/dL (ref 3.5–5.0)
Alkaline Phosphatase: 82 U/L (ref 38–126)
Anion gap: 12 (ref 5–15)
BUN: 10 mg/dL (ref 6–20)
CO2: 24 mmol/L (ref 22–32)
Calcium: 9.3 mg/dL (ref 8.9–10.3)
Chloride: 102 mmol/L (ref 98–111)
Creatinine, Ser: 0.83 mg/dL (ref 0.44–1.00)
GFR, Estimated: >60 mL/min (ref >60)
Glucose, Bld: 92 mg/dL (ref 70–99)
Potassium: 3.8 mmol/L (ref 3.5–5.1)
Sodium: 138 mmol/L (ref 135–145)
Total Bilirubin: 0.5 mg/dL (ref 0.0–1.2)
Total Protein: 7.4 g/dL (ref 6.5–8.1)

## 2023-11-16 LAB — PROTIME-INR
INR: 0.9 (ref 0.8–1.2)
Prothrombin Time: 13.1 s (ref 11.4–15.2)

## 2023-11-16 LAB — ETHANOL: Alcohol, Ethyl (B): <15 mg/dL (ref <15)

## 2023-11-16 LAB — CBG MONITORING, ED: Glucose-Capillary: 72 mg/dL (ref 70–99)

## 2023-11-16 MED ORDER — KETOROLAC TROMETHAMINE 15 MG/ML IJ SOLN
15.0000 mg | Freq: Once | INTRAMUSCULAR | Status: AC
  Administered 2023-11-16: 15 mg via INTRAVENOUS
  Filled 2023-11-16: qty 1

## 2023-11-16 MED ORDER — PROCHLORPERAZINE EDISYLATE 10 MG/2ML IJ SOLN
10.0000 mg | Freq: Once | INTRAMUSCULAR | Status: AC
  Administered 2023-11-16: 10 mg via INTRAVENOUS
  Filled 2023-11-16: qty 2

## 2023-11-16 MED ORDER — DIPHENHYDRAMINE HCL 50 MG/ML IJ SOLN
12.5000 mg | Freq: Once | INTRAMUSCULAR | Status: AC
  Administered 2023-11-16: 12.5 mg via INTRAVENOUS
  Filled 2023-11-16: qty 1

## 2023-11-16 MED ORDER — IOHEXOL 350 MG/ML SOLN
75.0000 mL | Freq: Once | INTRAVENOUS | Status: AC | PRN
  Administered 2023-11-16: 75 mL via INTRAVENOUS

## 2023-11-16 MED ORDER — SODIUM CHLORIDE 0.9 % IV BOLUS
1000.0000 mL | Freq: Once | INTRAVENOUS | Status: AC
  Administered 2023-11-16: 1000 mL via INTRAVENOUS

## 2023-11-16 NOTE — Progress Notes (Signed)
 Acute Office Visit  Subjective:    Patient ID: Marissa [name], female    DOB: 1980/11/06, 43 y.o.   MRN: [id number]  Chief Complaint  Patient presents with   Extremity Weakness    Has been having trouble walking.     Facial Droop    HPI Patient is in today for complaint of right-sided facial droop and headache since this morning.  She also reports right upper extremity tremors and bilateral lower extremity weakness since this morning.  Her speech has been more slurred than her baseline.  Of note, she has a history of functional neurologic disorder, currently followed by [facility] neurology.  She was baseline last evening, but reports the symptoms since waking up this week.  She has not been able to walk due to bilateral extremity weakness.  She had history of TIA in the past, but is not on aspirin  or statin currently.  She is in wheelchair today, accompanied by her mother.  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   TUBAL LIGATION      Family History  Problem Relation Age of Onset   Hypertension Mother    Heart disease Maternal Aunt        murmur   Heart attack Maternal Uncle    Heart disease Maternal Uncle        murmur   Heart disease Maternal Grandmother    Heart Problems Maternal Grandmother    COPD Maternal Grandfather    Cancer Maternal Grandfather        lung   Migraines Neg Hx     Social History    Socioeconomic History   Marital status: Married    Spouse name: Dorn   Number of children: 3   Years of education: 12   Highest education level: Not on file  Occupational History   Occupation: Bakery assoc    Comment: [employer]  Tobacco Use   Smoking status: Never   Smokeless tobacco: Never  Vaping Use   Vaping status: Never Used  Substance and Sexual Activity   Alcohol use: No   Drug use: No   Sexual activity: Not on file    Comment: tubal  Other Topics Concern   Not on file  Social History Narrative   Lives with husband and 3 children   Right-handed   Caffeine : rare   Social Drivers of Health   Financial Resource Strain: Medium Risk (05/13/2023)   Received from [facility]   Overall Financial Resource Strain (CARDIA)    Difficulty of Paying Living Expenses: Somewhat hard  Food Insecurity: No Food Insecurity (05/20/2023)   Hunger Vital Sign    Worried About Running Out of Food in the Last Year: Never true    Ran Out of Food in the Last Year: Never true  Recent Concern: Food Insecurity - Food Insecurity Present (05/13/2023)   [facility]   Hunger Vital Sign    Within the past 12 months, you worried that your food would run out before you got the money to buy more.: Sometimes true    Within the past 12 months, the food you bought just didn't last and you didn't have money to get more.: Sometimes true  Transportation Needs: No Transportation Needs (05/20/2023)   PRAPARE - Administrator, Civil Service (Medical): No    Lack of Transportation (Non-Medical): No  Physical Activity: Inactive (09/25/2022)   Exercise Vital Sign    Days of Exercise per Week: 0 days    Minutes of Exercise per Session: 0 min  Stress: Stress Concern Present (03/30/2023)   [employer] of Occupational Health - Occupational Stress Questionnaire    Feeling of Stress : Rather much  Social Connections: Moderately Integrated  (11/25/2022)   Social Connection and Isolation Panel    Frequency of Communication with Friends and Family: More than three times a week    Frequency of Social Gatherings with Friends and Family: More than three times a week    Attends Religious Services: 1 to 4 times per year    Active Member of [employer] or Organizations: No    Attends Banker Meetings: Never    Marital Status: Married  Catering Manager Violence: Not At Risk (05/20/2023)   Humiliation, Afraid, Rape, and Kick questionnaire    Fear of Current or Ex-Partner: No    Emotionally Abused: No    Physically Abused: No    Sexually Abused: No    Outpatient Medications Prior to Visit  Medication Sig Dispense Refill   butalbital -acetaminophen -caffeine  (FIORICET) 50-325-40 MG tablet Take one tablet by mouth twice daily , as needed, for uncontrolled headache, maximum twice weekly 16 tablet 3   pantoprazole  (PROTONIX ) 40 MG tablet Take 1 tablet (40 mg total) by mouth daily. 30 tablet 3   topiramate  (TOPAMAX ) 25 MG tablet Take 1 tablet (25 mg total) by mouth 2 (two) times daily. 60 tablet 4   Vitamin D , Ergocalciferol , (DRISDOL ) 1.25 MG (50000 UNIT) CAPS capsule Take 1 capsule (50,000 Units total) by mouth every 7 (seven) days. 15 capsule 1   No facility-administered medications prior to visit.    Allergies  Allergen Reactions   Triptans     Cannot use triptans for migraine per neurology due to stroke history   Morphine And Codeine Hives    Feels like skin is burning     Review of Systems  Constitutional:  Negative for chills and fever.  HENT:  Negative for congestion and sore throat.   Eyes:  Negative for pain and discharge.  Respiratory:  Negative for cough and shortness of breath.   Cardiovascular:  Negative for chest pain and palpitations.  Gastrointestinal:  Negative for abdominal pain, diarrhea, nausea and vomiting.  Endocrine: Negative for polydipsia and polyuria.  Genitourinary:  Negative for dysuria and  hematuria.  Musculoskeletal:  Negative for neck pain and neck stiffness.  Skin:  Negative for rash.  Neurological:  Positive for tremors, facial asymmetry, weakness and numbness.  Psychiatric/Behavioral:  Negative for agitation and behavioral problems.        Objective:    Physical Exam Vitals reviewed.  Constitutional:      General: She is not in acute distress.    Appearance: She is not diaphoretic.  HENT:     Head: Normocephalic and atraumatic.     Nose: Nose normal.     Mouth/Throat:  Mouth: Mucous membranes are moist.  Eyes:     General: No scleral icterus.    Extraocular Movements: Extraocular movements intact.  Cardiovascular:     Rate and Rhythm: Normal rate and regular rhythm.     Heart sounds: Normal heart sounds. No murmur heard. Pulmonary:     Breath sounds: Normal breath sounds. No wheezing or rales.  Abdominal:     Palpations: Abdomen is soft.  Musculoskeletal:     Cervical back: Neck supple. No tenderness.     Right lower leg: No edema.     Left lower leg: No edema.  Skin:    General: Skin is warm.     Findings: No rash.  Neurological:     General: No focal deficit present.     Mental Status: She is alert and oriented to person, place, and time.     Sensory: Sensory deficit present.     Motor: Weakness (B/l LE - 2/5) and tremor (RUE) present.     Gait: Gait abnormal.     Comments: Left-sided facial droop  Psychiatric:        Mood and Affect: Mood normal.        Behavior: Behavior normal.     BP 125/77   Pulse (!) 110   Ht 5' 2 (1.575 m)   Wt 181 lb 9.6 oz (82.4 kg)   SpO2 96%   BMI 33.22 kg/m  Wt Readings from Last 3 Encounters:  11/16/23 181 lb 9.6 oz (82.4 kg)  11/16/23 181 lb 9.6 oz (82.4 kg)  09/02/23 184 lb 1.9 oz (83.5 kg)        Assessment & Plan:   Problem List Items Addressed This Visit       Cardiovascular and Mediastinum   Common migraine with intractable migraine   Takes Fioricet as needed for migraine, but did not  improve her symptoms today Was recently placed on topiramate , but unclear about compliance Followed by [name] neurology        Other   History of CVA (cerebrovascular accident) (Chronic)   Her current symptoms are concerning for acute CVA Referred to ER for stroke protocol evaluation - discussed with ER physician      Functional neurological symptom disorder with mixed symptoms - Primary   Followed by [name] neurology Unclear if her current symptoms are related to functional neurological disorder versus acute CVA Considering her history of TIA, referred to ER for further evaluation        No orders of the defined types were placed in this encounter.    Suzzane MARLA Blanch, MD

## 2023-11-16 NOTE — Assessment & Plan Note (Signed)
 Takes Fioricet as needed for migraine, but did not improve her symptoms today Was recently placed on topiramate , but unclear about compliance Followed by [facility] neurology

## 2023-11-16 NOTE — ED Triage Notes (Signed)
 Pt arrived via POV following a visit with Dr Tobie and presents for evaluation of possible CVA. Pt reports LKW was last night. Pt discovered symptoms this morning at 0600. Pt presents with delayed response, and reports she can't move her legs. Pt also reports Hx of CVA.

## 2023-11-16 NOTE — Patient Instructions (Signed)
 You are being referred to Emergency room for evaluation of CVA.

## 2023-11-16 NOTE — Discharge Instructions (Signed)
 Your symptoms improved after medicines in the emergency department. CT scan of the head and neck did not show any concerning findings. Follow-up with your primary care doctor.  Return for any emergent symptoms.

## 2023-11-16 NOTE — Assessment & Plan Note (Signed)
 Her current symptoms are concerning for acute CVA Referred to ER for stroke protocol evaluation - discussed with ER physician

## 2023-11-16 NOTE — Assessment & Plan Note (Addendum)
 Followed by [name] neurology Unclear if her current symptoms are related to functional neurological disorder versus acute CVA Considering her history of TIA, referred to ER for further evaluation

## 2023-11-16 NOTE — ED Provider Notes (Cosign Needed Addendum)
 [place] [facility]ERGENCY DEPARTMENT AT [facility] Provider Note   CSN: [id number] Arrival date & time: 11/16/23  1507     Patient presents with: Cerebrovascular Accident   Marissa [name] is a 43 y.o. female.   44 year old female with past medical history significant for TIA presents from neurology office for concern of CVA.  Last known normal was around midnight last night.  She woke up this morning with severe headache that has not improved despite taking her medications.  She has also had speech issue, weakness in her extremities to the point she was unable to stand earlier.  Her speech is slow.  None of the symptoms were present yesterday.  She does have a facial droop as well.  She states she has a functional disorder that will occasionally lead to the symptoms but when she takes her headache medications they typically improve.  The history is provided by the patient. No language interpreter was used.       Prior to Admission medications   Medication Sig Start Date End Date Taking? Authorizing Provider  butalbital -acetaminophen -caffeine  (FIORICET) 50-325-40 MG tablet Take one tablet by mouth twice daily , as needed, for uncontrolled headache, maximum twice weekly 07/14/23   Marissa Rollene BRAVO, MD  pantoprazole  (PROTONIX ) 40 MG tablet Take 1 tablet (40 mg total) by mouth daily. 09/04/23   Marissa Rollene BRAVO, MD  topiramate  (TOPAMAX ) 25 MG tablet Take 1 tablet (25 mg total) by mouth 2 (two) times daily. 09/02/23   Marissa Rollene BRAVO, MD  Vitamin D , Ergocalciferol , (DRISDOL ) 1.25 MG (50000 UNIT) CAPS capsule Take 1 capsule (50,000 Units total) by mouth every 7 (seven) days. 07/14/23   Marissa Rollene BRAVO, MD    Allergies: Triptans and Morphine and codeine    Review of Systems  Constitutional:  Negative for chills and fever.  Neurological:  Positive for facial asymmetry, speech difficulty and weakness. Negative for numbness.  All other systems reviewed and are  negative.   Updated Vital Signs Ht 5' 2 (1.575 m)   Wt 82.4 kg   BMI 33.21 kg/m   Physical Exam Vitals and nursing note reviewed.  Constitutional:      General: She is not in acute distress.    Appearance: Normal appearance. She is not ill-appearing.  HENT:     Head: Normocephalic and atraumatic.     Nose: Nose normal.  Eyes:     Conjunctiva/sclera: Conjunctivae normal.  Cardiovascular:     Rate and Rhythm: Normal rate and regular rhythm.  Pulmonary:     Effort: Pulmonary effort is normal. No respiratory distress.  Musculoskeletal:        General: No deformity.  Skin:    Findings: No rash.  Neurological:     Mental Status: She is alert.     Comments: Facial droop noted.  Slurred speech.  Finger-to-nose equally slow on both sides.  Difficulty raising either of her lower extremity.  When attempting pronator drift her right arm drifted upwards.  Sensation intact and symmetrical bilateral lower extremities and upper extremities as well as face.     (all labs ordered are listed, but only abnormal results are displayed) Labs Reviewed  PROTIME-INR  APTT  CBC  DIFFERENTIAL  COMPREHENSIVE METABOLIC PANEL WITH GFR  ETHANOL  URINE DRUG SCREEN  I-STAT CHEM 8, ED  POC URINE PREG, ED    EKG: None  Radiology: No results found.   .Critical Care  Performed by: Hildegard Loge, PA-C Authorized by: Hildegard Loge, PA-C  Critical care provider statement:    Critical care time (minutes):  30   Critical care was necessary to treat or prevent imminent or life-threatening deterioration of the following conditions:  CNS failure or compromise (code stroke)   Critical care was time spent personally by me on the following activities:  Development of treatment plan with patient or surrogate, discussions with consultants, evaluation of patient's response to treatment, examination of patient, ordering and review of laboratory studies, ordering and review of radiographic studies, ordering and  performing treatments and interventions, pulse oximetry, re-evaluation of patient's condition and review of old charts   Care discussed with comment:  Neurology    Medications Ordered in the ED - No data to display  Clinical Course as of 11/16/23 1739  Mon Nov 16, 2023  1630 Neurologist has spoken with attending.  They recommended migraine cocktail given CT imaging does not show any concerning findings.  They also states she has a functional neurological disorder which presents with similar symptoms.  If her symptoms not improved with migraine cocktail they recommend obtaining an MRI.  However they have low suspicion for stroke given her history. [AA]  [id number] Patient feels improved after migraine cocktail.  She is requesting something to eat and would like to be discharged.  Speech is better on reevaluation. [AA]    Clinical Course User Index [AA] Hildegard Loge, PA-C                                 Medical Decision Making Amount and/or Complexity of Data Reviewed Labs: ordered. Radiology: ordered.  Risk Prescription drug management.   Medical Decision Making / ED Course   This patient presents to the ED for concern of facial droop, speech delay, weakness, this involves an extensive number of treatment options, and is a complaint that carries with it a high risk of complications and morbidity.  The differential diagnosis includes CVA, TIA  MDM: 43 year old female presents today for above-mentioned complaints.  She does have facial droop, speech delay. Code stroke activated. Labs and CT head, CT angio head and neck ordered.  Patient improved after migraine cocktail.  CBC, CMP unremarkable.  CT angio head and neck, CT head without contrast without acute concerns.  EKG without acute ischemic change.  Patient is ready for discharge.  Discharged in stable condition.  Return precautions discussed.   Additional history obtained: -Additional history obtained from mom at bedside -External  records from outside source obtained and reviewed including: Chart review including previous notes, labs, imaging, consultation notes   Lab Tests: -I ordered, reviewed, and interpreted labs.   The pertinent results include:   Labs Reviewed  PROTIME-INR  APTT  CBC  DIFFERENTIAL  COMPREHENSIVE METABOLIC PANEL WITH GFR  ETHANOL  URINE DRUG SCREEN  CBG MONITORING, ED  I-STAT CHEM 8, ED  POC URINE PREG, ED      EKG  EKG Interpretation Date/Time:    Ventricular Rate:    PR Interval:    QRS Duration:    QT Interval:    QTC Calculation:   R Axis:      Text Interpretation:           Imaging Studies ordered: I ordered imaging studies including CT head without contrast, CT angio head and neck with and without contrast I independently visualized and interpreted imaging. I agree with the radiologist interpretation   Medicines ordered and prescription drug management: No orders of  the defined types were placed in this encounter.   -I have reviewed the patients home medicines and have made adjustments as needed  Critical interventions Code stroke  Consultations Obtained: I requested consultation with the neurology (Dr. Germaine),  and discussed lab and imaging findings as well as pertinent plan - they recommend: As above   Cardiac Monitoring: The patient was maintained on a cardiac monitor.  I personally viewed and interpreted the cardiac monitored which showed an underlying rhythm of: NSR   Reevaluation: After the interventions noted above, I reevaluated the patient and found that they have :resolved  Co morbidities that complicate the patient evaluation  Past Medical History:  Diagnosis Date   Allergy    seasonal   Anxiety    Back pain with right-sided radiculopathy 12/12/2020   Cerebrovascular accident (CVA) due to embolism (HCC) 10/08/2015   R  frontal   Common migraine with intractable migraine 11/08/2015   Encounter for examination following treatment  at hospital 03/03/2022   Encounter for gynecological examination with Papanicolaou smear of cervix 12/30/2018   Hyperlipidemia    Stroke [facility])    Vitamin D  deficiency       Dispostion: Discharged in stable condition.  Return precaution discussed.  Patient voices understanding and is in agreement with plan.  Final diagnoses:  Weakness  Functional neurological symptom disorder with mixed symptoms    ED Discharge Orders     None          Hildegard Loge, DEVONNA 11/16/23 1741    Marissa Clarity, MD 11/16/23 ROSENA Hildegard, Loge, PA-C 11/16/23 2312    Marissa Clarity, MD 11/17/23 1556

## 2023-11-16 NOTE — ED Notes (Signed)
CODE STROKE activated at this time.

## 2023-11-16 NOTE — ED Notes (Signed)
 Not able to use Istats since machines are down.

## 2023-11-16 NOTE — Consult Note (Addendum)
 [facility] Telemedicine Consult   Requesting Provider: Dean Consult Participants: nurse Location of the provider: [facility] Location of the patient: [facility]  This consult was provided via telemedicine with 2-way video and audio communication. The patient/family was informed that care would be provided in this way and agreed to receive care in this manner.    Chief Complaint: Difficulty walking, facial droop and difficulty with speech  HPI: 43 year old female with a history of PFO s/p closure, migraines, reported history of stroke in 2017, S1 radiculopathy, and generalized anxiety who presents with facial droop, difficulty walking and difficulty with speech.  Patient has had history of migraine with functional disorder.  Per patient symptoms usually resolve over the course of hours after her medication is taken and her headache is relieved.  Today patient awakened with usual symptomatolgy but has not responded to medications as usual.  Patient sent from primary provider office for evaluation.      LKW: 0000 on 11/16/2023 tpa given?: No, Outside time window IR Thrombectomy? No, No target lesion identified Modified Rankin Scale: 1 Time of teleneurologist evaluation: 1532  Exam: Vitals:   11/16/23 1520 11/16/23 1530  BP: (!) 132/93 137/88  Pulse: 90 89  Resp:  17  SpO2: 98% 99%    General: NAD  1A: Level of Consciousness - 0 1B: Ask Month and Age - 0 1C: 'Blink Eyes' & 'Squeeze Hands' - 0 2: Test Horizontal Extraocular Movements - 0 3: Test Visual Fields - 0 4: Test Facial Palsy - 1 5A: Test Left Arm Motor Drift - 0 5B: Test Right Arm Motor Drift - 0 6A: Test Left Leg Motor Drift - 2 6B: Test Right Leg Motor Drift - 2 7: Test Limb Ataxia - 0 8: Test Sensation - 0 9: Test Language/Aphasia- 1 10: Test Dysarthria - 1 11: Test Extinction/Inattention - 0 NIHSS score: 7   Imaging Reviewed:  CT HEAD WITHOUT CONTRAST 11/16/2023 03:52:05 PM   TECHNIQUE: CT of the  head was performed without the administration of intravenous contrast. Automated exposure control, iterative reconstruction, and/or weight based adjustment of the mA/kV was utilized to reduce the radiation dose to as low as reasonably achievable.   COMPARISON: 41024   CLINICAL HISTORY: Neuro deficit, acute, stroke suspected. Neuro deficit, acute, stroke suspected.   FINDINGS:   BRAIN AND VENTRICLES: No acute hemorrhage. No evidence of acute infarct. No hydrocephalus. No extra-axial collection. No mass effect or midline shift.   [place]ta Stroke Program Early CT (ASPECT) Score: Ganglionic (caudate, internal capsule, lentiform nucleus, insula, M1-M3): 7 Supraganglionic (M4-M6): 3 Total: 10   ORBITS: No acute abnormality.   SINUSES: Near complete opacification of left frontal sinus and left ethmoid air cells.   SOFT TISSUES AND SKULL: No acute soft tissue abnormality. No skull fracture.   IMPRESSION: 1. No acute intracranial abnormality. 2. ASPECTS 10. 3. Findings discussed with Dr. Hildegard at 4:02PM on 11/16/23.  CTA Head and Neck with Intravenous Contrast. CT Head without Contrast.   CLINICAL HISTORY: Neuro deficit, acute, stroke suspected. Neuro deficit, acute, stroke suspected Neuro deficit, acute, stroke suspected. Neuro deficit, acute, stroke suspected   TECHNIQUE: Axial CTA images of the head and neck performed with intravenous contrast. MIP reconstructed images were created and reviewed. Axial computed tomography images of the head/brain performed without intravenous contrast.   Note: Per PQRS, the description of internal carotid artery percent stenosis, including 0 percent or normal exam, is based on North American Symptomatic Carotid Endarterectomy Trial (NASCET) criteria. Dose  reduction technique was used including one or more of the following: automated exposure control, adjustment of mA and kV according to patient size, and/or iterative reconstruction.    CONTRAST: Without and with; 75mL (iohexol (OMNIPAQUE) 350 MG/ML injection 75 mL IOHEXOL 350 MG/ML SOLN)   COMPARISON: None provided.   FINDINGS:   BRAIN: No acute intraparenchymal hemorrhage. No mass lesion. No CT evidence for acute territorial infarct. No midline shift or extra-axial collection.   VENTRICLES: No hydrocephalus.   ORBITS: The orbits are unremarkable.   SINUSES AND MASTOIDS: The paranasal sinuses and mastoid air cells are clear.   COMMON CAROTID ARTERIES: No significant stenosis. No dissection or occlusion.   INTERNAL CAROTID ARTERIES: No stenosis by NASCET criteria. No dissection or occlusion.   VERTEBRAL ARTERIES: No significant stenosis. No dissection or occlusion.   ANTERIOR CEREBRAL ARTERIES: No significant stenosis. No occlusion. No aneurysm.   MIDDLE CEREBRAL ARTERIES: No significant stenosis. No occlusion. No aneurysm.   POSTERIOR CEREBRAL ARTERIES: No significant stenosis. No occlusion. No aneurysm.   BASILAR ARTERY: No significant stenosis. No occlusion. No aneurysm.   SOFT TISSUES: No acute finding. No masses or lymphadenopathy.   BONES: No acute osseous abnormality.   IMPRESSION: 1. No large vessel occlusion. 2. No evidence of significant stenosis, aneurysmal dilatation, or dissection involving the arteries of the head and neck.    Labs reviewed in epic and pertinent values follow: CBG 72   Assessment: 43 year old female with a history of PFO s/p closure, FND, migraines, reported history of stroke in 2017, S1 radiculopathy, and generalized anxiety who presents with facial droop, difficulty walking and difficulty with speech.  NIHSS of 7.  Reports continued headache.  Head CT personally reviewed and shows no acute changes.  CTA of the head and neck personally reviewed and shows no evidence of LVO.  Suspect this is patient's presentation of FND with migraine.  Would treat migraine since home medications have not worked.     Recommendations:  Migraine cocktail.  If headache improves and there is no improvement in symptoms would obtain MRI of the brain without contrast. If MRI unremarkable, no further intervention recommended other than therapy.   ASA 300mg  rectally now    This patient is receiving care for possible acute neurological changes. Care by this provider at the time of service included time for direct evaluation via telemedicine, review of medical records, imaging studies and discussion of findings with providers, the patient and/or family.  Sonny Hock, MD Neurology   If 7pm- 7am, please page neurology on call as listed in AMION.

## 2023-11-18 ENCOUNTER — Ambulatory Visit (INDEPENDENT_AMBULATORY_CARE_PROVIDER_SITE_OTHER)

## 2023-11-18 DIAGNOSIS — E538 Deficiency of other specified B group vitamins: Secondary | ICD-10-CM

## 2023-11-18 MED ORDER — CYANOCOBALAMIN 1000 MCG/ML IJ SOLN
1000.0000 ug | Freq: Once | INTRAMUSCULAR | Status: AC
  Administered 2023-11-18: 1000 ug via INTRAMUSCULAR

## 2023-11-18 NOTE — Progress Notes (Signed)
 Patient is in office today for a nurse visit for B12 Injection. Patient Injection was given in the  Left deltoid. Patient tolerated injection well.

## 2023-11-20 ENCOUNTER — Ambulatory Visit: Payer: Self-pay | Admitting: Family Medicine

## 2023-11-23 ENCOUNTER — Encounter: Payer: Self-pay | Admitting: Radiology

## 2023-11-25 ENCOUNTER — Telehealth: Payer: Self-pay | Admitting: Family Medicine

## 2023-11-25 NOTE — Telephone Encounter (Signed)
 She has the B12 RX filled from pharmacy and she go this from [facility] reason missed she was sick she said suppose to get weekly.  Patient asking for provider to reach out to her. Patient said she is suppose to come in today and have this shot , she asked Dr Antonetta reach out to her [phone].

## 2023-11-26 ENCOUNTER — Other Ambulatory Visit: Payer: Self-pay

## 2023-11-26 DIAGNOSIS — N63 Unspecified lump in unspecified breast: Secondary | ICD-10-CM

## 2023-11-26 DIAGNOSIS — Z1231 Encounter for screening mammogram for malignant neoplasm of breast: Secondary | ICD-10-CM

## 2023-12-02 ENCOUNTER — Telehealth: Payer: Self-pay | Admitting: Family Medicine

## 2023-12-02 ENCOUNTER — Ambulatory Visit (INDEPENDENT_AMBULATORY_CARE_PROVIDER_SITE_OTHER)

## 2023-12-02 DIAGNOSIS — E538 Deficiency of other specified B group vitamins: Secondary | ICD-10-CM

## 2023-12-02 MED ORDER — CYANOCOBALAMIN 1000 MCG/ML IJ SOLN
1000.0000 ug | Freq: Once | INTRAMUSCULAR | Status: AC
  Administered 2023-12-02: 1000 ug via INTRAMUSCULAR

## 2023-12-02 NOTE — Progress Notes (Signed)
 Patient is in office today for a nurse visit for B12 Injection. Patient Injection was given in the  Left deltoid. Patient tolerated injection well.

## 2023-12-02 NOTE — Telephone Encounter (Signed)
 Please advise Copied from CRM [id number]. Topic: Clinical - Order For Equipment >> Dec 02, 2023 12:15 PM Selinda RAMAN wrote: Reason for CRM: Sonja Case Manager with the [employer] program called in because paperwork for the patients DME has been returned stating that the patient is not a patient at this location. She states the patient is listed under a different last name, the name of Myra but everything else is the same D.O.B., address and same primary care provider. She would like a call so this can be cleared up as the patient really needs the equipment. She last saw Dr Tobie on 10/27. Please assist patient further.Sonja can be reached at [phone]

## 2023-12-02 NOTE — Telephone Encounter (Signed)
 Noted. Will correct and complete form

## 2023-12-09 ENCOUNTER — Ambulatory Visit (INDEPENDENT_AMBULATORY_CARE_PROVIDER_SITE_OTHER)

## 2023-12-09 ENCOUNTER — Telehealth: Payer: Self-pay

## 2023-12-09 DIAGNOSIS — E538 Deficiency of other specified B group vitamins: Secondary | ICD-10-CM

## 2023-12-09 MED ORDER — CYANOCOBALAMIN 1000 MCG/ML IJ SOLN
1000.0000 ug | Freq: Once | INTRAMUSCULAR | Status: AC
  Administered 2023-12-09: 1000 ug via INTRAMUSCULAR

## 2023-12-09 NOTE — Progress Notes (Signed)
 Patient is in office today for a nurse visit for B12 Injection. Patient Injection was given in the  Right deltoid. Patient tolerated injection well.

## 2023-12-09 NOTE — Telephone Encounter (Signed)
 Pt came in for b12 injection. She has been getting b12 once a week since last week of September from [facility]. Pt is asking if we can order her b12 to be rechecked here by us  and start prescribing b12? Please advise

## 2023-12-23 ENCOUNTER — Ambulatory Visit

## 2024-02-02 ENCOUNTER — Encounter: Payer: Self-pay | Admitting: Family Medicine

## 2024-02-02 ENCOUNTER — Ambulatory Visit (INDEPENDENT_AMBULATORY_CARE_PROVIDER_SITE_OTHER): Admitting: Family Medicine

## 2024-02-02 VITALS — BP 136/88 | HR 77 | Resp 16 | Ht 62.0 in | Wt 180.0 lb

## 2024-02-02 DIAGNOSIS — F419 Anxiety disorder, unspecified: Secondary | ICD-10-CM

## 2024-02-02 DIAGNOSIS — Z01419 Encounter for gynecological examination (general) (routine) without abnormal findings: Secondary | ICD-10-CM

## 2024-02-02 DIAGNOSIS — F459 Somatoform disorder, unspecified: Secondary | ICD-10-CM

## 2024-02-02 DIAGNOSIS — Z13220 Encounter for screening for lipoid disorders: Secondary | ICD-10-CM

## 2024-02-02 DIAGNOSIS — E538 Deficiency of other specified B group vitamins: Secondary | ICD-10-CM | POA: Diagnosis not present

## 2024-02-02 DIAGNOSIS — M5410 Radiculopathy, site unspecified: Secondary | ICD-10-CM | POA: Diagnosis not present

## 2024-02-02 DIAGNOSIS — E66811 Obesity, class 1: Secondary | ICD-10-CM | POA: Diagnosis not present

## 2024-02-02 DIAGNOSIS — G43019 Migraine without aura, intractable, without status migrainosus: Secondary | ICD-10-CM | POA: Diagnosis not present

## 2024-02-02 DIAGNOSIS — F447 Conversion disorder with mixed symptom presentation: Secondary | ICD-10-CM

## 2024-02-02 DIAGNOSIS — R4689 Other symptoms and signs involving appearance and behavior: Secondary | ICD-10-CM

## 2024-02-02 DIAGNOSIS — E559 Vitamin D deficiency, unspecified: Secondary | ICD-10-CM | POA: Diagnosis not present

## 2024-02-02 MED ORDER — TOPIRAMATE 25 MG PO TABS: 25.0000 mg | ORAL_TABLET | Freq: Two times a day (BID) | ORAL | 4 refills | Status: AC

## 2024-02-02 MED ORDER — BUTALBITAL-APAP-CAFFEINE 50-325-40 MG PO TABS: ORAL_TABLET | ORAL | 3 refills | Status: AC

## 2024-02-02 NOTE — Patient Instructions (Addendum)
 F/u in 18 weeks  Labs today as ordered in 08/2023, pls add B12   Please schedule mammogram at checkout  Please get pap smear, call in for your appointment  Be safe  Thanks for choosing [facility], we consider it a privelige to serve you.

## 2024-02-03 ENCOUNTER — Ambulatory Visit: Payer: Self-pay | Admitting: Family Medicine

## 2024-02-03 LAB — LIPID PANEL
Chol/HDL Ratio: 2.8 ratio (ref 0.0–4.4)
Cholesterol, Total: 194 mg/dL (ref 100–199)
HDL: 69 mg/dL
LDL Chol Calc (NIH): 112 mg/dL — ABNORMAL HIGH (ref 0–99)
Triglycerides: 70 mg/dL (ref 0–149)
VLDL Cholesterol Cal: 13 mg/dL (ref 5–40)

## 2024-02-03 LAB — CBC WITH DIFFERENTIAL/PLATELET
Basophils Absolute: 0 x10E3/uL (ref 0.0–0.2)
Basos: 1 %
EOS (ABSOLUTE): 0 x10E3/uL (ref 0.0–0.4)
Eos: 1 %
Hematocrit: 38.2 % (ref 34.0–46.6)
Hemoglobin: 12.6 g/dL (ref 11.1–15.9)
Immature Grans (Abs): 0 x10E3/uL (ref 0.0–0.1)
Immature Granulocytes: 0 %
Lymphocytes Absolute: 2.2 x10E3/uL (ref 0.7–3.1)
Lymphs: 55 %
MCH: 31.7 pg (ref 26.6–33.0)
MCHC: 33 g/dL (ref 31.5–35.7)
MCV: 96 fL (ref 79–97)
Monocytes Absolute: 0.3 x10E3/uL (ref 0.1–0.9)
Monocytes: 6 %
Neutrophils Absolute: 1.5 x10E3/uL (ref 1.4–7.0)
Neutrophils: 37 %
Platelets: 253 x10E3/uL (ref 150–450)
RBC: 3.97 x10E6/uL (ref 3.77–5.28)
RDW: 12.7 % (ref 11.7–15.4)
WBC: 4.1 x10E3/uL (ref 3.4–10.8)

## 2024-02-03 LAB — VITAMIN D 25 HYDROXY (VIT D DEFICIENCY, FRACTURES): Vit D, 25-Hydroxy: 17.3 ng/mL — ABNORMAL LOW (ref 30.0–100.0)

## 2024-02-03 LAB — CMP14+EGFR
ALT: 16 IU/L (ref 0–32)
AST: 16 IU/L (ref 0–40)
Albumin: 4.3 g/dL (ref 3.9–4.9)
Alkaline Phosphatase: 56 IU/L (ref 41–116)
BUN/Creatinine Ratio: 13 (ref 9–23)
BUN: 11 mg/dL (ref 6–24)
Bilirubin Total: 0.5 mg/dL (ref 0.0–1.2)
CO2: 23 mmol/L (ref 20–29)
Calcium: 9.6 mg/dL (ref 8.7–10.2)
Chloride: 103 mmol/L (ref 96–106)
Creatinine, Ser: 0.83 mg/dL (ref 0.57–1.00)
Globulin, Total: 2.7 g/dL (ref 1.5–4.5)
Glucose: 81 mg/dL (ref 70–99)
Potassium: 4.1 mmol/L (ref 3.5–5.2)
Sodium: 138 mmol/L (ref 134–144)
Total Protein: 7 g/dL (ref 6.0–8.5)
eGFR: 90 mL/min/1.73

## 2024-02-03 LAB — TSH: TSH: 1.8 u[IU]/mL (ref 0.450–4.500)

## 2024-02-03 LAB — VITAMIN B12: Vitamin B-12: 517 pg/mL (ref 232–1245)

## 2024-02-03 MED ORDER — VITAMIN D (ERGOCALCIFEROL) 1.25 MG (50000 UNIT) PO CAPS: 50000.0000 [IU] | ORAL_CAPSULE | ORAL | 1 refills | Status: AC

## 2024-02-15 ENCOUNTER — Encounter: Payer: Self-pay | Admitting: Family Medicine

## 2024-02-15 NOTE — Assessment & Plan Note (Addendum)
 Still symptomatic, decided against spine surgery OTC pain med when needed

## 2024-02-15 NOTE — Assessment & Plan Note (Signed)
 Continues to be symptomatic, with recurrent episodes of debility and inability to be safely on her own or to function independently, continue to follow at [facility]

## 2024-02-15 NOTE — Assessment & Plan Note (Signed)
 Followed by [facility] continues to have episodes of acute weakness with expressive aphasia, no associated trigger, debilitating when they occur

## 2024-02-15 NOTE — Assessment & Plan Note (Signed)
 Witnessed in office on day of visit duration total being approox 30 mins

## 2024-02-15 NOTE — Assessment & Plan Note (Signed)
 Updated lab needed.

## 2024-02-15 NOTE — Progress Notes (Signed)
" ° °  Marissa [name]     MRN: [id number]      DOB: 16-Oct-1980  Chief Complaint  Patient presents with   Follow-up   Leg Pain    Pt having bilateral leg pain and weakness. Had episode coming into office and now not speaking much so I couldn't get more info    HPI Marissa [name] is here for follow up and re-evaluation of chronic medical conditions, medication management and review of any available recent lab and radiology data.  Preventive health is updated, specifically  Cancer screening and Immunization.   Continues to have recurrences of spells with jerking, inability to maintain balance , difficulty speaking, experiencing this after she entered the office on day of visit Refusing vaccines   ROS Denies recent fever or chills. Denies sinus pressure, nasal congestion, ear pain or sore throat. Denies chest congestion, productive cough or wheezing. Denies chest pains, palpitations and leg swelling  Denies skin break down or rash.   PE  BP 136/88   Pulse 77   Resp 16   Ht 5' 2 (1.575 m)   Wt 180 lb (81.6 kg)   SpO2 92%   BMI 32.92 kg/m   Patient alert , speech , difficulty with communication at time of visit and in no cardiopulmonary distress.  HEENT: No facial asymmetry, EOMI,     Neck supple .  Chest: Clear to auscultation bilaterally.  CVS: S1, S2 no murmurs, no S3.Regular rate.  ABD: Soft non tender.   Ext: No edema  Psych: Good eye contact, flat  affect. CNS: CN 2-12 intact, power,  normal throughout.no focal deficits noted.   Assessment & Plan  Somatoform disorder, unspecified Continues to be symptomatic, with recurrent episodes of debility and inability to be safely on her own or to function independently, continue to follow at [facility] of abnormal behavior Witnessed in office on day of visit duration total being approox 30 mins  Obesity (BMI 30.0-34.9)  Patient re-educated about  the importance of commitment to a  minimum of 150 minutes of exercise per  week as able.  The importance of healthy food choices with portion control discussed, as well as eating regularly and within a 12 hour window most days. The need to choose clean , green food 50 to 75% of the time is discussed, as well as to make water the primary drink and set a goal of 64 ounces water daily.       02/02/2024   10:55 AM 11/16/2023    3:14 PM 11/16/2023    1:57 PM  Weight /BMI  Weight 180 lb 181 lb 9.6 oz 181 lb 9.6 oz  Height 5' 2 (1.575 m) 5' 2 (1.575 m) 5' 2 (1.575 m)  BMI 32.92 kg/m2 33.21 kg/m2 33.22 kg/m2    Unchanged  B12 deficiency Check level  Anxiety Untreated , related to health primarily, would benefit from therapy but no interest in this currently  Vitamin D  deficiency Updated lab needed    Functional neurological symptom disorder with mixed symptoms Followed by [facility] continues to have episodes of acute weakness with expressive aphasia, no associated trigger, debilitating when they occur  Back pain with radiculopathy Still symptomatic, decided against spine surgery OTC pain med when needed  Common migraine with intractable migraine Intermittent ,uses fioricet for relief  "

## 2024-02-15 NOTE — Assessment & Plan Note (Signed)
"   Check level  "

## 2024-02-15 NOTE — Assessment & Plan Note (Signed)
 Untreated , related to health primarily, would benefit from therapy but no interest in this currently

## 2024-02-15 NOTE — Assessment & Plan Note (Signed)
" °  Patient re-educated about  the importance of commitment to a  minimum of 150 minutes of exercise per week as able.  The importance of healthy food choices with portion control discussed, as well as eating regularly and within a 12 hour window most days. The need to choose clean , green food 50 to 75% of the time is discussed, as well as to make water the primary drink and set a goal of 64 ounces water daily.       02/02/2024   10:55 AM 11/16/2023    3:14 PM 11/16/2023    1:57 PM  Weight /BMI  Weight 180 lb 181 lb 9.6 oz 181 lb 9.6 oz  Height 5' 2 (1.575 m) 5' 2 (1.575 m) 5' 2 (1.575 m)  BMI 32.92 kg/m2 33.21 kg/m2 33.22 kg/m2    Unchanged "

## 2024-02-15 NOTE — Assessment & Plan Note (Signed)
 Intermittent ,uses fioricet for relief

## 2024-02-16 ENCOUNTER — Telehealth: Payer: Self-pay

## 2024-02-16 NOTE — Telephone Encounter (Signed)
 Faxed [facility] visit [facility] to [facility] for wheelchair, per pt mother request

## 2024-02-16 NOTE — Telephone Encounter (Signed)
 Copied from CRM #[id number]. Topic: Clinical - Order For Equipment >> Feb 16, 2024 12:16 PM Larissa RAMAN wrote: Reason for CRM: Patient's mother requesting a callback from nurse/PCP regarding wheelchair. Requesting a callback as soon as possible.  Please call [phone]

## 2024-02-17 ENCOUNTER — Telehealth: Payer: Self-pay

## 2024-02-17 NOTE — Telephone Encounter (Signed)
 This has already been sent. Will refax again today

## 2024-02-17 NOTE — Telephone Encounter (Signed)
 Copied from CRM #[id number]. Topic: General - Other >> Feb 17, 2024 11:45 AM Tiffini S wrote: Reason for CRM: Macario with [facility] [phone] option # 7 called about a prescription for a transport chair- received prior auth form and need the doctor notes supporting why the chair is needed   Fax a request on 12/08/23 and 02/12/24 for doctor notes- patient mother is asking the case manager why this have not been approved   Please fax the notes to [phone]

## 2024-02-24 ENCOUNTER — Ambulatory Visit
Admission: EM | Admit: 2024-02-24 | Discharge: 2024-02-24 | Disposition: A | Discharge status: Home / Self Care | Source: Home / Self Care

## 2024-02-24 ENCOUNTER — Encounter: Payer: Self-pay | Admitting: Emergency Medicine

## 2024-02-24 ENCOUNTER — Other Ambulatory Visit: Payer: Self-pay

## 2024-02-24 DIAGNOSIS — L249 Irritant contact dermatitis, unspecified cause: Secondary | ICD-10-CM | POA: Diagnosis not present

## 2024-02-24 MED ORDER — TRIAMCINOLONE ACETONIDE 0.1 % EX OINT: 1.0000 | TOPICAL_OINTMENT | Freq: Two times a day (BID) | CUTANEOUS | 0 refills | Status: AC

## 2024-02-24 MED ORDER — TRIAMCINOLONE ACETONIDE 0.1 % EX OINT: TOPICAL_OINTMENT | Freq: Two times a day (BID) | CUTANEOUS | Status: DC

## 2024-02-24 NOTE — ED Triage Notes (Signed)
 Pt reports itching sensation to bilateral hands x1 month. Pt denies any new self care products or other symptoms. pt was seen at other UC and reports was px hydrocortisone cream with no change in symptoms.

## 2024-02-24 NOTE — Discharge Instructions (Addendum)
 Use triamcinolone  ointment to the hands every 12 hours for 7 days. Wear gloves when washing dishes.  Avoid exposing hands to water for long periods of time. Pat hands dry immediately after washing them and apply Aquaphor ointment. Apply Aquaphor ointment in between uses of triamcinolone . Do not use the triamcinolone  steroid ointment for longer than 10 days, use Aquaphor to the hands for maintenance.  Follow-up with your PCP to discuss ongoing management of symptoms. If you notice signs of infection such as redness, swelling, pus, or pain, return to clinic.  Avoid touching the hands is much as possible.

## 2024-02-24 NOTE — ED Provider Notes (Signed)
 " [facility]    CSN: [id number] Arrival date & time: 02/24/24  1407      History   Chief Complaint Chief Complaint  Patient presents with   Skin Problem    HPI Marissa [name] is a 44 y.o. female.   Marissa [name] is a 44 y.o. female presenting for chief complaint of bilateral hand rash that started about a month ago. Rash is patchy, itchy, red, and raised with bumps to the dorsum and interdigital spaces of the hands. Rash worsens and burns when she has her hands in water for long periods of time. She washes dishes without dish gloves multiple (5) times throughout the day and states this worsens rash. Denies recent contacts with similar rash, history of atopic dermatitis, pus/drainage from the lesions, and exposure to known/potential allergens or new personal hygiene products. She was prescribed low potency hydrocortisone cream last month and has been using this as well as Aquaphor/Vaseline to the rash without much relief.      Past Medical History:  Diagnosis Date   Allergy    seasonal   Anxiety    Back pain with right-sided radiculopathy 12/12/2020   Cerebrovascular accident (CVA) due to embolism (HCC) 10/08/2015   R  frontal   Common migraine with intractable migraine 11/08/2015   Encounter for examination following treatment at hospital 03/03/2022   Encounter for gynecological examination with Papanicolaou smear of cervix 12/30/2018   Hyperlipidemia    Stroke [facility])    Vitamin D  deficiency     Patient Active Problem List   Diagnosis Date Noted   B12 deficiency 10/21/2023   Gastroesophageal reflux disease with esophagitis without hemorrhage 09/30/2023   Acute neck pain 12/26/2022   Somatoform disorder, 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   H/O Amplatzer atrial septal defect closure 03/25/2022   Depression, major, single episode,  moderate (HCC) 03/16/2022   Allergic rhinitis 03/16/2022   Abnormal EKG 03/03/2022   Encounter for examination following treatment at hospital 03/03/2022   Abnormal MRA, brain 10/25/2021   Back pain with radiculopathy 12/12/2020   Neurological disorder 09/07/2020   Speech disturbance 09/03/2020   Spell of abnormal behavior    Abnormal MRI of head    TIA (transient ischemic attack) 08/13/2020   History of CVA (cerebrovascular accident) 12/09/2018   Anxiety 12/09/2018   Obesity (BMI 30.0-34.9) 12/09/2018   Common migraine with intractable migraine 11/08/2015   PFO (patent foramen ovale) 10/08/2015   Vitamin D  deficiency 10/08/2015    Past Surgical History:  Procedure Laterality Date   CESAREAN SECTION     PATENT FORAMEN OVALE CLOSURE  09/25/2015   TUBAL LIGATION      OB History     Gravida  3   Para  3   Term  2   Preterm  1   AB      Living  3      SAB      IAB      Ectopic      Multiple      Live Births               Home Medications    Prior to Admission medications  Medication Sig Start Date End Date Taking? Authorizing Provider  triamcinolone  ointment (KENALOG ) 0.1 % Apply 1 Application topically 2 (two) times daily. 02/24/24  Yes Enedelia Dorna CHRISTELLA, FNP  butalbital -acetaminophen -caffeine  (FIORICET) 50-325-40 MG tablet  Take one tablet by mouth twice daily , as needed, for uncontrolled headache, maximum twice weekly 02/02/24   [name], MD  pantoprazole  (PROTONIX ) 40 MG tablet Take 1 tablet (40 mg total) by mouth daily. 09/04/23   [name], MD  topiramate  (TOPAMAX ) 25 MG tablet Take 1 tablet (25 mg total) by mouth 2 (two) times daily. 02/02/24   [name], MD  Vitamin D , Ergocalciferol , (DRISDOL ) 1.25 MG (50000 UNIT) CAPS capsule Take 1 capsule (50,000 Units total) by mouth every 7 (seven) days. 02/03/24   [name], MD    Family History Family History  Problem Relation Age of Onset   Hypertension Mother     Heart disease Maternal Aunt        murmur   Heart attack Maternal Uncle    Heart disease Maternal Uncle        murmur   Heart disease Maternal Grandmother    Heart Problems Maternal Grandmother    COPD Maternal Grandfather    Cancer Maternal Grandfather        lung   Migraines Neg Hx     Social History Social History[1]   Allergies   Triptans and Morphine and codeine   Review of Systems Review of Systems Per HPI  Physical Exam Triage Vital Signs ED Triage Vitals  Encounter Vitals Group     BP 02/24/24 1423 122/73     Girls Systolic BP Percentile --      Girls Diastolic BP Percentile --      Boys Systolic BP Percentile --      Boys Diastolic BP Percentile --      Pulse Rate 02/24/24 1423 83     Resp 02/24/24 1423 20     Temp 02/24/24 1423 98.9 F (37.2 C)     Temp Source 02/24/24 1423 Oral     SpO2 02/24/24 1423 99 %     Weight --      Height --      Head Circumference --      Peak Flow --      Pain Score 02/24/24 1426 0     Pain Loc --      Pain Education --      Exclude from Growth Chart --    No data found.  Updated Vital Signs BP 122/73 (BP Location: Right Arm)   Pulse 83   Temp 98.9 F (37.2 C) (Oral)   Resp 20   LMP 01/25/2024 (Approximate)   SpO2 99%   Visual Acuity Right Eye Distance:   Left Eye Distance:   Bilateral Distance:    Right Eye Near:   Left Eye Near:    Bilateral Near:     Physical Exam Vitals and nursing note reviewed.  Constitutional:      Appearance: She is not ill-appearing or toxic-appearing.  HENT:     Head: Normocephalic and atraumatic.     Right Ear: Hearing and external ear normal.     Left Ear: Hearing and external ear normal.     Nose: Nose normal.     Mouth/Throat:     Lips: Pink.  Eyes:     General: Lids are normal. Vision grossly intact. Gaze aligned appropriately.     Extraocular Movements: Extraocular movements intact.     Conjunctiva/sclera: Conjunctivae normal.  Pulmonary:     Effort:  Pulmonary effort is normal.  Musculoskeletal:     Cervical back: Neck supple.  Skin:    General: Skin  is warm and dry.     Capillary Refill: Capillary refill takes less than 2 seconds.     Findings: Rash (Patchy maculopapular fleshtoned lesions on a raised erythematous base to the dorsum of the hands and the interdigital spaces of the fingers.) present.     Comments: Lesions are non-draining. No warmth or tenderness. +2 bilateral radial pulses with less than 2 cap refill.   Neurological:     General: No focal deficit present.     Mental Status: She is alert and oriented to person, place, and time. Mental status is at baseline.     Cranial Nerves: No dysarthria or facial asymmetry.  Psychiatric:        Mood and Affect: Mood normal.        Speech: Speech normal.        Behavior: Behavior normal.        Thought Content: Thought content normal.        Judgment: Judgment normal.      UC Treatments / Results  Labs (all labs ordered are listed, but only abnormal results are displayed) Labs Reviewed - No data to display  EKG   Radiology No results found.  Procedures Procedures (including critical care time)  Medications Ordered in UC Medications - No data to display  Initial Impression / Assessment and Plan / UC Course  I have reviewed the triage vital signs and the nursing notes.  Pertinent labs & imaging results that were available during my care of the patient were reviewed by me and considered in my medical decision making (see chart for details).   1. Irritant hand dermatitis Presentation consistent with irritant dermatitis of the hands.  Doubt scabies, though this differential has been considered given location of rash. No other lesions to the rest of the body, rash is localized to the hands. Treatment with higher potency topical steroid ordered. Triamcinolone  BID for up to 10 days. Recommend use of Aquaphor for maintenance and inbetween doses of triamcinolone . Advised  use of dish gloves and avoiding prolonged exposure to water.  No signs of secondary bacterial infection today, infection precautions discussed. Follow-up with PCP PRN.   Counseled patient on potential for adverse effects with medications prescribed/recommended today, strict ER and return-to-clinic precautions discussed, patient verbalized understanding.    Final Clinical Impressions(s) / UC Diagnoses   Final diagnoses:  Irritant hand dermatitis     Discharge Instructions      Use triamcinolone  ointment to the hands every 12 hours for 7 days. Wear gloves when washing dishes.  Avoid exposing hands to water for long periods of time. Pat hands dry immediately after washing them and apply Aquaphor ointment. Apply Aquaphor ointment in between uses of triamcinolone . Do not use the triamcinolone  steroid ointment for longer than 10 days, use Aquaphor to the hands for maintenance.  Follow-up with your PCP to discuss ongoing management of symptoms. If you notice signs of infection such as redness, swelling, pus, or pain, return to clinic.  Avoid touching the hands is much as possible.   ED Prescriptions     Medication Sig Dispense Auth. Provider   triamcinolone  ointment (KENALOG ) 0.1 % Apply 1 Application topically 2 (two) times daily. 30 g Enedelia Dorna HERO, FNP      PDMP not reviewed this encounter.    [1]  Social History Tobacco Use   Smoking status: Never   Smokeless tobacco: Never  Vaping Use   Vaping status: Never Used  Substance Use Topics   Alcohol  use: No   Drug use: No     Enedelia Dorna HERO, FNP 02/24/24 1512  "

## 2024-03-09 ENCOUNTER — Encounter: Admitting: Women's Health

## 2024-04-05 ENCOUNTER — Other Ambulatory Visit (HOSPITAL_COMMUNITY)

## 2024-04-05 ENCOUNTER — Encounter (HOSPITAL_COMMUNITY)

## 2024-06-08 ENCOUNTER — Ambulatory Visit: Payer: Self-pay | Admitting: Family Medicine
